# Patient Record
Sex: Female | Born: 1955 | Race: White | Hispanic: No | Marital: Married | Smoking: Never smoker
Health system: Southern US, Community
[De-identification: ages and names within clinical notes are randomized; demographics above are authoritative.]

## PROBLEM LIST (undated history)

## (undated) DIAGNOSIS — K635 Polyp of colon: Secondary | ICD-10-CM

## (undated) DIAGNOSIS — G4730 Sleep apnea, unspecified: Secondary | ICD-10-CM

## (undated) DIAGNOSIS — M5124 Other intervertebral disc displacement, thoracic region: Secondary | ICD-10-CM

## (undated) DIAGNOSIS — J40 Bronchitis, not specified as acute or chronic: Secondary | ICD-10-CM

## (undated) DIAGNOSIS — J069 Acute upper respiratory infection, unspecified: Secondary | ICD-10-CM

## (undated) DIAGNOSIS — K5900 Constipation, unspecified: Secondary | ICD-10-CM

## (undated) DIAGNOSIS — M5126 Other intervertebral disc displacement, lumbar region: Secondary | ICD-10-CM

## (undated) DIAGNOSIS — M549 Dorsalgia, unspecified: Secondary | ICD-10-CM

## (undated) DIAGNOSIS — K9041 Non-celiac gluten sensitivity: Secondary | ICD-10-CM

## (undated) DIAGNOSIS — M7989 Other specified soft tissue disorders: Secondary | ICD-10-CM

## (undated) DIAGNOSIS — H8110 Benign paroxysmal vertigo, unspecified ear: Secondary | ICD-10-CM

## (undated) DIAGNOSIS — K589 Irritable bowel syndrome without diarrhea: Secondary | ICD-10-CM

## (undated) DIAGNOSIS — E559 Vitamin D deficiency, unspecified: Secondary | ICD-10-CM

## (undated) DIAGNOSIS — J45909 Unspecified asthma, uncomplicated: Secondary | ICD-10-CM

## (undated) DIAGNOSIS — B019 Varicella without complication: Secondary | ICD-10-CM

## (undated) DIAGNOSIS — K648 Other hemorrhoids: Secondary | ICD-10-CM

## (undated) DIAGNOSIS — M797 Fibromyalgia: Secondary | ICD-10-CM

## (undated) DIAGNOSIS — K5792 Diverticulitis of intestine, part unspecified, without perforation or abscess without bleeding: Secondary | ICD-10-CM

## (undated) DIAGNOSIS — E538 Deficiency of other specified B group vitamins: Secondary | ICD-10-CM

## (undated) DIAGNOSIS — K219 Gastro-esophageal reflux disease without esophagitis: Secondary | ICD-10-CM

## (undated) DIAGNOSIS — M2550 Pain in unspecified joint: Secondary | ICD-10-CM

## (undated) DIAGNOSIS — M1990 Unspecified osteoarthritis, unspecified site: Secondary | ICD-10-CM

## (undated) DIAGNOSIS — M8580 Other specified disorders of bone density and structure, unspecified site: Secondary | ICD-10-CM

## (undated) DIAGNOSIS — R61 Generalized hyperhidrosis: Secondary | ICD-10-CM

## (undated) DIAGNOSIS — I639 Cerebral infarction, unspecified: Secondary | ICD-10-CM

## (undated) DIAGNOSIS — R002 Palpitations: Secondary | ICD-10-CM

## (undated) DIAGNOSIS — E28319 Asymptomatic premature menopause: Secondary | ICD-10-CM

## (undated) DIAGNOSIS — T7840XA Allergy, unspecified, initial encounter: Secondary | ICD-10-CM

## (undated) HISTORY — DX: Other intervertebral disc displacement, thoracic region: M51.24

## (undated) HISTORY — DX: Other specified disorders of bone density and structure, unspecified site: M85.80

## (undated) HISTORY — DX: Other hemorrhoids: K64.8

## (undated) HISTORY — DX: Unspecified asthma, uncomplicated: J45.909

## (undated) HISTORY — DX: Constipation, unspecified: K59.00

## (undated) HISTORY — DX: Vitamin D deficiency, unspecified: E55.9

## (undated) HISTORY — DX: Polyp of colon: K63.5

## (undated) HISTORY — DX: Other intervertebral disc displacement, lumbar region: M51.26

## (undated) HISTORY — DX: Varicella without complication: B01.9

## (undated) HISTORY — DX: Asymptomatic premature menopause: E28.319

## (undated) HISTORY — DX: Gastro-esophageal reflux disease without esophagitis: K21.9

## (undated) HISTORY — DX: Acute upper respiratory infection, unspecified: J06.9

## (undated) HISTORY — DX: Allergy, unspecified, initial encounter: T78.40XA

## (undated) HISTORY — DX: Cerebral infarction, unspecified: I63.9

## (undated) HISTORY — DX: Irritable bowel syndrome, unspecified: K58.9

## (undated) HISTORY — DX: Palpitations: R00.2

## (undated) HISTORY — DX: Pain in unspecified joint: M25.50

## (undated) HISTORY — DX: Deficiency of other specified B group vitamins: E53.8

## (undated) HISTORY — PX: CARPAL TUNNEL RELEASE: SHX101

## (undated) HISTORY — PX: UPPER GASTROINTESTINAL ENDOSCOPY: SHX188

## (undated) HISTORY — DX: Dorsalgia, unspecified: M54.9

## (undated) HISTORY — DX: Other specified soft tissue disorders: M79.89

## (undated) HISTORY — DX: Generalized hyperhidrosis: R61

## (undated) HISTORY — DX: Unspecified osteoarthritis, unspecified site: M19.90

## (undated) HISTORY — DX: Diverticulitis of intestine, part unspecified, without perforation or abscess without bleeding: K57.92

## (undated) HISTORY — DX: Sleep apnea, unspecified: G47.30

## (undated) HISTORY — DX: Non-celiac gluten sensitivity: K90.41

## (undated) HISTORY — DX: Fibromyalgia: M79.7

## (undated) HISTORY — DX: Bronchitis, not specified as acute or chronic: J40

## (undated) HISTORY — DX: Benign paroxysmal vertigo, unspecified ear: H81.10

---

## 2006-03-18 DIAGNOSIS — J309 Allergic rhinitis, unspecified: Secondary | ICD-10-CM | POA: Insufficient documentation

## 2006-03-18 DIAGNOSIS — E28319 Asymptomatic premature menopause: Secondary | ICD-10-CM | POA: Insufficient documentation

## 2006-03-18 DIAGNOSIS — K449 Diaphragmatic hernia without obstruction or gangrene: Secondary | ICD-10-CM | POA: Insufficient documentation

## 2006-03-18 DIAGNOSIS — Z803 Family history of malignant neoplasm of breast: Secondary | ICD-10-CM | POA: Insufficient documentation

## 2006-05-20 DIAGNOSIS — J019 Acute sinusitis, unspecified: Secondary | ICD-10-CM | POA: Insufficient documentation

## 2006-10-15 DIAGNOSIS — H8120 Vestibular neuronitis, unspecified ear: Secondary | ICD-10-CM | POA: Insufficient documentation

## 2010-12-27 DIAGNOSIS — B009 Herpesviral infection, unspecified: Secondary | ICD-10-CM | POA: Insufficient documentation

## 2011-04-16 HISTORY — PX: KNEE ARTHROSCOPY: SHX127

## 2013-04-15 HISTORY — PX: COLONOSCOPY: SHX174

## 2014-03-09 DIAGNOSIS — R0600 Dyspnea, unspecified: Secondary | ICD-10-CM | POA: Insufficient documentation

## 2014-03-09 DIAGNOSIS — R030 Elevated blood-pressure reading, without diagnosis of hypertension: Secondary | ICD-10-CM | POA: Insufficient documentation

## 2014-04-06 DIAGNOSIS — E538 Deficiency of other specified B group vitamins: Secondary | ICD-10-CM | POA: Insufficient documentation

## 2014-12-05 DIAGNOSIS — R635 Abnormal weight gain: Secondary | ICD-10-CM | POA: Insufficient documentation

## 2015-01-05 ENCOUNTER — Encounter: Payer: Self-pay | Admitting: Obstetrics & Gynecology

## 2015-04-16 DIAGNOSIS — I639 Cerebral infarction, unspecified: Secondary | ICD-10-CM

## 2015-04-16 HISTORY — DX: Cerebral infarction, unspecified: I63.9

## 2015-05-27 DIAGNOSIS — R399 Unspecified symptoms and signs involving the genitourinary system: Secondary | ICD-10-CM | POA: Insufficient documentation

## 2015-05-29 DIAGNOSIS — R21 Rash and other nonspecific skin eruption: Secondary | ICD-10-CM | POA: Insufficient documentation

## 2015-06-08 DIAGNOSIS — M792 Neuralgia and neuritis, unspecified: Secondary | ICD-10-CM | POA: Insufficient documentation

## 2015-06-08 HISTORY — DX: Neuralgia and neuritis, unspecified: M79.2

## 2015-08-11 DIAGNOSIS — M542 Cervicalgia: Secondary | ICD-10-CM | POA: Insufficient documentation

## 2015-08-11 DIAGNOSIS — R42 Dizziness and giddiness: Secondary | ICD-10-CM

## 2015-08-11 HISTORY — DX: Dizziness and giddiness: R42

## 2015-09-08 DIAGNOSIS — R002 Palpitations: Secondary | ICD-10-CM | POA: Insufficient documentation

## 2015-09-28 DIAGNOSIS — R29898 Other symptoms and signs involving the musculoskeletal system: Secondary | ICD-10-CM

## 2015-09-28 HISTORY — DX: Other symptoms and signs involving the musculoskeletal system: R29.898

## 2015-11-23 DIAGNOSIS — K9041 Non-celiac gluten sensitivity: Secondary | ICD-10-CM | POA: Insufficient documentation

## 2015-11-25 DIAGNOSIS — D7589 Other specified diseases of blood and blood-forming organs: Secondary | ICD-10-CM | POA: Insufficient documentation

## 2016-04-15 HISTORY — PX: NECK SURGERY: SHX720

## 2017-09-29 DIAGNOSIS — Z981 Arthrodesis status: Secondary | ICD-10-CM | POA: Insufficient documentation

## 2017-12-17 ENCOUNTER — Encounter: Payer: Self-pay | Admitting: Internal Medicine

## 2018-01-13 ENCOUNTER — Encounter (INDEPENDENT_AMBULATORY_CARE_PROVIDER_SITE_OTHER): Payer: Self-pay

## 2018-01-13 ENCOUNTER — Encounter: Payer: Self-pay | Admitting: Internal Medicine

## 2018-01-13 ENCOUNTER — Ambulatory Visit (INDEPENDENT_AMBULATORY_CARE_PROVIDER_SITE_OTHER): Payer: Managed Care, Other (non HMO) | Admitting: Internal Medicine

## 2018-01-13 VITALS — BP 122/72 | HR 80 | Ht 65.0 in | Wt 180.2 lb

## 2018-01-13 DIAGNOSIS — K581 Irritable bowel syndrome with constipation: Secondary | ICD-10-CM

## 2018-01-13 DIAGNOSIS — K9041 Non-celiac gluten sensitivity: Secondary | ICD-10-CM | POA: Diagnosis not present

## 2018-01-13 DIAGNOSIS — Z86010 Personal history of colonic polyps: Secondary | ICD-10-CM | POA: Diagnosis not present

## 2018-01-13 DIAGNOSIS — R198 Other specified symptoms and signs involving the digestive system and abdomen: Secondary | ICD-10-CM

## 2018-01-13 NOTE — Patient Instructions (Addendum)
We are going to get your records from [facility] for review.   We are referring you for pelvic floor physical therapy. They will contact you about an appointment.   Today we are giving you a handout to read and follow on [employer].    I appreciate the opportunity to care for you. Silvano Rusk, MD, [facility]

## 2018-01-13 NOTE — Progress Notes (Signed)
Kayla Rosales 62 y.o. 11-21-1955 [id number]  Assessment & Plan:   Encounter Diagnoses  Name Primary?  . Irritable bowel syndrome with constipation Yes  . Straining with stools   . Non-celiac gluten sensitivity   . History of colonic polyp    Sounds like may have pelvic floor/outlet dysfunction so will refer to opelvic PT  Benefiber at least 1 tbsp a day  Review colonoscopy pathology report and EGD path reports from 2015 ? If had pre-cancerous colon polyp - might be due/overdue for surveillance - 2015 report did not characterize the size of polyp and path not seen in Care Everywhere  Follow-up will be determined after review  Orders Placed This Encounter  Procedures  . Ambulatory referral to Physical Therapy   I appreciate the opportunity to care for this patient.  FV:CBSWHQ, [name], DO  Subjective:   Chief Complaint: constipation, straining to stool  HPI 62 yo ww self-referred because of constipation, bloating and straining to stool. Hx of problems in the past and was seen in [place], [place] (recently moved here to be closer to elderly father in [place], [place]) and started pelvic PT with help but had a neuro event and stopped (old stroke age indeterninate on CT/MR)   She describes "a lot of bloating", post-prandial urge to defecate but only small amounts of stool. + incomplete defecation. Has a gluten sensitivuty dx - says does well off gluten but never had celiac testing because was off gluten so GI MD did not test. Has stayed gluten free because feels better. 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 Does bloat now but not like that. Weight watchers diet has been helpful for constipation in past when used. Eats "a lot of fruit - all types"  Bowel habits can be variable - and hen travels gets constipated. At home 5x/day often but small and incomplete. Sig straining to  stool at times. No bleeding,  GERD sxs - heartburn - effective control w/ Nexium  Prior EGD's/colonoscopy in [place]. [name]  Colonoscopy 2015 hot snare transverse polyp Pathology not seen in Care Everywhere EGD also 2015 [facility] - duodenal bxs path not seen 2017 visit [name] - Abd wall pain, aerophagia, gluten intolerance, outlet dysfx constipation, - sent to pelvic PT, stay on MiraLax  Allergies  Allergen Reactions  . Hydromorphone Other (See Comments)    HYPOTENSION  . Meperidine Other (See Comments)    "BP drops"  . Sulfa Antibiotics Other (See Comments)    States she does not know the reaction    Current Meds  Medication Sig  . aspirin EC 81 MG tablet Take 81 mg by mouth daily.  . Cholecalciferol (D3-1000) 1000 units capsule Take 1,000 Units by mouth daily.  . cyanocobalamin 1000 MCG tablet Take 1,000 mcg by mouth daily.  . fluticasone (FLONASE) 50 MCG/ACT nasal spray Place into both nostrils as needed for allergies or rhinitis.  Marland Kitchen loratadine-pseudoephedrine (CLARITIN-D 24-HOUR) 10-240 MG 24 hr tablet Take 1 tablet by mouth daily.  . Multiple Vitamins-Minerals (ALIVE WOMENS 50+ PO) Take by mouth daily.  . Omega-3 Fatty Acids (FISH OIL) 1000 MG CAPS Take by mouth daily.  . vitamin E (VITAMIN E) 200 UNIT capsule Take 200 Units by mouth daily.  . [DISCONTINUED] albuterol (PROVENTIL HFA;VENTOLIN HFA) 108 (90 Base) MCG/ACT inhaler Inhale 1-2 puffs into the lungs every 6 (six) hours as needed for  wheezing or shortness of breath.  . [DISCONTINUED] atorvastatin (LIPITOR) 40 MG tablet Take 40 mg by mouth at bedtime.  . [DISCONTINUED] esomeprazole (NEXIUM) 40 MG capsule Take 40 mg by mouth daily at 12 noon.   Past Medical History:  Diagnosis Date  . Allergy   . Asthma   . Chicken pox   . Colon polyps   . Diverticulitis   . GERD (gastroesophageal reflux disease)   . Stroke (cerebrum) ([place]) 2017   right UE/neck sx, resolved.    Past Surgical History:  Procedure Laterality  Date  . COLONOSCOPY  2015   [name]- [place], [place] Bilateral    meniscus  . NECK SURGERY  2018  . UPPER GASTROINTESTINAL ENDOSCOPY     Social History   Social History Narrative   Married second family - 41 kids 43 grandkids blended   Retired Pharmacist, hospital   Caffeine 2/day   Smoker - never   EtOH - rare   No drugs   family history includes Arthritis in her father; COPD in her sister; Depression in her mother; Depression (age of onset: 37) in her sister; Diabetes in her maternal grandmother; Drug abuse in her sister; Early death in her maternal grandfather, maternal grandmother, and sister; Early death (age of onset: 34) in her mother; Heart disease in her father, maternal grandfather, and mother; Hypertension in her mother; Lung cancer in her sister; Mental illness in her sister.   Review of Systems   Objective:   Physical Exam

## 2018-01-15 ENCOUNTER — Encounter: Payer: Self-pay | Admitting: Family Medicine

## 2018-01-15 ENCOUNTER — Ambulatory Visit (INDEPENDENT_AMBULATORY_CARE_PROVIDER_SITE_OTHER): Payer: Managed Care, Other (non HMO) | Admitting: Family Medicine

## 2018-01-15 VITALS — BP 118/72 | HR 62 | Temp 97.8°F | Resp 20 | Ht 65.0 in | Wt 178.4 lb

## 2018-01-15 DIAGNOSIS — Z79899 Other long term (current) drug therapy: Secondary | ICD-10-CM | POA: Diagnosis not present

## 2018-01-15 DIAGNOSIS — M25531 Pain in right wrist: Secondary | ICD-10-CM

## 2018-01-15 DIAGNOSIS — Z23 Encounter for immunization: Secondary | ICD-10-CM | POA: Diagnosis not present

## 2018-01-15 DIAGNOSIS — Z130 Encounter for screening for diseases of the blood and blood-forming organs and certain disorders involving the immune mechanism: Secondary | ICD-10-CM

## 2018-01-15 DIAGNOSIS — K219 Gastro-esophageal reflux disease without esophagitis: Secondary | ICD-10-CM | POA: Diagnosis not present

## 2018-01-15 DIAGNOSIS — Z7689 Persons encountering health services in other specified circumstances: Secondary | ICD-10-CM | POA: Diagnosis not present

## 2018-01-15 DIAGNOSIS — J45909 Unspecified asthma, uncomplicated: Secondary | ICD-10-CM | POA: Insufficient documentation

## 2018-01-15 DIAGNOSIS — J4520 Mild intermittent asthma, uncomplicated: Secondary | ICD-10-CM | POA: Diagnosis not present

## 2018-01-15 DIAGNOSIS — Z8673 Personal history of transient ischemic attack (TIA), and cerebral infarction without residual deficits: Secondary | ICD-10-CM | POA: Insufficient documentation

## 2018-01-15 DIAGNOSIS — E28319 Asymptomatic premature menopause: Secondary | ICD-10-CM

## 2018-01-15 DIAGNOSIS — Z01419 Encounter for gynecological examination (general) (routine) without abnormal findings: Secondary | ICD-10-CM

## 2018-01-15 LAB — COMPREHENSIVE METABOLIC PANEL
ALK PHOS: 88 U/L (ref 39–117)
ALT: 19 U/L (ref 0–35)
AST: 18 U/L (ref 0–37)
Albumin: 4.2 g/dL (ref 3.5–5.2)
BILIRUBIN TOTAL: 1.1 mg/dL (ref 0.2–1.2)
BUN: 15 mg/dL (ref 6–23)
CALCIUM: 9.3 mg/dL (ref 8.4–10.5)
CO2: 32 mEq/L (ref 19–32)
Chloride: 104 mEq/L (ref 96–112)
Creatinine, Ser: 0.66 mg/dL (ref 0.40–1.20)
GFR: 96.13 mL/min (ref >60.00)
Glucose, Bld: 96 mg/dL (ref 70–99)
POTASSIUM: 4.4 meq/L (ref 3.5–5.1)
Sodium: 139 mEq/L (ref 135–145)
TOTAL PROTEIN: 6.6 g/dL (ref 6.0–8.3)

## 2018-01-15 LAB — CBC WITH DIFFERENTIAL/PLATELET
BASOS ABS: 0.1 10*3/uL (ref 0.0–0.1)
Basophils Relative: 1.2 % (ref 0.0–3.0)
Eosinophils Absolute: 0.1 10*3/uL (ref 0.0–0.7)
Eosinophils Relative: 2.3 % (ref 0.0–5.0)
HCT: 43.2 % (ref 36.0–46.0)
Hemoglobin: 14.6 g/dL (ref 12.0–15.0)
LYMPHS ABS: 1.2 10*3/uL (ref 0.7–4.0)
Lymphocytes Relative: 23.7 % (ref 12.0–46.0)
MCHC: 33.8 g/dL (ref 30.0–36.0)
MCV: 100.8 fl — AB (ref 78.0–100.0)
MONOS PCT: 6.5 % (ref 3.0–12.0)
Monocytes Absolute: 0.3 10*3/uL (ref 0.1–1.0)
NEUTROS ABS: 3.3 10*3/uL (ref 1.4–7.7)
Neutrophils Relative %: 66.3 % (ref 43.0–77.0)
PLATELETS: 221 10*3/uL (ref 150.0–400.0)
RBC: 4.28 Mil/uL (ref 3.87–5.11)
RDW: 13 % (ref 11.5–15.5)
WBC: 5 10*3/uL (ref 4.0–10.5)

## 2018-01-15 MED ORDER — ESOMEPRAZOLE MAGNESIUM 40 MG PO CPDR: 40.0000 mg | DELAYED_RELEASE_CAPSULE | Freq: Every day | ORAL | 1 refills | Status: DC

## 2018-01-15 MED ORDER — ALBUTEROL SULFATE HFA 108 (90 BASE) MCG/ACT IN AERS: 1.0000 | INHALATION_SPRAY | Freq: Four times a day (QID) | RESPIRATORY_TRACT | 5 refills | Status: DC | PRN

## 2018-01-15 MED ORDER — ATORVASTATIN CALCIUM 40 MG PO TABS: 40.0000 mg | ORAL_TABLET | Freq: Every day | ORAL | 3 refills | Status: DC

## 2018-01-15 NOTE — Patient Instructions (Addendum)
It was a pleasure meeting you today.  I have refilled your meds for you today. Follow up yearly at time of physical and we will do labs and refill meds. Follow up sooner if needed.   Please help [place] help you:  We are honored you have chosen [facility] for your Primary Care home. Below you will find basic instructions that you may need to access in the future. Please help [place] help you by reading the instructions, which cover many of the frequent questions we experience.   Prescription refills and request:  -In order to allow more efficient response time, please call your pharmacy for all refills. They will forward the request electronically to [place]. This allows for the quickest possible response. Request left on a nurse line can take longer to refill, since these are checked as time allows between office patients and other phone calls.  - refill request can take up to 3-5 working days to complete.  - If request is sent electronically and request is appropiate, it is usually completed in 1-2 business days.  - all patients will need to be seen routinely for all chronic medical conditions requiring prescription medications (see follow-up below). If you are overdue for follow up on your condition, you will be asked to make an appointment and we will call in enough medication to cover you until your appointment (up to 30 days).  - all controlled substances will require a face to face visit to request/refill.  - if you desire your prescriptions to go through a new pharmacy, and have an active script at original pharmacy, you will need to call your pharmacy and have scripts transferred to new pharmacy. This is completed between the pharmacy locations and not by your provider.    Results: If any images or labs were ordered, it can take up to 1 week to get results depending on the test ordered and the lab/facility running and resulting the test. - Normal or stable results, which do not need further  discussion, may be released to your mychart immediately with attached note to you. A call may not be generated for normal results. Please make certain to sign up for mychart. If you have questions on how to activate your mychart you can call the front office.  - If your results need further discussion, our office will attempt to contact you via phone, and if unable to reach you after 2 attempts, we will release your abnormal result to your mychart with instructions.  - All results will be automatically released in mychart after 1 week.  - Your provider will provide you with explanation and instruction on all relevant material in your results. Please keep in mind, results and labs may appear confusing or abnormal to the untrained eye, but it does not mean they are actually abnormal for you personally. If you have any questions about your results that are not covered, or you desire more detailed explanation than what was provided, you should make an appointment with your provider to do so.   Our office handles many outgoing and incoming calls daily. If we have not contacted you within 1 week about your results, please check your mychart to see if there is a message first and if not, then contact our office.  In helping with this matter, you help decrease call volume, and therefore allow [place] to be able to respond to patients needs more efficiently.   Acute office visits (sick visit):  An acute visit  is intended for a new problem and are scheduled in shorter time slots to allow schedule openings for patients with new problems. This is the appropriate visit to discuss a new problem. Problems will not be addressed by phone call or Echart message. Appointment is needed if requesting treatment. In order to provide you with excellent quality medical care with proper time for you to explain your problem, have an exam and receive treatment with instructions, these appointments should be limited to one new problem per  visit. If you experience a new problem, in which you desire to be addressed, please make an acute office visit, we save openings on the schedule to accommodate you. Please do not save your new problem for any other type of visit, let us take care of it properly and quickly for you.   Follow up visits:  Depending on your condition(s) your provider will need to see you routinely in order to provide you with quality care and prescribe medication(s). Most chronic conditions (Example: hypertension, Diabetes, depression/anxiety... etc), require visits a couple times a year. Your provider will instruct you on proper follow up for your personal medical conditions and history. Please make certain to make follow up appointments for your condition as instructed. Failing to do so could result in lapse in your medication treatment/refills. If you request a refill, and are overdue to be seen on a condition, we will always provide you with a 30 day script (once) to allow you time to schedule.    Medicare wellness (well visit): - we have a wonderful Nurse Maudie Mercury), that will meet with you and provide you will yearly medicare wellness visits. These visits should occur yearly (can not be scheduled less than 1 calendar year apart) and cover preventive health, immunizations, advance directives and screenings you are entitled to yearly through your medicare benefits. Do not miss out on your entitled benefits, this is when medicare will pay for these benefits to be ordered for you.  These are strongly encouraged by your provider and is the appropriate type of visit to make certain you are up to date with all preventive health benefits. If you have not had your medicare wellness exam in the last 12 months, please make certain to schedule one by calling the office and schedule your medicare wellness with Maudie Mercury as soon as possible.   Yearly physical (well visit):  - Adults are recommended to be seen yearly for physicals. Check with  your insurance and date of your last physical, most insurances require one calendar year between physicals. Physicals include all preventive health topics, screenings, medical exam and labs that are appropriate for gender/age and history. You may have fasting labs needed at this visit. This is a well visit (not a sick visit), new problems should not be covered during this visit (see acute visit).  - Pediatric patients are seen more frequently when they are younger. Your provider will advise you on well child visit timing that is appropriate for your their age. - This is not a medicare wellness visit. Medicare wellness exams do not have an exam portion to the visit. Some medicare companies allow for a physical, some do not allow a yearly physical. If your medicare allows a yearly physical you can schedule the medicare wellness with our nurse Maudie Mercury and have your physical with your provider after, on the same day. Please check with insurance for your full benefits.   Late Policy/No Shows:  - all new patients should arrive 15-30 minutes earlier  than appointment to allow [place] time  to  obtain all personal demographics,  insurance information and for you to complete office paperwork. - All established patients should arrive 10-15 minutes earlier than appointment time to update all information and be checked in .  - In our best efforts to run on time, if you are late for your appointment you will be asked to either reschedule or if able, we will work you back into the schedule. There will be a wait time to work you back in the schedule,  depending on availability.  - If you are unable to make it to your appointment as scheduled, please call 24 hours ahead of time to allow [place] to fill the time slot with someone else who needs to be seen. If you do not cancel your appointment ahead of time, you may be charged a no show fee.

## 2018-01-15 NOTE — Progress Notes (Signed)
Patient ID: Kayla Rosales, female  DOB: 04-09-1956, 62 y.o.   MRN: [id number] Patient Care Team    Relationship Specialty Notifications Start End  Ma Hillock, DO PCP - General Family Medicine  01/15/18   Gatha Mayer, MD Consulting Physician Gastroenterology  01/19/18     Chief Complaint  Patient presents with  . Establish Care    Subjective:  Kayla Rosales is a 62 y.o.  female present for new patient establishment. All past medical history, surgical history, allergies, family history, immunizations, medications and social history were updated in the electronic medical record today. All recent labs, ED visits and hospitalizations within the last year were reviewed.  Patient presents today for new patient establishment.  She recently moved from [place] area to be closer to her family.  statin therapy/History of stroke Patient reports history of stroke in 2017 started with right upper extremity neurological symptoms.  She was evaluated by neurology.  Currently takes daily fish oil, daily baby aspirin, and atorvastatin 40 mg daily.  Gastroesophageal reflux disease, esophagitis presence not specified Stable on current nexium. Long term use.   Mild intermittent asthma, unspecified whether complicated Stable.  Rarely needs albuterol.    Early menopause occurring in patient age younger than 56 years/osteopenia/PAP She reports early menopause at age 74.  She had been on estrogen therapy however she sustained a stroke in 2017 and removed off all estrogen replacement.  She has a history of osteopenia by DEXA 2016.  She will need routine gynecological care and asking for referral today.   Depression screen PHQ 2/9 01/15/2018  Decreased Interest 0  Down, Depressed, Hopeless 0  PHQ - 2 Score 0   No flowsheet data found. Current Exercise Habits: The patient does not participate in regular exercise at present Exercise limited by: None identified Fall Risk  01/15/2018  Falls in  the past year? No    Immunization History  Administered Date(s) Administered  . Influenza Inj Mdck Quad Pf 03/27/2017  . Influenza, High Dose Seasonal PF 01/15/2018  . Influenza-Unspecified 01/21/2016  . Pneumococcal Polysaccharide-23 08/13/2015  . Tdap 12/30/2011, 01/21/2016    No exam data present  Past Medical History:  Diagnosis Date  . Allergy   . Arthritis   . Asthma   . Chicken pox   . Colon polyps   . Diverticulitis   . Early menopause occurring in patient age younger than 42 years   . GERD (gastroesophageal reflux disease)   . 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 IBS (irritable bowel syndrome)   . Lumbar herniated disc   . Stroke (cerebrum) ([place]) 2017   right UE/neck sx, resolved.    Allergies  Allergen Reactions  . Hydromorphone Other (See Comments)    HYPOTENSION  . Meperidine Other (See Comments)    "BP drops"  . Sulfa Antibiotics Other (See Comments)    States she does not know the reaction    Past Surgical History:  Procedure Laterality Date  . COLONOSCOPY  2015   [name]- [place], [place]; 5 yr repeat recommended  . KNEE ARTHROSCOPY Bilateral 2013   meniscus  x2  . NECK SURGERY  2018   disc fusion  . UPPER GASTROINTESTINAL ENDOSCOPY     Family History  Problem Relation Age of Onset  . Depression Mother   . Heart disease Mother   . Early death Mother 31  . Hypertension Mother   . Arthritis Father   . Heart disease Father   .  Depression Sister 74  . Early death Sister   . Drug abuse Sister   . Lung cancer Sister   . Mental illness Sister   . COPD Sister   . Diabetes Maternal Grandmother   . Early death Maternal Grandmother   . Early death Maternal Grandfather   . Heart disease Maternal Grandfather    Social History   Socioeconomic History  . Marital status: Married    Spouse name: Not on file  . Number of children: 3  . Years of education: Not on file  . Highest education level: Not on file  Occupational History  .  Occupation: retired Tour manager  . Financial resource strain: Not on file  . Food insecurity:    Worry: Not on file    Inability: Not on file  . Transportation needs:    Medical: Not on file    Non-medical: Not on file  Tobacco Use  . Smoking status: Never Smoker  . Smokeless tobacco: Never Used  Substance and Sexual Activity  . Alcohol use: Yes    Alcohol/week: 1.0 standard drinks    Types: 1 Glasses of wine per week    Comment: 1/2-1 per day  . Drug use: Never  . Sexual activity: Yes    Partners: Male    Comment: married  Lifestyle  . Physical activity:    Days per week: Not on file    Minutes per session: Not on file  . Stress: Not on file  Relationships  . Social connections:    Talks on phone: Not on file    Gets together: Not on file    Attends religious service: Not on file    Active member of club or organization: Not on file    Attends meetings of clubs or organizations: Not on file    Relationship status: Not on file  . Intimate partner violence:    Fear of current or ex partner: Not on file    Emotionally abused: Not on file    Physically abused: Not on file    Forced sexual activity: Not on file  Other Topics Concern  . Not on file  Social History Narrative   Married second family - 55 kids 9 grandkids blended   Haematologist, retired Pharmacist, hospital   Caffeine 2/day   Smoker - never   EtOH - rare   No drugs   Wears her seatbelt, smoke alarm at home.   Feels safe in her relationships.   Allergies as of 01/15/2018      Reactions   Hydromorphone Other (See Comments)   HYPOTENSION   Meperidine Other (See Comments)   "BP drops"   Sulfa Antibiotics Other (See Comments)   States she does not know the reaction      Medication List        Accurate as of 01/15/18 11:59 PM. Always use your most recent med list.          albuterol 108 (90 Base) MCG/ACT inhaler Commonly known as:  PROVENTIL HFA;VENTOLIN HFA Inhale 1-2 puffs into the lungs  every 6 (six) hours as needed for wheezing or shortness of breath.   ALIVE WOMENS 50+ PO Take by mouth daily.   aspirin EC 81 MG tablet Take 81 mg by mouth daily.   atorvastatin 40 MG tablet Commonly known as:  LIPITOR Take 1 tablet (40 mg total) by mouth at bedtime.   cyanocobalamin 1000 MCG tablet Take 1,000 mcg by mouth daily.  D3-1000 1000 units capsule Generic drug:  Cholecalciferol Take 1,000 Units by mouth daily.   esomeprazole 40 MG capsule Commonly known as:  NEXIUM Take 1 capsule (40 mg total) by mouth daily at 12 noon.   Fish Oil 1000 MG Caps Take by mouth daily.   fluticasone 50 MCG/ACT nasal spray Commonly known as:  FLONASE Place into both nostrils as needed for allergies or rhinitis.   loratadine-pseudoephedrine 10-240 MG 24 hr tablet Commonly known as:  CLARITIN-D 24-hour Take 1 tablet by mouth daily.   vitamin E 200 UNIT capsule Generic drug:  vitamin E Take 200 Units by mouth daily.       All past medical history, surgical history, allergies, family history, immunizations andmedications were updated in the EMR today and reviewed under the history and medication portions of their EMR.    Recent Results (from the past 2160 hour(s))  CBC w/Diff     Status: Abnormal   Collection Time: 01/15/18 10:07 AM  Result Value Ref Range   WBC 5.0 4.0 - 10.5 K/uL   RBC 4.28 3.87 - 5.11 Mil/uL   Hemoglobin 14.6 12.0 - 15.0 g/dL   HCT 43.2 36.0 - 46.0 %   MCV 100.8 (H) 78.0 - 100.0 fl   MCHC 33.8 30.0 - 36.0 g/dL   RDW 13.0 11.5 - 15.5 %   Platelets 221.0 150.0 - 400.0 K/uL   Neutrophils Relative % 66.3 43.0 - 77.0 %   Lymphocytes Relative 23.7 12.0 - 46.0 %   Monocytes Relative 6.5 3.0 - 12.0 %   Eosinophils Relative 2.3 0.0 - 5.0 %   Basophils Relative 1.2 0.0 - 3.0 %   Neutro Abs 3.3 1.4 - 7.7 K/uL   Lymphs Abs 1.2 0.7 - 4.0 K/uL   Monocytes Absolute 0.3 0.1 - 1.0 K/uL   Eosinophils Absolute 0.1 0.0 - 0.7 K/uL   Basophils Absolute 0.1 0.0 - 0.1 K/uL    Comp Met (CMET)     Status: None   Collection Time: 01/15/18 10:07 AM  Result Value Ref Range   Sodium 139 135 - 145 mEq/L   Potassium 4.4 3.5 - 5.1 mEq/L   Chloride 104 96 - 112 mEq/L   CO2 32 19 - 32 mEq/L   Glucose, Bld 96 70 - 99 mg/dL   BUN 15 6 - 23 mg/dL   Creatinine, Ser 0.66 0.40 - 1.20 mg/dL   Total Bilirubin 1.1 0.2 - 1.2 mg/dL   Alkaline Phosphatase 88 39 - 117 U/L   AST 18 0 - 37 U/L   ALT 19 0 - 35 U/L   Total Protein 6.6 6.0 - 8.3 g/dL   Albumin 4.2 3.5 - 5.2 g/dL   Calcium 9.3 8.4 - 10.5 mg/dL   GFR 96.13 >60.00 mL/min    Patient was never admitted.   ROS: 14 pt review of systems performed and negative (unless mentioned in an HPI)  Objective: BP 118/72 (BP Location: Right Arm, Patient Position: Sitting, Cuff Size: Large)   Pulse 62   Temp 97.8 F (36.6 C)   Resp 20   Ht _0  (1.651 m)   Wt 178 lb 6 oz (80.9 kg)   SpO2 98%   BMI 29.68 kg/m  Gen: Afebrile. No acute distress. Nontoxic in appearance, well-developed, well-nourished, does not Caucasian female. HENT: AT. [place].  MMM Eyes:Pupils Equal Round Reactive to light, Extraocular movements intact,  Conjunctiva without redness, discharge or icterus. Neck/lymp/endocrine: Supple, no lymphadenopathy, no thyromegaly CV: RRR no murmur, no  edema, +2/4 P posterior tibialis pulses.  No carotid bruits. No JVD. Chest: CTAB, no wheeze, rhonchi or crackles. normal Respiratory effort. good Air movement. Abd: Soft. NTND. BS sent.  No masses palpated. No hepatosplenomegaly. No rebound tenderness or guarding. Skin: No rashes, purpura or petechiae. Warm and well-perfused. Skin intact. Neuro/Msk:  Normal gait. PERLA. EOMi. Alert. Oriented x3.  Cranial nerves II through XII intact. Muscle strength 5/5 upper/lower extremity. DTRs equal bilaterally. Psych: Normal affect, dress and demeanor. Normal speech. Normal thought content and judgment.   Assessment/plan: Shley Dolby is a 62 y.o. female present for patient  establishment. statin therapy/History of stroke Patient reports history of stroke in 2017 started with right upper extremity neurological symptoms.  She was evaluated by neurology.  Currently takes daily fish oil, daily baby aspirin, and atorvastatin 40 mg daily.  Refills provided today. -Reviewed labs available in care everywhere. -cmp , CBC ordered today.  Gastroesophageal reflux disease, esophagitis presence not specified Stable on current nexium. Long term use.  -Refills provided today.  Consider vitamin D, magnesium and B12 screening every 3 years.  Mild intermittent asthma, unspecified whether complicated Stable.  Rarely needs albuterol.  -Refills provided today.   Early menopause occurring in patient age younger than 22 years/osteopenia/PAP She reports early menopause at age 44.   -Referred her to gynecology so she could establish locally.  Return if symptoms worsen or fail to improve. Yearly for CPE   Note is dictated utilizing voice recognition software. Although note has been proof read prior to signing, occasional typographical errors still can be missed. If any questions arise, please do not hesitate to call for verification.  Electronically signed by: Howard Pouch, DO [place]

## 2018-01-17 DIAGNOSIS — Z860100 Personal history of colon polyps, unspecified: Secondary | ICD-10-CM | POA: Insufficient documentation

## 2018-01-17 DIAGNOSIS — K581 Irritable bowel syndrome with constipation: Secondary | ICD-10-CM | POA: Insufficient documentation

## 2018-01-17 DIAGNOSIS — K9041 Non-celiac gluten sensitivity: Secondary | ICD-10-CM | POA: Insufficient documentation

## 2018-01-19 ENCOUNTER — Telehealth: Payer: Self-pay | Admitting: Family Medicine

## 2018-01-19 ENCOUNTER — Encounter: Payer: Self-pay | Admitting: Family Medicine

## 2018-01-19 DIAGNOSIS — Z5181 Encounter for therapeutic drug level monitoring: Secondary | ICD-10-CM

## 2018-01-19 DIAGNOSIS — R202 Paresthesia of skin: Secondary | ICD-10-CM

## 2018-01-19 DIAGNOSIS — K219 Gastro-esophageal reflux disease without esophagitis: Secondary | ICD-10-CM

## 2018-01-19 DIAGNOSIS — M8580 Other specified disorders of bone density and structure, unspecified site: Secondary | ICD-10-CM | POA: Insufficient documentation

## 2018-01-19 DIAGNOSIS — D7589 Other specified diseases of blood and blood-forming organs: Secondary | ICD-10-CM

## 2018-01-19 DIAGNOSIS — E28319 Asymptomatic premature menopause: Secondary | ICD-10-CM | POA: Insufficient documentation

## 2018-01-19 DIAGNOSIS — Z79899 Other long term (current) drug therapy: Secondary | ICD-10-CM

## 2018-01-19 NOTE — Telephone Encounter (Signed)
Please inform patient the following information: Her electrolytes, liver and kidney function are normal.  Glucose is in normal range. She has mildly elevation in the size of her red blood cells called macrocytosis.  Again this is just mild, and typically is caused by either B12 deficiency, thyroid disorder and can even be from some alcohol use. I Have placed an order for testing on B12, this could be deficient secondary to her chronic gastritis/Nexium use, so check her thyroid by lab appt only.  If  these tests come back abnormal, will need to see in person to discuss.

## 2018-01-19 NOTE — Telephone Encounter (Signed)
Left detailed message with results and instructions on patient voice mail per DPR patient instructed to call back to schedule lab appt.

## 2018-01-23 ENCOUNTER — Other Ambulatory Visit: Payer: Managed Care, Other (non HMO)

## 2018-01-23 ENCOUNTER — Other Ambulatory Visit (INDEPENDENT_AMBULATORY_CARE_PROVIDER_SITE_OTHER): Payer: Managed Care, Other (non HMO)

## 2018-01-23 DIAGNOSIS — Z5181 Encounter for therapeutic drug level monitoring: Secondary | ICD-10-CM | POA: Diagnosis not present

## 2018-01-23 DIAGNOSIS — D7589 Other specified diseases of blood and blood-forming organs: Secondary | ICD-10-CM | POA: Diagnosis not present

## 2018-01-23 DIAGNOSIS — Z79899 Other long term (current) drug therapy: Secondary | ICD-10-CM

## 2018-01-23 DIAGNOSIS — R202 Paresthesia of skin: Secondary | ICD-10-CM | POA: Diagnosis not present

## 2018-01-23 DIAGNOSIS — K219 Gastro-esophageal reflux disease without esophagitis: Secondary | ICD-10-CM

## 2018-01-23 LAB — VITAMIN B12: VITAMIN B 12: 610 pg/mL (ref 211–911)

## 2018-01-23 LAB — TSH: TSH: 1.5 u[IU]/mL (ref 0.35–4.50)

## 2018-01-26 ENCOUNTER — Other Ambulatory Visit: Payer: Self-pay

## 2018-01-26 ENCOUNTER — Ambulatory Visit: Payer: Managed Care, Other (non HMO) | Attending: Internal Medicine | Admitting: Physical Therapy

## 2018-01-26 ENCOUNTER — Encounter: Payer: Self-pay | Admitting: Physical Therapy

## 2018-01-26 DIAGNOSIS — M6281 Muscle weakness (generalized): Secondary | ICD-10-CM | POA: Diagnosis present

## 2018-01-26 DIAGNOSIS — R279 Unspecified lack of coordination: Secondary | ICD-10-CM | POA: Diagnosis present

## 2018-01-26 NOTE — Patient Instructions (Addendum)
Bear Down    Contract the anal canal lying on side.  Exhaling, bear down as if to have a bowel movement. Repeat __5_ times. Do _2-3 __ times a day.  Copyright  VHI. All rights reserved.   Toileting Techniques for Bowel Movements (Defecation) Using your belly (abdomen) and pelvic floor muscles to have a bowel movement is usually instinctive.  Sometimes people can have problems with these muscles and have to relearn proper defecation (emptying) techniques.  If you have weakness in your muscles, organs that are falling out, decreased sensation in your pelvis, or ignore your urge to go, you may find yourself straining to have a bowel movement.  You are straining if you are: . holding your breath or taking in a huge gulp of air and holding it  . keeping your lips and jaw tensed and closed tightly . turning red in the face because of excessive pushing or forcing . developing or worsening your  hemorrhoids . getting faint while pushing . not emptying completely and have to defecate many times a day  If you are straining, you are actually making it harder for yourself to have a bowel movement.  Many people find they are pulling up with the pelvic floor muscles and closing off instead of opening the anus. Due to lack pelvic floor relaxation and coordination the abdominal muscles, one has to work harder to push the feces out.  Many people have never been taught how to defecate efficiently and effectively.  Notice what happens to your body when you are having a bowel movement.  While you are sitting on the toilet pay attention to the following areas: . Jaw and mouth position . Angle of your hips   . Whether your feet touch the ground or not . Arm placement  . Spine position . Waist . Belly tension . Anus (opening of the anal canal)  An Evacuation/Defecation Plan   Here are the 4 basic points:  1. Lean forward enough for your elbows to rest on your knees 2. Support your feet on the floor or  use a low stool if your feet don't touch the floor  3. Push out your belly as if you have swallowed a beach ball-you should feel a widening of your waist 4. Open and relax your pelvic floor muscles, rather than tightening around the anus      The following conditions my require modifications to your toileting posture:  . If you have had surgery in the past that limits your back, hip, pelvic, knee or ankle flexibility . Constipation   Your healthcare practitioner may make the following additional suggestions and adjustments:  1) Sit on the toilet  a) Make sure your feet are supported. b) Notice your hip angle and spine position-most people find it effective to lean forward or raise their knees, which can help the muscles around the anus to relax  c) When you lean forward, place your forearms on your thighs for support  2) Relax suggestions a) Breath deeply in through your nose and out slowly through your mouth as if you are smelling the flowers and blowing out the candles. b) To become aware of how to relax your muscles, contracting and releasing muscles can be helpful.  Pull your pelvic floor muscles in tightly by using the image of holding back gas, or closing around the anus (visualize making a circle smaller) and lifting the anus up and in.  Then release the muscles and your anus should drop  down and feel open. Repeat 5 times ending with the feeling of relaxation. c) Keep your pelvic floor muscles relaxed; let your belly bulge out. d) The digestive tract starts at the mouth and ends at the anal opening, so be sure to relax both ends of the tube.  Place your tongue on the roof of your mouth with your teeth separated.  This helps relax your mouth and will help to relax the anus at the same time.  3) Empty (defecation) a) Keep your pelvic floor and sphincter relaxed, then bulge your anal muscles.  Make the anal opening wide.  b) Stick your belly out as if you have swallowed a beach  ball. c) Make your belly wall hard using your belly muscles while continuing to breathe. Doing this makes it easier to open your anus. d) Breath out and give a grunt (or try using other sounds such as ahhhh, shhhhh, ohhhh or grrrrrrr).  4) Finish a) As you finish your bowel movement, pull the pelvic floor muscles up and in.  This will leave your anus in the proper place rather than remaining pushed out and down. If you leave your anus pushed out and down, it will start to feel as though that is normal and give you incorrect signals about needing to have a bowel movement.    Kayla Rosales, PT [facility] [address] [place] [address] [place], [place] [address]

## 2018-01-26 NOTE — Therapy (Addendum)
[facility] [address] [place], [place], [address] Phone: [phone]   Fax:  [phone]  Physical Therapy Evaluation  Patient Details  Name: Kayla Rosales MRN: [id number] Date of Birth: 1955-09-24 Referring Provider (PT): Dr. Silvano Rusk   Encounter Date: 01/26/2018  PT End of Session - 01/26/18 2323    Visit Number  1    Date for PT Re-Evaluation  03/23/18    Authorization Type  [employer]    PT Start Time  1530    PT Stop Time  1610    PT Time Calculation (min)  40 min    Activity Tolerance  Patient tolerated treatment well    Behavior During Therapy  [facility] for tasks assessed/performed       Past Medical History:  Diagnosis Date  . Allergy   . Arthritis   . Asthma   . Chicken pox   . Colon polyps   . Diverticulitis   . Early menopause occurring in patient age younger than 60 years   . GERD (gastroesophageal reflux disease)   . 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 IBS (irritable bowel syndrome)   . Lumbar herniated disc   . 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     Past Surgical History:  Procedure Laterality Date  . COLONOSCOPY  2015   [name]- [place], [place]; 5 yr repeat recommended  . KNEE ARTHROSCOPY Bilateral 2013   meniscus  x2  . NECK SURGERY  2018   disc fusion  . UPPER GASTROINTESTINAL ENDOSCOPY      There were no vitals filed for this visit.   Subjective Assessment - 01/26/18 1535    Subjective  Patient reports her issue started last year.  Patient reports she is taking Benefiber and feels a little better. Patient has IBS. Some days goes 6-7 times per day with small nuggets and can be 30 times.  I do not feel like I have fully evacuated my bowels. Patient reports she gets bloated. Patient has to strain to have a bowel movement.  Patient has to use two fingers  on front and back of anus to assist the bowel movements. Patient is gluten freee for 10 years.      Patient Stated Goals  have a regular bowel movment, reduction of feeling bloated    Currently in Pain?  Yes    Pain Score  8     Pain Location  Abdomen    Pain Orientation  Mid    Pain Descriptors / Indicators  Aching   bloating   Pain Type  Chronic pain    Pain Onset  More than a month ago    Pain Frequency  Intermittent    Aggravating Factors   diet; traveling; stress    Pain Relieving Factors  gluten free    Multiple Pain Sites  No         [facility] PT Assessment - 01/26/18 0001      Assessment   Medical Diagnosis  K58.1 irritable bowel syndrome with constipation; R19.8 Straining with stools    Referring Provider (PT)  Dr. Silvano Rusk    Onset Date/Surgical Date  04/15/17    Prior Therapy  none      Precautions   Precautions  None      Restrictions   Weight Bearing Restrictions  No      Balance Screen   Has the patient fallen in the past 6 months  No    Has the patient had a decrease in activity level because of a fear of falling?   No    Is the patient reluctant to leave their home because of a fear of falling?   No      Home Film/video editor residence      Prior Function   Level of Independence  Independent    Leisure  no exercise      Cognition   Overall Cognitive Status  Within Functional Limits for tasks assessed      Posture/Postural Control   Posture/Postural Control  No significant limitations      ROM / Strength   AROM / PROM / Strength  AROM;PROM;Strength      AROM   Overall AROM   --    Lumbar Extension  decreased by 50%    Lumbar - Right Side Bend  decreased by 50%    Lumbar - Left Side Bend  decreased by 50%      Strength   Overall Strength Comments  abdominal strength 2/5    Right Hip ABduction  4/5    Left Hip ABduction  4/5      Palpation   Palpation comment  tenderness located in right lumbar paraspinals                Objective measurements completed on examination: See above findings.     Pelvic Floor Special Questions - 01/26/18 0001    Currently Sexually Active  Yes    Is this Painful  No    Urinary Leakage  Yes    Pad use  none    Activities that cause leaking  With strong urge    Fecal incontinence  No    Falling out feeling (prolapse)  Yes    Skin Integrity  Intact   dry   Pelvic Floor Internal Exam  Patient confirms identiffication and approves PT to assess muscle strength and treatment    Exam Type  Rectal    Palpation  tightness in the internal and external anal sphincter; unable to bulge the rectum    Strength  weak squeeze, no lift               PT Education - 01/26/18 2322    Education Details  toileting technique; bulge of perineum    Person(s) Educated  Patient    Methods  Explanation;Demonstration;Handout    Comprehension  Returned demonstration;Verbalized understanding       PT Short Term Goals - 01/26/18 2332      PT SHORT TERM GOAL #1   Title  independent with initial HEP    Time  4    Period  Weeks    Status  New    Target Date  02/23/18      PT SHORT TERM GOAL #2   Title  understand correct toileting technique to assist with bowel movements    Time  4    Period  Weeks    Status  New    Target Date  02/23/18      PT SHORT TERM GOAL #3   Title  understand abodminal massage to assist in perstalic movement of the intestines    Time  4    Period  Weeks    Status  New    Target Date  02/23/18        PT Long Term Goals - 01/26/18 2333      PT  LONG TERM GOAL #1   Title  independent with HEP and understand how to progress herself    Time  8    Period  Weeks    Status  New    Target Date  03/23/18      PT LONG TERM GOAL #2   Title  walking program 30 minutes 3 times per week to promote good bowel health    Time  8    Period  Weeks    Status  New    Target Date  03/23/18      PT LONG TERM GOAL #3   Title  straining to have a bowel movement improved >/= 75% due to ability to bulge the pelvic floor    Time  8     Period  Weeks    Status  New    Target Date  03/23/18      PT LONG TERM GOAL #4   Title  not have to use her fingers around the anus to assist iin bowel evacuation due to improved coordination of the muscles    Time  8    Period  Weeks    Status  New    Target Date  03/23/18             Plan - 01/26/18 2323    Clinical Impression Statement  Patient is a 62 year old female with constipation and difficulty with evacuation of bowels for the last year. Patient reports she has been taking benefiber and it is helping. Patient has to press on the anterior and posterior aspect of the anus to assist in evacuation of bowels.  Patient reports she could have 5-30 bowel movements per day that are nuggets.  Patient feels she is not fully evacuating all the time.  Patient feels alot of bloating in the abdomen at a discomfort level 8/10.  Bilateral hip abduction strength is 4/5. Abdominal strength is 2/5. Rectal strength is 2/5 with difficulty bulging the anus. Patient has tightness in the internal and external sphincter. Patient has tenderness in the right lumbar paraspinals.  Lumbar ROM is limited by 50%. Patient will benefit from skilled therapy to improve anal sphincter coordination to have a full bowel movement and not have to assist with her fingers outside of the anus.     History and Personal Factors relevant to plan of care:  IBS; Stroke 2017; lumbar herniated disc    Clinical Presentation  Evolving    Clinical Presentation due to:  difficulty with fully evacuating her bowels therefore has to use her fingers outside the anus to evacuate bowels    Clinical Decision Making  Moderate    Rehab Potential  Excellent    Clinical Impairments Affecting Rehab Potential  IBS; Stroke 2017; lumbar herniated disc    PT Frequency  1x / week    PT Duration  8 weeks    PT Treatment/Interventions  Biofeedback;Electrical Stimulation;Therapeutic activities;Therapeutic exercise;Patient/family  education;Neuromuscular re-education;Manual techniques;Passive range of motion;Dry needling    PT Next Visit Plan  hip stretches; adominal bracing; abdominal massage; bulging of the pelvic floor, diet    Consulted and Agree with Plan of Care  Patient       Patient will benefit from skilled therapeutic intervention in order to improve the following deficits and impairments:  Increased fascial restricitons, Pain, Decreased coordination, Decreased mobility, Increased muscle spasms, Decreased activity tolerance, Decreased strength, Decreased range of motion  Visit Diagnosis: Muscle weakness (generalized)  Unspecified lack of coordination  Problem List Patient Active Problem List   Diagnosis Date Noted  . Osteopenia 01/19/2018  . Early menopause occurring in patient age younger than 13 years   . Irritable bowel syndrome with constipation 01/17/2018  . 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 History of colonic polyp 01/17/2018  . History of stroke 01/15/2018  . GERD (gastroesophageal reflux disease)   . Asthma     [name], PT 01/26/18 11:36 PM   [facility] [address], [place], [place], [address] Phone: [phone]   Fax:  [phone]  Name: Kayla Rosales MRN: [id number] Date of Birth: 1955-11-16 PHYSICAL THERAPY DISCHARGE SUMMARY  Visits from Start of Care: 1  Current functional level related to goals / functional outcomes: See above. Patient has not returned since her initial evaluation on 01/26/2018.    Remaining deficits: See above.    Education / Equipment: HEP Plan:                                                    Patient goals were not met. Patient is being discharged due to not returning since the last visit.  Thank you for the referral. [name], PT 03/23/18 8:23 AM  ?????

## 2018-02-02 ENCOUNTER — Telehealth: Payer: Self-pay | Admitting: *Deleted

## 2018-02-02 NOTE — Telephone Encounter (Signed)
Copied from [place] [id number]. Topic: Referral - Request for Referral >> Feb 02, 2018  9:05 AM Carolyn Stare wrote: Has patient seen PCP for this complaint?  yes   right wrist pain that has gotten worse   Referral for which specialty   Orthopedic   Preferred provider/office  N/A   Reason for referral  right wrist pain

## 2018-02-03 ENCOUNTER — Ambulatory Visit: Payer: Self-pay | Admitting: Gynecology

## 2018-02-05 ENCOUNTER — Ambulatory Visit: Payer: Managed Care, Other (non HMO) | Admitting: Physical Therapy

## 2018-03-09 ENCOUNTER — Encounter: Payer: Self-pay | Admitting: Obstetrics & Gynecology

## 2018-03-09 ENCOUNTER — Ambulatory Visit (INDEPENDENT_AMBULATORY_CARE_PROVIDER_SITE_OTHER): Payer: Managed Care, Other (non HMO) | Admitting: Obstetrics & Gynecology

## 2018-03-09 VITALS — BP 102/82 | Ht 65.0 in | Wt 180.0 lb

## 2018-03-09 DIAGNOSIS — E663 Overweight: Secondary | ICD-10-CM

## 2018-03-09 DIAGNOSIS — Z13820 Encounter for screening for osteoporosis: Secondary | ICD-10-CM | POA: Diagnosis not present

## 2018-03-09 DIAGNOSIS — Z01419 Encounter for gynecological examination (general) (routine) without abnormal findings: Secondary | ICD-10-CM

## 2018-03-09 DIAGNOSIS — Z780 Asymptomatic menopausal state: Secondary | ICD-10-CM | POA: Diagnosis not present

## 2018-03-09 NOTE — Addendum Note (Signed)
Addended by: Lorine Bears on: 03/09/2018 02:49 PM   Modules accepted: Orders

## 2018-03-09 NOTE — Patient Instructions (Addendum)
1. Encounter for routine gynecological examination with Papanicolaou smear of cervix Normal gynecologic exam and menopause.  Pap with high-risk HPV done today.  Breast exam normal.  Will schedule screening mammogram at the breast center.  Health labs done with family physician.  Colonoscopy 4 years ago.  2. Postmenopausal Well on no hormone replacement therapy.  No postmenopausal bleeding.  3. Screening for osteoporosis Recommend vitamin D supplements, calcium intake of 1.5 g/day and regular weightbearing physical activity.  Will organize bone density here now. - DG Bone Density; Future  4. Overweight (BMI 25.0-29.9) Recommend a lower calorie/carb diet such as [employer] or [employer].  Aerobic physical activities 5 times a week and weight lifting every 2 days.  Kayla Rosales, it was a pleasure meeting you today!  I will inform you of your results as soon as they are available.

## 2018-03-09 NOTE — Progress Notes (Signed)
Kayla Rosales 03-14-1956 [id number]   History:    62 y.o. G4P3A1L3 Remarried x 5 years.  Husband has 5 sons.  Many grand-children both sides.  RP:  New patient presenting for annual gyn exam   HPI: Early menopause in the 33s, on hormone replacement therapy since 2 years ago.  Had a stroke at that time and stopped hormone replacement therapy.  Good recovery from stroke.  No postmenopausal bleeding.  No pelvic pain.  No pain with intercourse.  Urine normal.  Difficulty passing stools, will see a gastroenterologist shortly.  Breasts normal.  Body mass index 29.95.  Will start back on more regular physical activity.  Health labs with family physician.  Past medical history,surgical history, family history and social history were all reviewed and documented in the EPIC chart.  Gynecologic History No LMP recorded. Patient is postmenopausal. Contraception: post menopausal status Last Pap: 2018. Results were: normal Last mammogram: 2018. Results were: normal Bone Density: 2016 Colonoscopy: 4 years ago  Obstetric History OB History  Gravida Para Term Preterm AB Living  4 3   1 1 3   SAB TAB Ectopic Multiple Live Births      0        # Outcome Date GA Lbr Len/2nd Weight Sex Delivery Anes PTL Lv  4 AB           3 Para           2 Para           1 Preterm              ROS: A ROS was performed and pertinent positives and negatives are included in the history.  GENERAL: No fevers or chills. HEENT: No change in vision, no earache, sore throat or sinus congestion. NECK: No pain or stiffness. CARDIOVASCULAR: No chest pain or pressure. No palpitations. PULMONARY: No shortness of breath, cough or wheeze. GASTROINTESTINAL: No abdominal pain, nausea, vomiting or diarrhea, melena or bright red blood per rectum. GENITOURINARY: No urinary frequency, urgency, hesitancy or dysuria. MUSCULOSKELETAL: No joint or muscle pain, no back pain, no recent trauma. DERMATOLOGIC: No rash, no itching, no lesions.  ENDOCRINE: No polyuria, polydipsia, no heat or cold intolerance. No recent change in weight. HEMATOLOGICAL: No anemia or easy bruising or bleeding. NEUROLOGIC: No headache, seizures, numbness, tingling or weakness. PSYCHIATRIC: No depression, no loss of interest in normal activity or change in sleep pattern.     Exam:   BP 102/82 (BP Location: Right Arm, Patient Position: Sitting, Cuff Size: Normal)   Ht 5\' 5"  (1.651 m)   Wt 180 lb (81.6 kg)   BMI 29.95 kg/m   Body mass index is 29.95 kg/m.  General appearance : Well developed well nourished female. No acute distress HEENT: Eyes: no retinal hemorrhage or exudates,  Neck supple, trachea midline, no carotid bruits, no thyroidmegaly Lungs: Clear to auscultation, no rhonchi or wheezes, or rib retractions  Heart: Regular rate and rhythm, no murmurs or gallops Breast:Examined in sitting and supine position were symmetrical in appearance, no palpable masses or tenderness,  no skin retraction, no nipple inversion, no nipple discharge, no skin discoloration, no axillary or supraclavicular lymphadenopathy Abdomen: no palpable masses or tenderness, no rebound or guarding Extremities: no edema or skin discoloration or tenderness  Pelvic: Vulva: Normal             Vagina: No gross lesions or discharge  Cervix: No gross lesions or discharge.  Pap/HPV HR done  Uterus  AV, normal size, shape and consistency, non-tender and mobile  Adnexa  Without masses or tenderness  Anus: Normal   Assessment/Plan:  62 y.o. female for annual exam   1. Encounter for routine gynecological examination with Papanicolaou smear of cervix Normal gynecologic exam and menopause.  Pap with high-risk HPV done today.  Breast exam normal.  Will schedule screening mammogram at the breast center.  Health labs done with family physician.  Colonoscopy 4 years ago.  2. Postmenopausal Well on no hormone replacement therapy.  No postmenopausal bleeding.  3. Screening for  osteoporosis Recommend vitamin D supplements, calcium intake of 1.5 g/day and regular weightbearing physical activity.  Will organize bone density here now. - DG Bone Density; Future  4. Overweight (BMI 25.0-29.9) Recommend a lower calorie/carb diet such as [employer] or [employer].  Aerobic physical activities 5 times a week and weight lifting every 2 days.  Princess Bruins MD, 2:20 PM 03/09/2018

## 2018-03-11 LAB — PAP, TP IMAGING W/ HPV RNA, RFLX HPV TYPE 16,18/45: HPV DNA High Risk: NOT DETECTED

## 2018-03-17 ENCOUNTER — Other Ambulatory Visit: Payer: Self-pay | Admitting: Obstetrics & Gynecology

## 2018-03-17 ENCOUNTER — Ambulatory Visit
Admission: RE | Admit: 2018-03-17 | Discharge: 2018-03-17 | Disposition: A | Discharge status: Home / Self Care | Payer: 59 | Source: Ambulatory Visit | Attending: Obstetrics & Gynecology | Admitting: Obstetrics & Gynecology

## 2018-03-17 DIAGNOSIS — Z1231 Encounter for screening mammogram for malignant neoplasm of breast: Secondary | ICD-10-CM

## 2018-04-01 ENCOUNTER — Ambulatory Visit (INDEPENDENT_AMBULATORY_CARE_PROVIDER_SITE_OTHER): Payer: Managed Care, Other (non HMO)

## 2018-04-01 DIAGNOSIS — Z13820 Encounter for screening for osteoporosis: Secondary | ICD-10-CM

## 2018-04-01 DIAGNOSIS — M8589 Other specified disorders of bone density and structure, multiple sites: Secondary | ICD-10-CM | POA: Diagnosis not present

## 2018-04-02 ENCOUNTER — Other Ambulatory Visit: Payer: Self-pay | Admitting: Obstetrics & Gynecology

## 2018-04-02 DIAGNOSIS — Z13820 Encounter for screening for osteoporosis: Secondary | ICD-10-CM

## 2018-04-02 DIAGNOSIS — M8589 Other specified disorders of bone density and structure, multiple sites: Secondary | ICD-10-CM

## 2018-04-16 ENCOUNTER — Encounter: Payer: Self-pay | Admitting: Nurse Practitioner

## 2018-04-24 ENCOUNTER — Encounter: Payer: Self-pay | Admitting: Nurse Practitioner

## 2018-04-24 ENCOUNTER — Encounter

## 2018-04-24 ENCOUNTER — Ambulatory Visit (INDEPENDENT_AMBULATORY_CARE_PROVIDER_SITE_OTHER): Payer: 59 | Admitting: Nurse Practitioner

## 2018-04-24 VITALS — BP 122/78 | HR 72 | Ht 65.0 in | Wt 183.0 lb

## 2018-04-24 DIAGNOSIS — R1032 Left lower quadrant pain: Secondary | ICD-10-CM

## 2018-04-24 DIAGNOSIS — K589 Irritable bowel syndrome without diarrhea: Secondary | ICD-10-CM

## 2018-04-24 DIAGNOSIS — Z86010 Personal history of colonic polyps: Secondary | ICD-10-CM | POA: Diagnosis not present

## 2018-04-24 NOTE — Patient Instructions (Addendum)
If you are age 64 or older, your body mass index should be between 23-30. Your Body mass index is 30.45 kg/m. If this is out of the aforementioned range listed, please consider follow up with your Primary Care Provider.  If you are age 64 or younger, your body mass index should be between 19-25. Your Body mass index is 30.45 kg/m. If this is out of the aformentioned range listed, please consider follow up with your Primary Care Provider.   You have been scheduled for a colonoscopy. Please follow written instructions given to you at your visit today.  Please pick up your prep supplies at the pharmacy within the next 1-3 days. If you use inhalers (even only as needed), please bring them with you on the day of your procedure. Your physician has requested that you go to www.startemmi.com and enter the access code given to you at your visit today. This web site gives a general overview about your procedure. However, you should still follow specific instructions given to you by our office regarding your preparation for the procedure.  Thank you for choosing me and [facility].   Tye Savoy, NP

## 2018-04-24 NOTE — Progress Notes (Addendum)
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 Having daily BM since we initiated fiber in October 2019 but stool consistency varies from ribbon like to explosive and she has developed intermittent LLQ discomfort (?spasms).  -polyp removed at time of last colonoscopy in 2015 at outside facility. Path not available. Told to have follow up colonoscopy at 5 years. She has some concerns about the bowel changes and abdominal pain. Will proceed with 5 year colonoscopy, especially given bowel changes and episode of rectal bleeding. The risks and benefits of colonoscopy with possible polypectomy were discussed and the patient agrees to proceed.   2. GERD, asymptomatic as long as she takes Nexium on daily basis.   3. Hx of CVA 2 years ago. Doesn't appear to have any residual deficits.  Agree with Ms. Guenther's assessment and plan. Gatha Mayer, MD, [facility]   HPI:     Patient is a 63 yo female known to Dr. Carlean Purl for history of IBS-C. She went through pelvic floor PT in 2017. At her last visit 01/13/18 we started Benefiber which has resulted in a daily BM but the consistency of her stool has changed. Stool consistency constantly varies. Somtimes stool is hard, sometimes stringy, sometimes ribbon like and sometimes explosive. Around Christmas she had blood in stool with a BM but admits to straining. Since her last visit here early October Saori has started having intermittent LLQ pain. Pain not always related to nor improved with defecation. It isn't related to eating or movement.   Review of systems:     No chest pain, no SOB, no fevers, no urinary sx   Past Medical History:  Diagnosis Date  . Allergy   . Arthritis   . Asthma   . Chicken pox   . Colon polyps   . Diverticulitis   . Early menopause occurring in patient age younger than 10 years   . GERD (gastroesophageal  reflux disease)   . 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 IBS (irritable bowel syndrome)   . Lumbar herniated disc   . Stroke (cerebrum) ([place]) 2017   right UE/neck sx, resolved.     Patient's surgical history, family medical history, social history, medications and allergies were all reviewed in Epic   Creatinine clearance cannot be calculated (Patient's most recent lab result is older than the maximum 21 days allowed.)  Current Outpatient Medications  Medication Sig Dispense Refill  . albuterol (PROVENTIL HFA;VENTOLIN HFA) 108 (90 Base) MCG/ACT inhaler Inhale 1-2 puffs into the lungs every 6 (six) hours as needed for wheezing or shortness of breath. 1 Inhaler 5  . aspirin EC 81 MG tablet Take 81 mg by mouth daily.    Marland Kitchen atorvastatin (LIPITOR) 40 MG tablet Take 1 tablet (40 mg total) by mouth at bedtime. 90 tablet 3  . azithromycin (ZITHROMAX) 250 MG tablet Take 250 mg by mouth daily.    . Cholecalciferol (D3-1000) 1000 units capsule Take 1,000 Units by mouth daily.    . cyanocobalamin 1000 MCG tablet Take 1,000 mcg by mouth daily.    Marland Kitchen esomeprazole (NEXIUM) 40 MG capsule Take 1 capsule (40 mg total) by mouth daily at 12 noon. 90 capsule 1  . fluticasone (FLONASE) 50 MCG/ACT nasal spray Place into both nostrils as needed for allergies or rhinitis.    Marland Kitchen loratadine-pseudoephedrine (CLARITIN-D 24-HOUR)  10-240 MG 24 hr tablet Take 1 tablet by mouth daily.    . magnesium 30 MG tablet Take 30 mg by mouth daily.    . Multiple Vitamins-Minerals (ALIVE WOMENS 50+ PO) Take by mouth daily.    . Omega-3 Fatty Acids (FISH OIL) 1000 MG CAPS Take by mouth daily.     No current facility-administered medications for this visit.     Physical Exam:     BP 122/78   Pulse 72   Ht 5\' 5"  (1.651 m)   Wt 183 lb (83 kg)   BMI 30.45 kg/m   GENERAL:  Pleasant female in NAD PSYCH: : Cooperative, normal affect EENT:  conjunctiva pink, mucous membranes moist, neck supple without masses CARDIAC:   RRR, no murmur heard, no peripheral edema PULM: Normal respiratory effort, lungs CTA bilaterally, no wheezing ABDOMEN:  Nondistended, soft, nontender. No obvious masses, no hepatomegaly,  normal bowel sounds SKIN:  turgor, no lesions seen Musculoskeletal:  Normal muscle tone, normal strength NEURO: Alert and oriented x 3, no focal neurologic deficits   Tye Savoy , NP 04/24/2018, 1:59 PM

## 2018-04-29 ENCOUNTER — Ambulatory Visit (INDEPENDENT_AMBULATORY_CARE_PROVIDER_SITE_OTHER): Payer: 59 | Admitting: Family Medicine

## 2018-04-29 ENCOUNTER — Encounter: Payer: Self-pay | Admitting: Family Medicine

## 2018-04-29 VITALS — BP 94/62 | HR 80 | Temp 98.6°F | Resp 16 | Ht 65.0 in | Wt 180.0 lb

## 2018-04-29 DIAGNOSIS — J4541 Moderate persistent asthma with (acute) exacerbation: Secondary | ICD-10-CM

## 2018-04-29 MED ORDER — DOXYCYCLINE HYCLATE 100 MG PO TABS: 100.0000 mg | ORAL_TABLET | Freq: Two times a day (BID) | ORAL | 0 refills | Status: DC

## 2018-04-29 MED ORDER — METHYLPREDNISOLONE ACETATE 80 MG/ML IJ SUSP
80.0000 mg | Freq: Once | INTRAMUSCULAR | Status: AC
  Administered 2018-04-29: 80 mg via INTRAMUSCULAR

## 2018-04-29 NOTE — Patient Instructions (Signed)
Rest, hydrate.  Continue  flonase, mucinex DM, nettie pot or nasal saline.  Doxycyline prescribed, take until completed.  IM depo medrol today Use albuterol 1-2 puffs every 6 hours as needed for wheezing  If cough present it can last up to 6-8 weeks.  F/U 2 weeks of not improved.     Acute Bronchitis, Adult Acute bronchitis is when air tubes (bronchi) in the lungs suddenly get swollen. The condition can make it hard to breathe. It can also cause these symptoms:  A cough.  Coughing up clear, yellow, or green mucus.  Wheezing.  Chest congestion.  Shortness of breath.  A fever.  Body aches.  Chills.  A sore throat. Follow these instructions at home:  Medicines  Take over-the-counter and prescription medicines only as told by your doctor.  If you were prescribed an antibiotic medicine, take it as told by your doctor. Do not stop taking the antibiotic even if you start to feel better. General instructions  Rest.  Drink enough fluids to keep your pee (urine) pale yellow.  Avoid smoking and secondhand smoke. If you smoke and you need help quitting, ask your doctor. Quitting will help your lungs heal faster.  Use an inhaler, cool mist vaporizer, or humidifier as told by your doctor.  Keep all follow-up visits as told by your doctor. This is important. How is this prevented? To lower your risk of getting this condition again:  Wash your hands often with soap and water. If you cannot use soap and water, use hand sanitizer.  Avoid contact with people who have cold symptoms.  Try not to touch your hands to your mouth, nose, or eyes.  Make sure to get the flu shot every year. Contact a doctor if:  Your symptoms do not get better in 2 weeks. Get help right away if:  You cough up blood.  You have chest pain.  You have very bad shortness of breath.  You become dehydrated.  You faint (pass out) or keep feeling like you are going to pass out.  You keep throwing  up (vomiting).  You have a very bad headache.  Your fever or chills gets worse. This information is not intended to replace advice given to you by your health care provider. Make sure you discuss any questions you have with your health care provider. Document Released: 09/18/2007 Document Revised: 11/13/2016 Document Reviewed: 09/20/2015 Elsevier Interactive Patient Education  2019 [employer].

## 2018-04-29 NOTE — Progress Notes (Signed)
Kayla Rosales , January 19, 1956, 63 y.o., female MRN: [id number] Patient Care Team    Relationship Specialty Notifications Start End  Kayla Hillock, DO PCP - General Family Medicine  01/15/18   Kayla Mayer, MD Consulting Physician Gastroenterology  01/19/18     Chief Complaint  Patient presents with  . Cough    x2 wks, non productive, went to urgent care approx 1.5 wks ago tx w zithromax and prednisone  . Wheezing    Chest hurts and burns  . Fatigue     Subjective: Pt presents for an OV with complaints of URi of 2 weeks duration.  Patient reports her symptoms started with chills, PND and did not "feel good." Her grandchild was ill. Associated symptoms include non-productive cough, wheezing and fatigue.  She was seen at UC 10 days ago and started on z-pack and prednisone secondary to shortness of breath. She thought she had an allergy attack. She is using flonase, mucinex, claritin.She has not used her albuterol inhaler.   Depression screen PHQ 2/9 01/15/2018  Decreased Interest 0  Down, Depressed, Hopeless 0  PHQ - 2 Score 0    Allergies  Allergen Reactions  . Hydromorphone Other (See Comments)    HYPOTENSION  . Meperidine Other (See Comments)    "BP drops"  . Sulfa Antibiotics Other (See Comments)    States she does not know the reaction    Social History   Tobacco Use  . Smoking status: Never Smoker  . Smokeless tobacco: Never Used  Substance Use Topics  . Alcohol use: Yes    Alcohol/week: 1.0 standard drinks    Types: 1 Glasses of wine per week    Comment: 1/2-1 per day   Past Medical History:  Diagnosis Date  . Allergy   . Arthritis   . Asthma   . Chicken pox   . Colon polyps   . Diverticulitis   . Early menopause occurring in patient age younger than 78 years   . GERD (gastroesophageal reflux disease)   . 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 IBS (irritable bowel syndrome)   . Lumbar herniated disc   . Stroke (cerebrum) ([place]) 2017   right  UE/neck sx, resolved.    Past Surgical History:  Procedure Laterality Date  . COLONOSCOPY  2015   [name]- [place], [place]; 5 yr repeat recommended  . KNEE ARTHROSCOPY Bilateral 2013   meniscus  x2  . NECK SURGERY  2018   disc fusion  . UPPER GASTROINTESTINAL ENDOSCOPY     Family History  Problem Relation Age of Onset  . Depression Mother   . Heart disease Mother   . Early death Mother 42  . Hypertension Mother   . Arthritis Father   . Heart disease Father   . Depression Sister 73  . Early death Sister   . Drug abuse Sister   . Lung cancer Sister   . Mental illness Sister   . COPD Sister   . Diabetes Maternal Grandmother   . Early death Maternal Grandmother   . Early death Maternal Grandfather   . Heart disease Maternal Grandfather   . Breast cancer Cousin    Allergies as of 04/29/2018      Reactions   Hydromorphone Other (See Comments)   HYPOTENSION   Meperidine Other (See Comments)   "BP drops"   Sulfa Antibiotics Other (See Comments)   States she does not know the reaction      Medication List  Accurate as of April 29, 2018  9:26 AM. Always use your most recent med list.        albuterol 108 (90 Base) MCG/ACT inhaler Commonly known as:  PROVENTIL HFA;VENTOLIN HFA Inhale 1-2 puffs into the lungs every 6 (six) hours as needed for wheezing or shortness of breath.   ALIVE WOMENS 50+ PO Take by mouth daily.   aspirin EC 81 MG tablet Take 81 mg by mouth daily.   atorvastatin 40 MG tablet Commonly known as:  LIPITOR Take 1 tablet (40 mg total) by mouth at bedtime.   cyanocobalamin 1000 MCG tablet Take 1,000 mcg by mouth daily.   D3-1000 25 MCG (1000 UT) capsule Generic drug:  Cholecalciferol Take 1,000 Units by mouth daily.   doxycycline 100 MG tablet Commonly known as:  VIBRA-TABS Take 1 tablet (100 mg total) by mouth 2 (two) times daily.   esomeprazole 40 MG capsule Commonly known as:  NEXIUM Take 1 capsule (40 mg total) by mouth daily  at 12 noon.   Fish Oil 1000 MG Caps Take by mouth daily.   fluticasone 50 MCG/ACT nasal spray Commonly known as:  FLONASE Place into both nostrils as needed for allergies or rhinitis.   loratadine-pseudoephedrine 10-240 MG 24 hr tablet Commonly known as:  CLARITIN-D 24-hour Take 1 tablet by mouth daily.   magnesium 30 MG tablet Take 30 mg by mouth daily.       All past medical history, surgical history, allergies, family history, immunizations andmedications were updated in the EMR today and reviewed under the history and medication portions of their EMR.     ROS: Negative, with the exception of above mentioned in HPI   Objective:  BP 94/62 (BP Location: Left Arm, Patient Position: Lying right side, Cuff Size: Normal)   Pulse 80   Temp 98.6 F (37 C) (Oral)   Resp 16   Ht 5\' 5"  (1.651 m)   Wt 180 lb (81.6 kg)   SpO2 97%   BMI 29.95 kg/m  Body mass index is 29.95 kg/m. Gen: Afebrile. No acute distress. Nontoxic in appearance, well developed, well nourished.  HENT: AT. [place]. Bilateral TM visualized without erythema or fullness. MMM, no oral lesions. Bilateral nares without erythema, swelling or drianage. Throat without erythema or exudates. Cough and hoarseness present.  Eyes:Pupils Equal Round Reactive to light, Extraocular movements intact,  Conjunctiva without redness, discharge or icterus. Neck/lymp/endocrine: Supple,no lymphadenopathy CV: RRR  Chest: CTAB, no wheeze or crackles. Good air movement, normal resp effort.  Neuro: Normal gait. PERLA. EOMi. Alert. Oriented x3 . Psych: Normal affect, dress and demeanor. Normal speech. Normal thought content and judgment.  No exam data present No results found. No results found for this or any previous visit (from the past 24 hour(s)).  Assessment/Plan: Kayla Rosales is a 63 y.o. female present for OV for  asthmatic bronchitis.  Rest, hydrate.  Continue  flonase, mucinex DM, nettie pot or nasal saline.  Doxycyline  prescribed, take until completed.  IM depo medrol injection provided today Use albuterol 1-2 puffs every 6 hours as needed for wheezing  If cough present it can last up to 6-8 weeks.  F/U 2 weeks of not improved.    Reviewed expectations re: course of current medical issues.  Discussed self-management of symptoms.  Outlined signs and symptoms indicating need for more acute intervention.  Patient verbalized understanding and all questions were answered.  Patient received an After-Visit Summary.    No orders of the defined types were  placed in this encounter.    Note is dictated utilizing voice recognition software. Although note has been proof read prior to signing, occasional typographical errors still can be missed. If any questions arise, please do not hesitate to call for verification.   electronically signed by:  Howard Pouch, DO  [place]

## 2018-05-15 ENCOUNTER — Ambulatory Visit (INDEPENDENT_AMBULATORY_CARE_PROVIDER_SITE_OTHER): Payer: Managed Care, Other (non HMO) | Admitting: Family Medicine

## 2018-05-15 ENCOUNTER — Encounter: Payer: Self-pay | Admitting: Family Medicine

## 2018-05-15 VITALS — BP 119/79 | HR 80 | Temp 97.8°F | Resp 16 | Ht 65.0 in | Wt 183.2 lb

## 2018-05-15 DIAGNOSIS — J4520 Mild intermittent asthma, uncomplicated: Secondary | ICD-10-CM | POA: Diagnosis not present

## 2018-05-15 DIAGNOSIS — T7840XD Allergy, unspecified, subsequent encounter: Secondary | ICD-10-CM

## 2018-05-15 MED ORDER — LEVOCETIRIZINE DIHYDROCHLORIDE 5 MG PO TABS: 5.0000 mg | ORAL_TABLET | Freq: Every evening | ORAL | 3 refills | Status: DC

## 2018-05-15 MED ORDER — FLUTICASONE PROPIONATE 50 MCG/ACT NA SUSP: 2.0000 | Freq: Every day | NASAL | 6 refills | Status: DC

## 2018-05-15 NOTE — Progress Notes (Signed)
Kayla Rosales , Nov 15, 1955, 63 y.o., female MRN: [id number] Patient Care Team    Relationship Specialty Notifications Start End  Ma Hillock, DO PCP - General Family Medicine  01/15/18   Gatha Mayer, MD Consulting Physician Gastroenterology  01/19/18     Chief Complaint  Patient presents with  . Sinusitis    Continues x1 month, pt visits dad weekly and feels it is worse after that and he has cats.   . Cough    Not productive, chest is tight at times, uses inhaler      Subjective:  She returns again today to discuss her sinus drainage and cough.  She was seen approximately 2 weeks ago and treated with a steroid shot and doxycycline for asthmatic bronchitis.  She is using the albuterol inhaler when needed.  She states the cough is now nonproductive but feels her chest feels tighter at times.  She feels her symptoms are more prevalent after visiting her dad, which she does weekly.  He has a few cats in his home.  She is never had allergies to cats in the past.  But she states she has not had cats or exposure to cats in a long time, and he has a rather long haired cat.  She does have known allergies to dust and used to take allergy shots.  He states after her visit she started to feel better a few days later after starting the antibiotics, but as soon as she visited her father her symptoms return.  He also has moved into a new home and is newer to this area.  She does take Claritin.  She does take Flonase.  Prior note:  Pt presents for an OV with complaints of URi of 2 weeks duration.  Patient reports her symptoms started with chills, PND and did not "feel good." Her grandchild was ill. Associated symptoms include non-productive cough, wheezing and fatigue.  She was seen at UC 10 days ago and started on z-pack and prednisone secondary to shortness of breath. She thought she had an allergy attack. She is using flonase, mucinex, claritin.She has not used her albuterol inhaler.   Depression  screen PHQ 2/9 01/15/2018  Decreased Interest 0  Down, Depressed, Hopeless 0  PHQ - 2 Score 0    Allergies  Allergen Reactions  . Hydromorphone Other (See Comments)    HYPOTENSION  . Meperidine Other (See Comments)    "BP drops"  . Sulfa Antibiotics Other (See Comments)    States she does not know the reaction    Social History   Tobacco Use  . Smoking status: Never Smoker  . Smokeless tobacco: Never Used  Substance Use Topics  . Alcohol use: Yes    Alcohol/week: 1.0 standard drinks    Types: 1 Glasses of wine per week    Comment: 1/2-1 per day   Past Medical History:  Diagnosis Date  . Allergy   . Arthritis   . Asthma   . Chicken pox   . Colon polyps   . Diverticulitis   . Early menopause occurring in patient age younger than 70 years   . GERD (gastroesophageal reflux disease)   . 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 IBS (irritable bowel syndrome)   . Lumbar herniated disc   . Stroke (cerebrum) ([place]) 2017   right UE/neck sx, resolved.    Past Surgical History:  Procedure Laterality Date  . COLONOSCOPY  2015   [name]- [place], [place]; 5  yr repeat recommended  . KNEE ARTHROSCOPY Bilateral 2013   meniscus  x2  . NECK SURGERY  2018   disc fusion  . UPPER GASTROINTESTINAL ENDOSCOPY     Family History  Problem Relation Age of Onset  . Depression Mother   . Heart disease Mother   . Early death Mother 59  . Hypertension Mother   . Arthritis Father   . Heart disease Father   . Depression Sister 50  . Early death Sister   . Drug abuse Sister   . Lung cancer Sister   . Mental illness Sister   . COPD Sister   . Diabetes Maternal Grandmother   . Early death Maternal Grandmother   . Early death Maternal Grandfather   . Heart disease Maternal Grandfather   . Breast cancer Cousin    Allergies as of 05/15/2018      Reactions   Hydromorphone Other (See Comments)   HYPOTENSION   Meperidine Other (See Comments)   "BP drops"   Sulfa Antibiotics  Other (See Comments)   States she does not know the reaction      Medication List       Accurate as of May 15, 2018  9:27 AM. Always use your most recent med list.        albuterol 108 (90 Base) MCG/ACT inhaler Commonly known as:  PROVENTIL HFA;VENTOLIN HFA Inhale 1-2 puffs into the lungs every 6 (six) hours as needed for wheezing or shortness of breath.   ALIVE WOMENS 50+ PO Take by mouth daily.   aspirin EC 81 MG tablet Take 81 mg by mouth daily.   atorvastatin 40 MG tablet Commonly known as:  LIPITOR Take 1 tablet (40 mg total) by mouth at bedtime.   cyanocobalamin 1000 MCG tablet Take 1,000 mcg by mouth daily.   D3-1000 25 MCG (1000 UT) capsule Generic drug:  Cholecalciferol Take 1,000 Units by mouth daily.   doxycycline 100 MG tablet Commonly known as:  VIBRA-TABS Take 1 tablet (100 mg total) by mouth 2 (two) times daily.   esomeprazole 40 MG capsule Commonly known as:  NEXIUM Take 1 capsule (40 mg total) by mouth daily at 12 noon.   Fish Oil 1000 MG Caps Take by mouth daily.   fluticasone 50 MCG/ACT nasal spray Commonly known as:  FLONASE Place into both nostrils as needed for allergies or rhinitis.   loratadine-pseudoephedrine 10-240 MG 24 hr tablet Commonly known as:  CLARITIN-D 24-hour Take 1 tablet by mouth daily.   magnesium 30 MG tablet Take 30 mg by mouth daily.       All past medical history, surgical history, allergies, family history, immunizations andmedications were updated in the EMR today and reviewed under the history and medication portions of their EMR.     ROS: Negative, with the exception of above mentioned in HPI   Objective:  BP 119/79 (BP Location: Left Arm, Patient Position: Sitting, Cuff Size: Normal)   Pulse 80   Temp 97.8 F (36.6 C) (Oral)   Resp 16   Ht 5\' 5"  (1.651 m)   Wt 183 lb 4 oz (83.1 kg)   SpO2 96%   BMI 30.49 kg/m  Body mass index is 30.49 kg/m. Gen: Afebrile. No acute distress.  Nontoxic in  presentation.  Pleasant Caucasian female. HENT: AT. [place]. Bilateral TM visualized and normal in appearance. MMM. Bilateral nares with drainage and mild swelling, no erythema. Throat without erythema or exudates.  Postnasal drip present.  Cough present.  Mild hoarseness present. Eyes:Pupils Equal Round Reactive to light, Extraocular movements intact,  Conjunctiva without redness, discharge or icterus. Neck/lymp/endocrine: Supple, no lymphadenopathy CV: RRR  Chest: CTAB, no wheeze or crackles.  Good air movement.  Normal respiratory effort Skin: No rashes, purpura or petechiae.  Neuro:  Normal gait. PERLA. EOMi. Alert. Oriented.   No exam data present No results found. No results found for this or any previous visit (from the past 24 hour(s)).  Assessment/Plan: Kamauri Kathol is a 63 y.o. female present for OV for  Asthmatic/allergies: Signs and symptoms today are less bronchitis features and more allergy features. -Stop Claritin, start Xyzal prescribed today. -Continue Flonase nasal spray, prescribed for her today. -Use albuterol inhaler for shortness of breath or wheezing. -Try humidifier in bedroom, and air purifier/allergy filters in the home. -Follow-up in 4 weeks if no improvement would consider adding Singulair versus referral to allergist if desired.  Reviewed expectations re: course of current medical issues.  Discussed self-management of symptoms.  Outlined signs and symptoms indicating need for more acute intervention.  Patient verbalized understanding and all questions were answered.  Patient received an After-Visit Summary.    No orders of the defined types were placed in this encounter.    Note is dictated utilizing voice recognition software. Although note has been proof read prior to signing, occasional typographical errors still can be missed. If any questions arise, please do not hesitate to call for verification.   electronically signed by:  Howard Pouch, DO    [place]

## 2018-05-15 NOTE — Patient Instructions (Signed)
Use flonase daily- did prescribe this to see if your insurance will cover Stop claritin and start xyzal before bed- called this in for you.  Refilled albuterol also for you.  Give this regimen a few weeks, if still not helping follow up.  Try humidifier in your bedroom.    Allergies, Adult An allergy means that your body reacts to something that bothers it (allergen). It is not a normal reaction. This can happen from something that you:  Eat.  Breathe in.  Touch. You can have an allergy (be allergic) to:  Outdoor things, like: ? Pollen. ? Grass. ? Weeds.  Indoor things, like: ? Dust. ? Smoke. ? Pet dander.  Foods.  Medicines.  Things that bother your skin, like: ? Detergents. ? Chemicals. ? Latex.  Perfume.  Bugs. An allergy cannot spread from person to person (is not contagious). Follow these instructions at home:         Stay away from things that you know you are allergic to.  If you have allergies to things in the air, wash out your nose each day. Do it with one of these: ? A salt-water (saline) spray. ? A container (neti pot).  Take over-the-counter and prescription medicines only as told by your doctor.  Keep all follow-up visits as told by your doctor. This is important.  If you are at risk for a very bad allergy reaction (anaphylaxis), keep an auto-injector with you all the time. This is called an epinephrine injection. ? This is pre-measured medicine with a needle. You can put it into your skin by yourself. ? Right after you have a very bad allergy reaction, you or a person with you must give the medicine in less than a few minutes. This is an emergency.  If you have ever had a very bad allergy reaction, wear a medical alert bracelet or necklace. Your very bad allergy should be written on it. Contact a health care provider if:  Your symptoms do not get better with treatment. Get help right away if:  You have symptoms of a very bad allergy  reaction. These include: ? A swollen mouth, tongue, or throat. ? Pain or tightness in your chest. ? Trouble breathing. ? Being short of breath. ? Dizziness. ? Fainting. ? Very bad pain in your belly (abdomen). ? Throwing up (vomiting). ? Watery poop (diarrhea). Summary  An allergy means that your body reacts to something that bothers it (allergen). It is not a normal reaction.  Stay away from things that make your body react.  Take over-the-counter and prescription medicines only as told by your doctor.  If you are at risk for a very bad allergy reaction, carry an auto-injector (epinephrine injection) all the time. Also, wear a medical alert bracelet or necklace so people know about your allergy. This information is not intended to replace advice given to you by your health care provider. Make sure you discuss any questions you have with your health care provider. Document Released: 07/27/2012 Document Revised: 07/15/2016 Document Reviewed: 07/15/2016 Elsevier Interactive Patient Education  2019 [employer].

## 2018-05-16 DIAGNOSIS — K648 Other hemorrhoids: Secondary | ICD-10-CM

## 2018-05-16 HISTORY — DX: Other hemorrhoids: K64.8

## 2018-05-21 ENCOUNTER — Telehealth: Payer: Self-pay | Admitting: Internal Medicine

## 2018-05-21 NOTE — Telephone Encounter (Signed)
PT states that she did not get the suprep for her procedure that is on 05-26-18 if we could send it to [facility]

## 2018-05-22 ENCOUNTER — Other Ambulatory Visit: Payer: Self-pay

## 2018-05-22 MED ORDER — PEG 3350-KCL-NABCB-NACL-NASULF 236 G PO SOLR: 4000.0000 mL | Freq: Once | ORAL | 0 refills | Status: AC

## 2018-05-22 NOTE — Telephone Encounter (Signed)
Called patient. She is not getting Suprep. Golytely sent to pharmacy according to colonoscopy instructions.

## 2018-05-25 ENCOUNTER — Telehealth: Payer: Self-pay | Admitting: Internal Medicine

## 2018-05-25 NOTE — Telephone Encounter (Signed)
Pt had questions about taking the dulcolax. She was asking if she needed to take all 4 tablets at one time.  I suggested to her that instead of taking all 4 at one time, she take 2 at the time instructed and then 2 after drinking the prep.  Pt. Verbalized understanding.

## 2018-05-25 NOTE — Telephone Encounter (Signed)
Patient cancelled colonoscopy for tomorrow 05/26/2018 at 2pm states she has to drive to [place] to take her father to the ED. States will cal back to reschedule.

## 2018-05-25 NOTE — Telephone Encounter (Signed)
Pt called back and wants to keep her colon for tomorrow.  I placed on her back on the schedule for 2pm tomorrow

## 2018-05-26 ENCOUNTER — Ambulatory Visit (AMBULATORY_SURGERY_CENTER): Payer: Managed Care, Other (non HMO) | Admitting: Internal Medicine

## 2018-05-26 ENCOUNTER — Encounter: Payer: Self-pay | Admitting: Internal Medicine

## 2018-05-26 ENCOUNTER — Encounter: Payer: 59 | Admitting: Internal Medicine

## 2018-05-26 VITALS — BP 124/68 | HR 60 | Temp 96.9°F | Resp 14 | Ht 65.0 in | Wt 186.0 lb

## 2018-05-26 DIAGNOSIS — Z860100 Personal history of colon polyps, unspecified: Secondary | ICD-10-CM

## 2018-05-26 DIAGNOSIS — D121 Benign neoplasm of appendix: Secondary | ICD-10-CM | POA: Diagnosis not present

## 2018-05-26 DIAGNOSIS — D123 Benign neoplasm of transverse colon: Secondary | ICD-10-CM

## 2018-05-26 DIAGNOSIS — R1032 Left lower quadrant pain: Secondary | ICD-10-CM

## 2018-05-26 DIAGNOSIS — K635 Polyp of colon: Secondary | ICD-10-CM

## 2018-05-26 DIAGNOSIS — K388 Other specified diseases of appendix: Secondary | ICD-10-CM

## 2018-05-26 DIAGNOSIS — D128 Benign neoplasm of rectum: Secondary | ICD-10-CM | POA: Diagnosis not present

## 2018-05-26 HISTORY — PX: COLONOSCOPY W/ POLYPECTOMY: SHX1380

## 2018-05-26 MED ORDER — SODIUM CHLORIDE 0.9 % IV SOLN: 500.0000 mL | Freq: Once | INTRAVENOUS | Status: DC

## 2018-05-26 NOTE — Progress Notes (Signed)
Called to room to assist during endoscopic procedure.  Patient ID and intended procedure confirmed with present staff. Received instructions for my participation in the procedure from the performing physician.  

## 2018-05-26 NOTE — Op Note (Signed)
[facility] Patient Name: Kayla Rosales Procedure Date: 05/26/2018 1:59 PM MRN: [id number] Endoscopist: Gatha Mayer , MD Age: 63 Referring MD:  Date of Birth: Feb 25, 1956 Gender: Female Account #: [id number] Procedure:                Colonoscopy Indications:              High risk colon cancer surveillance: Personal                            history of colonic polyps Medicines:                Propofol per Anesthesia, Monitored Anesthesia Care Procedure:                Pre-Anesthesia Assessment:                           - Prior to the procedure, a History and Physical                            was performed, and patient medications and                            allergies were reviewed. The patient's tolerance of                            previous anesthesia was also reviewed. The risks                            and benefits of the procedure and the sedation                            options and risks were discussed with the patient.                            All questions were answered, and informed consent                            was obtained. Prior Anticoagulants: The patient has                            taken no previous anticoagulant or antiplatelet                            agents. ASA Grade Assessment: II - A patient with                            mild systemic disease. After reviewing the risks                            and benefits, the patient was deemed in                            satisfactory condition to undergo the procedure.  After obtaining informed consent, the colonoscope                            was passed under direct vision. Throughout the                            procedure, the patient's blood pressure, pulse, and                            oxygen saturations were monitored continuously. The                            Colonoscope was introduced through the anus and                            advanced to the  the cecum, identified by                            appendiceal orifice and ileocecal valve. The                            colonoscopy was performed with moderate difficulty                            due to significant looping. Successful completion                            of the procedure was aided by changing the patient                            to a supine position and using manual pressure. The                            patient tolerated the procedure well. The quality                            of the bowel preparation was excellent. The                            ileocecal valve, appendiceal orifice, and rectum                            were photographed. Scope In: 2:12:48 PM Scope Out: 2:35:36 PM Scope Withdrawal Time: 0 hours 11 minutes 57 seconds  Total Procedure Duration: 0 hours 22 minutes 48 seconds  Findings:                 The perianal and digital rectal examinations were                            normal.                           Five sessile polyps were found in the rectum,  transverse colon and appendiceal orifice. The                            polyps were 1 to 6 mm in size. These polyps were                            removed with a cold snare. Resection and retrieval                            were complete. Verification of patient                            identification for the specimen was done. Estimated                            blood loss was minimal.                           Multiple diverticula were found in the sigmoid                            colon. There was narrowing of the colon in                            association with the diverticular opening.                           Scattered diverticula were found in the right colon.                           Internal hemorrhoids were found.                           The exam was otherwise without abnormality on                            direct and retroflexion  views. Complications:            No immediate complications. Estimated Blood Loss:     Estimated blood loss was minimal. Impression:               - Five 1 to 6 mm polyps in the rectum, in the                            transverse colon and at the appendiceal orifice,                            removed with a cold snare. Resected and retrieved.                           - Severe diverticulosis in the sigmoid colon. There                            was narrowing of the colon in association with the  diverticular opening.                           - Mild diverticulosis in the right colon.                           - Internal hemorrhoids.                           - The examination was otherwise normal on direct                            and retroflexion views. Recommendation:           - Patient has a contact number available for                            emergencies. The signs and symptoms of potential                            delayed complications were discussed with the                            patient. Return to normal activities tomorrow.                            Written discharge instructions were provided to the                            patient.                           - High fiber diet.                           - Continue present medications.                           - Repeat colonoscopy for surveillance based on                            pathology results.                           - Increase Benefiber to 2 tbsp/day Gatha Mayer, MD 05/26/2018 2:46:25 PM This report has been signed electronically.

## 2018-05-26 NOTE — Progress Notes (Signed)
Report given to PACU, vss 

## 2018-05-26 NOTE — Patient Instructions (Addendum)
You have diverticulosis which I think is why you have constipation and bowel habit problems. I recommend increasing the Benefiber to 2 tablespoons a day. Can also increase fiber in diet.  I found and removed 5 small polyps.  There ar also internal hemorrhoids.  I will let you know pathology results and when to have another routine colonoscopy by mail and/or My Chart.  I appreciate the opportunity to care for you. Gatha Mayer, MD, FACG    YOU HAD AN ENDOSCOPIC PROCEDURE TODAY AT [facility]:   Refer to the procedure report that was given to you for any specific questions about what was found during the examination.  If the procedure report does not answer your questions, please call your gastroenterologist to clarify.  If you requested that your care partner not be given the details of your procedure findings, then the procedure report has been included in a sealed envelope for you to review at your convenience later.  YOU SHOULD EXPECT: Some feelings of bloating in the abdomen. Passage of more gas than usual.  Walking can help get rid of the air that was put into your GI tract during the procedure and reduce the bloating. If you had a lower endoscopy (such as a colonoscopy or flexible sigmoidoscopy) you may notice spotting of blood in your stool or on the toilet paper. If you underwent a bowel prep for your procedure, you may not have a normal bowel movement for a few days.  Please Note:  You might notice some irritation and congestion in your nose or some drainage.  This is from the oxygen used during your procedure.  There is no need for concern and it should clear up in a day or so.  SYMPTOMS TO REPORT IMMEDIATELY:   Following lower endoscopy (colonoscopy or flexible sigmoidoscopy):  Excessive amounts of blood in the stool  Significant tenderness or worsening of abdominal pains  Swelling of the abdomen that is new, acute  Fever of 100F or higher   For  urgent or emergent issues, a gastroenterologist can be reached at any hour by calling [phone].   DIET:  We do recommend a small meal at first, but then you may proceed to your regular diet.  Drink plenty of fluids but you should avoid alcoholic beverages for 24 hours.  ACTIVITY:  You should plan to take it easy for the rest of today and you should NOT DRIVE or use heavy machinery until tomorrow (because of the sedation medicines used during the test).    FOLLOW UP: Our staff will call the number listed on your records the next business day following your procedure to check on you and address any questions or concerns that you may have regarding the information given to you following your procedure. If we do not reach you, we will leave a message.  However, if you are feeling well and you are not experiencing any problems, there is no need to return our call.  We will assume that you have returned to your regular daily activities without incident.  If any biopsies were taken you will be contacted by phone or by letter within the next 1-3 weeks.  Please call us at [phone] if you have not heard about the biopsies in 3 weeks.    SIGNATURES/CONFIDENTIALITY: You and/or your care partner have signed paperwork which will be entered into your electronic medical record.  These signatures attest to the fact that that the information above  on your After Visit Summary has been reviewed and is understood.  Full responsibility of the confidentiality of this discharge information lies with you and/or your care-partner.    Handouts were given to your care partner on polyps, hemorrhoids, diverticulosis, and a high fiber diet with liberal fluid intake. You may resume your current medications today. Increase BENEFIBER to 2 TBSP daily Await biopsy results. Please call if any questions or concerns.

## 2018-05-26 NOTE — Progress Notes (Signed)
Pt said her abdomen was sore on discharge.  Abdomen was soft and she was passing a good amount of l\flatus.  Elizabeth Palau, CRNA said abdominal pressure was given to aid scope advancement.  I advised pt of this and maybe try tylrnol and gas-X.  maw

## 2018-05-27 ENCOUNTER — Encounter: Payer: Self-pay | Admitting: Family Medicine

## 2018-05-27 ENCOUNTER — Telehealth: Payer: Self-pay

## 2018-05-27 DIAGNOSIS — K5790 Diverticulosis of intestine, part unspecified, without perforation or abscess without bleeding: Secondary | ICD-10-CM | POA: Insufficient documentation

## 2018-05-27 NOTE — Telephone Encounter (Signed)
  Follow up Call-  Call back number 05/26/2018  Post procedure Call Back phone  # [phone]  Permission to leave phone message Yes  Some recent data might be hidden     Patient questions:  Do you have a fever, pain , or abdominal swelling? No. Pain Score  0 *  Have you tolerated food without any problems? Yes.    Have you been able to return to your normal activities? Yes.    Do you have any questions about your discharge instructions: Diet   No. Medications  No. Follow up visit  No.  Do you have questions or concerns about your Care? No.  Actions: * If pain score is 4 or above: No action needed, pain <4.  No problems noted per pt. maw

## 2018-06-01 ENCOUNTER — Encounter: Payer: Self-pay | Admitting: Internal Medicine

## 2018-06-01 NOTE — Progress Notes (Signed)
5 diminutive ssp's Recall 2023

## 2018-10-30 ENCOUNTER — Telehealth: Payer: Self-pay | Admitting: Family Medicine

## 2018-10-30 NOTE — Telephone Encounter (Signed)
Please advise 

## 2018-10-30 NOTE — Telephone Encounter (Signed)
Hydrate and advil. Not much in the way I can tell her to do on short notice without seeing her first to discuss possible causes of cramps.

## 2018-10-30 NOTE — Telephone Encounter (Signed)
Patient is experiencing leg cramps and made an appointment for the first available appt on Monday. Patient is requesting a call back to get advice on what to do this weekend.

## 2018-11-02 ENCOUNTER — Encounter: Payer: Self-pay | Admitting: Family Medicine

## 2018-11-02 ENCOUNTER — Other Ambulatory Visit: Payer: Self-pay

## 2018-11-02 ENCOUNTER — Ambulatory Visit (INDEPENDENT_AMBULATORY_CARE_PROVIDER_SITE_OTHER): Payer: Managed Care, Other (non HMO) | Admitting: Family Medicine

## 2018-11-02 VITALS — BP 121/83 | HR 72 | Temp 98.4°F | Resp 16 | Ht 60.0 in | Wt 180.0 lb

## 2018-11-02 DIAGNOSIS — R252 Cramp and spasm: Secondary | ICD-10-CM | POA: Diagnosis not present

## 2018-11-02 LAB — VITAMIN D 25 HYDROXY (VIT D DEFICIENCY, FRACTURES): VITD: 40.47 ng/mL (ref 30.00–100.00)

## 2018-11-02 LAB — BASIC METABOLIC PANEL
BUN: 12 mg/dL (ref 6–23)
CO2: 26 mEq/L (ref 19–32)
Calcium: 9.2 mg/dL (ref 8.4–10.5)
Chloride: 102 mEq/L (ref 96–112)
Creatinine, Ser: 0.68 mg/dL (ref 0.40–1.20)
GFR: 87.16 mL/min (ref >60.00)
Glucose, Bld: 75 mg/dL (ref 70–99)
Potassium: 4 mEq/L (ref 3.5–5.1)
Sodium: 138 mEq/L (ref 135–145)

## 2018-11-02 LAB — T4, FREE: Free T4: 0.7 ng/dL (ref 0.60–1.60)

## 2018-11-02 LAB — TSH: TSH: 1.47 u[IU]/mL (ref 0.35–4.50)

## 2018-11-02 LAB — MAGNESIUM: Magnesium: 1.9 mg/dL (ref 1.5–2.5)

## 2018-11-02 NOTE — Progress Notes (Signed)
Kayla Rosales , [birth year], 63 y.o., female MRN: [id number] Patient Care Team    Relationship Specialty Notifications Start End  Ma Hillock, DO PCP - General Family Medicine  01/15/18   Gatha Mayer, MD Consulting Physician Gastroenterology  01/19/18     Chief Complaint  Patient presents with  . Leg Pain    Random spots cramping in legs. Has gotten really bad over the last couple of weeks.   Most recently feels like it is starting to happen in hands and rest of her body hurts.     Subjective: Pt presents for an OV with complaints of leg cramps of 1 month  duration.  Associated symptoms include worsening over the last week.  She reports her cramps can be in random locations including her right thigh, her calves, near her left knee.  She did start taking magnesium 2 to 3 days ago and they have eased up a small fraction.  She reports she is eating a banana every day.  She naturally avoids high sodium meals.  She drinks water frequently but uncertain if she drinks as much as she should.  She reports the cramps occur mostly at night.  She has never been a smoker.  Depression screen PHQ 2/9 01/15/2018  Decreased Interest 0  Down, Depressed, Hopeless 0  PHQ - 2 Score 0    Allergies  Allergen Reactions  . Hydromorphone Other (See Comments)    HYPOTENSION  . Meperidine Other (See Comments)    "BP drops"  . Sulfa Antibiotics Other (See Comments)    States she does not know the reaction    Social History   Social History Narrative   Married second family - 8 kids 62 grandkids blended   Haematologist, retired Pharmacist, hospital   Caffeine 2/day   Smoker - never   EtOH - rare   No drugs   Wears her seatbelt, smoke alarm at home.   Feels safe in her relationships.   Past Medical History:  Diagnosis Date  . Allergy   . Arthritis   . Asthma   . Chicken pox   . Colon polyps   . Colon polyps   . Diverticulitis   . Early menopause occurring in patient age younger than 89 years   .  GERD (gastroesophageal reflux disease)   . 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 IBS (irritable bowel syndrome)   . Internal hemorrhoids 05/2018  . Lumbar herniated disc   . 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    Past Surgical History:  Procedure Laterality Date  . COLONOSCOPY  2015   [name]- [place], [place]; 5 yr repeat recommended  . COLONOSCOPY W/ POLYPECTOMY  05/26/2018   x5 polyps  . KNEE ARTHROSCOPY Bilateral 2013   meniscus  x2  . NECK SURGERY  2018   disc fusion  . UPPER GASTROINTESTINAL ENDOSCOPY     Family History  Problem Relation Age of Onset  . Depression Mother   . Heart disease Mother   . Early death Mother 58  . Hypertension Mother   . Arthritis Father   . Heart disease Father   . Depression Sister 63  . Early death Sister   . Drug abuse Sister   . Lung cancer Sister   . Mental illness Sister   . COPD Sister   . Diabetes Maternal Grandmother   . Early death Maternal Grandmother   . Early death Maternal Grandfather   .  Heart disease Maternal Grandfather   . Breast cancer Cousin    Allergies as of 11/02/2018      Reactions   Hydromorphone Other (See Comments)   HYPOTENSION   Meperidine Other (See Comments)   "BP drops"   Sulfa Antibiotics Other (See Comments)   States she does not know the reaction      Medication List       Accurate as of November 02, 2018  1:40 PM. If you have any questions, ask your nurse or doctor.        albuterol 108 (90 Base) MCG/ACT inhaler Commonly known as: VENTOLIN HFA Inhale 1-2 puffs into the lungs every 6 (six) hours as needed for wheezing or shortness of breath.   ALIVE WOMENS 50+ PO Take by mouth daily.   aspirin EC 81 MG tablet Take 81 mg by mouth daily.   atorvastatin 40 MG tablet Commonly known as: LIPITOR Take 1 tablet (40 mg total) by mouth at bedtime.   cyanocobalamin 1000 MCG tablet Take 1,000 mcg by mouth daily.   D3-1000 25 MCG (1000 UT) capsule Generic  drug: Cholecalciferol Take 1,000 Units by mouth daily.   esomeprazole 40 MG capsule Commonly known as: NEXIUM Take 1 capsule (40 mg total) by mouth daily at 12 noon.   Fish Oil 1000 MG Caps Take by mouth daily.   fluticasone 50 MCG/ACT nasal spray Commonly known as: FLONASE Place 2 sprays into both nostrils daily.   levocetirizine 5 MG tablet Commonly known as: Xyzal Take 1 tablet (5 mg total) by mouth every evening.   magnesium 30 MG tablet Take 30 mg by mouth daily.       All past medical history, surgical history, allergies, family history, immunizations andmedications were updated in the EMR today and reviewed under the history and medication portions of their EMR.     ROS: Negative, with the exception of above mentioned in HPI   Objective:  BP 121/83   Pulse 72   Temp 98.4 F (36.9 C) (Temporal)   Resp 16   Ht 5' (1.524 m)   Wt 180 lb (81.6 kg)   SpO2 95%   BMI 35.15 kg/m  Body mass index is 35.15 kg/m. Gen: Afebrile. No acute distress. Nontoxic in appearance, well developed, well nourished.  HENT: AT. [place]. MMM Eyes:Pupils Equal Round Reactive to light, Extraocular movements intact,  Conjunctiva without redness, discharge or icterus. MSK: No erythema, no soft tissue swelling, no rashes.  Muscle strength 5/5 bilateral lower extremity.  Neurovascularly intact distally.  Negative Homans bilaterally. Neuro:  Normal gait. PERLA. EOMi. Alert. Oriented x3  No exam data present No results found. No results found for this or any previous visit (from the past 24 hour(s)).  Assessment/Plan: Kayla Rosales is a 63 y.o. female present for OV for  Leg cramping She is having some left knee problems and seeing orthopedic.  Leg cramps could partially be due secondary to compensation of weight/gait. -Encouraged her to hydrate at least 80 ounces of water daily.  Add 1 ZICO coconut water daily. -Continue magnesium 250 mg daily.  Discussed GI side effects of too much magnesium  supplement. -We will check her electrolytes/BMP/thyroid and magnesium today.  Along with vitamin D. - Basic Metabolic Panel (BMET) - Magnesium - TSH - Vitamin D (25 hydroxy) - T4, free -AVS on leg cramps provided to patient today.  Exam is normal with good blood flow to her lower extremities.   Reviewed expectations re: course of current medical  issues.  Discussed self-management of symptoms.  Outlined signs and symptoms indicating need for more acute intervention.  Patient verbalized understanding and all questions were answered.  Patient received an After-Visit Summary.    No orders of the defined types were placed in this encounter.  > 25 minutes spent with patient, >50% of time spent face to face     Note is dictated utilizing voice recognition software. Although note has been proof read prior to signing, occasional typographical errors still can be missed. If any questions arise, please do not hesitate to call for verification.   electronically signed by:  Howard Pouch, DO  [place]

## 2018-11-02 NOTE — Progress Notes (Signed)
Pre visit review using our clinic review tool, if applicable. No additional management support is needed unless otherwise documented below in the visit note. 

## 2018-11-02 NOTE — Telephone Encounter (Signed)
Note wasn't sent until after clinical staff was gone Friday.  Patient has appt today at 1:30pm.

## 2018-11-02 NOTE — Patient Instructions (Addendum)
Continue to hydrate.  Try coconut water (ZICO is a good brand).  Continue magnesium.    We will check your electrolytes.     Leg Cramps Leg cramps occur when one or more muscles tighten and you have no control over this tightening (involuntary muscle contraction). Muscle cramps can develop in any muscle, but the most common place is in the calf muscles of the leg. Those cramps can occur during exercise or when you are at rest. Leg cramps are painful, and they may last for a few seconds to a few minutes. Cramps may return several times before they finally stop. Usually, leg cramps are not caused by a serious medical problem. In many cases, the cause is not known. Some common causes include:  Excessive physical effort (overexertion), such as during intense exercise.  Overuse from repetitive motions, or doing the same thing over and over.  Staying in a certain position for a long period of time.  Improper preparation, form, or technique while performing a sport or an activity.  Dehydration.  Injury.  Side effects of certain medicines.  Abnormally low levels of minerals in your blood (electrolytes), especially potassium and calcium. This could result from: ? Pregnancy. ? Taking diuretic medicines. Follow these instructions at home: Eating and drinking  Drink enough fluid to keep your urine pale yellow. Staying hydrated may help prevent cramps.  Eat a healthy diet that includes plenty of nutrients to help your muscles function. A healthy diet includes fruits and vegetables, lean protein, whole grains, and low-fat or nonfat dairy products. Managing pain, stiffness, and swelling      Try massaging, stretching, and relaxing the affected muscle. Do this for several minutes at a time.  If directed, put ice on areas that are sore or painful after a cramp: ? Put ice in a plastic bag. ? Place a towel between your skin and the bag. ? Leave the ice on for 20 minutes, 2-3 times a  day.  If directed, apply heat to muscles that are tense or tight. Do this before you exercise, or as often as told by your health care provider. Use the heat source that your health care provider recommends, such as a moist heat pack or a heating pad. ? Place a towel between your skin and the heat source. ? Leave the heat on for 20-30 minutes. ? Remove the heat if your skin turns bright red. This is especially important if you are unable to feel pain, heat, or cold. You may have a greater risk of getting burned.  Try taking hot showers or baths to help relax tight muscles. General instructions  If you are having frequent leg cramps, avoid intense exercise for several days.  Take over-the-counter and prescription medicines only as told by your health care provider.  Keep all follow-up visits as told by your health care provider. This is important. Contact a health care provider if:  Your leg cramps get more severe or more frequent, or they do not improve over time.  Your foot becomes cold, numb, or blue. Summary  Muscle cramps can develop in any muscle, but the most common place is in the calf muscles of the leg.  Leg cramps are painful, and they may last for a few seconds to a few minutes.  Usually, leg cramps are not caused by a serious medical problem. Often, the cause is not known.  Stay hydrated and take over-the-counter and prescription medicines only as told by your health care provider. This  information is not intended to replace advice given to you by your health care provider. Make sure you discuss any questions you have with your health care provider. Document Released: 05/09/2004 Document Revised: 03/14/2017 Document Reviewed: 01/09/2017 Elsevier Patient Education  2020 [employer].

## 2018-11-06 ENCOUNTER — Telehealth: Payer: Self-pay | Admitting: Internal Medicine

## 2018-11-06 NOTE — Telephone Encounter (Signed)
Patient reports that she is not getting relief with fiber.  She will try adding Mirialx 1-3 times a day. She will come for follow up on 12/01/18

## 2018-11-27 ENCOUNTER — Telehealth: Payer: Self-pay | Admitting: Family Medicine

## 2018-11-27 NOTE — Telephone Encounter (Signed)
Patient feels hungry all of time and having urinary frequency. Her leg cramps discussed in last office  visit have gotten better since she started taking magnesium and drinking coconut water. Patient is concerned about "her sugars". Patient is requesting "sugar test". Patient has a family history of diabetes.

## 2018-11-27 NOTE — Telephone Encounter (Signed)
Pt was called and message was left letting patient know that she would have to schedule an appt as these are new problems she is having and to call back to schedule.

## 2018-12-01 ENCOUNTER — Ambulatory Visit (INDEPENDENT_AMBULATORY_CARE_PROVIDER_SITE_OTHER): Payer: Managed Care, Other (non HMO) | Admitting: Internal Medicine

## 2018-12-01 ENCOUNTER — Other Ambulatory Visit: Payer: Self-pay

## 2018-12-01 ENCOUNTER — Encounter: Payer: Self-pay | Admitting: Internal Medicine

## 2018-12-01 VITALS — BP 122/80 | HR 88 | Temp 97.9°F | Ht 65.0 in | Wt 182.1 lb

## 2018-12-01 DIAGNOSIS — K581 Irritable bowel syndrome with constipation: Secondary | ICD-10-CM

## 2018-12-01 DIAGNOSIS — E739 Lactose intolerance, unspecified: Secondary | ICD-10-CM | POA: Diagnosis not present

## 2018-12-01 DIAGNOSIS — K5902 Outlet dysfunction constipation: Secondary | ICD-10-CM | POA: Diagnosis not present

## 2018-12-01 DIAGNOSIS — K5730 Diverticulosis of large intestine without perforation or abscess without bleeding: Secondary | ICD-10-CM

## 2018-12-01 DIAGNOSIS — M6289 Other specified disorders of muscle: Secondary | ICD-10-CM | POA: Diagnosis not present

## 2018-12-01 NOTE — Patient Instructions (Addendum)
We have placed a referral in the system for you to have more pelvic floor P.T. with [name].   We are giving you a handout to read on Lactose Intolerance.    I appreciate the opportunity to care for you. Silvano Rusk, MD, [facility]

## 2018-12-01 NOTE — Progress Notes (Signed)
Kayla Rosales 63 y.o. October 23, 1955 [id number]  Assessment & Plan:   Constipation due to outlet dysfunction She has agreed to try pelvic floor physical therapy again.  I think that can help her and significantly improve her quality of life.  Diverticulosis of colon - symptomatic This is probably contributing some to her constipation and I think she also sometimes has some left lower quadrant discomfort potentially related to this.  Lactose intolerance Handout provided to modify diet consider supplements  Pelvic floor dysfunction in female She will return to physical therapy  Irritable bowel syndrome with constipation Overall diagnosis consistent with this but multiple other contributors as outlined  I appreciate the opportunity to care for this patient. CC: Kuneff, [name], DO [name], PT   Subjective:   Chief Complaint: Constipation  HPI Kayla Rosales is here for follow-up, in the past she has had difficulty with defecation and I referred her to pelvic floor physical therapy which she tried but it was awkward.  So she really did not have a full course of treatment.  She has a history of non-celiac gluten sensitivity and IBS with constipation.  She continues to struggle with straining to stool.  MiraLAX is helping when used but she still not satisfied with her quality of life.  She also has noted gas and bloating with milk products but not cheese.  She tried 1 of her husband's Lactaid supplements and that seemed to help.  Some left lower quadrant discomfort at times as well.  She has situational stressors, her 1 year old father has some dementia and lives in [place] and she has to travel back and forth to help though she has been getting some help that he accepts (that has been difficult, for him to accept people helping).  She is helping her 29-year-old granddaughter to kindergarten on line.  We also discussed how she has fairly significant diverticulosis some some  narrowing of the colonic lumen.  And that may be impacting things.  February 2020 she had 5 diminutive sessile serrated polyps in the diverticulosis and some hemorrhoids.  Anticipate repeating a colonoscopy in 2023. Allergies  Allergen Reactions  . Hydromorphone Other (See Comments)    HYPOTENSION  . Meperidine Other (See Comments)    "BP drops"  . Sulfa Antibiotics Other (See Comments)    States she does not know the reaction    Current Meds  Medication Sig  . albuterol (PROVENTIL HFA;VENTOLIN HFA) 108 (90 Base) MCG/ACT inhaler Inhale 1-2 puffs into the lungs every 6 (six) hours as needed for wheezing or shortness of breath.  Marland Kitchen aspirin EC 81 MG tablet Take 81 mg by mouth daily.  Marland Kitchen atorvastatin (LIPITOR) 40 MG tablet Take 1 tablet (40 mg total) by mouth at bedtime.  . Cholecalciferol (D3-1000) 1000 units capsule Take 1,000 Units by mouth daily.  . cyanocobalamin 1000 MCG tablet Take 1,000 mcg by mouth daily.  Marland Kitchen esomeprazole (NEXIUM) 40 MG capsule Take 1 capsule (40 mg total) by mouth daily at 12 noon.  . fluticasone (FLONASE) 50 MCG/ACT nasal spray Place 2 sprays into both nostrils daily.  . magnesium 30 MG tablet Take 30 mg by mouth daily.  . Multiple Vitamins-Minerals (ALIVE WOMENS 50+ PO) Take by mouth daily.  . Omega-3 Fatty Acids (FISH OIL) 1000 MG CAPS Take by mouth daily.   Past Medical History:  Diagnosis Date  . Allergy   . Arthritis   . Asthma   . Chicken pox   . Colon polyps   .  Colon polyps   . Diverticulitis   . Early menopause occurring in patient age younger than 5 years   . GERD (gastroesophageal reflux disease)   . 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 IBS (irritable bowel syndrome)   . Internal hemorrhoids 05/2018  . Lumbar herniated disc   . 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    Past Surgical History:  Procedure Laterality Date  . COLONOSCOPY  2015   [name]- [place], [place]; 5 yr repeat recommended  . COLONOSCOPY W/  POLYPECTOMY  05/26/2018   x5 polyps  . KNEE ARTHROSCOPY Bilateral 2013   meniscus  x2  . NECK SURGERY  2018   disc fusion  . UPPER GASTROINTESTINAL ENDOSCOPY     Social History   Social History Narrative   Married second family - 54 kids 106 grandkids blended   Haematologist, retired Pharmacist, hospital   Caffeine 2/day   Smoker - never   EtOH - rare   No drugs   Wears her seatbelt, smoke alarm at home.   Feels safe in her relationships.   family history includes Arthritis in her father; Breast cancer in her cousin; COPD in her sister; Depression in her mother; Depression (age of onset: 52) in her sister; Diabetes in her maternal grandmother; Drug abuse in her sister; Early death in her maternal grandfather, maternal grandmother, and sister; Early death (age of onset: 88) in her mother; Heart disease in her father, maternal grandfather, and mother; Hypertension in her mother; Lung cancer in her sister; Mental illness in her sister.   Review of Systems As above  Objective:   Physical Exam BP 122/80 (BP Location: Left Arm, Patient Position: Sitting, Cuff Size: Normal)   Pulse 88   Temp 97.9 F (36.6 C) (Oral)   Ht 5\' 5"  (1.651 m)   Wt 182 lb 2 oz (82.6 kg)   BMI 30.31 kg/m  No acute distress  15 minutes time spent with patient > half in counseling coordination of care

## 2018-12-06 DIAGNOSIS — K5730 Diverticulosis of large intestine without perforation or abscess without bleeding: Secondary | ICD-10-CM | POA: Insufficient documentation

## 2018-12-06 DIAGNOSIS — M6289 Other specified disorders of muscle: Secondary | ICD-10-CM | POA: Insufficient documentation

## 2018-12-06 DIAGNOSIS — K5900 Constipation, unspecified: Secondary | ICD-10-CM | POA: Insufficient documentation

## 2018-12-06 DIAGNOSIS — E739 Lactose intolerance, unspecified: Secondary | ICD-10-CM | POA: Insufficient documentation

## 2018-12-06 DIAGNOSIS — K5902 Outlet dysfunction constipation: Secondary | ICD-10-CM | POA: Insufficient documentation

## 2018-12-06 NOTE — Assessment & Plan Note (Signed)
This is probably contributing some to her constipation and I think she also sometimes has some left lower quadrant discomfort potentially related to this.

## 2018-12-06 NOTE — Assessment & Plan Note (Signed)
Handout provided to modify diet consider supplements

## 2018-12-06 NOTE — Assessment & Plan Note (Signed)
She has agreed to try pelvic floor physical therapy again.  I think that can help her and significantly improve her quality of life.

## 2018-12-06 NOTE — Assessment & Plan Note (Signed)
She will return to physical therapy

## 2018-12-06 NOTE — Assessment & Plan Note (Signed)
Overall diagnosis consistent with this but multiple other contributors as outlined

## 2018-12-07 ENCOUNTER — Ambulatory Visit: Payer: Managed Care, Other (non HMO) | Admitting: Physical Therapy

## 2018-12-09 ENCOUNTER — Ambulatory Visit: Payer: Managed Care, Other (non HMO) | Admitting: Family Medicine

## 2018-12-16 ENCOUNTER — Ambulatory Visit: Payer: Managed Care, Other (non HMO) | Admitting: Family Medicine

## 2018-12-17 ENCOUNTER — Encounter: Payer: Self-pay | Admitting: Family Medicine

## 2018-12-17 ENCOUNTER — Other Ambulatory Visit: Payer: Self-pay

## 2018-12-17 ENCOUNTER — Ambulatory Visit (INDEPENDENT_AMBULATORY_CARE_PROVIDER_SITE_OTHER): Payer: Managed Care, Other (non HMO) | Admitting: Family Medicine

## 2018-12-17 VITALS — BP 113/73 | HR 72 | Temp 97.2°F | Resp 18 | Ht 65.0 in | Wt 182.0 lb

## 2018-12-17 DIAGNOSIS — R6889 Other general symptoms and signs: Secondary | ICD-10-CM

## 2018-12-17 DIAGNOSIS — E28319 Asymptomatic premature menopause: Secondary | ICD-10-CM

## 2018-12-17 DIAGNOSIS — R61 Generalized hyperhidrosis: Secondary | ICD-10-CM | POA: Diagnosis not present

## 2018-12-17 DIAGNOSIS — R5383 Other fatigue: Secondary | ICD-10-CM

## 2018-12-17 DIAGNOSIS — Z2821 Immunization not carried out because of patient refusal: Secondary | ICD-10-CM

## 2018-12-17 DIAGNOSIS — D7589 Other specified diseases of blood and blood-forming organs: Secondary | ICD-10-CM | POA: Diagnosis not present

## 2018-12-17 DIAGNOSIS — R632 Polyphagia: Secondary | ICD-10-CM

## 2018-12-17 LAB — TSH: TSH: 1.24 u[IU]/mL (ref 0.35–4.50)

## 2018-12-17 LAB — CBC WITH DIFFERENTIAL/PLATELET
Basophils Absolute: 0.1 10*3/uL (ref 0.0–0.1)
Basophils Relative: 1 % (ref 0.0–3.0)
Eosinophils Absolute: 0.2 10*3/uL (ref 0.0–0.7)
Eosinophils Relative: 2.9 % (ref 0.0–5.0)
HCT: 41.1 % (ref 36.0–46.0)
Hemoglobin: 14.3 g/dL (ref 12.0–15.0)
Lymphocytes Relative: 17.8 % (ref 12.0–46.0)
Lymphs Abs: 1.1 10*3/uL (ref 0.7–4.0)
MCHC: 34.8 g/dL (ref 30.0–36.0)
MCV: 101.6 fl — ABNORMAL HIGH (ref 78.0–100.0)
Monocytes Absolute: 0.4 10*3/uL (ref 0.1–1.0)
Monocytes Relative: 6.9 % (ref 3.0–12.0)
Neutro Abs: 4.4 10*3/uL (ref 1.4–7.7)
Neutrophils Relative %: 71.4 % (ref 43.0–77.0)
Platelets: 217 10*3/uL (ref 150.0–400.0)
RBC: 4.04 Mil/uL (ref 3.87–5.11)
RDW: 13.2 % (ref 11.5–15.5)
WBC: 6.2 10*3/uL (ref 4.0–10.5)

## 2018-12-17 LAB — COMPREHENSIVE METABOLIC PANEL
ALT: 23 U/L (ref 0–35)
AST: 20 U/L (ref 0–37)
Albumin: 4 g/dL (ref 3.5–5.2)
Alkaline Phosphatase: 76 U/L (ref 39–117)
BUN: 14 mg/dL (ref 6–23)
CO2: 29 mEq/L (ref 19–32)
Calcium: 9 mg/dL (ref 8.4–10.5)
Chloride: 105 mEq/L (ref 96–112)
Creatinine, Ser: 0.68 mg/dL (ref 0.40–1.20)
GFR: 87.13 mL/min (ref >60.00)
Glucose, Bld: 92 mg/dL (ref 70–99)
Potassium: 4.2 mEq/L (ref 3.5–5.1)
Sodium: 139 mEq/L (ref 135–145)
Total Bilirubin: 1.2 mg/dL (ref 0.2–1.2)
Total Protein: 6.2 g/dL (ref 6.0–8.3)

## 2018-12-17 LAB — HEMOGLOBIN A1C: Hgb A1c MFr Bld: 5.3 % (ref 4.6–6.5)

## 2018-12-17 NOTE — Patient Instructions (Signed)
We will start with lab collection. We will call you with results next week on Tuesday.    It was great to see you today.

## 2018-12-17 NOTE — Progress Notes (Signed)
Kayla Rosales , 18-Jan-1956, 63 y.o., female MRN: [id number] Patient Care Team    Relationship Specialty Notifications Start End  Ma Hillock, DO PCP - General Family Medicine  01/15/18   Gatha Mayer, MD Consulting Physician Gastroenterology  01/19/18     Chief Complaint  Patient presents with  . Night Sweats    Pt is having night time sweats x2 months. Does not want flu shot today     Subjective: Pt presents for an OV with complaints of night sweats of 2 months duration.  Associated symptoms includes drenching night sweats nightly.  She also complains of heat intolerance throughout the day and increased sweating multiple times throughout the day.  She does endorse a mild increase in her gastric reflux.  She denies any fever, chills, unintentional weight loss  or bowel changes.  She denies any flush/facial redness with events or rash.  She denies any dizziness.  She endorses increased hunger, weight gain.  She reports the leg cramps she was experiencing 2 months ago have resolved.She had been seen 2 months of leg cramps cmp, vit d and tsh normal. She had mild macrocytosis in oct. 2019 100.8.  Patient reports she went through menopause and had hot flashes at that time, however these feel very different.  Depression screen PHQ 2/9 01/15/2018  Decreased Interest 0  Down, Depressed, Hopeless 0  PHQ - 2 Score 0    Allergies  Allergen Reactions  . Hydromorphone Other (See Comments)    HYPOTENSION  . Meperidine Other (See Comments)    "BP drops"  . Sulfa Antibiotics Other (See Comments)    States she does not know the reaction    Social History   Social History Narrative   Married second family - 8 kids 81 grandkids blended   Haematologist, retired Pharmacist, hospital   Caffeine 2/day   Smoker - never   EtOH - rare   No drugs   Wears her seatbelt, smoke alarm at home.   Feels safe in her relationships.   Past Medical History:  Diagnosis Date  . Allergy   . Arthritis   . Asthma    . Chicken pox   . Colon polyps   . Colon polyps   . Diverticulitis   . Early menopause occurring in patient age younger than 27 years   . GERD (gastroesophageal reflux disease)   . 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 IBS (irritable bowel syndrome)   . Internal hemorrhoids 05/2018  . Lumbar herniated disc   . 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    Past Surgical History:  Procedure Laterality Date  . COLONOSCOPY  2015   [name]- [place], [place]; 5 yr repeat recommended  . COLONOSCOPY W/ POLYPECTOMY  05/26/2018   x5 polyps  . KNEE ARTHROSCOPY Bilateral 2013   meniscus  x2  . NECK SURGERY  2018   disc fusion  . UPPER GASTROINTESTINAL ENDOSCOPY     Family History  Problem Relation Age of Onset  . Depression Mother   . Heart disease Mother   . Early death Mother 78  . Hypertension Mother   . Arthritis Father   . Heart disease Father   . Depression Sister 54  . Early death Sister   . Drug abuse Sister   . Lung cancer Sister   . Mental illness Sister   . COPD Sister   . Diabetes Maternal Grandmother   . Early death Maternal Grandmother   .  Early death Maternal Grandfather   . Heart disease Maternal Grandfather   . Breast cancer Cousin    Allergies as of 12/17/2018      Reactions   Hydromorphone Other (See Comments)   HYPOTENSION   Meperidine Other (See Comments)   "BP drops"   Sulfa Antibiotics Other (See Comments)   States she does not know the reaction      Medication List       Accurate as of December 17, 2018  9:32 AM. If you have any questions, ask your nurse or doctor.        albuterol 108 (90 Base) MCG/ACT inhaler Commonly known as: VENTOLIN HFA Inhale 1-2 puffs into the lungs every 6 (six) hours as needed for wheezing or shortness of breath.   ALIVE WOMENS 50+ PO Take by mouth Rosales.   aspirin EC 81 MG tablet Take 81 mg by mouth Rosales.   atorvastatin 40 MG tablet Commonly known as: LIPITOR Take 1 tablet (40  mg total) by mouth at bedtime.   cyanocobalamin 1000 MCG tablet Take 1,000 mcg by mouth Rosales.   D3-1000 25 MCG (1000 UT) capsule Generic drug: Cholecalciferol Take 1,000 Units by mouth Rosales.   esomeprazole 40 MG capsule Commonly known as: NEXIUM Take 1 capsule (40 mg total) by mouth Rosales at 12 noon.   Fish Oil 1000 MG Caps Take by mouth Rosales.   fluticasone 50 MCG/ACT nasal spray Commonly known as: FLONASE Place 2 sprays into both nostrils Rosales.   magnesium 30 MG tablet Take 30 mg by mouth Rosales.   Probiotic Advanced Caps Probiotic       All past medical history, surgical history, allergies, family history, immunizations andmedications were updated in the EMR today and reviewed under the history and medication portions of their EMR.     ROS: Negative, with the exception of above mentioned in HPI   Objective:  BP 113/73 (BP Location: Right Arm, Patient Position: Sitting, Cuff Size: Normal)   Pulse 72   Temp (!) 97.2 F (36.2 C) (Temporal)   Resp 18   Ht _0  (1.651 m)   Wt 182 lb (82.6 kg)   SpO2 97%   BMI 30.29 kg/m  Body mass index is 30.29 kg/m. Gen: Afebrile. No acute distress. Nontoxic in appearance, well developed, well nourished.  HENT: AT. [place].  MMM, no oral lesions.  No cough or shortness of breath.   Eyes:Pupils Equal Round Reactive to light, Extraocular movements intact,  Conjunctiva without redness, discharge or icterus. Neck/lymp/endocrine: Supple, no lymphadenopathy, no thyromegaly CV: RRR no murmur, no edema Chest: CTAB, no wheeze or crackles. Good air movement, normal resp effort.  Abd: Soft. NTND. BS present.  No masses palpated. No rebound or guarding.  Skin: No rashes, purpura or petechiae.  Neuro:  Normal gait. PERLA. EOMi. Alert. Oriented x3  Psych: Normal affect, dress and demeanor. Normal speech. Normal thought content and judgment.  No exam data present No results found. No results found for this or any previous visit (from the  past 24 hour(s)).  Assessment/Plan: Kayla Rosales is a 63 y.o. female present for OV for  Influenza vaccination declined  Macrocytosis without anemia Repeat today- all other indices have been normal will mild increase in the MCV 01/2018 - CBC w/Diff  Heat intolerance/Sweat, sweating, excessive/Night sweats/polyphagia/fatigue - unknown etiology of her symptoms. Discussed there are many possible causes. Will r/o diabetes, thyroid and check CBC and CMP - she has a known h/o reflux , which is  known to potentially cause like symptoms. She is on a reflux regimen and may need to be evaluated by her GI depending results. - add pepcid to regimen.  - Hemoglobin A1c - TSH - Comp Met (CMET) - CBC w/Diff    Reviewed expectations re: course of current medical issues.  Discussed self-management of symptoms.  Outlined signs and symptoms indicating need for more acute intervention.  Patient verbalized understanding and all questions were answered.  Patient received an After-Visit Summary.   > 25 minutes spent with patient, >50% of time spent face to face    No orders of the defined types were placed in this encounter.    Note is dictated utilizing voice recognition software. Although note has been proof read prior to signing, occasional typographical errors still can be missed. If any questions arise, please do not hesitate to call for verification.   electronically signed by:  Howard Pouch, DO  [place]

## 2018-12-23 ENCOUNTER — Encounter: Payer: Self-pay | Admitting: Family Medicine

## 2018-12-23 ENCOUNTER — Telehealth: Payer: Self-pay | Admitting: Family Medicine

## 2018-12-23 DIAGNOSIS — D7589 Other specified diseases of blood and blood-forming organs: Secondary | ICD-10-CM

## 2018-12-23 DIAGNOSIS — R61 Generalized hyperhidrosis: Secondary | ICD-10-CM | POA: Insufficient documentation

## 2018-12-23 DIAGNOSIS — R5383 Other fatigue: Secondary | ICD-10-CM

## 2018-12-23 MED ORDER — FAMOTIDINE 40 MG PO TABS: 40.0000 mg | ORAL_TABLET | Freq: Every day | ORAL | 0 refills | Status: DC

## 2018-12-23 NOTE — Telephone Encounter (Signed)
Pt called back and stated that her dentist has told she could possibly Apnea and did not know if that could be the cause of some of these issues. She never went for testing  But thought now may be a good time to get this done. Please advise.

## 2018-12-23 NOTE — Telephone Encounter (Signed)
Pt was called and message was left to return call  

## 2018-12-23 NOTE — Telephone Encounter (Signed)
Pt was called and given all information/instructions. She verbalized understanding. Appt was made for labs and F/U appt.

## 2018-12-23 NOTE — Telephone Encounter (Signed)
Please inform patient the following information: Her thyroid, liver, kidneys and electrolytes are all normal. Her glucose is normal and her diabetes screen is normal-negative for diabetes. Her cell counts are normal, however the size of the red blood cell continues to slowly enlarge.  This was first noted last October with a very mild elevation in the size of her red blood cell, and on repeat labs collected last week it has increased again just mildly from 100.8 to 101.6 (nl  <100).  There is potential the changes in her red blood cell size is a early change to a disease process going on in her body that is now also causing the increase in sweating and fatigue, however we normally see additional changes in blood counts when that is the case--- and she does not have additional changes in her blood counts. Abnormal levels of B vitamins can cause an increase in the size of red blood cells.  Reflux symptoms can cause heat intolerance, and the medication for reflux can cause B  vitamin deficiency. -I have called in Pepcid to add to her Nexium regimen for her reflux.  And I would encourage her to discuss this with her gastroenterologist if the Nexium/Pepcid combination does not make a notable change in her heat intolerance within the next 4 weeks. -In addition I would like to check her B12 and folate levels again to see if possible cause of her increasing red blood cell size.  I also ordered the start of infectious disease work-up that is typically associated with fatigue and night sweats.  Which include Lyme titers, hepatitis panel and HIV.  Please schedule her for lab appointment for the above labs and also schedule her for a follow-up 1 week after lab appointment to review all lab results.  Can be virtually.

## 2018-12-24 NOTE — Telephone Encounter (Signed)
We can discuss that further at her follow-up in a week after the additional labs are collected. Sleep apnea would not be responsible for her all of her current symptoms.  But we can discuss further and refer for testing if indicated at her upcoming appointment.

## 2018-12-24 NOTE — Telephone Encounter (Signed)
Pt was called and given information.  

## 2018-12-28 ENCOUNTER — Other Ambulatory Visit: Payer: Self-pay

## 2018-12-28 ENCOUNTER — Ambulatory Visit (INDEPENDENT_AMBULATORY_CARE_PROVIDER_SITE_OTHER): Payer: Managed Care, Other (non HMO) | Admitting: Family Medicine

## 2018-12-28 DIAGNOSIS — D7589 Other specified diseases of blood and blood-forming organs: Secondary | ICD-10-CM

## 2018-12-28 DIAGNOSIS — R61 Generalized hyperhidrosis: Secondary | ICD-10-CM

## 2018-12-28 DIAGNOSIS — R5383 Other fatigue: Secondary | ICD-10-CM | POA: Diagnosis not present

## 2018-12-28 LAB — VITAMIN B12: Vitamin B-12: 741 pg/mL (ref 211–911)

## 2018-12-28 LAB — FOLATE: Folate: 16 ng/mL (ref >5.9)

## 2018-12-29 LAB — HIV ANTIBODY (ROUTINE TESTING W REFLEX): HIV 1&2 Ab, 4th Generation: NONREACTIVE

## 2018-12-29 LAB — HEPATITIS PANEL, ACUTE
Hep A IgM: NONREACTIVE
Hep B C IgM: NONREACTIVE
Hepatitis B Surface Ag: NONREACTIVE
Hepatitis C Ab: NONREACTIVE
SIGNAL TO CUT-OFF: 0.01 (ref <1.00)

## 2018-12-29 LAB — B. BURGDORFI ANTIBODIES: B burgdorferi Ab IgG+IgM: <0.9 index

## 2018-12-30 ENCOUNTER — Telehealth: Payer: Self-pay | Admitting: Family Medicine

## 2018-12-30 NOTE — Telephone Encounter (Signed)
Please inform patient the following information: The Lyme titer is negative.  Her hepatitis panel was negative.  The HIV screening is negative. Vitamin B12 and folate look great. Keep follow-up appointment for reevaluation on her symptoms that is coming up at the end of this month so we can evaluate further.

## 2018-12-31 NOTE — Telephone Encounter (Signed)
Pt was called and given information, she verbalized understanding  

## 2019-01-04 ENCOUNTER — Encounter: Payer: Self-pay | Admitting: Family Medicine

## 2019-01-04 ENCOUNTER — Ambulatory Visit (INDEPENDENT_AMBULATORY_CARE_PROVIDER_SITE_OTHER): Payer: Managed Care, Other (non HMO) | Admitting: Family Medicine

## 2019-01-04 ENCOUNTER — Other Ambulatory Visit: Payer: Self-pay

## 2019-01-04 VITALS — Ht 65.0 in

## 2019-01-04 DIAGNOSIS — R5383 Other fatigue: Secondary | ICD-10-CM | POA: Diagnosis not present

## 2019-01-04 DIAGNOSIS — E669 Obesity, unspecified: Secondary | ICD-10-CM

## 2019-01-04 DIAGNOSIS — D7589 Other specified diseases of blood and blood-forming organs: Secondary | ICD-10-CM

## 2019-01-04 DIAGNOSIS — R61 Generalized hyperhidrosis: Secondary | ICD-10-CM

## 2019-01-04 DIAGNOSIS — R0683 Snoring: Secondary | ICD-10-CM | POA: Diagnosis not present

## 2019-01-04 HISTORY — DX: Snoring: R06.83

## 2019-01-04 NOTE — Addendum Note (Signed)
Addended by: Ralph Dowdy on: 01/04/2019 01:29 PM   Modules accepted: Orders

## 2019-01-04 NOTE — Progress Notes (Signed)
VIRTUAL VISIT VIA VIDEO  I connected with Kayla Rosales on 01/04/19 at  8:30 AM EDT by a video enabled telemedicine application and verified that I am speaking with the correct person using two identifiers. Location patient: Home Location provider: [facility], Office Persons participating in the virtual visit: Patient, Dr. Raoul Pitch and R.Baker, LPN  I discussed the limitations of evaluation and management by telemedicine and the availability of in person appointments. The patient expressed understanding and agreed to proceed.     Kayla Rosales , 1956-03-23, 63 y.o., female MRN: [id number] Patient Care Team    Relationship Specialty Notifications Start End  Ma Hillock, DO PCP - General Family Medicine  01/15/18   Gatha Mayer, MD Consulting Physician Gastroenterology  01/19/18     Chief Complaint  Patient presents with   Follow-up    Review labs      Subjective: .nameis a 63 y.o. female present today to discuss her labs and fatigue, increased sweating and snoring. Body mass index is 30.29 kg/m. Patient presents today for follow-up and reports that she is seeing small improvement in her symptoms, but they are not resolved.  She still having increased sweats with activity, she is still experiencing night sweats although intermittent.  Interestingly, she states when she stayed at her family's house on 2 different occasions since she was last seen she had no symptoms during her stay away from the home.  She had a few days of constipation, which she felt more fatigued during this time but was able to improve her symptoms with over-the-counter regimens.  She does endorse snoring, which is rather significant by her family's reports.  She also states a few of her family members also have obstructive sleep apnea.  She does not consume alcohol on a routine basis.  She states maybe once or twice a month she will have a serving of alcohol.  Her B12, folate, Lyme, hepatitis panel, HIV and  A1c were all normal.  Her CBC was normal with the exception of an increasing MCV.   Prior note: Pt presents for an OV with complaints of night sweats of 2 months duration.  Associated symptoms includes drenching night sweats nightly.  She also complains of heat intolerance throughout the day and increased sweating multiple times throughout the day.  She does endorse a mild increase in her gastric reflux.  She denies any fever, chills, unintentional weight loss  or bowel changes.  She denies any flush/facial redness with events or rash.  She denies any dizziness.  She endorses increased hunger, weight gain.  She reports the leg cramps she was experiencing 2 months ago have resolved.She had been seen 2 months of leg cramps cmp, vit d and tsh normal. She had mild macrocytosis in oct. 2019 100.8.  Patient reports she went through menopause and had hot flashes at that time, however these feel very different.  Depression screen PHQ 2/9 01/15/2018  Decreased Interest 0  Down, Depressed, Hopeless 0  PHQ - 2 Score 0    Allergies  Allergen Reactions   Hydromorphone Other (See Comments)    HYPOTENSION   Meperidine Other (See Comments)    "BP drops"   Sulfa Antibiotics Other (See Comments)    States she does not know the reaction    Social History   Social History Narrative   Married second family - 8 kids 67 grandkids blended   Haematologist, retired Pharmacist, hospital   Caffeine 2/day   Smoker -  never   EtOH - rare   No drugs   Wears her seatbelt, smoke alarm at home.   Feels safe in her relationships.   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    Past Surgical History:  Procedure Laterality Date   [facility] [place] [place]   COLONOSCOPY W/ POLYPECTOMY  05/26/2018   x5 polyps   KNEE ARTHROSCOPY Bilateral 2013   meniscus  x2   NECK SURGERY  2018   disc fusion   UPPER GASTROINTESTINAL ENDOSCOPY     Family History  Problem Relation Age of Onset   Depression Mother    Heart disease Mother    Early death Mother 26   Hypertension Mother    Arthritis Father    Heart disease Father    Depression Sister 81   Early death Sister    Drug abuse Sister    Lung cancer Sister    Mental illness Sister    COPD Sister    Diabetes Maternal Grandmother    Early death Maternal Grandmother    Early death Maternal Grandfather    Heart disease Maternal Grandfather    Breast cancer Cousin    Allergies as of 01/04/2019      Reactions   Hydromorphone Other (See Comments)   HYPOTENSION   Meperidine Other (See Comments)   "BP drops"   Sulfa Antibiotics Other (See Comments)   States she does not know the reaction      Medication List       Accurate as of January 04, 2019  9:09 AM. If you have any questions, ask your nurse or doctor.        albuterol 108 (90 Base) MCG/ACT inhaler Commonly known as: VENTOLIN HFA Inhale 1-2 puffs into the lungs every 6 (six) hours as needed for wheezing or shortness of breath.   ALIVE WOMENS 50+ PO Take by mouth daily.   aspirin EC 81 MG tablet Take 81 mg by mouth daily.   atorvastatin 40 MG tablet Commonly known as: LIPITOR Take 1 tablet (40 mg total) by mouth at bedtime.   cyanocobalamin 1000 MCG tablet Take 1,000 mcg by mouth daily.   D3-1000 25 MCG (1000 UT) capsule Generic drug: Cholecalciferol Take 1,000 Units by mouth daily.   esomeprazole 40 MG capsule Commonly known as: NEXIUM Take 1 capsule (40 mg total) by mouth daily at 12 noon.     famotidine 40 MG tablet Commonly known as: Pepcid Take 1 tablet (40 mg total) by mouth daily.   Fish Oil 1000 MG Caps Take by mouth daily.   fluticasone 50 MCG/ACT nasal spray Commonly known as: FLONASE Place 2 sprays into both nostrils daily.   loratadine 10 MG tablet Commonly known as: CLARITIN Take 10 mg by mouth daily.   magnesium 30 MG tablet Take 30 mg by mouth daily.   Probiotic Advanced Caps Probiotic       All past medical  history, surgical history, allergies, family history, immunizations andmedications were updated in the EMR today and reviewed under the history and medication portions of their EMR.     ROS: Negative, with the exception of above mentioned in HPI   Objective:  Ht 5\' 5"  (1.651 m)    BMI 30.29 kg/m  Body mass index is 30.29 kg/m. Gen: Afebrile. No acute distress.  HENT: AT. [place]. Eyes:Pupils Equal Round Reactive to light, Extraocular movements intact,  Conjunctiva without redness, discharge or icterus. Chest: no cough or shortness of breath  Neuro:  Alert. Oriented.  Psych: Normal affect, dress and demeanor. Normal speech. Normal thought content and judgment.  No exam data present No results found. No results found for this or any previous visit (from the past 24 hour(s)).  Assessment/Plan: Jersie Tosto is a 63 y.o. female present for OV for  Macrocytosis without anemia  all other indices have been normal will mild increase in the MCV 01/2018 @100 .8.>> rpt 12/17/2018>> increased to 101.6.  - discussed common causes of macrocytosis with her today. She does not drink Alcohol. Labs have been unrevealing up to date. - will schedule her for lab appt for iron panel to rule out as cause.   Snoring/fatigue: -referral to pulm for eval and sleep study if appropriate. Intermediate risk and dentist reported seeing signs with her exam.  - snores significantly per her family.  - Body mass index is 30.29 kg/m. - > 28 year old.   Heat  intolerance/Sweat, sweating, excessive/Night sweats/polyphagia/fatigue - unknown etiology of her symptoms. Discussed there are many possible causes. She seems to have mild relief in her symptoms but no resolved. Interesting enough, she reported no symptoms when staying at her families house.  - CBC macrocytosis. CMP, TSH ,A1c,  B12, folate >> WNL 12/17/2018. - Screenings UTD.  - she has a known h/o reflux , which is known to potentially cause like symptoms. Added pepcid last visit> which may or may  Not been as helpful.   - f/u provider in 3-4 mos with CBC repeat. If MCV continues to rise will refer to heme>> pt agrees with plan.    Reviewed expectations re: course of current medical issues.  Discussed self-management of symptoms.  Outlined signs and symptoms indicating need for more acute intervention.  Patient verbalized understanding and all questions were answered.  Patient received an After-Visit Summary.   > 25 minutes spent with patient, >50% of time spent face to face      Orders Placed This Encounter  Procedures   Iron, TIBC and Ferritin Panel   Ambulatory referral to Pulmonology     Note is dictated utilizing voice recognition software. Although note has been proof read prior to signing, occasional typographical errors still can be missed. If any questions arise, please do not hesitate to call for verification.   electronically signed by:  Howard Pouch, DO  [place]

## 2019-01-06 ENCOUNTER — Ambulatory Visit (INDEPENDENT_AMBULATORY_CARE_PROVIDER_SITE_OTHER): Payer: Managed Care, Other (non HMO) | Admitting: Family Medicine

## 2019-01-06 ENCOUNTER — Other Ambulatory Visit: Payer: Self-pay

## 2019-01-06 DIAGNOSIS — D7589 Other specified diseases of blood and blood-forming organs: Secondary | ICD-10-CM

## 2019-01-06 DIAGNOSIS — R5383 Other fatigue: Secondary | ICD-10-CM

## 2019-01-06 DIAGNOSIS — R61 Generalized hyperhidrosis: Secondary | ICD-10-CM

## 2019-01-07 ENCOUNTER — Telehealth: Payer: Self-pay | Admitting: Family Medicine

## 2019-01-07 LAB — IRON,TIBC AND FERRITIN PANEL
%SAT: 28 % (calc) (ref 16–45)
Ferritin: 148 ng/mL (ref 16–288)
Iron: 81 ug/dL (ref 45–160)
TIBC: 292 mcg/dL (calc) (ref 250–450)

## 2019-01-07 NOTE — Telephone Encounter (Signed)
Please inform patient the following information: Her iron levels are normal.  I did refer her to pulmonology for eval/sleep study- they should be calling her, if they have not already to schedule.  F/U here in 3-4 mso for recheck

## 2019-01-07 NOTE — Telephone Encounter (Signed)
Pt was called and given information, she verbalized understanding  

## 2019-01-12 ENCOUNTER — Encounter: Payer: Self-pay | Admitting: Gynecology

## 2019-01-15 ENCOUNTER — Encounter: Payer: Self-pay | Admitting: Gastroenterology

## 2019-01-15 ENCOUNTER — Ambulatory Visit (INDEPENDENT_AMBULATORY_CARE_PROVIDER_SITE_OTHER): Payer: Managed Care, Other (non HMO) | Admitting: Gastroenterology

## 2019-01-15 VITALS — BP 128/80 | HR 75 | Temp 97.6°F | Ht 64.5 in | Wt 182.2 lb

## 2019-01-15 DIAGNOSIS — K581 Irritable bowel syndrome with constipation: Secondary | ICD-10-CM

## 2019-01-15 DIAGNOSIS — M6289 Other specified disorders of muscle: Secondary | ICD-10-CM | POA: Diagnosis not present

## 2019-01-15 DIAGNOSIS — K9041 Non-celiac gluten sensitivity: Secondary | ICD-10-CM

## 2019-01-15 NOTE — Progress Notes (Signed)
01/15/2019 Kayla Rosales [id number] 1955/11/06   HISTORY OF PRESENT ILLNESS: This is a 63 year old female who is a patient of Dr. Celesta Aver with IBS-C, pelvic floor dysfunction, nonceliac gluten sensitivity, and likely some lactose intolerance.  She was just seen by him on December 01, 2018.  Please see that note for further details as well.  She presents here today with ongoing complaints of constipation, bloating, general not well feeling in her gut.  Follows a completely gluten-free diet and takes a probiotic.  Does not like pelvic floor PT and does not feel like it helped much.  Colonoscopy by Dr. Carlean Purl earlier this year on February 11 showed the following:  - Five 1 to 6 mm polyps in the rectum, in the transverse colon and at the appendiceal orifice, removed with a cold snare. Resected and retrieved. - Severe diverticulosis in the sigmoid colon. There was narrowing of the colon in association with the diverticular opening. - Mild diverticulosis in the right colon. - Internal hemorrhoids. - The examination was otherwise normal on direct and retroflexion views.  Polyps were sessile serrated polyps on pathology.  5 year recall recommended.  Past Medical History:  Diagnosis Date  . Allergy   . Arthritis   . Asthma   . Chicken pox   . Colon polyps   . Colon polyps   . Diverticulitis   . Early menopause occurring in patient age younger than 40 years   . GERD (gastroesophageal reflux disease)   . 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 IBS (irritable bowel syndrome)   . Internal hemorrhoids 05/2018  . Lumbar herniated disc   . 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    Past Surgical History:  Procedure Laterality Date  . COLONOSCOPY  2015   [name]- [place], [place]; 5 yr repeat recommended  . COLONOSCOPY W/ POLYPECTOMY  05/26/2018   x5 polyps  . KNEE ARTHROSCOPY Bilateral 2013   meniscus  x2  . NECK SURGERY  2018   disc fusion  . UPPER  GASTROINTESTINAL ENDOSCOPY      reports that she has never smoked. She has never used smokeless tobacco. She reports current alcohol use of about 1.0 standard drinks of alcohol per week. She reports that she does not use drugs. family history includes Arthritis in her father; Breast cancer in her cousin; COPD in her sister; Depression in her mother; Depression (age of onset: 50) in her sister; Diabetes in her maternal grandmother; Drug abuse in her sister; Early death in her maternal grandfather, maternal grandmother, and sister; Early death (age of onset: 45) in her mother; Heart disease in her father, maternal grandfather, and mother; Hypertension in her mother; Lung cancer in her sister; Mental illness in her sister. Allergies  Allergen Reactions  . Hydromorphone Other (See Comments)    HYPOTENSION  . Meperidine Other (See Comments)    "BP drops"  . Sulfa Antibiotics Other (See Comments)    States she does not know the reaction       Outpatient Encounter Medications as of 01/15/2019  Medication Sig  . albuterol (PROVENTIL HFA;VENTOLIN HFA) 108 (90 Base) MCG/ACT inhaler Inhale 1-2 puffs into the lungs every 6 (six) hours as needed for wheezing or shortness of breath.  Marland Kitchen aspirin EC 81 MG tablet Take 81 mg by mouth daily.  Marland Kitchen atorvastatin (LIPITOR) 40 MG tablet Take 1 tablet (40 mg total) by mouth at bedtime.  . Cholecalciferol (D3-1000) 1000 units capsule Take  1,000 Units by mouth daily.  . cyanocobalamin 1000 MCG tablet Take 1,000 mcg by mouth daily.  Marland Kitchen esomeprazole (NEXIUM) 40 MG capsule Take 1 capsule (40 mg total) by mouth daily at 12 noon.  . famotidine (PEPCID) 40 MG tablet Take 1 tablet (40 mg total) by mouth daily.  . fluticasone (FLONASE) 50 MCG/ACT nasal spray Place 2 sprays into both nostrils daily.  Marland Kitchen loratadine (CLARITIN) 10 MG tablet Take 10 mg by mouth daily.  . magnesium 30 MG tablet Take 30 mg by mouth daily.  . Multiple Vitamins-Minerals (ALIVE WOMENS 50+ PO) Take by  mouth daily.  . Omega-3 Fatty Acids (FISH OIL) 1000 MG CAPS Take by mouth daily.  . Probiotic Product (PROBIOTIC ADVANCED) CAPS Probiotic   No facility-administered encounter medications on file as of 01/15/2019.      REVIEW OF SYSTEMS  : All other systems reviewed and negative except where noted in the History of Present Illness.   PHYSICAL EXAM: BP 128/80 (BP Location: Left Arm, Patient Position: Sitting)   Pulse 75   Temp 97.6 F (36.4 C)   Ht 5' 4.5" (1.638 m) Comment: measured without shoes  Wt 182 lb 3.2 oz (82.6 kg)   SpO2 98%   BMI 30.79 kg/m  General: Well developed white female in no acute distress Head: Normocephalic and atraumatic Eyes:  Sclerae anicteric, conjunctiva pink. Ears: Normal auditory acuity Lungs: Clear throughout to auscultation; no increased WOB. Heart: Regular rate and rhythm; no M/R/G. Abdomen: Soft, non-distended.  BS present.  Non-tender. Musculoskeletal: Symmetrical with no gross deformities  Skin: No lesions on visible extremities Extremities: No edema  Neurological: Alert oriented x 4, grossly non-focal Psychological:  Alert and cooperative. Normal mood and affect  ASSESSMENT AND PLAN: *63 year old female with IBS-C, pelvic floor dysfunction, nonceliac gluten sensitivity, and likely some lactose intolerance.  Complains of constipation, bloating, generally not feeling good in her gut.  I had a long conversation with the patient today regarding her symptoms, diagnoses, and options for treatment.  Ultimately we decided for her to take MiraLAX daily as well as a powder fiber supplement such as Benefiber or Citrucel daily, 2 tablespoons mixed in 8 ounces of liquid.  We discussed the FODMAP diet, but she declined since she already follows a gluten-free diet.  Samples of IBgard given to try.  Will follow-up with Dr. Carlean Purl in 4-6 weeks.  ? Need for SIBO testing and treatment.  **25 minutes spent with the patient, at least 50% of which was spent in  discussion of her symptoms, diagnoses, and option for treatments.   CC:  Kuneff, [name], DO

## 2019-01-15 NOTE — Patient Instructions (Signed)
Start IBgard samples as directed on package. This can be purchased over the counter.   Start Benefiber 2 tablespoons daily.   Take Miralax mixing 17 grams in 8 oz of water/juice daily.

## 2019-01-29 ENCOUNTER — Encounter: Payer: Self-pay | Admitting: Gastroenterology

## 2019-02-15 NOTE — Telephone Encounter (Signed)
Patient scheduled appt to come in tomorrow for chol meds, flu shot and shingles shot  Thank you

## 2019-02-16 ENCOUNTER — Ambulatory Visit (INDEPENDENT_AMBULATORY_CARE_PROVIDER_SITE_OTHER): Payer: Managed Care, Other (non HMO) | Admitting: Family Medicine

## 2019-02-16 ENCOUNTER — Ambulatory Visit: Payer: Managed Care, Other (non HMO)

## 2019-02-16 ENCOUNTER — Encounter: Payer: Self-pay | Admitting: Family Medicine

## 2019-02-16 ENCOUNTER — Other Ambulatory Visit: Payer: Self-pay

## 2019-02-16 VITALS — BP 111/73 | HR 75 | Temp 97.4°F | Resp 17 | Ht 65.0 in | Wt 183.4 lb

## 2019-02-16 DIAGNOSIS — Z8673 Personal history of transient ischemic attack (TIA), and cerebral infarction without residual deficits: Secondary | ICD-10-CM

## 2019-02-16 DIAGNOSIS — R011 Cardiac murmur, unspecified: Secondary | ICD-10-CM | POA: Diagnosis not present

## 2019-02-16 DIAGNOSIS — Z23 Encounter for immunization: Secondary | ICD-10-CM | POA: Diagnosis not present

## 2019-02-16 DIAGNOSIS — E785 Hyperlipidemia, unspecified: Secondary | ICD-10-CM | POA: Diagnosis not present

## 2019-02-16 DIAGNOSIS — J4520 Mild intermittent asthma, uncomplicated: Secondary | ICD-10-CM

## 2019-02-16 DIAGNOSIS — E669 Obesity, unspecified: Secondary | ICD-10-CM

## 2019-02-16 LAB — LIPID PANEL
Cholesterol: 108 mg/dL (ref 0–200)
HDL: 33.9 mg/dL — ABNORMAL LOW (ref >39.00)
LDL Cholesterol: 50 mg/dL (ref 0–99)
NonHDL: 74.04
Total CHOL/HDL Ratio: 3
Triglycerides: 121 mg/dL (ref 0.0–149.0)
VLDL: 24.2 mg/dL (ref 0.0–40.0)

## 2019-02-16 MED ORDER — ALBUTEROL SULFATE HFA 108 (90 BASE) MCG/ACT IN AERS: 1.0000 | INHALATION_SPRAY | Freq: Four times a day (QID) | RESPIRATORY_TRACT | 5 refills | Status: DC | PRN

## 2019-02-16 MED ORDER — FLUTICASONE PROPIONATE 50 MCG/ACT NA SUSP: 2.0000 | Freq: Every day | NASAL | 6 refills | Status: DC

## 2019-02-16 MED ORDER — ATORVASTATIN CALCIUM 40 MG PO TABS: 40.0000 mg | ORAL_TABLET | Freq: Every day | ORAL | 3 refills | Status: DC

## 2019-02-16 MED ORDER — FAMOTIDINE 40 MG PO TABS: 40.0000 mg | ORAL_TABLET | Freq: Every day | ORAL | 3 refills | Status: DC

## 2019-02-16 NOTE — Progress Notes (Signed)
Kayla Rosales , [birth year], 63 y.o., female MRN: [id number] Patient Care Team    Relationship Specialty Notifications Start End  Kayla Hillock, DO PCP - General Family Medicine  01/15/18   Kayla Mayer, MD Consulting Physician Gastroenterology  01/19/18     Chief Complaint  Patient presents with  . Hyperlipidemia    CMC, pt has not had cholestorol checked in over a year. Not fasting.      Subjective: Kayla Rosales is a 64 y.o. female present today  Hyperlipidemia/h/o stroke/murmur:  Patient suffered from a stroke in 2017.  Since that time she has been on atorvastatin.  She is present today to have her cholesterol levels checked.  Depression screen PHQ 2/9 01/15/2018  Decreased Interest 0  Down, Depressed, Hopeless 0  PHQ - 2 Score 0    Allergies  Allergen Reactions  . Hydromorphone Other (See Comments)    HYPOTENSION  . Meperidine Other (See Comments)    "BP drops"  . Sulfa Antibiotics Other (See Comments)    States she does not know the reaction    Social History   Social History Narrative   Married second family - 8 kids 62 grandkids blended   Haematologist, retired Pharmacist, hospital   Caffeine 2/day   Smoker - never   EtOH - rare   No drugs   Wears her seatbelt, smoke alarm at home.   Feels safe in her relationships.   Past Medical History:  Diagnosis Date  . Allergy   . Arthritis   . Asthma   . Chicken pox   . Colon polyps   . Diverticulitis   . Early menopause occurring in patient age younger than 73 years   . GERD (gastroesophageal reflux disease)   . 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 IBS (irritable bowel syndrome)   . Internal hemorrhoids 05/2018  . Lumbar herniated disc   . 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    Past Surgical History:  Procedure Laterality Date  . COLONOSCOPY  2015   [name]- [place], [place]; 5 yr repeat recommended  . COLONOSCOPY W/ POLYPECTOMY  05/26/2018   x5 polyps  . KNEE ARTHROSCOPY  Bilateral 2013   meniscus  x2  . NECK SURGERY  2018   disc fusion  . UPPER GASTROINTESTINAL ENDOSCOPY     Family History  Problem Relation Age of Onset  . Depression Mother   . Heart disease Mother   . Early death Mother 81  . Hypertension Mother   . Arthritis Father   . Heart disease Father   . Depression Sister 107  . Early death Sister   . Drug abuse Sister   . Lung cancer Sister   . Mental illness Sister   . COPD Sister   . Diabetes Maternal Grandmother   . Early death Maternal Grandmother   . Early death Maternal Grandfather   . Heart disease Maternal Grandfather   . Breast cancer Cousin    Allergies as of 02/16/2019      Reactions   Hydromorphone Other (See Comments)   HYPOTENSION   Meperidine Other (See Comments)   "BP drops"   Sulfa Antibiotics Other (See Comments)   States she does not know the reaction      Medication List       Accurate as of February 16, 2019  5:12 PM. If you have any questions, ask your nurse or doctor.  albuterol 108 (90 Base) MCG/ACT inhaler Commonly known as: VENTOLIN HFA Inhale 1-2 puffs into the lungs every 6 (six) hours as needed for wheezing or shortness of breath.   ALIVE WOMENS 50+ PO Take by mouth daily.   aspirin EC 81 MG tablet Take 81 mg by mouth daily.   atorvastatin 40 MG tablet Commonly known as: LIPITOR Take 1 tablet (40 mg total) by mouth at bedtime.   cyanocobalamin 1000 MCG tablet Take 1,000 mcg by mouth daily.   D3-1000 25 MCG (1000 UT) capsule Generic drug: Cholecalciferol Take 1,000 Units by mouth daily.   esomeprazole 40 MG capsule Commonly known as: NEXIUM Take 1 capsule (40 mg total) by mouth daily at 12 noon.   famotidine 40 MG tablet Commonly known as: Pepcid Take 1 tablet (40 mg total) by mouth daily. What changed:   when to take this  reasons to take this   Fish Oil 1000 MG Caps Take by mouth daily.   fluticasone 50 MCG/ACT nasal spray Commonly known as: FLONASE Place 2  sprays into both nostrils daily.   loratadine 10 MG tablet Commonly known as: CLARITIN Take 10 mg by mouth daily.   magnesium 30 MG tablet Take 30 mg by mouth daily.   Probiotic Advanced Caps Probiotic       All past medical history, surgical history, allergies, family history, immunizations andmedications were updated in the EMR today and reviewed under the history and medication portions of their EMR.     ROS: Negative, with the exception of above mentioned in HPI   Objective:  BP 111/73 (BP Location: Right Arm, Patient Position: Sitting, Cuff Size: Normal)   Pulse 75   Temp (!) 97.4 F (36.3 C) (Temporal)   Resp 17   Ht 5\' 5"  (1.651 m)   Wt 183 lb 6 oz (83.2 kg)   SpO2 96%   BMI 30.52 kg/m  Body mass index is 30.52 kg/m. Gen: Afebrile. No acute distress.  HENT: AT. [name].  No cough or shortness of breath Eyes:Pupils Equal Round Reactive to light, Extraocular movements intact,  Conjunctiva without redness, discharge or icterus. Neck/lymp/endocrine: Supple, no lymphadenopathy, no thyromegaly CV: RRR 1/6 systolic murmur, no edema, +2/4 P posterior tibialis pulses Chest: CTAB, no wheeze or crackles  Neuro: Normal gait. PERLA. EOMi. Alert. Oriented x3  Psych: Normal affect, dress and demeanor. Normal speech. Normal thought content and judgment.    No exam data present No results found. Results for orders placed or performed in visit on 02/16/19 (from the past 24 hour(s))  Lipid panel     Status: Abnormal   Collection Time: 02/16/19 10:09 AM  Result Value Ref Range   Cholesterol 108 0 - 200 mg/dL   Triglycerides 121.0 0.0 - 149.0 mg/dL   HDL 33.90 (L) >39.00 mg/dL   VLDL 24.2 0.0 - 40.0 mg/dL   LDL Cholesterol 50 0 - 99 mg/dL   Total CHOL/HDL Ratio 3    NonHDL 74.04     Assessment/Plan: Kayla Rosales is a 63 y.o. female present for OV for  Snoring/fatigue: -referral to pulm for eval and sleep She has her appt scheduled this coming week. - Body mass index is 30.52  kg/m. - > 30 year old.   Hyperlipidemia/history of stroke/murmur Lipids collected today.  Patient is not fasting. Refills provided on atorvastatin. Continue baby aspirin. Murmur was appreciated today on exam.  She has been asymptomatic no chest pain or dizziness.  She is noticing some palpitations infrequently.  She reports  another provider telling her she had a slight murmur in the past.  This is the first time this provider has appreciated the murmur.  Discussed options with her and she is agreeable to monitor for any signs or symptoms, and if occur would then proceed with echocardiogram.  Asthma: Well-controlled.  Refilled Flonase and albuterol for her today.  Influenza and shingrix #1 completed today.   Follow-up in 2.5 months in which Shingrix series will be completed and follow-up on her macrocytosis/CBC.   Reviewed expectations re: course of current medical issues.  Discussed self-management of symptoms.  Outlined signs and symptoms indicating need for more acute intervention.  Patient verbalized understanding and all questions were answered.  Patient received an After-Visit Summary.   > 25 minutes spent with patient, >50% of time spent face to face      Orders Placed This Encounter  Procedures  . Varicella-zoster vaccine IM  . Flu Vaccine QUAD 36+ mos IM  . Lipid panel   Note is dictated utilizing voice recognition software. Although note has been proof read prior to signing, occasional typographical errors still can be missed. If any questions arise, please do not hesitate to call for verification.   electronically signed by:  Howard Pouch, DO  [place]

## 2019-02-16 NOTE — Patient Instructions (Signed)
I will call you with the results. I have refilled your meds for you.    Preventing High Cholesterol Cholesterol is a white, waxy substance similar to fat that the human body needs to help build cells. The liver makes all the cholesterol that a person's body needs. Having high cholesterol (hypercholesterolemia) increases a person's risk for heart disease and stroke. Extra (excess) cholesterol comes from the food the person eats. High cholesterol can often be prevented with diet and lifestyle changes. If you already have high cholesterol, you can control it with diet and lifestyle changes and with medicine. How can high cholesterol affect me? If you have high cholesterol, deposits (plaques) may build up on the walls of your arteries. The arteries are the blood vessels that carry blood away from your heart. Plaques make the arteries narrower and stiffer. This can limit or block blood flow and cause blood clots to form. Blood clots:  Are tiny balls of cells that form in your blood.  Can move to the heart or brain, causing a heart attack or stroke. Plaques in arteries greatly increase your risk for heart attack and stroke.Making diet and lifestyle changes can reduce your risk for these conditions that may threaten your life. What can increase my risk? This condition is more likely to develop in people who:  Eat foods that are high in saturated fat or cholesterol. Saturated fat is mostly found in: ? Foods that contain animal fat, such as red meat and some dairy products. ? Certain fatty foods made from plants, such as tropical oils.  Are overweight.  Are not getting enough exercise.  Have a family history of high cholesterol. What actions can I take to prevent this? Nutrition   Eat less saturated fat.  Avoid trans fats (partially hydrogenated oils). These are often found in margarine and in some baked goods, fried foods, and snacks bought in packages.  Avoid precooked or cured meat, such  as sausages or meat loaves.  Avoid foods and drinks that have added sugars.  Eat more fruits, vegetables, and whole grains.  Choose healthy sources of protein, such as fish, poultry, lean cuts of red meat, beans, peas, lentils, and nuts.  Choose healthy sources of fat, such as: ? Nuts. ? Vegetable oils, especially olive oil. ? Fish that have healthy fats (omega-3 fatty acids), such as mackerel or salmon. The items listed above may not be a complete list of recommended foods and beverages. Contact a dietitian for more information. Lifestyle  Lose weight if you are overweight. Losing 5-10 lb (2.3-4.5 kg) can help prevent or control high cholesterol. It can also lower your risk for diabetes and high blood pressure. Ask your health care provider to help you with a diet and exercise plan to lose weight safely.  Do not use any products that contain nicotine or tobacco, such as cigarettes, e-cigarettes, and chewing tobacco. If you need help quitting, ask your health care provider.  Limit your alcohol intake. ? Do not drink alcohol if:  Your health care provider tells you not to drink.  You are pregnant, may be pregnant, or are planning to become pregnant. ? If you drink alcohol:  Limit how much you use to:  0-1 drink a day for women.  0-2 drinks a day for men.  Be aware of how much alcohol is in your drink. In the [place] one drink equals one 12 oz bottle of beer (355 mL), one 5 oz glass of wine (148 mL), or one 1  oz glass of hard liquor (44 mL). Activity   Get enough exercise. Each week, do at least 150 minutes of exercise that takes a medium level of effort (moderate-intensity exercise). ? This is exercise that:  Makes your heart beat faster and makes you breathe harder than usual.  Allows you to still be able to talk. ? You could exercise in short sessions several times a day or longer sessions a few times a week. For example, on 5 days each week, you could walk fast or ride  your bike 3 times a day for 10 minutes each time.  Do exercises as told by your health care provider. Medicines  In addition to diet and lifestyle changes, your health care provider may recommend medicines to help lower cholesterol. This may be a medicine to lower the amount of cholesterol your liver makes. You may need medicine if: ? Diet and lifestyle changes do not lower your cholesterol enough. ? You have high cholesterol and other risk factors for heart disease or stroke.  Take over-the-counter and prescription medicines only as told by your health care provider. General information  Manage your risk factors for high cholesterol. Talk with your health care provider about all your risk factors and how to lower your risk.  Manage other conditions that you have, such as diabetes or high blood pressure (hypertension).  Have blood tests to check your cholesterol levels at regular points in time as told by your health care provider.  Keep all follow-up visits as told by your health care provider. This is important. Where to find more information  American Heart Association: www.heart.org  National Heart, Lung, and Blood Institute: https://wilson-eaton.com/ Summary  High cholesterol increases your risk for heart disease and stroke. By keeping your cholesterol level low, you can reduce your risk for these conditions.  High cholesterol can often be prevented with diet and lifestyle changes.  Work with your health care provider to manage your risk factors, and have your blood tested regularly. This information is not intended to replace advice given to you by your health care provider. Make sure you discuss any questions you have with your health care provider. Document Released: 04/16/2015 Document Revised: 07/24/2018 Document Reviewed: 12/09/2015 Elsevier Patient Education  2020 [employer].

## 2019-02-17 ENCOUNTER — Institutional Professional Consult (permissible substitution): Payer: Managed Care, Other (non HMO) | Admitting: Pulmonary Disease

## 2019-02-18 ENCOUNTER — Other Ambulatory Visit: Payer: Self-pay

## 2019-02-18 DIAGNOSIS — Z20822 Contact with and (suspected) exposure to covid-19: Secondary | ICD-10-CM

## 2019-02-18 NOTE — Telephone Encounter (Signed)
Pt was asked if she would like to make a virtual visit appt and she did refuse at this time

## 2019-02-18 NOTE — Telephone Encounter (Signed)
Would like to speak with Wells Guiles, LPN with Dr. Raoul Pitch regarding flu shot reaction.  Patient was seen on  02/16/19  Please call [phone]  Thank you

## 2019-02-18 NOTE — Telephone Encounter (Signed)
Pt was called and states she is going to get COVID test to be safe. She denies fever today and does not have SOB. 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

## 2019-02-20 LAB — NOVEL CORONAVIRUS, NAA: SARS-CoV-2, NAA: NOT DETECTED

## 2019-03-02 ENCOUNTER — Ambulatory Visit: Payer: Managed Care, Other (non HMO) | Admitting: Internal Medicine

## 2019-03-05 ENCOUNTER — Encounter: Payer: Self-pay | Admitting: Pulmonary Disease

## 2019-03-05 ENCOUNTER — Other Ambulatory Visit: Payer: Self-pay

## 2019-03-05 ENCOUNTER — Ambulatory Visit (INDEPENDENT_AMBULATORY_CARE_PROVIDER_SITE_OTHER): Payer: Managed Care, Other (non HMO) | Admitting: Pulmonary Disease

## 2019-03-05 VITALS — BP 132/70 | HR 78 | Temp 97.2°F | Ht 64.96 in | Wt 184.6 lb

## 2019-03-05 DIAGNOSIS — R0683 Snoring: Secondary | ICD-10-CM

## 2019-03-05 NOTE — Progress Notes (Signed)
Review of Systems  HENT: Positive for congestion.   Eyes: Positive for blurred vision.  Respiratory: Positive for shortness of breath.   Cardiovascular: Positive for palpitations.  Gastrointestinal: Positive for constipation and heartburn.  Musculoskeletal: Positive for joint pain.  Neurological: Positive for headaches.  Endo/Heme/Allergies: Positive for environmental allergies.  Psychiatric/Behavioral: The patient has insomnia.

## 2019-03-05 NOTE — Progress Notes (Signed)
[facility]  Chief Complaint  Patient presents with  . Consult    sleep consult     Constitutional:  BP 132/70 (BP Location: Right Arm, Cuff Size: Normal)   Pulse 78   Temp (!) 97.2 F (36.2 C) (Temporal)   Ht 5' 4.96" (1.65 m)   Wt 184 lb 9.6 oz (83.7 kg)   SpO2 98%   BMI 30.76 kg/m   Past Medical History:  Stroke, IBS, GERD, Diverticulitis, Colon polyps, Chicken pox, Asthma, OA, Allergies  Brief Summary:  Kayla Rosales is a 63 y.o. female with snoring.  She has noticed trouble with snoring for years.  This has been getting worse.  Her sleep has been getting more disruptive also.  She can fall asleep, but then has trouble staying asleep.  She is a restless sleeper.  She will wake up hearing herself snore, and her husband has told her that she stops breathing while asleep.  She has trouble sleeping on her back.  Her sleep improved some after she lost weight, but then got worse when she regained the weight.  She will wake up sometimes feeling anxious and her heart will be racing.  She gets cramps at night, and will gets sweats and chills at night also.  She goes to sleep at 10 pm.  She falls asleep quickly.  She wakes up 1 time to use the bathroom.  She gets out of bed at 6 am.  She feels tired in the morning sometimes.  She denies morning headache.  She does not use anything to help her fall sleep or stay awake.  She wears a mouth guard for teeth grinding.  She can fall asleep when she is sitting quietly.  Has trouble staying awake during movies.  She denies sleep walking, sleep talking, bruxism, or nightmares.  There is no history of restless legs.  She denies sleep hallucinations, sleep paralysis, or cataplexy.  The Epworth score is 16 out of 24.   Physical Exam:   Appearance - well kempt   ENMT - clear nasal mucosa, midline nasal  septum, no oral exudates, no LAN, trachea midline, MP 4  Respiratory - normal chest wall, normal  respiratory effort, no accessory muscle use, no wheeze/rales  CV - s1s2 regular rate and rhythm, no murmurs, no peripheral edema, radial pulses symmetric  GI - soft, non tender, no masses  Lymph - no adenopathy noted in neck and axillary areas  MSK - normal gait  Ext - no cyanosis, clubbing, or joint inflammation noted  Skin - no rashes, lesions, or ulcers  Neuro - normal strength, oriented x 3  Psych - normal mood and affect  Discussion:  She has snoring, sleep disruption, apnea, and daytime sleepiness.  She has history of CVA.  I am concerned she could have obstructive sleep apnea.  Assessment/Plan:   Snoring with excessive daytime sleepiness. - will need to arrange for a home sleep study  Obesity. - discussed how weight can impact sleep and risk for sleep disordered breathing - discussed options to assist with weight loss: combination of diet modification, cardiovascular and strength training exercises  Cardiovascular risk. - had an extensive discussion regarding the adverse health consequences related to untreated sleep disordered breathing - specifically discussed the risks for hypertension, coronary artery disease, cardiac dysrhythmias, cerebrovascular disease, and diabetes - lifestyle modification discussed  Safe driving practices. - discussed how sleep disruption can increase risk of accidents, particularly when driving - safe driving practices  were discussed  Therapies for obstructive sleep apnea. - if the sleep study shows significant sleep apnea, then various therapies for treatment were reviewed: CPAP, oral appliance, and surgical interventions   Patient Instructions  Will arrange for home sleep study Will call to arrange for follow up after sleep study reviewed         Chesley Mires, MD [place] Pager: [phone] 03/05/2019, 12:44 PM  Flow Sheet    Sleep tests:    Review of Systems:  HENT: Positive for congestion.    Eyes: Positive for blurred vision.  Respiratory: Positive for shortness of breath.   Cardiovascular: Positive for palpitations.  Gastrointestinal: Positive for constipation and heartburn.  Musculoskeletal: Positive for joint pain.  Neurological: Positive for headaches.  Endo/Heme/Allergies: Positive for environmental allergies.  Psychiatric/Behavioral: The patient has insomnia.    Medications:   Allergies as of 03/05/2019      Reactions   Hydromorphone Other (See Comments)   HYPOTENSION   Meperidine Other (See Comments)   "BP drops"   Sulfa Antibiotics Other (See Comments)   States she does not know the reaction      Medication List       Accurate as of March 05, 2019 12:44 PM. If you have any questions, ask your nurse or doctor.        STOP taking these medications   Fish Oil 1000 MG Caps Stopped by: Chesley Mires, MD     TAKE these medications   albuterol 108 (90 Base) MCG/ACT inhaler Commonly known as: VENTOLIN HFA Inhale 1-2 puffs into the lungs every 6 (six) hours as needed for wheezing or shortness of breath.   ALIVE WOMENS 50+ PO Take by mouth daily.   aspirin EC 81 MG tablet Take 81 mg by mouth daily.   atorvastatin 40 MG tablet Commonly known as: LIPITOR Take 1 tablet (40 mg total) by mouth at bedtime.   cyanocobalamin 1000 MCG tablet Take 1,000 mcg by mouth daily.   D3-1000 25 MCG (1000 UT) capsule Generic drug: Cholecalciferol Take 1,000 Units by mouth daily.   esomeprazole 40 MG capsule Commonly known as: NEXIUM Take 1 capsule (40 mg total) by mouth daily at 12 noon.   famotidine 40 MG tablet Commonly known as: Pepcid Take 1 tablet (40 mg total) by mouth daily.   fluticasone 50 MCG/ACT nasal spray Commonly known as: FLONASE Place 2 sprays into both nostrils daily.   loratadine 10 MG tablet Commonly known as: CLARITIN Take 10 mg by mouth daily.   magnesium 30 MG tablet Take 30 mg by mouth daily.   Probiotic Advanced Caps  Probiotic       Past Surgical History:  She  has a past surgical history that includes Neck surgery (2018); Colonoscopy (2015); Upper gastrointestinal endoscopy; Knee arthroscopy (Bilateral, 2013); and Colonoscopy w/ polypectomy (05/26/2018).  Family History:  Her family history includes Arthritis in her father; Breast cancer in her cousin; COPD in her sister; Depression in her mother; Depression (age of onset: 90) in her sister; Diabetes in her maternal grandmother; Drug abuse in her sister; Early death in her maternal grandfather, maternal grandmother, and sister; Early death (age of onset: 24) in her mother; Heart disease in her father, maternal grandfather, and mother; Hypertension in her mother; Lung cancer in her sister; Mental illness in her sister.  Social History:  She  reports that she has never smoked. She has never used smokeless tobacco. She reports current alcohol use of about 1.0 standard drinks of  alcohol per week. She reports that she does not use drugs.

## 2019-03-05 NOTE — Patient Instructions (Signed)
Will arrange for home sleep study Will call to arrange for follow up after sleep study reviewed  

## 2019-03-16 ENCOUNTER — Encounter: Payer: Managed Care, Other (non HMO) | Admitting: Obstetrics & Gynecology

## 2019-03-17 ENCOUNTER — Other Ambulatory Visit: Payer: Self-pay

## 2019-03-18 ENCOUNTER — Encounter: Payer: Self-pay | Admitting: Obstetrics & Gynecology

## 2019-03-18 ENCOUNTER — Encounter: Payer: Managed Care, Other (non HMO) | Admitting: Obstetrics & Gynecology

## 2019-03-18 ENCOUNTER — Ambulatory Visit (INDEPENDENT_AMBULATORY_CARE_PROVIDER_SITE_OTHER): Payer: Managed Care, Other (non HMO) | Admitting: Obstetrics & Gynecology

## 2019-03-18 VITALS — BP 130/80 | Ht 64.0 in | Wt 181.0 lb

## 2019-03-18 DIAGNOSIS — E6609 Other obesity due to excess calories: Secondary | ICD-10-CM | POA: Diagnosis not present

## 2019-03-18 DIAGNOSIS — Z780 Asymptomatic menopausal state: Secondary | ICD-10-CM | POA: Diagnosis not present

## 2019-03-18 DIAGNOSIS — N393 Stress incontinence (female) (male): Secondary | ICD-10-CM | POA: Diagnosis not present

## 2019-03-18 DIAGNOSIS — Z01419 Encounter for gynecological examination (general) (routine) without abnormal findings: Secondary | ICD-10-CM | POA: Diagnosis not present

## 2019-03-18 DIAGNOSIS — Z6831 Body mass index (BMI) 31.0-31.9, adult: Secondary | ICD-10-CM

## 2019-03-18 NOTE — Progress Notes (Signed)
Kayla Rosales 1955-06-07 [id number]   History:    63 y.o. G4P3A1L3 Remarried x 5 years.  Husband has 5 sons.  Many grand-children both sides.  RP:  Established patient presenting for annual gyn exam   HPI: Early menopause in the 75s, on hormone replacement therapy since 2 years ago.  Had a stroke at that time and stopped hormone replacement therapy.  Good recovery from stroke.  No postmenopausal bleeding.  No pelvic pain.  No pain with intercourse.  Urine normal except for SUI.  Difficulty passing stools, seen by Gertie Fey.  Breasts normal.  Body mass index 31.07.  Will start back on more regular physical activity.  Health labs with family physician.  Past medical history,surgical history, family history and social history were all reviewed and documented in the EPIC chart.  Gynecologic History No LMP recorded. Patient is postmenopausal.  Obstetric History OB History  Gravida Para Term Preterm AB Living  4 3   1 1 3   SAB TAB Ectopic Multiple Live Births      0        # Outcome Date GA Lbr Len/2nd Weight Sex Delivery Anes PTL Lv  4 AB           3 Para           2 Para           1 Preterm              ROS: A ROS was performed and pertinent positives and negatives are included in the history.  GENERAL: No fevers or chills. HEENT: No change in vision, no earache, sore throat or sinus congestion. NECK: No pain or stiffness. CARDIOVASCULAR: No chest pain or pressure. No palpitations. PULMONARY: No shortness of breath, cough or wheeze. GASTROINTESTINAL: No abdominal pain, nausea, vomiting or diarrhea, melena or bright red blood per rectum. GENITOURINARY: No urinary frequency, urgency, hesitancy or dysuria. MUSCULOSKELETAL: No joint or muscle pain, no back pain, no recent trauma. DERMATOLOGIC: No rash, no itching, no lesions. ENDOCRINE: No polyuria, polydipsia, no heat or cold intolerance. No recent change in weight. HEMATOLOGICAL: No anemia or easy bruising or bleeding. NEUROLOGIC: No  headache, seizures, numbness, tingling or weakness. PSYCHIATRIC: No depression, no loss of interest in normal activity or change in sleep pattern.     Exam:   BP 130/80   Ht 5\' 4"  (1.626 m)   Wt 181 lb (82.1 kg)   BMI 31.07 kg/m   Body mass index is 31.07 kg/m.  General appearance : Well developed well nourished female. No acute distress HEENT: Eyes: no retinal hemorrhage or exudates,  Neck supple, trachea midline, no carotid bruits, no thyroidmegaly Lungs: Clear to auscultation, no rhonchi or wheezes, or rib retractions  Heart: Regular rate and rhythm, no murmurs or gallops Breast:Examined in sitting and supine position were symmetrical in appearance, no palpable masses or tenderness,  no skin retraction, no nipple inversion, no nipple discharge, no skin discoloration, no axillary or supraclavicular lymphadenopathy Abdomen: no palpable masses or tenderness, no rebound or guarding Extremities: no edema or skin discoloration or tenderness  Pelvic: Vulva: Normal             Vagina: No gross lesions or discharge  Cervix: No gross lesions or discharge.  Pap reflex done.  Uterus  AV, normal size, shape and consistency, non-tender and mobile  Adnexa  Without masses or tenderness  Anus: Normal   Assessment/Plan:  64 y.o. female for annual exam   1.  Encounter for routine gynecological examination with Papanicolaou smear of cervix Normal gynecologic exam in menopause.  Pap reflex done.  Breast exam normal.  Screening mammogram December 2019 was negative.  Health labs with family physician.  2. Postmenopausal Well on no hormone replacement therapy.  No postmenopausal bleeding.  3. SUI (stress urinary incontinence, female) Precautions reviewed to avoid pelvic pressure, overfilling of the bladder and bladder irritants such as caffeine products.  Recommend Kegel exercises.  Will refer to physical therapy as needed.  4. Class 1 obesity due to excess calories with serious comorbidity and  body mass index (BMI) of 31.0 to 31.9 in adult Recommend a lower calorie/carb diet such as [employer].  Aerobic physical activities 5 times a week and weightlifting every 2 days.  Princess Bruins MD, 12:47 PM 03/18/2019

## 2019-03-22 LAB — PAP IG W/ RFLX HPV ASCU

## 2019-03-23 ENCOUNTER — Encounter: Payer: Self-pay | Admitting: Obstetrics & Gynecology

## 2019-03-23 NOTE — Patient Instructions (Signed)
1. Encounter for routine gynecological examination with Papanicolaou smear of cervix Normal gynecologic exam in menopause.  Pap reflex done.  Breast exam normal.  Screening mammogram December 2019 was negative.  Health labs with family physician.  2. Postmenopausal Well on no hormone replacement therapy.  No postmenopausal bleeding.  3. SUI (stress urinary incontinence, female) Precautions reviewed to avoid pelvic pressure, overfilling of the bladder and bladder irritants such as caffeine products.  Recommend Kegel exercises.  Will refer to physical therapy as needed.  4. Class 1 obesity due to excess calories with serious comorbidity and body mass index (BMI) of 31.0 to 31.9 in adult Recommend a lower calorie/carb diet such as [employer].  Aerobic physical activities 5 times a week and weightlifting every 2 days.  Kayla Rosales, it was a pleasure seeing you today!  I will inform you of your results as soon as they are available.

## 2019-03-25 ENCOUNTER — Encounter: Payer: Self-pay | Admitting: Family Medicine

## 2019-03-26 ENCOUNTER — Telehealth: Payer: Self-pay | Admitting: Pulmonary Disease

## 2019-03-26 NOTE — Telephone Encounter (Signed)
I am glad to hear you are enthusiastic about being vaccinated for the coronavirus/Covid. Currently we are not certain when the vaccine will be available to the general public. I will note your request- however we currently are not making a formal list yet, since there has been no timeline given on when vaccines will be distributed for general public.

## 2019-03-29 NOTE — Telephone Encounter (Signed)
Pt called back.  Made her aware there are several pt's ahead of her and we will get to her as soon as we can.  Nothing further needed.

## 2019-03-29 NOTE — Telephone Encounter (Signed)
I verified we have pt's order.  Tried to call her to let her know we have the order but we have several pt's ahead of her and will call her as soon as we get to her.  No answer and vm is full.

## 2019-04-13 ENCOUNTER — Ambulatory Visit: Payer: Managed Care, Other (non HMO) | Attending: Internal Medicine

## 2019-04-13 ENCOUNTER — Encounter: Payer: Self-pay | Admitting: Family Medicine

## 2019-04-13 DIAGNOSIS — Z20822 Contact with and (suspected) exposure to covid-19: Secondary | ICD-10-CM

## 2019-04-14 LAB — NOVEL CORONAVIRUS, NAA: SARS-CoV-2, NAA: NOT DETECTED

## 2019-04-20 ENCOUNTER — Other Ambulatory Visit: Payer: Self-pay | Admitting: Obstetrics & Gynecology

## 2019-04-20 DIAGNOSIS — Z1231 Encounter for screening mammogram for malignant neoplasm of breast: Secondary | ICD-10-CM

## 2019-04-21 ENCOUNTER — Other Ambulatory Visit: Payer: Self-pay

## 2019-04-21 ENCOUNTER — Ambulatory Visit
Admission: RE | Admit: 2019-04-21 | Discharge: 2019-04-21 | Disposition: A | Discharge status: Home / Self Care | Payer: Managed Care, Other (non HMO) | Source: Ambulatory Visit | Attending: Obstetrics & Gynecology | Admitting: Obstetrics & Gynecology

## 2019-04-21 DIAGNOSIS — Z1231 Encounter for screening mammogram for malignant neoplasm of breast: Secondary | ICD-10-CM

## 2019-04-22 ENCOUNTER — Ambulatory Visit (INDEPENDENT_AMBULATORY_CARE_PROVIDER_SITE_OTHER): Payer: Managed Care, Other (non HMO) | Admitting: Family Medicine

## 2019-04-22 DIAGNOSIS — Z23 Encounter for immunization: Secondary | ICD-10-CM | POA: Diagnosis not present

## 2019-04-28 ENCOUNTER — Telehealth: Payer: Self-pay

## 2019-04-28 DIAGNOSIS — M25562 Pain in left knee: Secondary | ICD-10-CM | POA: Insufficient documentation

## 2019-04-28 NOTE — Telephone Encounter (Signed)
Patient called in checking on status of home sleep study. I called [facility] & spoke with Patrice. She advised that patient is in next group to be contacted within the next 2 weeks. Advised patient that COVID has had an effect on how quickly appointments for home sleep studies can be performed.  Patient was very understanding.

## 2019-05-03 ENCOUNTER — Other Ambulatory Visit: Payer: Self-pay

## 2019-05-03 ENCOUNTER — Ambulatory Visit: Payer: Managed Care, Other (non HMO)

## 2019-05-03 DIAGNOSIS — R0683 Snoring: Secondary | ICD-10-CM

## 2019-05-03 DIAGNOSIS — G4733 Obstructive sleep apnea (adult) (pediatric): Secondary | ICD-10-CM | POA: Diagnosis not present

## 2019-05-04 ENCOUNTER — Telehealth: Payer: Self-pay | Admitting: Pulmonary Disease

## 2019-05-04 DIAGNOSIS — G4733 Obstructive sleep apnea (adult) (pediatric): Secondary | ICD-10-CM | POA: Diagnosis not present

## 2019-05-04 NOTE — Telephone Encounter (Signed)
HST 05/03/19 >> AHI 6.5, SpO2 low 82%   Please inform her that her sleep study shows mild obstructive sleep apnea.  Please arrange for ROV with me or NP to discuss treatment options.

## 2019-05-04 NOTE — Telephone Encounter (Signed)
Called the Kayla Rosales and made her aware of the results. Kayla Rosales scheduled for video visit 05/06/19 at 9:30 with Derl Barrow, NP. Nothing further needed at this time.

## 2019-05-06 ENCOUNTER — Telehealth (INDEPENDENT_AMBULATORY_CARE_PROVIDER_SITE_OTHER): Payer: Managed Care, Other (non HMO) | Admitting: Primary Care

## 2019-05-06 ENCOUNTER — Telehealth: Payer: Self-pay | Admitting: Primary Care

## 2019-05-06 DIAGNOSIS — Z7689 Persons encountering health services in other specified circumstances: Secondary | ICD-10-CM

## 2019-05-06 DIAGNOSIS — G4730 Sleep apnea, unspecified: Secondary | ICD-10-CM

## 2019-05-06 DIAGNOSIS — E669 Obesity, unspecified: Secondary | ICD-10-CM

## 2019-05-06 NOTE — Telephone Encounter (Signed)
Beth,  The referral placed today for Nutrition was under Z76.89 as encounter for weight management. Is there another diagnosis that we may be able to use that is not under the "Z" code?

## 2019-05-06 NOTE — Progress Notes (Signed)
Virtual Visit via Video Note  I connected with Journii Vereen on 05/06/19 at  9:30 AM EST by a video enabled telemedicine application and verified that I am speaking with the correct person using two identifiers.  Location: Patient: Home Provider: Office   I discussed the limitations of evaluation and management by telemedicine and the availability of in person appointments. The patient expressed understanding and agreed to proceed.  History of Present Illness: 64 year old female, never smoked.  Past medical history significant for asthma, CVA, daytime sleepiness and snoring.  Patient of Dr. Halford Chessman, seen for initial sleep consult on 03/05/2019.  Home sleep test in January 2021 showed mild obstructive sleep apnea; AHI 6.5 with SpO2 low 82%.    05/06/2019  Patient contacted today by video to review home sleep test results.  He has mild obstructive sleep apnea with reports of daytime fatigue. She reports disruptive sleep pattern.  Her symptoms have improved in the past with weight loss.  She would like to hold off on CPAP therapy. She reports history of nasal deviated septum and would like to see ear nose and throat.  He also reports gluten sensitivity and up would appreciate help in weight loss.  Observations/Objective:  -Appears well, no observed respiratory symptoms  Assessment and Plan:  Mild obstructive sleep apnea: -HST 05/03/19 AHI 6.5/hr -Symptoms of snoring and sleep disruption -Would like to hold off on CPAP therapy at this time -Recommend weight loss and side sleeping position.  Advised not to drive experiencing excessive daytime fatigue or somnolence -Refer to ENT due to reported history of deviated septum -Refer to nutrition for weight management/gluten sensitivity  Follow Up Instructions:  -Follow-up in 6 months with Dr. Halford Chessman   I discussed the assessment and treatment plan with the patient. The patient was provided an opportunity to ask questions and all were answered. The  patient agreed with the plan and demonstrated an understanding of the instructions.   The patient was advised to call back or seek an in-person evaluation if the symptoms worsen or if the condition fails to improve as anticipated.  I provided 25 minutes of non-face-to-face time during this encounter.   Martyn Ehrich, NP

## 2019-05-06 NOTE — Patient Instructions (Addendum)
Refer: Nutrition re: weight management/gluten sensitivity ENT re: sleep apnea  Follow-up: 6 months with Dr. Halford Chessman    Living With Sleep Apnea Sleep apnea is a condition in which breathing pauses or becomes shallow during sleep. Sleep apnea is most commonly caused by a collapsed or blocked airway. People with sleep apnea snore loudly and have times when they gasp and stop breathing for 10 seconds or more during sleep. This happens over and over during the night. This disrupts your sleep and keeps your body from getting the rest that it needs, which can cause tiredness and lack of energy (fatigue) during the day. The breaks in breathing also interrupt the deep sleep that you need to feel rested. Even if you do not completely wake up from the gaps in breathing, your sleep may not be restful. You may also have a headache in the morning and low energy during the day, and you may feel anxious or depressed. How can sleep apnea affect me? Sleep apnea increases your chances of extreme tiredness during the day (daytime fatigue). It can also increase your risk for health conditions, such as:  Heart attack.  Stroke.  Diabetes.  Heart failure.  Irregular heartbeat.  High blood pressure. If you have daytime fatigue as a result of sleep apnea, you may be more likely to:  Perform poorly at school or work.  Fall asleep while driving.  Have difficulty with attention.  Develop depression or anxiety.  Become severely overweight (obese).  Have sexual dysfunction. What actions can I take to manage sleep apnea? Sleep apnea treatment   If you were given a device to open your airway while you sleep, use it only as told by your health care provider. You may be given: ? An oral appliance. This is a custom-made mouthpiece that shifts your lower jaw forward. ? A continuous positive airway pressure (CPAP) device. This device blows air through a mask when you breathe out (exhale). ? A nasal expiratory  positive airway pressure (EPAP) device. This device has valves that you put into each nostril. ? A bi-level positive airway pressure (BPAP) device. This device blows air through a mask when you breathe in (inhale) and breathe out (exhale).  You may need surgery if other treatments do not work for you. Sleep habits  Go to sleep and wake up at the same time every day. This helps set your internal clock (circadian rhythm) for sleeping. ? If you stay up later than usual, such as on weekends, try to get up in the morning within 2 hours of your normal wake time.  Try to get at least 7-9 hours of sleep each night.  Stop computer, tablet, and mobile phone use a few hours before bedtime.  Do not take long naps during the day. If you nap, limit it to 30 minutes.  Have a relaxing bedtime routine. Reading or listening to music may relax you and help you sleep.  Use your bedroom only for sleep. ? Keep your television and computer out of your bedroom. ? Keep your bedroom cool, dark, and quiet. ? Use a supportive mattress and pillows.  Follow your health care provider's instructions for other changes to sleep habits. Nutrition  Do not eat heavy meals in the evening.  Do not have caffeine in the later part of the day. The effects of caffeine can last for more than 5 hours.  Follow your health care provider's or dietitian's instructions for any diet changes. Lifestyle  Do not drink alcohol before bedtime. Alcohol can cause you to fall asleep at first, but then it can cause you to wake up in the middle of the night and have trouble getting back to sleep.  Do not use any products that contain nicotine or tobacco, such as cigarettes and e-cigarettes. If you need help quitting, ask your health care provider. Medicines  Take over-the-counter and prescription medicines only as told by your health care provider.  Do not use over-the-counter sleep medicine. You can become dependent on this  medicine, and it can make sleep apnea worse.  Do not use medicines, such as sedatives and narcotics, unless told by your health care provider. Activity  Exercise on most days, but avoid exercising in the evening. Exercising near bedtime can interfere with sleeping.  If possible, spend time outside every day. Natural light helps regulate your circadian rhythm. General information  Lose weight if you need to, and maintain a healthy weight.  Keep all follow-up visits as told by your health care provider. This is important.  If you are having surgery, make sure to tell your health care provider that you have sleep apnea. You may need to bring your device with you. Where to find more information Learn more about sleep apnea and daytime fatigue from:  American Sleep Association: sleepassociation.[place]: sleepfoundation.org  National Heart, Lung, and Blood Institute: https://www.[place]man-hill.biz/ Summary  Sleep apnea can cause daytime fatigue and other serious health conditions.  Both sleep apnea and daytime fatigue can be bad for your health and well-being.  You may need to wear a device while sleeping to help keep your airway open.  If you are having surgery, make sure to tell your health care provider that you have sleep apnea. You may need to bring your device with you.  Making changes to sleep habits, diet, lifestyle, and activity can help you manage sleep apnea. This information is not intended to replace advice given to you by your health care provider. Make sure you discuss any questions you have with your health care provider. Document Revised: 07/24/2018 Document Reviewed: 06/26/2017 Elsevier Patient Education  [place].

## 2019-05-07 NOTE — Telephone Encounter (Signed)
The code with her BMI was attached to the referral. Nothing further is needed.

## 2019-05-07 NOTE — Telephone Encounter (Signed)
She has gluten sensitivity or use her BMI?

## 2019-05-20 ENCOUNTER — Encounter: Payer: Managed Care, Other (non HMO) | Attending: Primary Care | Admitting: Dietician

## 2019-05-20 ENCOUNTER — Encounter: Payer: Self-pay | Admitting: Dietician

## 2019-05-20 ENCOUNTER — Other Ambulatory Visit: Payer: Self-pay

## 2019-05-20 DIAGNOSIS — E669 Obesity, unspecified: Secondary | ICD-10-CM | POA: Diagnosis present

## 2019-05-20 NOTE — Progress Notes (Signed)
Medical Nutrition Therapy  Appt Start Time: 8:10am   End Time: 8:55am  Primary concerns today: weight management   Referral diagnosis: E66.9- obesity  Preferred learning style: no preference indicated Learning readiness: ready   NUTRITION ASSESSMENT   Anthropometrics  Weight: 183.9 lbs  BMI: 31.1 kg/m2  Lifestyle & Dietary Hx Typical meal pattern is 2-3 meals per day plus 2-3 snacks. Patient states she is on the go a lot, which can make it difficult to have consistent meals. Tries to avoid sweets. May snack on fruit or crackers. Typically will have a granola bar or oatmeal for breakfast if anything. May make chicken, a [place] sandwich on gluten-free bread or meat with vegetables. Follows a gluten free diet (has for about ~10 years now.)   Supplements: MVI, vitamin D, probiotic  Sleep: poor sleep, wakes frequently during the night, potential sleep apnea  Stress / self-care: very stressed caring for her father  Current average weekly physical activity: none currently   24-Hr Dietary Recall First Meal: granola bar + 4 oz coffee w/ silk almond milk creamer & Premier protein shake  Snack: fries + milkshake Second Meal:  1/2 cauliflower cheese pizza w/ ham & kalamata olives  Snack: almond crackers + 4 oz coffee w/ silk almond milk creamer & Premier protein shake Third Meal: 2 bowls Cheerios + almond milk  Snack: - Beverages: water, carbonated water, occasionally soda, V8  Estimated Energy Needs Calories: 1500-1600 Carbohydrate: 170-180g Protein: 94-100g Fat: 50-53g   NUTRITION DIAGNOSIS  Predicted inadequate nutrient intake (NI-5.11.1) related to predicted suboptimal vegetable and protein intake compared to estimated needs as evidenced by patient reported dietary recall.    NUTRITION INTERVENTION  Nutrition education (E-1) on the following topics:  . Balanced healthful eating and consuming enough yet not too much food/beverages to get adequate calories and nutrition.    Handouts Provided Include   MyPlate Portions   Meal Ideas  Breakfast Ideas  Balanced Snacks   Learning Style & Readiness for Change Teaching method utilized: Visual & Auditory  Demonstrated degree of understanding via: Teach Back  Barriers to learning/adherence to lifestyle change: None Identified   Goals Established by Pt . Create more balanced meals by moderating carbohydrate/starch intake and increasing protein and vegetable intake.    MONITORING & EVALUATION Dietary intake, weekly physical activity, and goals prn.  Next Steps  Patient is to contact [facility] to schedule follow up visit.

## 2019-06-10 ENCOUNTER — Telehealth: Payer: Self-pay

## 2019-06-10 NOTE — Telephone Encounter (Signed)
Pt was called and told to speak with Pulmonology about these results and what the next steps would be. Pt agreed and was going to set up appt to discuss results and next steps.

## 2019-06-10 NOTE — Telephone Encounter (Signed)
Patient went in for sleep study. She cannot review sleep study test results on mychart.  She is requesting Dr. Lucita Lora opinion about the results.  Please call [phone].

## 2019-06-15 ENCOUNTER — Ambulatory Visit (INDEPENDENT_AMBULATORY_CARE_PROVIDER_SITE_OTHER): Payer: Managed Care, Other (non HMO) | Admitting: Family Medicine

## 2019-06-15 ENCOUNTER — Encounter: Payer: Self-pay | Admitting: Family Medicine

## 2019-06-15 ENCOUNTER — Other Ambulatory Visit: Payer: Self-pay

## 2019-06-15 VITALS — BP 129/78 | HR 65 | Temp 97.6°F | Resp 16 | Ht 65.0 in | Wt 186.0 lb

## 2019-06-15 DIAGNOSIS — F419 Anxiety disorder, unspecified: Secondary | ICD-10-CM | POA: Diagnosis not present

## 2019-06-15 DIAGNOSIS — G44209 Tension-type headache, unspecified, not intractable: Secondary | ICD-10-CM | POA: Diagnosis not present

## 2019-06-15 DIAGNOSIS — R002 Palpitations: Secondary | ICD-10-CM | POA: Diagnosis not present

## 2019-06-15 DIAGNOSIS — G479 Sleep disorder, unspecified: Secondary | ICD-10-CM | POA: Diagnosis not present

## 2019-06-15 DIAGNOSIS — E785 Hyperlipidemia, unspecified: Secondary | ICD-10-CM | POA: Diagnosis not present

## 2019-06-15 DIAGNOSIS — R011 Cardiac murmur, unspecified: Secondary | ICD-10-CM

## 2019-06-15 DIAGNOSIS — Z8673 Personal history of transient ischemic attack (TIA), and cerebral infarction without residual deficits: Secondary | ICD-10-CM | POA: Diagnosis not present

## 2019-06-15 DIAGNOSIS — R5383 Other fatigue: Secondary | ICD-10-CM

## 2019-06-15 LAB — T4, FREE: Free T4: 0.76 ng/dL (ref 0.60–1.60)

## 2019-06-15 LAB — CBC
HCT: 41.1 % (ref 36.0–46.0)
Hemoglobin: 14.4 g/dL (ref 12.0–15.0)
MCHC: 35.1 g/dL (ref 30.0–36.0)
MCV: 98.6 fl (ref 78.0–100.0)
Platelets: 218 10*3/uL (ref 150.0–400.0)
RBC: 4.17 Mil/uL (ref 3.87–5.11)
RDW: 12.7 % (ref 11.5–15.5)
WBC: 5.3 10*3/uL (ref 4.0–10.5)

## 2019-06-15 LAB — BASIC METABOLIC PANEL
BUN: 11 mg/dL (ref 6–23)
CO2: 28 mEq/L (ref 19–32)
Calcium: 9.4 mg/dL (ref 8.4–10.5)
Chloride: 104 mEq/L (ref 96–112)
Creatinine, Ser: 0.63 mg/dL (ref 0.40–1.20)
GFR: 95.01 mL/min (ref >60.00)
Glucose, Bld: 93 mg/dL (ref 70–99)
Potassium: 4 mEq/L (ref 3.5–5.1)
Sodium: 138 mEq/L (ref 135–145)

## 2019-06-15 LAB — TSH: TSH: 1.32 u[IU]/mL (ref 0.35–4.50)

## 2019-06-15 LAB — MAGNESIUM: Magnesium: 1.9 mg/dL (ref 1.5–2.5)

## 2019-06-15 LAB — T3, FREE: T3, Free: 4.1 pg/mL (ref 2.3–4.2)

## 2019-06-15 MED ORDER — HYDROXYZINE HCL 10 MG PO TABS: 10.0000 mg | ORAL_TABLET | Freq: Every day | ORAL | 0 refills | Status: DC

## 2019-06-15 NOTE — Patient Instructions (Signed)
We will call you with labs results.  Start the vistaril 1-5 tabs before bed (about 1 hour). This helps with anxiety, sleep, muscle relaxation, headaches and allergies.   I will refer you to cardio.     Palpitations Palpitations are feelings that your heartbeat is not normal. Your heartbeat may feel like it is:  Uneven.  Faster than normal.  Fluttering.  Skipping a beat. This is usually not a serious problem. In some cases, you may need tests to rule out any serious problems. Follow these instructions at home: Pay attention to any changes in your condition. Take these actions to help manage your symptoms: Eating and drinking  Avoid: ? Coffee, tea, soft drinks, and energy drinks. ? Chocolate. ? Alcohol. ? Diet pills. Lifestyle   Try to lower your stress. These things can help you relax: ? Yoga. ? Deep breathing and meditation. ? Exercise. ? Using words and images to create positive thoughts (guided imagery). ? Using your mind to control things in your body (biofeedback).  Do not use drugs.  Get plenty of rest and sleep. Keep a regular bed time. General instructions   Take over-the-counter and prescription medicines only as told by your doctor.  Do not use any products that contain nicotine or tobacco, such as cigarettes and e-cigarettes. If you need help quitting, ask your doctor.  Keep all follow-up visits as told by your doctor. This is important. You may need more tests if palpitations do not go away or get worse. Contact a doctor if:  Your symptoms last more than 24 hours.  Your symptoms occur more often. Get help right away if you:  Have chest pain.  Feel short of breath.  Have a very bad headache.  Feel dizzy.  Pass out (faint). Summary  Palpitations are feelings that your heartbeat is uneven or faster than normal. It may feel like your heart is fluttering or skipping a beat.  Avoid food and drinks that may cause palpitations. These include  caffeine, chocolate, and alcohol.  Try to lower your stress. Do not smoke or use drugs.  Get help right away if you faint or have chest pain, shortness of breath, a severe headache, or dizziness. This information is not intended to replace advice given to you by your health care provider. Make sure you discuss any questions you have with your health care provider. Document Revised: 05/14/2017 Document Reviewed: 05/14/2017 Elsevier Patient Education  [place].   Tension Headache, Adult A tension headache is pain, pressure, or aching in your head. Tension headaches can last from 30 minutes to several days. Follow these instructions at home: Managing pain  Take over-the-counter and prescription medicines only as told by your doctor.  When you have a headache, lie down in a dark, quiet room.  If told, put ice on your head and neck: ? Put ice in a plastic bag. ? Place a towel between your skin and the bag. ? Leave the ice on for 20 minutes, 2-3 times a day.  If told, put heat on the back of your neck. Do this as often as your doctor tells you to. Use the kind of heat that your doctor recommends, such as a moist heat pack or a heating pad. ? Place a towel between your skin and the heat. ? Leave the heat on for 20-30 minutes. ? Remove the heat if your skin turns bright red. Eating and drinking  Eat meals on a regular schedule.  Watch how much alcohol  you drink: ? If you are a woman and are not pregnant, do not drink more than 1 drink a day. ? If you are a man, do not drink more than 2 drinks a day.  Drink enough fluid to keep your pee (urine) pale yellow.  Do not use a lot of caffeine, or stop using caffeine. Lifestyle  Get enough sleep. Get 7-9 hours of sleep each night. Or get the amount of sleep that your doctor tells you to.  At bedtime, remove all electronic devices from your room. Examples of electronic devices are computers, phones, and tablets.  Find ways to  lessen your stress. Some things that can lessen stress are: ? Exercise. ? Deep breathing. ? Yoga. ? Music. ? Positive thoughts.  Sit up straight. Do not tighten (tense) your muscles.  Do not use any products that have nicotine or tobacco in them, such as cigarettes and e-cigarettes. If you need help quitting, ask your doctor. General instructions   Keep all follow-up visits as told by your doctor. This is important.  Avoid things that can bring on headaches. Keep a journal to find out if certain things bring on headaches. For example, write down: ? What you eat and drink. ? How much sleep you get. ? Any change to your diet or medicines. Contact a doctor if:  Your headache does not get better.  Your headache comes back.  You have a headache and sounds, light, or smells bother you.  You feel sick to your stomach (nauseous) or you throw up (vomit).  Your stomach hurts. Get help right away if:  You suddenly get a very bad headache along with any of these: ? A stiff neck. ? Feeling sick to your stomach. ? Throwing up. ? Feeling weak. ? Trouble seeing. ? Feeling short of breath. ? A rash. ? Feeling unusually sleepy. ? Trouble speaking. ? Pain in your eye or ear. ? Trouble walking or balancing. ? Feeling like you will pass out (faint). ? Passing out. Summary  A tension headache is pain, pressure, or aching in your head.  Tension headaches can last from 30 minutes to several days.  Lifestyle changes and medicines may help relieve pain. This information is not intended to replace advice given to you by your health care provider. Make sure you discuss any questions you have with your health care provider. Document Revised: 01/27/2019 Document Reviewed: 07/12/2016 Elsevier Patient Education  [place].

## 2019-06-15 NOTE — Progress Notes (Signed)
Kayla Rosales , Mar 08, 1956, 64 y.o., female MRN: [id number] Patient Care Team    Relationship Specialty Notifications Start End  Ma Hillock, DO PCP - General Family Medicine  01/15/18   Gatha Mayer, MD Consulting Physician Gastroenterology  01/19/18     Chief Complaint  Patient presents with   Migraine    Pt has had some tension headaches recently    Tachycardia    Pt feels like she is having a rapid heart rate in the middle of the night or like a "flutter"     Subjective: Kayla Rosales is a 64 y.o. female present today to discuss increase frequency and headaches.  Patient reports her headaches seem to be tension-like.  Located along her forehead and temples.  She reports she has not been sleeping well.  She is endorsing increased stress secondary to the surrounding care of her father which has dementia.  She also endorses intermittent sinus symptoms.  She has had episodes where she feels like her heart is racing.  This is occurring more frequently, a few times a week.  This usually occurs in the middle the night.  She does endorse drinking approximately 1 total cup of coffee a day.  She has a significant history of stroke in 2017.  She reports she used to have a cardiologist she followed with until she moved here.  She would like referral to cardiology today.  Depression screen [facility] 2/9 05/20/2019 01/15/2018  Decreased Interest 0 0  Down, Depressed, Hopeless 0 0  PHQ - 2 Score 0 0    Allergies  Allergen Reactions   Hydromorphone Other (See Comments)    HYPOTENSION   Meperidine Other (See Comments)    "BP drops"   Sulfa Antibiotics Other (See Comments)    States she does not know the reaction    Social History   Social History Narrative   Married second family - 8 kids 51 grandkids blended   Haematologist, retired Pharmacist, hospital   Caffeine 2/day   Smoker - never   EtOH - rare   No drugs   Wears her seatbelt, smoke alarm at home.   Feels safe in her relationships.    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    Past Surgical History:  Procedure Laterality Date   [facility] [place]   COLONOSCOPY W/ POLYPECTOMY  05/26/2018   x5 polyps   KNEE ARTHROSCOPY Bilateral 2013   meniscus  x2   NECK SURGERY  2018   disc fusion   UPPER GASTROINTESTINAL ENDOSCOPY     Family History  Problem Relation Age of Onset   Depression Mother    Heart disease Mother    Early death Mother 9   Hypertension Mother    Arthritis Father    Heart disease Father    Depression Sister 48   Early death Sister    Drug abuse Sister    Lung cancer Sister  Mental illness Sister    COPD Sister    Diabetes Maternal Grandmother    Early death Maternal Grandmother    Early death Maternal Grandfather    Heart disease Maternal Grandfather    Breast cancer Cousin    Allergies as of 06/15/2019      Reactions   Hydromorphone Other (See Comments)   HYPOTENSION   Meperidine Other (See Comments)   "BP drops"   Sulfa Antibiotics Other (See Comments)   States she does not know the reaction      Medication List       Accurate as of June 15, 2019 11:13 AM. If you have any questions, ask your nurse or doctor.        albuterol 108 (90 Base) MCG/ACT inhaler Commonly known as: VENTOLIN HFA Inhale 1-2 puffs into the lungs every 6 (six) hours as needed for wheezing or shortness of breath.   ALIVE WOMENS 50+ PO Take by mouth daily.   aspirin EC 81 MG tablet Take 81 mg by mouth daily.   atorvastatin  40 MG tablet Commonly known as: LIPITOR Take 1 tablet (40 mg total) by mouth at bedtime.   Collagen Hydrolysate (Bovine) Powd by Does not apply route.   cyanocobalamin 1000 MCG tablet Take 1,000 mcg by mouth daily.   D3-1000 25 MCG (1000 UT) capsule Generic drug: Cholecalciferol Take 1,000 Units by mouth daily.   esomeprazole 40 MG capsule Commonly known as: NEXIUM Take 1 capsule (40 mg total) by mouth daily at 12 noon.   famotidine 40 MG tablet Commonly known as: Pepcid Take 1 tablet (40 mg total) by mouth daily. What changed: additional instructions   fluticasone 50 MCG/ACT nasal spray Commonly known as: FLONASE Place 2 sprays into both nostrils daily.   magnesium 30 MG tablet Take 30 mg by mouth daily.   Probiotic Advanced Caps daily.       All past medical history, surgical history, allergies, family history, immunizations andmedications were updated in the EMR today and reviewed under the history and medication portions of their EMR.     ROS: Negative, with the exception of above mentioned in HPI   Objective:  BP 129/78 (BP Location: Left Arm, Patient Position: Sitting, Cuff Size: Normal)    Pulse 65    Temp 97.6 F (36.4 C) (Temporal)    Resp 16    Ht 5\' 5"  (1.651 m)    Wt 186 lb (84.4 kg)    SpO2 98%    BMI 30.95 kg/m  Body mass index is 30.95 kg/m. Gen: Afebrile. No acute distress.  HENT: AT. [place]. Bilateral TM visualized and normal in appearance. MMM. Bilateral nares without erythema or swelling. Throat without erythema or exudates.  No cough.  No hoarseness. Eyes:Pupils Equal Round Reactive to light, Extraocular movements intact,  Conjunctiva without redness, discharge or icterus. Neck/lymp/endocrine: Supple, no lymphadenopathy, no thyromegaly CV: RRR no murmur appreciated today, no edema Chest: CTAB, no wheeze or crackles Skin: no rashes, purpura or petechiae.  Neuro:  Normal gait. PERLA. EOMi. Alert. Oriented. X3. Psych: Normal affect, dress and  demeanor. Normal speech. Normal thought content and judgment.   No exam data present No results found. No results found for this or any previous visit (from the past 24 hour(s)).  Assessment/Plan: Kayla Rosales is a 64 y.o. female present for OV for  Heart palpitations/history of stroke/hyperlipidemia Rule out thyroid disorder, iron or anemia is causes her palpitations.  Patient was encouraged to attempt to decrease her caffeine  use. Continue statin. Continue ASA. - Basic Metabolic Panel (BMET) - CBC - Iron, TIBC and Ferritin Panel - Ambulatory referral to Cardiology  Sleep disturbance/anxiety/tension headache/fatigue Start Vistaril taper. Discontinue Flonase temporarily to ensure not causes of headache. Vistaril should also help with her intermittent sinus symptoms. - Magnesium - TSH - T4, free - T3, free Follow-up in 4 weeks if needed.  If headaches continue or worsen follow-up sooner and would need to consider MRI versus neurology referral given her history.   Reviewed expectations re: course of current medical issues.  Discussed self-management of symptoms.  Outlined signs and symptoms indicating need for more acute intervention.  Patient verbalized understanding and all questions were answered.  Patient received an After-Visit Summary.   No orders of the defined types were placed in this encounter.  Meds ordered this encounter  Medications   hydrOXYzine (ATARAX/VISTARIL) 10 MG tablet    Sig: Take 1-5 tablets (10-50 mg total) by mouth at bedtime.    Dispense:  90 tablet    Refill:  0    Referral Orders     Ambulatory referral to Cardiology  Note is dictated utilizing voice recognition software. Although note has been proof read prior to signing, occasional typographical errors still can be missed. If any questions arise, please do not hesitate to call for verification.   electronically signed by:  Howard Pouch, DO  [place]

## 2019-06-16 ENCOUNTER — Ambulatory Visit (INDEPENDENT_AMBULATORY_CARE_PROVIDER_SITE_OTHER): Payer: Managed Care, Other (non HMO) | Admitting: Family Medicine

## 2019-06-16 ENCOUNTER — Telehealth: Payer: Self-pay | Admitting: Pulmonary Disease

## 2019-06-16 ENCOUNTER — Telehealth: Payer: Self-pay | Admitting: Family Medicine

## 2019-06-16 ENCOUNTER — Encounter: Payer: Self-pay | Admitting: Family Medicine

## 2019-06-16 VITALS — Ht 65.0 in

## 2019-06-16 DIAGNOSIS — R300 Dysuria: Secondary | ICD-10-CM

## 2019-06-16 LAB — IRON,TIBC AND FERRITIN PANEL
Ferritin: 166 ng/mL — ABNORMAL HIGH (ref 15–150)
Iron Saturation: 28 % (ref 15–55)
Iron: 73 ug/dL (ref 27–139)
Total Iron Binding Capacity: 263 ug/dL (ref 250–450)
UIBC: 190 ug/dL (ref 118–369)

## 2019-06-16 LAB — POC URINALSYSI DIPSTICK (AUTOMATED)
Bilirubin, UA: NEGATIVE
Blood, UA: NEGATIVE
Glucose, UA: NEGATIVE
Ketones, UA: NEGATIVE
Leukocytes, UA: NEGATIVE
Nitrite, UA: NEGATIVE
Protein, UA: NEGATIVE
Spec Grav, UA: 1.01 (ref 1.010–1.025)
Urobilinogen, UA: 0.2 E.U./dL
pH, UA: 7 (ref 5.0–8.0)

## 2019-06-16 MED ORDER — CEPHALEXIN 500 MG PO CAPS: 500.0000 mg | ORAL_CAPSULE | Freq: Three times a day (TID) | ORAL | 0 refills | Status: DC

## 2019-06-16 NOTE — Progress Notes (Signed)
VIRTUAL VISIT VIA VIDEO  I connected with Kayla Rosales on 06/16/19 at  3:00 PM EST by elemedicine application and verified that I am speaking with the correct person using two identifiers. Location patient: Home Location provider: [facility], Office Persons participating in the virtual visit: Patient, Dr. Raoul Pitch and R.Baker, LPN  I discussed the limitations of evaluation and management by telemedicine and the availability of in person appointments. The patient expressed understanding and agreed to proceed.   SUBJECTIVE Chief Complaint  Patient presents with  . Urinary Frequency    x3 days. Urinary frequencing and burning. No bleed or fever.     HPI: Kayla Rosales is a 64 y.o. female presents today for complaints of urinary frequency with mild dysuria.  She reports this has been present for approximately 3 days and today it had worsened.  She reports she does drink a good deal of water.  However she usually only has to urinate a few times a day.  She does feel like she is emptying her bladder on each occasion and produces a fair amount of urine, however she continues to have frequency.  She denies any hematuria.  She denies any fever, chills, nausea or vomit.  She reports she has not had urinary tract infection in many years.  She is allergic to sulfa ROS: See pertinent positives and negatives per HPI.  Patient Active Problem List   Diagnosis Date Noted  . Heart murmur 02/16/2019  . Hyperlipidemia LDL goal <100 02/16/2019  . Snoring 01/04/2019  . Obesity (BMI 30-39.9) 01/04/2019  . Sweat, sweating, excessive 12/23/2018  . Influenza vaccination declined 12/17/2018  . Pelvic floor dysfunction in female 12/06/2018  . Constipation due to outlet dysfunction 12/06/2018  . Lactose intolerance 12/06/2018  . Diverticulosis 05/27/2018  . Osteopenia 01/19/2018  . Irritable bowel syndrome with constipation 01/17/2018  . 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 History of colonic  polyp 01/17/2018  . History of stroke 01/15/2018  . GERD (gastroesophageal reflux disease)   . Asthma   . S/P cervical spinal fusion 09/29/2017  . Macrocytosis without anemia 11/25/2015  . 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  . Heart palpitations 09/08/2015  . 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 Vestibular neuritis 10/15/2006  . Family history of malignant neoplasm of breast 03/18/2006  . Diaphragmatic hernia 03/18/2006  . Premature menopause 03/18/2006    Social History   Tobacco Use  . Smoking status: Never Smoker  . Smokeless tobacco: Never Used  Substance Use Topics  . Alcohol use: Yes    Alcohol/week: 1.0 standard drinks    Types: 1 Glasses of wine per week    Comment: 1/2-1 per day    Current Outpatient Medications:  .  albuterol (VENTOLIN HFA) 108 (90 Base) MCG/ACT inhaler, Inhale 1-2 puffs into the lungs every 6 (six) hours as needed for wheezing or shortness of breath., Disp: 6.7 g, Rfl: 5 .  aspirin EC 81 MG tablet, Take 81 mg by mouth daily., Disp: , Rfl:  .  atorvastatin (LIPITOR) 40 MG tablet, Take 1 tablet (40 mg total) by mouth at bedtime., Disp: 90 tablet, Rfl: 3 .  Cholecalciferol (D3-1000) 1000 units capsule, Take 1,000 Units by mouth daily., Disp: , Rfl:  .  Collagen Hydrolysate, Bovine, POWD, by Does not apply route., Disp: , Rfl:  .  cyanocobalamin 1000 MCG tablet, Take 1,000 mcg by mouth daily., Disp: , Rfl:  .  esomeprazole (NEXIUM) 40 MG capsule, Take 1 capsule (40 mg total) by mouth  daily at 12 noon., Disp: 90 capsule, Rfl: 1 .  famotidine (PEPCID) 40 MG tablet, Take 1 tablet (40 mg total) by mouth daily. (Patient taking differently: Take 40 mg by mouth daily. As needed), Disp: 90 tablet, Rfl: 3 .  fluticasone (FLONASE) 50 MCG/ACT nasal spray, Place 2 sprays into both nostrils daily., Disp: 16 g, Rfl: 6 .  hydrOXYzine (ATARAX/VISTARIL) 10 MG tablet, Take 1-5 tablets (10-50 mg total) by mouth at bedtime., Disp: 90 tablet, Rfl: 0 .  magnesium  30 MG tablet, Take 30 mg by mouth daily., Disp: , Rfl:  .  Multiple Vitamins-Minerals (ALIVE WOMENS 50+ PO), Take by mouth daily., Disp: , Rfl:  .  Probiotic Product (PROBIOTIC ADVANCED) CAPS, daily. , Disp: , Rfl:  .  cephALEXin (KEFLEX) 500 MG capsule, Take 1 capsule (500 mg total) by mouth 3 (three) times daily., Disp: 21 capsule, Rfl: 0  Allergies  Allergen Reactions  . Hydromorphone Other (See Comments)    HYPOTENSION  . Meperidine Other (See Comments)    "BP drops"  . Sulfa Antibiotics Other (See Comments)    States she does not know the reaction     OBJECTIVE: Ht 5\' 5"  (1.651 m)   BMI 30.95 kg/m  Gen: No acute distress. Nontoxic in appearance.  HENT: AT. [place].  MMM.  Eyes:Pupils Equal Round Reactive to light, Extraocular movements intact,  Conjunctiva without redness, discharge or icterus.e.  Neuro: Normal gait. Alert. Oriented x3  Psych: Normal affect and demeanor. Normal speech. Normal thought content and judgment. Results for orders placed or performed in visit on 06/16/19 (from the past 24 hour(s))  POCT Urinalysis Dipstick (Automated)     Status: None   Collection Time: 06/16/19  2:53 PM  Result Value Ref Range   Color, UA yellow    Clarity, UA clear    Glucose, UA Negative Negative   Bilirubin, UA negative    Ketones, UA negative    Spec Grav, UA 1.010 1.010 - 1.025   Blood, UA negative    pH, UA 7.0 5.0 - 8.0   Protein, UA Negative Negative   Urobilinogen, UA 0.2 0.2 or 1.0 E.U./dL   Nitrite, UA negative    Leukocytes, UA Negative Negative     ASSESSMENT AND PLAN: Kayla Rosales is a 64 y.o. female present for  Dysuria Point-of-care urine does not look infectious today.  Discussed options with her and agreed to call in Keflex treatment to her pharmacy.  She will only pick up or start medication if symptoms worsening over the weekend.  Otherwise she will wait on starting medication until urine culture is received. Hydrate.Kidneys. Can try over-the-counter  Azo - POCT Urinalysis Dipstick (Automated) - Urine Culture Follow-up as needed  Howard Pouch, DO 06/16/2019     Orders Placed This Encounter  Procedures  . Urine Culture  . POCT Urinalysis Dipstick (Automated)   Meds ordered this encounter  Medications  . cephALEXin (KEFLEX) 500 MG capsule    Sig: Take 1 capsule (500 mg total) by mouth 3 (three) times daily.    Dispense:  21 capsule    Refill:  0   Referral Orders  No referral(s) requested today

## 2019-06-16 NOTE — Telephone Encounter (Signed)
Please call patient: Her electrolytes, liver, kidney function, blood cell counts and thyroid are all functioning within normal range. Her iron panel was normal with a very mildly elevated ferritin (iron stores).  There has not been much change in her ferritin level since the last time it was checked a few months ago.  Seems to be about her baseline but we will continue to monitor.  I have referred her to cardiology as we discussed.  Encouraged her to follow-up in 3-4 weeks if her headaches are not improving, sooner if worsening.

## 2019-06-16 NOTE — Telephone Encounter (Signed)
Pt was called and given information, she verbalized understanding  

## 2019-06-16 NOTE — Telephone Encounter (Signed)
Called and spoke with pt letting her know that I have printed the sleep study for her and she verbalized understanding asking to have it placed in the mail for her.  Pt also wanted to know what next steps recommendations might be per Dr. Halford Chessman. Pt stated she has seen ENT as well as a nutritionist and wants to know what might be able to happen next.  Dr. Halford Chessman, please advise on this.

## 2019-06-16 NOTE — Telephone Encounter (Signed)
Per Dr Raoul Pitch pt can be placed in 1:45 acute slot. Pt was called and message was left to return call. Pt will need to drop Urine specimen off before visit and do actual visit virtually.

## 2019-06-17 NOTE — Telephone Encounter (Signed)
LVMTCB x 1 for patient. 

## 2019-06-17 NOTE — Telephone Encounter (Signed)
Next steps depend on whether she has septorhinoplasty with plastic surgery as recommended by ENT in January, and how she is doing with weight loss.  Needs to have ROV with me or NP to discuss in more detail.  Can by video visit.

## 2019-06-18 ENCOUNTER — Telehealth (INDEPENDENT_AMBULATORY_CARE_PROVIDER_SITE_OTHER): Payer: Managed Care, Other (non HMO) | Admitting: Primary Care

## 2019-06-18 DIAGNOSIS — G4733 Obstructive sleep apnea (adult) (pediatric): Secondary | ICD-10-CM

## 2019-06-18 LAB — URINE CULTURE
MICRO NUMBER:: 10211668
SPECIMEN QUALITY:: ADEQUATE

## 2019-06-18 NOTE — Telephone Encounter (Signed)
Pt is scheduled for a video visit with Beth today. Message will be closed.

## 2019-06-18 NOTE — Patient Instructions (Addendum)
Recommendations: Continue to work on weight loss (goal lose 10-15% current weight) Follow up with ENT regarding possible septorhinoplasty  Referral: Medical weight management re: BMI 30  Follow-up: 6 months with Dr. Halford Chessman

## 2019-06-18 NOTE — Progress Notes (Signed)
Virtual Visit via Video Note  I connected with Kayla Rosales on 06/18/19 at  2:00 PM EST by a video enabled telemedicine application and verified that I am speaking with the correct person using two identifiers.  Location: Patient: Home Provider: Office    I discussed the limitations of evaluation and management by telemedicine and the availability of in person appointments. The patient expressed understanding and agreed to proceed.  History of Present Illness: 64 year old female, never smoked.  Past medical history significant for asthma, CVA, GERD, chronic nasal congestion, daytime sleepiness and snoring.  Patient of Dr. Halford Chessman, seen for initial sleep consult on 03/05/2019.  Home sleep test in January 2021 showed mild obstructive sleep apnea; AHI 6.5, minimum SpO2 was 88% (average 94%).   Previous [facility] pulmonary encounter: 05/06/2019  Patient contacted today by video to review home sleep test results.  He has mild obstructive sleep apnea with reports of daytime fatigue. She reports disruptive sleep pattern.  Her symptoms have improved in the past with weight loss.  She would like to hold off on CPAP therapy. She reports history of nasal deviated septum and would like to see ear nose and throat.  He also reports gluten sensitivity and up would appreciate help in weight loss.   06/18/2019 Patient contacted today to discuss next streps for treatment of her sleep apnea. 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 Recommended septorhinoplasty to improve nasal breathing. She was referred to Dr. Charlotta Newton with [facility]. Continue to encourage weight loss, she has improved in the past with this. Elected to proceed with nutrition consult which she did not find all that helpful. She would like to be referred to medical weight management. Current BMI is 30.95  Observations/Objective:  - Appears well, no  shortness of breath or wheezing/stridor  Assessment and Plan:  OSA: - Minimal obstructive sleep apnea; AHI 6.5, minimum SpO2 was 88% (average 94%).  - Seen by ENT who recommended septorhinoplasty (advised patient follow up back with ENT, surgery may not completely reverse OSA but will significantly benefit nasal breathing) - Continue to recommend weight loss, goal weight lose is 10-15% current weight - Consider repeat HST if she has made progressive with above recommendations   BMI 30: - Refer to medical weight management   Follow Up Instructions:   - FU in 6 months with Dr. Halford Chessman   I discussed the assessment and treatment plan with the patient. The patient was provided an opportunity to ask questions and all were answered. The patient agreed with the plan and demonstrated an understanding of the instructions.   The patient was advised to call back or seek an in-person evaluation if the symptoms worsen or if the condition fails to improve as anticipated.  I provided 18 minutes of non-face-to-face time during this encounter.   Martyn Ehrich, NP

## 2019-06-21 NOTE — Progress Notes (Signed)
Reviewed and agree with assessment/plan.   Emary Zalar, MD [facility] 04/10/2016, 12:24 PM Pager:  [phone]  

## 2019-07-12 ENCOUNTER — Other Ambulatory Visit: Payer: Self-pay

## 2019-07-12 ENCOUNTER — Encounter: Payer: Self-pay | Admitting: *Deleted

## 2019-07-12 ENCOUNTER — Ambulatory Visit (INDEPENDENT_AMBULATORY_CARE_PROVIDER_SITE_OTHER): Payer: Managed Care, Other (non HMO) | Admitting: Internal Medicine

## 2019-07-12 ENCOUNTER — Encounter: Payer: Self-pay | Admitting: Internal Medicine

## 2019-07-12 VITALS — BP 128/80 | HR 63 | Temp 96.3°F | Ht 64.5 in | Wt 187.0 lb

## 2019-07-12 DIAGNOSIS — E785 Hyperlipidemia, unspecified: Secondary | ICD-10-CM

## 2019-07-12 DIAGNOSIS — Z8673 Personal history of transient ischemic attack (TIA), and cerebral infarction without residual deficits: Secondary | ICD-10-CM | POA: Diagnosis not present

## 2019-07-12 DIAGNOSIS — R011 Cardiac murmur, unspecified: Secondary | ICD-10-CM

## 2019-07-12 DIAGNOSIS — E28319 Asymptomatic premature menopause: Secondary | ICD-10-CM

## 2019-07-12 DIAGNOSIS — R002 Palpitations: Secondary | ICD-10-CM | POA: Diagnosis not present

## 2019-07-12 DIAGNOSIS — R0683 Snoring: Secondary | ICD-10-CM

## 2019-07-12 NOTE — Patient Instructions (Addendum)
Medication Instructions:   STOP TAKING ATORVASTATIN   FOR 2 WEEKS  , CONTACT OFFICE TO INFORM DR Margaretann Loveless HOW YOU ARE FEELING -( LARGE MUSCLE ACHES IN THIGHS AND LEGS)    *If you need a refill on your cardiac medications before your next appointment, please call your pharmacy*   Lab Work: NOT NEEDED  Testing/Procedures: WILL BE SCHEDULE AT [place] [facility]  Your physician has requested that you have an echocardiogram. Echocardiography is a painless test that uses sound waves to create images of your heart. It provides your doctor with information about the size and shape of your heart and how well your heart's chambers and valves are working. This procedure takes approximately one hour. There are no restrictions for this procedure.  AND   Your physician has recommended that you wear a 14 DAY ZIO-PATCH monitor. The Zio patch cardiac monitor continuously records heart rhythm data for up to 14 days, this is for patients being evaluated for multiple types heart rhythms. For the first 24 hours post application, please avoid getting the Zio monitor wet in the shower or by excessive sweating during exercise. After that, feel free to carry on with regular activities. Keep soaps and lotions away from the ZIO XT Patch.  This will be mailed to you, please expect 7-10 days to receive.   [employer] location - [place], [address].         Follow-Up: At [facility], you and your health needs are our priority.  As part of our continuing mission to provide you with exceptional heart care, we have created designated Provider Care Teams.  These Care Teams include your primary Cardiologist (physician) and Advanced Practice Providers (APPs -  Physician Assistants and Nurse Practitioners) who all work together to provide you with the care you need, when you need it.     Your next appointment:    as needed  The format for your next appointment:   In Person  Provider:     Cherlynn Kaiser, MD   Other Instructions   [facility]ryn [facility]ull[facility]- Long Term Monitor Instructions   Your physician has requested you wear your ZIO patch monitor__14_____days.   This is a single patch monitor.  [employer] supplies one patch monitor per enrollment.  Additional stickers are not available.   Please do not apply patch if you will be having a Nuclear Stress Test, Echocardiogram, Cardiac CT, MRI, or Chest Xray during the time frame you would be wearing the monitor. The patch cannot be worn during these tests.  You cannot remove and re-apply the ZIO XT patch monitor.   Your ZIO patch monitor will be sent USPS Priority mail from [facility] directly to your home address. The monitor may also be mailed to a PO BOX if home delivery is not available.   It may take 3-5 days to receive your monitor after you have been enrolled.   Once you have received you monitor, please review enclosed instructions.  Your monitor has already been registered assigning a specific monitor serial # to you.   Applying the monitor   Shave hair from upper left chest.   Hold abrader disc by orange tab.  Rub abrader in 40 strokes over left upper chest as indicated in your monitor instructions.   Clean area with 4 enclosed alcohol pads .  Use all pads to assure are is cleaned thoroughly.  Let dry.   Apply patch as indicated in monitor instructions.  Patch  will be place under collarbone on left side of chest with arrow pointing upward.   Rub patch adhesive wings for 2 minutes.Remove white label marked "1".  Remove white label marked "2".  Rub patch adhesive wings for 2 additional minutes.   While looking in a mirror, press and release button in center of patch.  A small green light will flash 3-4 times .  This will be your only indicator the monitor has been turned on.     Do not shower for the first 24 hours.  You may shower after the first 24 hours.   Press button if you feel a symptom. You will hear a small  click.  Record Date, Time and Symptom in the Patient Log Book.   When you are ready to remove patch, follow instructions on last 2 pages of Patient Log Book.  Stick patch monitor onto last page of Patient Log Book.   Place Patient Log Book in [place] box.  Use locking tab on box and tape box closed securely.  The [employer] has [employer] on it.  Please place in mailbox as soon as possible.  Your physician should have your test results approximately 7 days after the monitor has been mailed back to [facility].   Call [facility] at [phone] if you have questions regarding your ZIO XT patch monitor.  Call them immediately if you see an orange light blinking on your monitor.   If your monitor falls off in less than 4 days contact our Monitor department at [phone].  If your monitor becomes loose or falls off after 4 days call [employer] at [phone] for suggestions on securing your monitor.

## 2019-07-12 NOTE — Progress Notes (Signed)
Patient ID: Kayla Rosales, female   DOB: 23-Jul-1955, 64 y.o.   MRN: [id number] Patient enrolled for [employer] to mail a 14 day ZIO XT long term holter monitor to her home.

## 2019-07-12 NOTE — Progress Notes (Signed)
Cardiology Office Note:    Date:  07/12/2019   ID:  Kayla Rosales, DOB 03-19-1956, MRN [id number]  PCP:  Ma Hillock, DO  Cardiologist:  Elouise Munroe, MD  Electrophysiologist:  None   Referring MD: Ma Hillock, DO   Chief Complaint: palpitations, murmur  History of Present Illness:    Kayla Rosales is a 64 y.o. female with a history of heart murmur, mild OSA with plan for possible septorhinoplasty, prior stroke while on HRT in 2017, no residual. She presents today for evaluation of palpitations.  She leads an active lifestyle, caring for her young granddaughter, as well as making frequent trips to see her father who was in his 81s and living independently.  She does have situational stressors, and has had poor quality sleep lately.  She also describes discomfort in her legs, not with exertion.  She has been told in the past that she has a murmur, no echocardiogram in our system to assess valve disease.   She denies a sense of chest pain or pressure. Has mild dyspnea with activity but describes less exercise lately with pandemic restrictions and overall daily demands of life. Describes palpitations occasionally. Denies syncope.  Palpitations: Caffeine: 1 cup of coffee on occasion. Snoring: mild OSA TSH: normal Herbal supplements/diet products: none Syncope/presyncope: none  Past Medical History:  Diagnosis Date  . Allergy   . Arthritis   . Asthma   . Chicken pox   . Colon polyps   . Diverticulitis   . Early menopause occurring in patient age younger than 53 years   . GERD (gastroesophageal reflux disease)   . 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 IBS (irritable bowel syndrome)   . Internal hemorrhoids 05/2018  . Lumbar herniated disc   . Stroke (cerebrum) ([place]) 2017   right UE/neck sx, resolved.     Past Surgical History:  Procedure Laterality Date  . COLONOSCOPY  2015   [name]- [place], [place]; 5 yr repeat recommended  . COLONOSCOPY W/ POLYPECTOMY   05/26/2018   x5 polyps  . KNEE ARTHROSCOPY Bilateral 2013   meniscus  x2  . NECK SURGERY  2018   disc fusion  . UPPER GASTROINTESTINAL ENDOSCOPY      Current Medications: Current Meds  Medication Sig  . albuterol (VENTOLIN HFA) 108 (90 Base) MCG/ACT inhaler Inhale 1-2 puffs into the lungs every 6 (six) hours as needed for wheezing or shortness of breath.  Marland Kitchen aspirin EC 81 MG tablet Take 81 mg by mouth daily.  Marland Kitchen atorvastatin (LIPITOR) 40 MG tablet Take 1 tablet (40 mg total) by mouth at bedtime.  . Cholecalciferol (D3-1000) 1000 units capsule Take 1,000 Units by mouth daily.  . Collagen Hydrolysate, Bovine, POWD by Does not apply route.  . cyanocobalamin 1000 MCG tablet Take 1,000 mcg by mouth daily.  Marland Kitchen esomeprazole (NEXIUM) 40 MG capsule Take 1 capsule (40 mg total) by mouth daily at 12 noon.  . famotidine (PEPCID) 40 MG tablet Take 1 tablet (40 mg total) by mouth daily. (Patient taking differently: Take 40 mg by mouth daily. As needed)  . fluticasone (FLONASE) 50 MCG/ACT nasal spray Place 2 sprays into both nostrils daily.  . magnesium 30 MG tablet Take 30 mg by mouth daily.  . Multiple Vitamins-Minerals (ALIVE WOMENS 50+ PO) Take by mouth daily.  . Probiotic Product (PROBIOTIC ADVANCED) CAPS daily.   . [DISCONTINUED] hydrOXYzine (ATARAX/VISTARIL) 10 MG tablet Take 1-5 tablets (10-50 mg total) by mouth at bedtime.  Allergies:   Hydromorphone, Meperidine, and Sulfa antibiotics   Social History   Socioeconomic History  . Marital status: Married    Spouse name: Not on file  . Number of children: 3  . Years of education: Not on file  . Highest education level: Not on file  Occupational History  . Occupation: retired Pharmacist, hospital  Tobacco Use  . Smoking status: Never Smoker  . Smokeless tobacco: Never Used  Substance and Sexual Activity  . Alcohol use: Yes    Alcohol/week: 1.0 standard drinks    Types: 1 Glasses of wine per week    Comment: 1/2-1 per day  . Drug use: Never  .  Sexual activity: Yes    Partners: Male    Birth control/protection: None    Comment: married,intercourse age 31, less than 5 sexual partners,des neg  Other Topics Concern  . Not on file  Social History Narrative   Married second family - 65 kids 15 grandkids blended   Haematologist, retired Pharmacist, hospital   Caffeine 2/day   Smoker - never   EtOH - rare   No drugs   Wears her seatbelt, smoke alarm at home.   Feels safe in her relationships.   Social Determinants of Health   Financial Resource Strain:   . Difficulty of Paying Living Expenses:   Food Insecurity:   . Worried About Charity fundraiser in the Last Year:   . Arboriculturist in the Last Year:   Transportation Needs:   . Film/video editor (Medical):   Marland Kitchen Lack of Transportation (Non-Medical):   Physical Activity:   . Days of Exercise per Week:   . Minutes of Exercise per Session:   Stress:   . Feeling of Stress :   Social Connections:   . Frequency of Communication with Friends and Family:   . Frequency of Social Gatherings with Friends and Family:   . Attends Religious Services:   . Active Member of Clubs or Organizations:   . Attends Archivist Meetings:   Marland Kitchen Marital Status:      Family History: The patient's family history includes Arthritis in her father; Breast cancer in her cousin; COPD in her sister; Depression in her mother; Depression (age of onset: 34) in her sister; Diabetes in her maternal grandmother; Drug abuse in her sister; Early death in her maternal grandfather, maternal grandmother, and sister; Early death (age of onset: 74) in her mother; Heart disease in her father, maternal grandfather, and mother; Hypertension in her mother; Lung cancer in her sister; Mental illness in her sister.  ROS:   Please see the history of present illness.    All other systems reviewed and are negative.  EKGs/Labs/Other Studies Reviewed:    The following studies were reviewed today:  EKG:  NSR, minimal  criteria for LVH.   Recent Labs: 12/17/2018: ALT 23 06/15/2019: BUN 11; Creatinine, Ser 0.63; Hemoglobin 14.4; Magnesium 1.9; Platelets 218.0; Potassium 4.0; Sodium 138; TSH 1.32  Recent Lipid Panel    Component Value Date/Time   CHOL 108 02/16/2019 1009   TRIG 121.0 02/16/2019 1009   HDL 33.90 (L) 02/16/2019 1009   CHOLHDL 3 02/16/2019 1009   VLDL 24.2 02/16/2019 1009   LDLCALC 50 02/16/2019 1009    Physical Exam:    VS:  BP 128/80 (BP Location: Left Arm, Patient Position: Sitting, Cuff Size: Normal)   Pulse 63   Temp (!) 96.3 F (35.7 C)   Ht 5' 4.5" (1.638 m)  Wt 187 lb (84.8 kg)   BMI 31.60 kg/m     Wt Readings from Last 5 Encounters:  07/12/19 187 lb (84.8 kg)  06/15/19 186 lb (84.4 kg)  05/20/19 183 lb 14.4 oz (83.4 kg)  03/18/19 181 lb (82.1 kg)  03/05/19 184 lb 9.6 oz (83.7 kg)    Constitutional: No acute distress Eyes: sclera non-icteric, normal conjunctiva and lids ENMT: mask in place Cardiovascular: regular rhythm, normal rate, no murmurs. S1 and S2 normal. Radial pulses normal bilaterally. No jugular venous distention.  Respiratory: clear to auscultation bilaterally GI : normal bowel sounds, soft and nontender. No distention.   MSK: extremities warm, well perfused. No edema.  NEURO: grossly nonfocal exam, moves all extremities. PSYCH: alert and oriented x 3, normal mood and affect.   ASSESSMENT:    1. Palpitations   2. History of stroke while on hormone replacement therapy   3. Murmur   4. Hyperlipidemia LDL goal <100   5. Snoring   6. Premature menopause    PLAN:    #1 Palpitations  #2 History of stroke while on hormone replacement therapy  With palpitations and a history of stroke, it is imperative that we exclude atrial fibrillation as a source.  We will perform a cardiac monitor to evaluate further.  In addition structure and function of the heart should be evaluated, we will obtain an echocardiogram. - Plan: EKG 12-Lead, ECHOCARDIOGRAM  COMPLETE, LONG TERM MONITOR (3-14 DAYS)  #3 Murmur She has been told in the past that she has a murmur.  While we obtain an echocardiogram for palpitations, we can evaluate this history of murmur as well.  - Plan: ECHOCARDIOGRAM COMPLETE  #4 Hyperlipidemia LDL goal <100 She has been on atorvastatin since her stroke, but is describing some bilateral lower extremity discomfort.  It is causing her to have less quality sleep and overall discomfort in the daytime.  We discussed a trial of statin cessation for statin myalgias.  She can discontinue the statin for 2 weeks and evaluate her symptoms.  If she sees improvement in her symptoms and would like to stop taking a statin, we can discuss PCSK9 inhibitors for risk reduction.  Most recent lipid panel is adequately controlled, therefore this is a reasonable consideration.  #5 snoring-mild OSA by previous testing, consideration of nasal surgery for treatment.  She is not actively pursuing surgical intervention at this time, but is considering in the future.  Cherlynn Kaiser, MD [place]  [facility]    Medication Adjustments/Labs and Tests Ordered: Current medicines are reviewed at length with the patient today.  Concerns regarding medicines are outlined above.  Orders Placed This Encounter  Procedures  . LONG TERM MONITOR (3-14 DAYS)  . EKG 12-Lead  . ECHOCARDIOGRAM COMPLETE   No orders of the defined types were placed in this encounter.   Patient Instructions  Medication Instructions:   STOP TAKING ATORVASTATIN   FOR 2 WEEKS  , CONTACT OFFICE TO INFORM DR Margaretann Loveless HOW YOU ARE FEELING -( LARGE MUSCLE ACHES IN THIGHS AND LEGS)    *If you need a refill on your cardiac medications before your next appointment, please call your pharmacy*   Lab Work: NOT NEEDED  Testing/Procedures: WILL BE SCHEDULE AT [address]  Your physician has requested that you have an echocardiogram. Echocardiography is a painless  test that uses sound waves to create images of your heart. It provides your doctor with information about the size and shape of your  heart and how well your heart's chambers and valves are working. This procedure takes approximately one hour. There are no restrictions for this procedure.  AND   Your physician has recommended that you wear a 14 DAY ZIO-PATCH monitor. The Zio patch cardiac monitor continuously records heart rhythm data for up to 14 days, this is for patients being evaluated for multiple types heart rhythms. For the first 24 hours post application, please avoid getting the Zio monitor wet in the shower or by excessive sweating during exercise. After that, feel free to carry on with regular activities. Keep soaps and lotions away from the ZIO XT Patch.  This will be mailed to you, please expect 7-10 days to receive.   [employer] location - [place], [address].         Follow-Up: At [facility], you and your health needs are our priority.  As part of our continuing mission to provide you with exceptional heart care, we have created designated Provider Care Teams.  These Care Teams include your primary Cardiologist (physician) and Advanced Practice Providers (APPs -  Physician Assistants and Nurse Practitioners) who all work together to provide you with the care you need, when you need it.     Your next appointment:    as needed  The format for your next appointment:   In Person  Provider:   Cherlynn Kaiser, MD   Other Instructions   Bryn Gulling- Long Term Monitor Instructions   Your physician has requested you wear your ZIO patch monitor__14_____days.   This is a single patch monitor.  [employer] supplies one patch monitor per enrollment.  Additional stickers are not available.   Please do not apply patch if you will be having a Nuclear Stress Test, Echocardiogram, Cardiac CT, MRI, or Chest Xray during the time frame you would be wearing the monitor. The patch  cannot be worn during these tests.  You cannot remove and re-apply the ZIO XT patch monitor.   Your ZIO patch monitor will be sent USPS Priority mail from [facility] directly to your home address. The monitor may also be mailed to a PO BOX if home delivery is not available.   It may take 3-5 days to receive your monitor after you have been enrolled.   Once you have received you monitor, please review enclosed instructions.  Your monitor has already been registered assigning a specific monitor serial # to you.   Applying the monitor   Shave hair from upper left chest.   Hold abrader disc by orange tab.  Rub abrader in 40 strokes over left upper chest as indicated in your monitor instructions.   Clean area with 4 enclosed alcohol pads .  Use all pads to assure are is cleaned thoroughly.  Let dry.   Apply patch as indicated in monitor instructions.  Patch will be place under collarbone on left side of chest with arrow pointing upward.   Rub patch adhesive wings for 2 minutes.Remove white label marked "1".  Remove white label marked "2".  Rub patch adhesive wings for 2 additional minutes.   While looking in a mirror, press and release button in center of patch.  A small green light will flash 3-4 times .  This will be your only indicator the monitor has been turned on.     Do not shower for the first 24 hours.  You may shower after the first 24 hours.   Press button if you feel a  symptom. You will hear a small click.  Record Date, Time and Symptom in the Patient Log Book.   When you are ready to remove patch, follow instructions on last 2 pages of Patient Log Book.  Stick patch monitor onto last page of Patient Log Book.   Place Patient Log Book in [place] box.  Use locking tab on box and tape box closed securely.  The [employer] and [employer] has [employer] on it.  Please place in mailbox as soon as possible.  Your physician should have your test results approximately 7 days after the  monitor has been mailed back to [facility].   Call [place] at [phone] if you have questions regarding your ZIO XT patch monitor.  Call them immediately if you see an orange light blinking on your monitor.   If your monitor falls off in less than 4 days contact our Monitor department at [phone].  If your monitor becomes loose or falls off after 4 days call [employer] at [phone] for suggestions on securing your monitor.

## 2019-07-14 ENCOUNTER — Encounter: Payer: Self-pay | Admitting: Family Medicine

## 2019-07-26 ENCOUNTER — Other Ambulatory Visit (HOSPITAL_COMMUNITY): Payer: Managed Care, Other (non HMO)

## 2019-07-30 ENCOUNTER — Other Ambulatory Visit: Payer: Self-pay

## 2019-07-30 ENCOUNTER — Ambulatory Visit (HOSPITAL_COMMUNITY): Payer: Managed Care, Other (non HMO) | Attending: Cardiology

## 2019-07-30 DIAGNOSIS — R002 Palpitations: Secondary | ICD-10-CM

## 2019-07-30 DIAGNOSIS — R011 Cardiac murmur, unspecified: Secondary | ICD-10-CM | POA: Diagnosis not present

## 2019-07-30 MED ORDER — ATORVASTATIN CALCIUM 20 MG PO TABS: 20.0000 mg | ORAL_TABLET | Freq: Every day | ORAL | 3 refills | Status: DC

## 2019-07-30 NOTE — Telephone Encounter (Signed)
Per Dr Margaretann Loveless, sent new prescription for Atorvastatin 20 mg . Discontinue previous dosage and changed medication list

## 2019-08-02 ENCOUNTER — Encounter: Payer: Self-pay | Admitting: Family Medicine

## 2019-08-02 ENCOUNTER — Telehealth: Payer: Self-pay | Admitting: Internal Medicine

## 2019-08-02 ENCOUNTER — Other Ambulatory Visit (INDEPENDENT_AMBULATORY_CARE_PROVIDER_SITE_OTHER): Payer: Managed Care, Other (non HMO)

## 2019-08-02 DIAGNOSIS — R002 Palpitations: Secondary | ICD-10-CM

## 2019-08-02 DIAGNOSIS — I5189 Other ill-defined heart diseases: Secondary | ICD-10-CM | POA: Insufficient documentation

## 2019-08-02 NOTE — Telephone Encounter (Signed)
Left message for patient to call back  

## 2019-08-02 NOTE — Telephone Encounter (Signed)
Per pt:"I have had more difficulty with my intestinal system. Last week I thought I had a stomach virus, throwing up and feeling nausea for days, but did not run a fever. This coincided with not being able to have a bowel movement. After taking miralax and fiber for days and finally 3 doses yesterday of the miralax, it finally broke through. I am worried that I may have a partial blockage or my diverticulosis is worsened"

## 2019-08-03 NOTE — Telephone Encounter (Signed)
Left message for patient to call back  

## 2019-08-05 NOTE — Telephone Encounter (Signed)
No return call from the patient 

## 2019-08-09 ENCOUNTER — Ambulatory Visit (INDEPENDENT_AMBULATORY_CARE_PROVIDER_SITE_OTHER): Payer: Managed Care, Other (non HMO) | Admitting: Nurse Practitioner

## 2019-08-09 ENCOUNTER — Encounter: Payer: Self-pay | Admitting: Nurse Practitioner

## 2019-08-09 ENCOUNTER — Other Ambulatory Visit (INDEPENDENT_AMBULATORY_CARE_PROVIDER_SITE_OTHER): Payer: Managed Care, Other (non HMO)

## 2019-08-09 ENCOUNTER — Telehealth: Payer: Self-pay | Admitting: *Deleted

## 2019-08-09 VITALS — BP 120/60 | HR 72 | Temp 98.5°F | Ht 65.0 in | Wt 184.0 lb

## 2019-08-09 DIAGNOSIS — R112 Nausea with vomiting, unspecified: Secondary | ICD-10-CM

## 2019-08-09 DIAGNOSIS — R109 Unspecified abdominal pain: Secondary | ICD-10-CM

## 2019-08-09 DIAGNOSIS — K5900 Constipation, unspecified: Secondary | ICD-10-CM

## 2019-08-09 LAB — CBC WITH DIFFERENTIAL/PLATELET
Basophils Absolute: 0.1 10*3/uL (ref 0.0–0.1)
Basophils Relative: 0.8 % (ref 0.0–3.0)
Eosinophils Absolute: 0.2 10*3/uL (ref 0.0–0.7)
Eosinophils Relative: 2.8 % (ref 0.0–5.0)
HCT: 41.1 % (ref 36.0–46.0)
Hemoglobin: 14.3 g/dL (ref 12.0–15.0)
Lymphocytes Relative: 22.8 % (ref 12.0–46.0)
Lymphs Abs: 1.6 10*3/uL (ref 0.7–4.0)
MCHC: 34.7 g/dL (ref 30.0–36.0)
MCV: 99 fl (ref 78.0–100.0)
Monocytes Absolute: 0.5 10*3/uL (ref 0.1–1.0)
Monocytes Relative: 7 % (ref 3.0–12.0)
Neutro Abs: 4.8 10*3/uL (ref 1.4–7.7)
Neutrophils Relative %: 66.6 % (ref 43.0–77.0)
Platelets: 237 10*3/uL (ref 150.0–400.0)
RBC: 4.15 Mil/uL (ref 3.87–5.11)
RDW: 12.8 % (ref 11.5–15.5)
WBC: 7.1 10*3/uL (ref 4.0–10.5)

## 2019-08-09 LAB — C-REACTIVE PROTEIN: CRP: <1 mg/dL (ref 0.5–20.0)

## 2019-08-09 LAB — LIPASE: Lipase: 33 U/L (ref 11.0–59.0)

## 2019-08-09 MED ORDER — LINACLOTIDE 72 MCG PO CAPS: 72.0000 ug | ORAL_CAPSULE | Freq: Every day | ORAL | 0 refills | Status: DC

## 2019-08-09 NOTE — Progress Notes (Signed)
S/w pt she states that she was told to put the monitor on after ECHO so she put ir on 4-19 and will have it on for 2 weeks

## 2019-08-09 NOTE — Patient Instructions (Addendum)
If you are age 65 or older, your body mass index should be between 23-30. Your Body mass index is 30.62 kg/m. If this is out of the aforementioned range listed, please consider follow up with your Primary Care Provider.  If you are age 68 or younger, your body mass index should be between 19-25. Your Body mass index is 30.62 kg/m. If this is out of the aformentioned range listed, please consider follow up with your Primary Care Provider.   You have been scheduled for a CT scan of the abdomen and pelvis at [place] ([address]---this is in the same building as Press [facility]).   You are scheduled on 08/18/2019 at 1:00 pm. You should arrive 15 minutes prior to your appointment time for registration. Please follow the written instructions below on the day of your exam:  WARNING: IF YOU ARE ALLERGIC TO IODINE/X-RAY DYE, PLEASE NOTIFY RADIOLOGY IMMEDIATELY AT [phone]! YOU WILL BE GIVEN A 13 HOUR PREMEDICATION PREP.  1) Do not eat or drink anything after 9:00 am (4 hours prior to your test) 2) You have been given 2 bottles of oral contrast to drink. The solution may taste better if refrigerated, but do NOT add ice or any other liquid to this solution. Shake well before drinking.    Drink 1 bottle of contrast @ 11:00 am (2 hours prior to your exam)  Drink 1 bottle of contrast @ 12:00pm (1 hour prior to your exam)  You may take any medications as prescribed with a small amount of water, if necessary. If you take any of the following medications: METFORMIN, GLUCOPHAGE, GLUCOVANCE, AVANDAMET, RIOMET, FORTAMET, [place] MET, JANUMET, GLUMETZA or METAGLIP, you MAY be asked to HOLD this medication 48 hours AFTER the exam.  The purpose of you drinking the oral contrast is to aid in the visualization of your intestinal tract. The contrast solution may cause some diarrhea. Depending on your individual set of symptoms, you may also receive an intravenous injection of x-ray  contrast/dye. Plan on being at [facility] for 30 minutes or longer, depending on the type of exam you are having performed.  This test typically takes 30-45 minutes to complete.  If you have any questions regarding your exam or if you need to reschedule, you may call the CT department at [phone] between the hours of 8:00 am and 5:00 pm, Monday-Friday.  ________________________________________________________________________ We have sent the following medications to your pharmacy for you to pick up at your convenience:  Linzess 72 mcg 1 tablet daily to be taken 30 minutes before breakfast.  Your provider has requested that you go to the basement level for lab work before leaving today. Press "B" on the elevator. The lab is located at the first door on the left as you exit the elevator.     Due to recent changes in healthcare laws, you may see the results of your imaging and laboratory studies on MyChart before your provider has had a chance to review them.  We understand that in some cases there may be results that are confusing or concerning to you. Not all laboratory results come back in the same time frame and the provider may be waiting for multiple results in order to interpret others.  Please give [place] 48 hours in order for your provider to thoroughly review all the results before contacting the office for clarification of your results.

## 2019-08-09 NOTE — Progress Notes (Signed)
Will await monitor results and will call back with result

## 2019-08-09 NOTE — Progress Notes (Signed)
08/09/2019 Kayla Rosales [id number] 1956/03/02   Chief Complaint: constipation, N/V and abdominal pain   History of Present Illness: Kayla Rosales is a 64 year old female with a past medical history of  CVA ( lacunar infarct of left basal ganglia) 07/2015 possible due to HRT, IBS-C, pelvic floor dysfunction, nonceliac gluten sensitivity,  lactose intolerance, diverticulosis and colon polys.  She presents today for further evaluation regarding nausea, vomiting, central abdominal pain and ongoing constipation.  She reports having more difficulty with her intestinal system.  Proximately 2 weeks ago, she felt constipated, no BM for a few days.  She then developed nausea and vomited partially digested food 4-5 times in 1 day.  She had generalized central abdominal pain at that time as well.  Her nausea persisted for the next few days without any further vomiting.  Her constipation continued.  She described feeling as if she might explode.  She went 4 days without having a BM.  She took 2-3 doses of MiraLAX for 3 days then her bowels emptied.  No rectal bleeding or melena.  She reports her stools are never hard.  Her stools are soft but the stool does not move out.  She dislikes using MiraLAX as it results in frequent unpredictable bowel movements which interferes with her activities of daily living.  She is maintained on a 100% gluten-free diet the past 8 to 9 years due to a gluten sensitivity.  In the past, he developed diarrhea after eating gluten gluten products.  She underwent a colonoscopy by Dr. Carlean Purl 05/26/2018, 5 sessile serrated polyps were removed from the colon and severe diverticulosis was noted to the sigmoid colon and mild diverticulosis to the right colon. Her stress level is elevated.  He frequently travels to take care of her elderly father.  She is having intermittent palpitations for which she saw cardiologist, Dr. Margaretann Loveless.  She is currently undergoing a ZIO monitor for 2 weeks.  No current  chest pain or palpitations. She underwent a Covid 19 test 1 week ago which was negative. No weight loss.  No dysphagia or heartburn. No other complaints today.   Colonoscopy 05/26/2018: - Five 1 to 6 mm sessile serrated polyps in the rectum, in the transverse colon and at the appendiceal orifice, removed with a cold snare. Resected and retrieved. - Severe diverticulosis in the sigmoid colon. There was narrowing of the colon in association with the diverticular opening. - Mild diverticulosis in the right colon. - Internal hemorrhoids. - The examination was otherwise normal on direct and retroflexion views. - 3 year recall.    Current Outpatient Medications on File Prior to Visit  Medication Sig Dispense Refill  . albuterol (VENTOLIN HFA) 108 (90 Base) MCG/ACT inhaler Inhale 1-2 puffs into the lungs every 6 (six) hours as needed for wheezing or shortness of breath. 6.7 g 5  . aspirin EC 81 MG tablet Take 81 mg by mouth daily.    Marland Kitchen atorvastatin (LIPITOR) 20 MG tablet Take 1 tablet (20 mg total) by mouth daily. 90 tablet 3  . Cholecalciferol (D3-1000) 1000 units capsule Take 1,000 Units by mouth daily.    . Collagen Hydrolysate, Bovine, POWD by Does not apply route.    . cyanocobalamin 1000 MCG tablet Take 1,000 mcg by mouth daily.    Marland Kitchen esomeprazole (NEXIUM) 40 MG capsule Take 1 capsule (40 mg total) by mouth daily at 12 noon. 90 capsule 1  . famotidine (PEPCID) 40 MG tablet Take 1 tablet (40 mg  total) by mouth daily. (Patient taking differently: Take 40 mg by mouth daily. As needed) 90 tablet 3  . fluticasone (FLONASE) 50 MCG/ACT nasal spray Place 2 sprays into both nostrils daily. 16 g 6  . Magnesium 250 MG TABS Take 1 tablet by mouth daily.    . Multiple Vitamins-Minerals (ALIVE WOMENS 50+ PO) Take by mouth daily.    . Probiotic Product (PROBIOTIC ADVANCED) CAPS daily.      No current facility-administered medications on file prior to visit.    Allergies  Allergen Reactions  .  Hydromorphone Other (See Comments)    HYPOTENSION  . Meperidine Other (See Comments)    "BP drops"  . Sulfa Antibiotics Other (See Comments)    States she does not know the reaction     Current Medications, Allergies, Past Medical History, Past Surgical History, Family History and Social History were reviewed in [employer] record.   Physical Exam: BP 120/60   Pulse 72   Temp 98.5 F (36.9 C)   Ht 5\' 5"  (1.651 m)   Wt 184 lb (83.5 kg)   BMI 30.62 kg/m   General: Well developed 89 female in no acute distress. Head: Normocephalic and atraumatic. Eyes: No scleral icterus. Conjunctiva pink . Ears: Normal auditory acuity. Mouth: Dentition intact. No ulcers or lesions.  Lungs: Clear throughout to auscultation. Heart: Regular rate and rhythm, no murmur. Abdomen: Soft, nondistended.  No tenderness left of the umbilicus without rebound or guarding.  No masses or hepatomegaly. Normal bowel sounds x 4 quadrants.  Rectal: Deferred.  Musculoskeletal: Symmetrical with no gross deformities. Extremities: No edema. Neurological: Alert oriented x 4. No focal deficits.  Psychological: Alert and cooperative. Normal mood and affect  Assessment and Recommendations:  9.  64 year old female with vomiting x 3-4 episodes on one single day with persistent nausea with intermittent generalized central abdominal discomfort.  Mild tenderness left of the umbilicus on exam today. -CBC, CMP, CRP and lipase -Abdominal/pelvic CT with oral and IV contrast -Patient to call our office if her nausea worsens or vomiting recurs  2.  Chronic constipation -Trial with Linzess 72 mcg 1 p.o. daily to be taken 30 minutes before breakfast -Relax as needed  3.  Gluten sensitivity contained on 100% gluten-free diet  4.  History of multiple sessile serrated colon polyps per colonoscopy 05/2018. -Recall colonoscopy 05/2021   Further follow-up to be determined after the above evaluation  completed

## 2019-08-09 NOTE — Telephone Encounter (Signed)
Order placed for  6 month visit  follouw up visit .  per  Echo note

## 2019-08-16 ENCOUNTER — Other Ambulatory Visit: Payer: Self-pay | Admitting: General Surgery

## 2019-08-16 ENCOUNTER — Other Ambulatory Visit (INDEPENDENT_AMBULATORY_CARE_PROVIDER_SITE_OTHER): Payer: Managed Care, Other (non HMO)

## 2019-08-16 DIAGNOSIS — R109 Unspecified abdominal pain: Secondary | ICD-10-CM

## 2019-08-16 DIAGNOSIS — R112 Nausea with vomiting, unspecified: Secondary | ICD-10-CM

## 2019-08-16 LAB — COMPREHENSIVE METABOLIC PANEL
ALT: 21 U/L (ref 0–35)
AST: 19 U/L (ref 0–37)
Albumin: 4.2 g/dL (ref 3.5–5.2)
Alkaline Phosphatase: 98 U/L (ref 39–117)
BUN: 12 mg/dL (ref 6–23)
CO2: 26 mEq/L (ref 19–32)
Calcium: 9.1 mg/dL (ref 8.4–10.5)
Chloride: 101 mEq/L (ref 96–112)
Creatinine, Ser: 0.63 mg/dL (ref 0.40–1.20)
GFR: 94.96 mL/min (ref >60.00)
Glucose, Bld: 92 mg/dL (ref 70–99)
Potassium: 3.9 mEq/L (ref 3.5–5.1)
Sodium: 140 mEq/L (ref 135–145)
Total Bilirubin: 0.9 mg/dL (ref 0.2–1.2)
Total Protein: 6.8 g/dL (ref 6.0–8.3)

## 2019-08-16 NOTE — Progress Notes (Signed)
Spoke with patient this morning, she will be in today to have this lab drawn for her imaging appointment on 08/18/2019

## 2019-08-17 ENCOUNTER — Other Ambulatory Visit: Payer: Self-pay

## 2019-08-17 ENCOUNTER — Ambulatory Visit (INDEPENDENT_AMBULATORY_CARE_PROVIDER_SITE_OTHER): Payer: Managed Care, Other (non HMO) | Admitting: Family Medicine

## 2019-08-17 ENCOUNTER — Encounter (INDEPENDENT_AMBULATORY_CARE_PROVIDER_SITE_OTHER): Payer: Self-pay | Admitting: Family Medicine

## 2019-08-17 VITALS — BP 113/74 | HR 64 | Temp 98.1°F | Ht 64.0 in | Wt 179.0 lb

## 2019-08-17 DIAGNOSIS — R5383 Other fatigue: Secondary | ICD-10-CM | POA: Diagnosis not present

## 2019-08-17 DIAGNOSIS — Z9189 Other specified personal risk factors, not elsewhere classified: Secondary | ICD-10-CM | POA: Diagnosis not present

## 2019-08-17 DIAGNOSIS — Z8673 Personal history of transient ischemic attack (TIA), and cerebral infarction without residual deficits: Secondary | ICD-10-CM

## 2019-08-17 DIAGNOSIS — Z1331 Encounter for screening for depression: Secondary | ICD-10-CM | POA: Diagnosis not present

## 2019-08-17 DIAGNOSIS — Z0289 Encounter for other administrative examinations: Secondary | ICD-10-CM

## 2019-08-17 DIAGNOSIS — E538 Deficiency of other specified B group vitamins: Secondary | ICD-10-CM | POA: Diagnosis not present

## 2019-08-17 DIAGNOSIS — R0602 Shortness of breath: Secondary | ICD-10-CM

## 2019-08-17 DIAGNOSIS — K581 Irritable bowel syndrome with constipation: Secondary | ICD-10-CM | POA: Diagnosis not present

## 2019-08-17 DIAGNOSIS — Z6830 Body mass index (BMI) 30.0-30.9, adult: Secondary | ICD-10-CM

## 2019-08-17 DIAGNOSIS — E669 Obesity, unspecified: Secondary | ICD-10-CM

## 2019-08-17 NOTE — Progress Notes (Signed)
Chief Complaint:   OBESITY Kayla Rosales (MR# [id number]) is a 64 y.o. female who presents for evaluation and treatment of obesity and related comorbidities. Current BMI is Body mass index is 30.73 kg/m. Kayla Rosales has been struggling with her weight for many years and has been unsuccessful in either losing weight, maintaining weight loss, or reaching her healthy weight goal.  Kayla Rosales is currently in the action stage of change and ready to dedicate time achieving and maintaining a healthier weight. Kayla Rosales is interested in becoming our patient and working on intensive lifestyle modifications including (but not limited to) diet and exercise for weight loss.  Kayla Rosales's habits were reviewed today and are as follows: Her family eats meals together, she thinks her family will eat healthier with her, her desired weight loss is 29-34 lbs, she started gaining weight in her 40's, and then 50's-60's, her heaviest weight ever was 187 pounds, she has significant food cravings issues, she snacks frequently in the evenings, she skips meals frequently, she is frequently drinking liquids with calories, she frequently eats larger portions than normal and she struggles with emotional eating.  Depression Screen Kayla Rosales's Food and Mood (modified PHQ-9) score was 13.  Depression screen PHQ 2/9 08/17/2019  Decreased Interest 1  Down, Depressed, Hopeless 1  PHQ - 2 Score 2  Altered sleeping 3  Tired, decreased energy 3  Change in appetite 1  Feeling bad or failure about yourself  1  Trouble concentrating 1  Moving slowly or fidgety/restless 2  Suicidal thoughts 0  PHQ-9 Score 13  Difficult doing work/chores Somewhat difficult   Subjective:   1. Other fatigue Kayla Rosales admits to daytime somnolence and admits to waking up still tired. Patent has a history of symptoms of daytime fatigue. Kayla Rosales generally gets 5 or 6 hours of sleep per night, and states that she has nightime awakenings. Snoring is present. Apneic  episodes are present. Epworth Sleepiness Score is 14.  2. Shortness of breath on exertion Kayla Rosales notes increasing shortness of breath with exercising and seems to be worsening over time with weight gain. She notes getting out of breath sooner with activity than she used to. This has not gotten worse recently. Kayla Rosales denies shortness of breath at rest or orthopnea.  3. Irritable bowel syndrome with constipation Kayla Rosales is currently on probiotic daily, miralax as needed, and benefiber q daily.  4. Vitamin B12 deficiency Kayla Rosales is currently on Vit B12 1,000 mcg q daily.  5. History of CVA (cerebrovascular accident) Kayla Rosales has a history of cerebrovascular accident, and is likely related to hormone replacement therapy that was started after early onset of menopause.  6. At risk for osteoporosis Kayla Rosales is at higher risk of osteopenia and osteoporosis due to Vitamin D deficiency.   Assessment/Plan:   1. Other fatigue Kayla Rosales does feel that her weight is causing her energy to be lower than it should be. Fatigue may be related to obesity, depression or many other causes. Labs will be ordered, and in the meanwhile, Kayla Rosales will focus on self care including making healthy food choices, increasing physical activity and focusing on stress reduction.  - Hemoglobin A1c - Insulin, random - VITAMIN D 25 Hydroxy (Vit-D Deficiency, Fractures)   2. Shortness of breath on exertion Kayla Rosales does feel that she gets out of breath more easily that she used to when she exercises. Kayla Rosales's shortness of breath appears to be obesity related and exercise induced. She has agreed to work on weight loss and gradually increase exercise  to treat her exercise induced shortness of breath. Will continue to monitor closely.  - Lipid Panel With LDL/HDL Ratio  3. Irritable bowel syndrome with constipation Kayla Rosales is to remain well hydrated, and will follow up as directed.  4. Vitamin B12 deficiency The diagnosis was reviewed  with the patient. We will continue to monitor. We will check labs today. Orders and follow up as documented in patient record.  5. History of CVA (cerebrovascular accident) We will check labs today, and will follow up.  - Lipid Panel With LDL/HDL Ratio  6. Depression screening Kayla Rosales had a positive depression screening. Depression is commonly associated with obesity and often results in emotional eating behaviors. We will monitor this closely and work on CBT to help improve the non-hunger eating patterns. Referral to Psychology may be required if no improvement is seen as she continues in our clinic.  7. At risk for osteoporosis Kayla Rosales was given approximately 30 minutes of  prevention counseling today. Kayla Rosales is at risk for osteopenia and osteoporosis due to her Vitamin D deficiency. She was encouraged to take her Vitamin D and follow her higher calcium diet and increase strengthening exercise to help strengthen her bones and decrease her risk of osteopenia and osteoporosis.  Repetitive spaced learning was employed today to elicit superior memory formation and behavioral change.  8. Class 1 obesity with serious comorbidity and body mass index (BMI) of 30.0 to 30.9 in adult, unspecified obesity type Kayla Rosales is currently in the action stage of change and her goal is to continue with weight loss efforts. I recommend Kayla Rosales begin the structured treatment plan as follows:  She has agreed to the Category 1 Plan, gluten free.  Exercise goals: As is.   Behavioral modification strategies: increasing lean protein intake, decreasing simple carbohydrates, no skipping meals and travel eating strategies.  She was informed of the importance of frequent follow-up visits to maximize her success with intensive lifestyle modifications for her multiple health conditions. She was informed we would discuss her lab results at her next visit unless there is a critical issue that needs to be addressed sooner. Kayla Rosales  agreed to keep her next visit at the agreed upon time to discuss these results.  Objective:   Blood pressure 113/74, pulse 64, temperature 98.1 F (36.7 C), temperature source Oral, height 5\' 4"  (1.626 m), weight 179 lb (81.2 kg), SpO2 97 %. Body mass index is 30.73 kg/m.  EKG: Normal sinus rhythm, rate 63 BPM.  Indirect Calorimeter completed today shows a VO2 of 207 and a REE of 1440.  Her calculated basal metabolic rate is [id number] thus her basal metabolic rate is worse than expected.  General: Cooperative, alert, well developed, in no acute distress. HEENT: Conjunctivae and lids unremarkable. Cardiovascular: Regular rhythm.  Lungs: Normal work of breathing. Neurologic: No focal deficits.   Lab Results  Component Value Date   CREATININE 0.63 08/16/2019   BUN 12 08/16/2019   NA 140 08/16/2019   K 3.9 08/16/2019   CL 101 08/16/2019   CO2 26 08/16/2019   Lab Results  Component Value Date   ALT 21 08/16/2019   AST 19 08/16/2019   ALKPHOS 98 08/16/2019   BILITOT 0.9 08/16/2019   Lab Results  Component Value Date   HGBA1C 5.3 12/17/2018   No results found for: INSULIN Lab Results  Component Value Date   TSH 1.32 06/15/2019   Lab Results  Component Value Date   CHOL 108 02/16/2019   HDL 33.90 (L) 02/16/2019  LDLCALC 50 02/16/2019   TRIG 121.0 02/16/2019   CHOLHDL 3 02/16/2019   Lab Results  Component Value Date   WBC 7.1 08/09/2019   HGB 14.3 08/09/2019   HCT 41.1 08/09/2019   MCV 99.0 08/09/2019   PLT 237.0 08/09/2019   Lab Results  Component Value Date   IRON 73 06/15/2019   TIBC 263 06/15/2019   FERRITIN 166 (H) 06/15/2019   Attestation Statements:   Reviewed by clinician on day of visit: allergies, medications, problem list, medical history, surgical history, family history, social history, and previous encounter notes.   I, Trixie Dredge, am acting as transcriptionist for Dennard Nip, MD.  I have reviewed the above documentation for accuracy and  completeness, and I agree with the above. - Dennard Nip, MD

## 2019-08-18 ENCOUNTER — Ambulatory Visit (INDEPENDENT_AMBULATORY_CARE_PROVIDER_SITE_OTHER)
Admission: RE | Admit: 2019-08-18 | Discharge: 2019-08-18 | Disposition: A | Discharge status: Home / Self Care | Payer: Managed Care, Other (non HMO) | Source: Ambulatory Visit | Attending: Nurse Practitioner | Admitting: Nurse Practitioner

## 2019-08-18 DIAGNOSIS — K5900 Constipation, unspecified: Secondary | ICD-10-CM

## 2019-08-18 DIAGNOSIS — R109 Unspecified abdominal pain: Secondary | ICD-10-CM | POA: Diagnosis not present

## 2019-08-18 DIAGNOSIS — R112 Nausea with vomiting, unspecified: Secondary | ICD-10-CM

## 2019-08-18 LAB — LIPID PANEL WITH LDL/HDL RATIO
Cholesterol, Total: 111 mg/dL (ref 100–199)
HDL: 40 mg/dL (ref >39)
LDL Chol Calc (NIH): 54 mg/dL (ref 0–99)
LDL/HDL Ratio: 1.4 ratio (ref 0.0–3.2)
Triglycerides: 85 mg/dL (ref 0–149)
VLDL Cholesterol Cal: 17 mg/dL (ref 5–40)

## 2019-08-18 LAB — HEMOGLOBIN A1C
Est. average glucose Bld gHb Est-mCnc: 108 mg/dL
Hgb A1c MFr Bld: 5.4 % (ref 4.8–5.6)

## 2019-08-18 LAB — VITAMIN D 25 HYDROXY (VIT D DEFICIENCY, FRACTURES): Vit D, 25-Hydroxy: 42.4 ng/mL (ref 30.0–100.0)

## 2019-08-18 LAB — INSULIN, RANDOM: INSULIN: 20 u[IU]/mL (ref 2.6–24.9)

## 2019-08-18 MED ORDER — IOHEXOL 300 MG/ML  SOLN
100.0000 mL | Freq: Once | INTRAMUSCULAR | Status: AC | PRN
  Administered 2019-08-18: 100 mL via INTRAVENOUS

## 2019-08-31 ENCOUNTER — Encounter (INDEPENDENT_AMBULATORY_CARE_PROVIDER_SITE_OTHER): Payer: Self-pay | Admitting: Family Medicine

## 2019-08-31 ENCOUNTER — Ambulatory Visit (INDEPENDENT_AMBULATORY_CARE_PROVIDER_SITE_OTHER): Payer: Managed Care, Other (non HMO) | Admitting: Family Medicine

## 2019-08-31 ENCOUNTER — Other Ambulatory Visit: Payer: Self-pay

## 2019-08-31 VITALS — BP 113/70 | HR 67 | Temp 97.7°F | Ht 64.0 in | Wt 171.0 lb

## 2019-08-31 DIAGNOSIS — Z9189 Other specified personal risk factors, not elsewhere classified: Secondary | ICD-10-CM | POA: Diagnosis not present

## 2019-08-31 DIAGNOSIS — Z6830 Body mass index (BMI) 30.0-30.9, adult: Secondary | ICD-10-CM

## 2019-08-31 DIAGNOSIS — E8881 Metabolic syndrome: Secondary | ICD-10-CM

## 2019-08-31 DIAGNOSIS — E7849 Other hyperlipidemia: Secondary | ICD-10-CM | POA: Diagnosis not present

## 2019-08-31 DIAGNOSIS — E669 Obesity, unspecified: Secondary | ICD-10-CM

## 2019-08-31 DIAGNOSIS — E559 Vitamin D deficiency, unspecified: Secondary | ICD-10-CM

## 2019-08-31 NOTE — Progress Notes (Signed)
Chief Complaint:   OBESITY Kayla Rosales is here to discuss her progress with her obesity treatment plan along with follow-up of her obesity related diagnoses. Kayla Rosales is on the Category 1 Plan and states she is following her eating plan approximately 95-100% of the time. Kayla Rosales states she is doing 0 minutes 0 times per week.  Today's visit was #: 2 Starting weight: 179 lbs Starting date: 08/17/2019 Today's weight: 171 lbs Today's date: 08/31/2019 Total lbs lost to date: 8 Total lbs lost since last in-office visit: 8  Interim History: Kayla Rosales notes the first week she felt fatigued and sluggish. The second week she was busy with caring for her father after a fall and having him transferred to a nursing home for continued care.  Subjective:   1. Vitamin D deficiency Kayla Rosales's Vit D level on 08/17/2019 was 42.4, goal of >50. She is on OTC Vit D supplementation, and is tolerating it well. I discussed labs with the patient today.  2. Insulin resistance Kayla Rosales's insulin level on 08/17/2019 was 20.0 and A1c on 08/17/2019 was 5.4. She is not on metformin. I discussed labs with the patient today.  3. Other hyperlipidemia Kayla Rosales's lipid panel on 08/17/2019, showed a total cholesterol of 111, triglycerides 85, HDL 40, and LDL 54. She is currently on atorvastatin 20 mg q daily, and is tolerating it well. LFTs checked on 08/16/2019 were within normal limits. I discussed labs with the patient today.  4. At risk for diabetes mellitus Kayla Rosales is at higher than average risk for developing diabetes due to her obesity and insulin resistance.   Assessment/Plan:   1. Vitamin D deficiency Low Vitamin D level contributes to fatigue and are associated with obesity, breast, and colon cancer. Kayla Rosales agreed to continue taking OTC Vit D 1,000 IU once weekly and will follow-up for routine testing of Vitamin D, at least 2-3 times per year to avoid over-replacement. We will recheck labs in 3 months.  2. Insulin  resistance Kayla Rosales will continue her meal plan, and will continue to work on weight loss, exercise, and decreasing simple carbohydrates to help decrease the risk of diabetes. we will recheck labs in 3 months. Kayla Rosales agreed to follow-up with [place] as directed to closely monitor her progress.  3. Other hyperlipidemia Cardiovascular risk and specific lipid/LDL goals reviewed. We discussed several lifestyle modifications today and Kayla Rosales will continue to work on diet, exercise and weight loss efforts. Kayla Rosales will continue her current statin therapy. Orders and follow up as documented in patient record.   4. At risk for diabetes mellitus Kayla Rosales was given approximately 30 minutes of diabetes education and counseling today. We discussed intensive lifestyle modifications today with an emphasis on weight loss as well as increasing exercise and decreasing simple carbohydrates in her diet. We also reviewed medication options with an emphasis on risk versus benefit of those discussed.   Repetitive spaced learning was employed today to elicit superior memory formation and behavioral change.  5. Class 1 obesity with serious comorbidity and body mass index (BMI) of 30.0 to 30.9 in adult, unspecified obesity type Kayla Rosales is currently in the action stage of change. As such, her goal is to continue with weight loss efforts. She has agreed to the Category 2 Plan.   Provided handout today on Insulin Resistance and Pre-diabetes. Kayla Rosales will increase to Category 2 meal plan to account for her active lifestyle.  Exercise goals: As is, active lifestyle.  Behavioral modification strategies: increasing lean protein intake, decreasing simple carbohydrates, decreasing  eating out, no skipping meals and meal planning and cooking strategies.  Kayla Rosales has agreed to follow-up with our clinic in 2 weeks with [facility], FNP-C. She was informed of the importance of frequent follow-up visits to maximize her success with intensive  lifestyle modifications for her multiple health conditions.   Objective:   Blood pressure 113/70, pulse 67, temperature 97.7 F (36.5 C), temperature source Oral, height 5\' 4"  (1.626 m), weight 171 lb (77.6 kg), SpO2 97 %. Body mass index is 29.35 kg/m.  General: Cooperative, alert, well developed, in no acute distress. HEENT: Conjunctivae and lids unremarkable. Cardiovascular: Regular rhythm.  Lungs: Normal work of breathing. Neurologic: No focal deficits.   Lab Results  Component Value Date   CREATININE 0.63 08/16/2019   BUN 12 08/16/2019   NA 140 08/16/2019   K 3.9 08/16/2019   CL 101 08/16/2019   CO2 26 08/16/2019   Lab Results  Component Value Date   ALT 21 08/16/2019   AST 19 08/16/2019   ALKPHOS 98 08/16/2019   BILITOT 0.9 08/16/2019   Lab Results  Component Value Date   HGBA1C 5.4 08/17/2019   HGBA1C 5.3 12/17/2018   Lab Results  Component Value Date   INSULIN 20.0 08/17/2019   Lab Results  Component Value Date   TSH 1.32 06/15/2019   Lab Results  Component Value Date   CHOL 111 08/17/2019   HDL 40 08/17/2019   LDLCALC 54 08/17/2019   TRIG 85 08/17/2019   CHOLHDL 3 02/16/2019   Lab Results  Component Value Date   WBC 7.1 08/09/2019   HGB 14.3 08/09/2019   HCT 41.1 08/09/2019   MCV 99.0 08/09/2019   PLT 237.0 08/09/2019   Lab Results  Component Value Date   IRON 73 06/15/2019   TIBC 263 06/15/2019   FERRITIN 166 (H) 06/15/2019   Attestation Statements:   Reviewed by clinician on day of visit: allergies, medications, problem list, medical history, surgical history, family history, social history, and previous encounter notes.   I, Trixie Dredge, am acting as transcriptionist for Dennard Nip, MD.  I have reviewed the above documentation for accuracy and completeness, and I agree with the above. -  Dennard Nip, MD

## 2019-09-16 ENCOUNTER — Telehealth: Payer: Self-pay | Admitting: Internal Medicine

## 2019-09-16 NOTE — Telephone Encounter (Signed)
Called patient- advised that they had not been reviewed by Dr.Acharya yet- but I would send a message to see if she can review and we would call her back. Patient verbalized understanding.

## 2019-09-16 NOTE — Telephone Encounter (Signed)
° ° °  Pt calling for her heart monitor results

## 2019-09-20 ENCOUNTER — Other Ambulatory Visit: Payer: Self-pay

## 2019-09-20 ENCOUNTER — Encounter (INDEPENDENT_AMBULATORY_CARE_PROVIDER_SITE_OTHER): Payer: Self-pay | Admitting: Family Medicine

## 2019-09-20 ENCOUNTER — Ambulatory Visit (INDEPENDENT_AMBULATORY_CARE_PROVIDER_SITE_OTHER): Payer: Managed Care, Other (non HMO) | Admitting: Family Medicine

## 2019-09-20 VITALS — BP 105/62 | HR 58 | Temp 98.3°F | Ht 64.0 in | Wt 166.0 lb

## 2019-09-20 DIAGNOSIS — E669 Obesity, unspecified: Secondary | ICD-10-CM | POA: Diagnosis not present

## 2019-09-20 DIAGNOSIS — F3289 Other specified depressive episodes: Secondary | ICD-10-CM

## 2019-09-20 DIAGNOSIS — Z6830 Body mass index (BMI) 30.0-30.9, adult: Secondary | ICD-10-CM

## 2019-09-20 DIAGNOSIS — F32A Depression, unspecified: Secondary | ICD-10-CM | POA: Insufficient documentation

## 2019-09-20 NOTE — Progress Notes (Signed)
Chief Complaint:   OBESITY Kayla Rosales is here to discuss her progress with her obesity treatment plan along with follow-up of her obesity related diagnoses. Kayla Rosales is on the Category 2 Plan and states she is following her eating plan approximately 80% of the time. Kayla Rosales states she is doing 0 minutes 0 times per week.  Today's visit was #: 3 Starting weight: 179 lbs Starting date: 08/17/2019 Today's weight: 166 lbs Today's date: 09/20/2019 Total lbs lost to date: 13 Total lbs lost since last in-office visit: 5  Interim History: Kayla Rosales notes more hunger over the past few weeks. She notes overeating extra calories. She has been getting her dad settled in a nursing home which has caused some emotional eating. She has gluten intolerance. She is substituting gluten free bread (322 cal/2 slices).  Subjective:   1. Other depression, with emotional eating  Kayla Rosales notes cravings in the afternoon and at night. She will often drink coffee when she is craving and this seems to help  Assessment/Plan:   1. Other depression, with emotional eating  Kayla Rosales will continue strategies to decrease cravings. Orders and follow up as documented in patient record.   2. Class 1 obesity with serious comorbidity and body mass index (BMI) of 30.0 to 30.9 in adult, unspecified obesity type Kayla Rosales is currently in the action stage of change. As such, her goal is to continue with weight loss efforts. She has agreed to the Category 2 Plan.   Handouts given today: Protein Equivalents, Breakfast Options, and Recipes.  Exercise goals: All adults should avoid inactivity. Some physical activity is better than none, and adults who participate in any amount of physical activity gain some health benefits.  Behavioral modification strategies: increasing lean protein intake, increasing water intake and meal planning and cooking strategies.  Kayla Rosales has agreed to follow-up with our clinic in 2 weeks. She was informed of the  importance of frequent follow-up visits to maximize her success with intensive lifestyle modifications for her multiple health conditions.   Objective:   Blood pressure 105/62, pulse (!) 58, temperature 98.3 F (36.8 C), temperature source Oral, height 5\' 4"  (1.626 m), weight 166 lb (75.3 kg), SpO2 98 %. Body mass index is 28.49 kg/m.  General: Cooperative, alert, well developed, in no acute distress. HEENT: Conjunctivae and lids unremarkable. Cardiovascular: Regular rhythm.  Lungs: Normal work of breathing. Neurologic: No focal deficits.   Lab Results  Component Value Date   CREATININE 0.63 08/16/2019   BUN 12 08/16/2019   NA 140 08/16/2019   K 3.9 08/16/2019   CL 101 08/16/2019   CO2 26 08/16/2019   Lab Results  Component Value Date   ALT 21 08/16/2019   AST 19 08/16/2019   ALKPHOS 98 08/16/2019   BILITOT 0.9 08/16/2019   Lab Results  Component Value Date   HGBA1C 5.4 08/17/2019   HGBA1C 5.3 12/17/2018   Lab Results  Component Value Date   INSULIN 20.0 08/17/2019   Lab Results  Component Value Date   TSH 1.32 06/15/2019   Lab Results  Component Value Date   CHOL 111 08/17/2019   HDL 40 08/17/2019   LDLCALC 54 08/17/2019   TRIG 85 08/17/2019   CHOLHDL 3 02/16/2019   Lab Results  Component Value Date   WBC 7.1 08/09/2019   HGB 14.3 08/09/2019   HCT 41.1 08/09/2019   MCV 99.0 08/09/2019   PLT 237.0 08/09/2019   Lab Results  Component Value Date   IRON 73 06/15/2019  TIBC 263 06/15/2019   FERRITIN 166 (H) 06/15/2019   Attestation Statements:   Reviewed by clinician on day of visit: allergies, medications, problem list, medical history, surgical history, family history, social history, and previous encounter notes.   Wilhemena Durie, am acting as Location manager for [employer], FNP-C.  I have reviewed the above documentation for accuracy and completeness, and I agree with the above. -  Georgianne Fick, FNP

## 2019-09-20 NOTE — Telephone Encounter (Signed)
Noted. Thanks.

## 2019-09-20 NOTE — Telephone Encounter (Signed)
Called patient personally with results.

## 2019-09-22 NOTE — Telephone Encounter (Signed)
PER ORDER, RECALL PLACED FOR 07/11/20 ( ONE YEAR FOLLOW UP OFFICE APPOINTMENT)

## 2019-10-05 ENCOUNTER — Ambulatory Visit (INDEPENDENT_AMBULATORY_CARE_PROVIDER_SITE_OTHER): Payer: Managed Care, Other (non HMO) | Admitting: Family Medicine

## 2019-10-05 ENCOUNTER — Encounter (INDEPENDENT_AMBULATORY_CARE_PROVIDER_SITE_OTHER): Payer: Self-pay | Admitting: Family Medicine

## 2019-10-05 ENCOUNTER — Other Ambulatory Visit: Payer: Self-pay

## 2019-10-05 VITALS — BP 125/71 | HR 57 | Temp 97.6°F | Ht 64.0 in | Wt 165.0 lb

## 2019-10-05 DIAGNOSIS — Z6830 Body mass index (BMI) 30.0-30.9, adult: Secondary | ICD-10-CM

## 2019-10-05 DIAGNOSIS — E669 Obesity, unspecified: Secondary | ICD-10-CM

## 2019-10-05 DIAGNOSIS — E8881 Metabolic syndrome: Secondary | ICD-10-CM | POA: Diagnosis not present

## 2019-10-07 NOTE — Progress Notes (Signed)
Chief Complaint:   OBESITY Kayla Rosales is here to discuss her progress with her obesity treatment plan along with follow-up of her obesity related diagnoses. Kayla Rosales is on the Category 2 Plan and states she is following her eating plan approximately 50% of the time. Kayla Rosales states she is doing 0 minutes 0 times per week.  Today's visit was #: 4 Starting weight: 179 lbs Starting date: 08/17/2019 Today's weight: 165 lbs Today's date: 10/05/2019 Total lbs lost to date: 14 Total lbs lost since last in-office visit: 1  Interim History: Kayla Rosales notes being off the plan a great deal due to helping her daughter move. She did not get enough protein in.  Subjective:   1. Insulin resistance [name] has a diagnosis of insulin resistance based on her elevated fasting insulin level >5. She notes polyphagia when she is not getting enough protein. She continues to work on diet and exercise to decrease her risk of diabetes.  Lab Results  Component Value Date   INSULIN 20.0 08/17/2019   Lab Results  Component Value Date   HGBA1C 5.4 08/17/2019   Assessment/Plan:   1. Insulin resistance Kayla Rosales will continue her meal plan, and will continue to work on weight loss, exercise, and decreasing simple carbohydrates to help decrease the risk of diabetes. Kayla Rosales agreed to follow-up with [place] as directed to closely monitor her progress.  2. Class 1 obesity with serious comorbidity and body mass index (BMI) of 30.0 to 30.9 in adult, unspecified obesity type Kayla Rosales is currently in the action stage of change. As such, her goal is to continue with weight loss efforts. She has agreed to the Category 2 Plan and keeping a food journal and adhering to recommended goals of 400-500 calories and 35 grams of protein daily.   Exercise goals: No exercise has been prescribed at this time.  Behavioral modification strategies: increasing lean protein intake, decreasing simple carbohydrates, increasing vegetables and planning for  success.  Kayla Rosales has agreed to follow-up with our clinic in 3 weeks. She was informed of the importance of frequent follow-up visits to maximize her success with intensive lifestyle modifications for her multiple health conditions.   Objective:   Blood pressure 125/71, pulse (!) 57, temperature 97.6 F (36.4 C), temperature source Oral, height 5\' 4"  (1.626 m), weight 165 lb (74.8 kg), SpO2 98 %. Body mass index is 28.32 kg/m.  General: Cooperative, alert, well developed, in no acute distress. HEENT: Conjunctivae and lids unremarkable. Cardiovascular: Regular rhythm.  Lungs: Normal work of breathing. Neurologic: No focal deficits.   Lab Results  Component Value Date   CREATININE 0.63 08/16/2019   BUN 12 08/16/2019   NA 140 08/16/2019   K 3.9 08/16/2019   CL 101 08/16/2019   CO2 26 08/16/2019   Lab Results  Component Value Date   ALT 21 08/16/2019   AST 19 08/16/2019   ALKPHOS 98 08/16/2019   BILITOT 0.9 08/16/2019   Lab Results  Component Value Date   HGBA1C 5.4 08/17/2019   HGBA1C 5.3 12/17/2018   Lab Results  Component Value Date   INSULIN 20.0 08/17/2019   Lab Results  Component Value Date   TSH 1.32 06/15/2019   Lab Results  Component Value Date   CHOL 111 08/17/2019   HDL 40 08/17/2019   LDLCALC 54 08/17/2019   TRIG 85 08/17/2019   CHOLHDL 3 02/16/2019   Lab Results  Component Value Date   WBC 7.1 08/09/2019   HGB 14.3 08/09/2019   HCT  41.1 08/09/2019   MCV 99.0 08/09/2019   PLT 237.0 08/09/2019   Lab Results  Component Value Date   IRON 73 06/15/2019   TIBC 263 06/15/2019   FERRITIN 166 (H) 06/15/2019   Attestation Statements:   Reviewed by clinician on day of visit: allergies, medications, problem list, medical history, surgical history, family history, social history, and previous encounter notes.   Wilhemena Durie, am acting as Location manager for [employer], FNP-C.  I have reviewed the above documentation for accuracy and  completeness, and I agree with the above. - Georgianne Fick, FNP

## 2019-10-11 ENCOUNTER — Encounter (INDEPENDENT_AMBULATORY_CARE_PROVIDER_SITE_OTHER): Payer: Self-pay | Admitting: Family Medicine

## 2019-10-11 DIAGNOSIS — E8881 Metabolic syndrome: Secondary | ICD-10-CM | POA: Insufficient documentation

## 2019-10-11 DIAGNOSIS — E88819 Insulin resistance, unspecified: Secondary | ICD-10-CM | POA: Insufficient documentation

## 2019-10-27 ENCOUNTER — Other Ambulatory Visit: Payer: Self-pay

## 2019-10-27 ENCOUNTER — Encounter (INDEPENDENT_AMBULATORY_CARE_PROVIDER_SITE_OTHER): Payer: Self-pay | Admitting: Family Medicine

## 2019-10-27 ENCOUNTER — Ambulatory Visit (INDEPENDENT_AMBULATORY_CARE_PROVIDER_SITE_OTHER): Payer: Managed Care, Other (non HMO) | Admitting: Family Medicine

## 2019-10-27 VITALS — BP 122/68 | HR 55 | Ht 64.0 in | Wt 161.0 lb

## 2019-10-27 DIAGNOSIS — E7849 Other hyperlipidemia: Secondary | ICD-10-CM | POA: Diagnosis not present

## 2019-10-27 DIAGNOSIS — E669 Obesity, unspecified: Secondary | ICD-10-CM | POA: Diagnosis not present

## 2019-10-27 DIAGNOSIS — Z6830 Body mass index (BMI) 30.0-30.9, adult: Secondary | ICD-10-CM

## 2019-10-28 ENCOUNTER — Encounter (INDEPENDENT_AMBULATORY_CARE_PROVIDER_SITE_OTHER): Payer: Self-pay | Admitting: Family Medicine

## 2019-10-28 NOTE — Telephone Encounter (Signed)
Please see

## 2019-10-28 NOTE — Telephone Encounter (Signed)
Please advise 

## 2019-10-28 NOTE — Progress Notes (Signed)
Chief Complaint:   OBESITY Kayla Rosales is here to discuss her progress with her obesity treatment plan along with follow-up of her obesity related diagnoses. Kayla Rosales is on the Category 2 Plan and keeping a food journal and adhering to recommended goals of 400-500 calories and 35 grams of proteinat supper daily and states she is following her eating plan approximately 80% of the time. Kayla Rosales states she is doing 0 minutes 0 times per week.  Today's visit was #: 5 Starting weight: 179 lbs Starting date: 08/17/2019 Today's weight: 161 lbs Today's date: 10/27/2019 Total lbs lost to date: 18 Total lbs lost since last in-office visit: 4  Interim History: Kayla Rosales notes she has had less digestion issues since starting on the plan. 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 She is getting the protein in. She notes that she needs to eat more veggies. She is near her weight goal of 155 lbs.  Subjective:   1. Other hyperlipidemia Kayla Rosales has hyperlipidemia and has been trying to improve her cholesterol levels with intensive lifestyle modification including a low saturated fat diet, exercise and weight loss. Last LDL, HDL, and triglycerides were within normal limits. She is on 20 mg of atorvastatin.   Lab Results  Component Value Date   ALT 21 08/16/2019   AST 19 08/16/2019   ALKPHOS 98 08/16/2019   BILITOT 0.9 08/16/2019   Lab Results  Component Value Date   CHOL 111 08/17/2019   HDL 40 08/17/2019   LDLCALC 54 08/17/2019   TRIG 85 08/17/2019   CHOLHDL 3 02/16/2019   Assessment/Plan:   1. Other hyperlipidemia Cardiovascular risk and specific lipid/LDL goals reviewed. We discussed several lifestyle modifications today. Kayla Rosales will continue atorvastatin, and will continue to work on diet, exercise and weight loss efforts.   2. Class 1 obesity with serious comorbidity and body mass index (BMI) of 30.0 to 30.9 in adult, unspecified obesity type Kayla Rosales is currently in the action stage of  change. As such, her goal is to continue with weight loss efforts. She has agreed to the Category 1 Plan or the Category 2 Plan.   Exercise goals: All adults should avoid inactivity. Some physical activity is better than none, and adults who participate in any amount of physical activity gain some health benefits. We discussed joining [employer].  Behavioral modification strategies: increasing vegetables.  Kayla Rosales has agreed to follow-up with our clinic in 3 weeks. She was informed of the importance of frequent follow-up visits to maximize her success with intensive lifestyle modifications for her multiple health conditions.   Objective:   Blood pressure 122/68, pulse (!) 55, height 5\' 4"  (1.626 m), weight 161 lb (73 kg), SpO2 100 %. Body mass index is 27.64 kg/m.  General: Cooperative, alert, well developed, in no acute distress. HEENT: Conjunctivae and lids unremarkable. Cardiovascular: Regular rhythm.  Lungs: Normal work of breathing. Neurologic: No focal deficits.   Lab Results  Component Value Date   CREATININE 0.63 08/16/2019   BUN 12 08/16/2019   NA 140 08/16/2019   K 3.9 08/16/2019   CL 101 08/16/2019   CO2 26 08/16/2019   Lab Results  Component Value Date   ALT 21 08/16/2019   AST 19 08/16/2019   ALKPHOS 98 08/16/2019   BILITOT 0.9 08/16/2019   Lab Results  Component Value Date   HGBA1C 5.4 08/17/2019   HGBA1C 5.3 12/17/2018   Lab Results  Component Value Date   INSULIN 20.0 08/17/2019   Lab Results  Component Value Date   TSH 1.32 06/15/2019   Lab Results  Component Value Date   CHOL 111 08/17/2019   HDL 40 08/17/2019   LDLCALC 54 08/17/2019   TRIG 85 08/17/2019   CHOLHDL 3 02/16/2019   Lab Results  Component Value Date   WBC 7.1 08/09/2019   HGB 14.3 08/09/2019   HCT 41.1 08/09/2019   MCV 99.0 08/09/2019   PLT 237.0 08/09/2019   Lab Results  Component Value Date   IRON 73 06/15/2019   TIBC 263 06/15/2019   FERRITIN 166 (H) 06/15/2019    Attestation Statements:   Reviewed by clinician on day of visit: allergies, medications, problem list, medical history, surgical history, family history, social history, and previous encounter notes.   Wilhemena Durie, am acting as Location manager for [employer], FNP-C.  I have reviewed the above documentation for accuracy and completeness, and I agree with the above. -  Georgianne Fick, FNP

## 2019-10-28 NOTE — Telephone Encounter (Signed)
Patient seen [facility] yesterday

## 2019-10-29 ENCOUNTER — Encounter (INDEPENDENT_AMBULATORY_CARE_PROVIDER_SITE_OTHER): Payer: Self-pay | Admitting: Family Medicine

## 2019-11-17 ENCOUNTER — Ambulatory Visit (INDEPENDENT_AMBULATORY_CARE_PROVIDER_SITE_OTHER): Payer: Managed Care, Other (non HMO) | Admitting: Family Medicine

## 2019-11-17 ENCOUNTER — Encounter (INDEPENDENT_AMBULATORY_CARE_PROVIDER_SITE_OTHER): Payer: Self-pay | Admitting: Family Medicine

## 2019-11-17 ENCOUNTER — Other Ambulatory Visit: Payer: Self-pay

## 2019-11-17 VITALS — BP 111/64 | HR 60 | Temp 97.4°F | Ht 64.0 in | Wt 159.0 lb

## 2019-11-17 DIAGNOSIS — E669 Obesity, unspecified: Secondary | ICD-10-CM | POA: Diagnosis not present

## 2019-11-17 DIAGNOSIS — Z6830 Body mass index (BMI) 30.0-30.9, adult: Secondary | ICD-10-CM | POA: Diagnosis not present

## 2019-11-17 DIAGNOSIS — E8881 Metabolic syndrome: Secondary | ICD-10-CM | POA: Diagnosis not present

## 2019-11-17 NOTE — Progress Notes (Signed)
Chief Complaint:   OBESITY Kayla Rosales is here to discuss her progress with her obesity treatment plan along with follow-up of her obesity related diagnoses. Kayla Rosales is on the Category 1 Plan or the Category 2 Plan and states she is following her eating plan approximately 60-70% of the time. Kayla Rosales states she is doing 0 minutes 0 times per week.  Today's visit was #: 6 Starting weight: 179 lbs Starting date: 08/17/2019 Today's weight: 159 lbs Today's date: 11/17/2019 Total lbs lost to date: 20 Total lbs lost since last in-office visit: 2  Interim History: Kayla Rosales is nearly at her goal weight (155 lbs). She now says she would like to lose down to 150 lbs and have a range of 150-155 lbs.  She avoids gluten.  Subjective:   1. Insulin resistance Kayla Rosales has a diagnosis of insulin resistance based on her elevated fasting insulin level >5. She is not on metformin and she denies polyphagia. She continues to work on diet and exercise to decrease her risk of diabetes.  Lab Results  Component Value Date   INSULIN 20.0 08/17/2019   Lab Results  Component Value Date   HGBA1C 5.4 08/17/2019   Assessment/Plan:   1. Insulin resistance Kayla Rosales will continue her meal plan, and will continue to work on weight loss, exercise, and decreasing simple carbohydrates to help decrease the risk of diabetes. Kayla Rosales agreed to follow-up with [place] as directed to closely monitor her progress.  2. Class 1 obesity with serious comorbidity and body mass index (BMI) of 30.0 to 30.9 in adult, unspecified obesity type Kayla Rosales is currently in the action stage of change. As such, her goal is to continue with weight loss efforts. She has agreed to the Category 1 Plan + 100 calories.   Exercise goals: No exercise has been prescribed at this time.  Behavioral modification strategies: increasing lean protein intake.  Kayla Rosales has agreed to follow-up with our clinic in 3 weeks. She was informed of the importance of frequent  follow-up visits to maximize her success with intensive lifestyle modifications for her multiple health conditions.   Objective:   Blood pressure 111/64, pulse 60, temperature (!) 97.4 F (36.3 C), height 5\' 4"  (1.626 m), weight 159 lb (72.1 kg), SpO2 98 %. Body mass index is 27.29 kg/m.  General: Cooperative, alert, well developed, in no acute distress. HEENT: Conjunctivae and lids unremarkable. Cardiovascular: Regular rhythm.  Lungs: Normal work of breathing. Neurologic: No focal deficits.   Lab Results  Component Value Date   CREATININE 0.63 08/16/2019   BUN 12 08/16/2019   NA 140 08/16/2019   K 3.9 08/16/2019   CL 101 08/16/2019   CO2 26 08/16/2019   Lab Results  Component Value Date   ALT 21 08/16/2019   AST 19 08/16/2019   ALKPHOS 98 08/16/2019   BILITOT 0.9 08/16/2019   Lab Results  Component Value Date   HGBA1C 5.4 08/17/2019   HGBA1C 5.3 12/17/2018   Lab Results  Component Value Date   INSULIN 20.0 08/17/2019   Lab Results  Component Value Date   TSH 1.32 06/15/2019   Lab Results  Component Value Date   CHOL 111 08/17/2019   HDL 40 08/17/2019   LDLCALC 54 08/17/2019   TRIG 85 08/17/2019   CHOLHDL 3 02/16/2019   Lab Results  Component Value Date   WBC 7.1 08/09/2019   HGB 14.3 08/09/2019   HCT 41.1 08/09/2019   MCV 99.0 08/09/2019   PLT 237.0 08/09/2019  Lab Results  Component Value Date   IRON 73 06/15/2019   TIBC 263 06/15/2019   FERRITIN 166 (H) 06/15/2019   Attestation Statements:   Reviewed by clinician on day of visit: allergies, medications, problem list, medical history, surgical history, family history, social history, and previous encounter notes.   Wilhemena Durie, am acting as Location manager for [employer], FNP-C.  I have reviewed the above documentation for accuracy and completeness, and I agree with the above. -  Georgianne Fick, FNP

## 2019-11-22 ENCOUNTER — Encounter (INDEPENDENT_AMBULATORY_CARE_PROVIDER_SITE_OTHER): Payer: Self-pay | Admitting: Family Medicine

## 2019-12-08 ENCOUNTER — Ambulatory Visit (INDEPENDENT_AMBULATORY_CARE_PROVIDER_SITE_OTHER): Payer: Managed Care, Other (non HMO) | Admitting: Family Medicine

## 2019-12-09 ENCOUNTER — Other Ambulatory Visit: Payer: Self-pay

## 2019-12-09 ENCOUNTER — Encounter (INDEPENDENT_AMBULATORY_CARE_PROVIDER_SITE_OTHER): Payer: Self-pay | Admitting: Family Medicine

## 2019-12-09 ENCOUNTER — Ambulatory Visit (INDEPENDENT_AMBULATORY_CARE_PROVIDER_SITE_OTHER): Payer: Managed Care, Other (non HMO) | Admitting: Family Medicine

## 2019-12-09 VITALS — BP 113/64 | HR 60 | Temp 97.8°F | Ht 64.0 in | Wt 158.0 lb

## 2019-12-09 DIAGNOSIS — E559 Vitamin D deficiency, unspecified: Secondary | ICD-10-CM

## 2019-12-09 DIAGNOSIS — Z9189 Other specified personal risk factors, not elsewhere classified: Secondary | ICD-10-CM | POA: Diagnosis not present

## 2019-12-09 DIAGNOSIS — E7849 Other hyperlipidemia: Secondary | ICD-10-CM

## 2019-12-09 DIAGNOSIS — E8881 Metabolic syndrome: Secondary | ICD-10-CM

## 2019-12-09 DIAGNOSIS — E669 Obesity, unspecified: Secondary | ICD-10-CM

## 2019-12-09 DIAGNOSIS — Z6830 Body mass index (BMI) 30.0-30.9, adult: Secondary | ICD-10-CM

## 2019-12-09 NOTE — Progress Notes (Signed)
Chief Complaint:   OBESITY Kayla Rosales is here to discuss her progress with her obesity treatment plan along with follow-up of her obesity related diagnoses. Kayla Rosales is on the Category 1 Plan + 100 calories and states she is following her eating plan approximately 60% of the time. Kayla Rosales states she is swimming and walking 1 mile for 30 minutes 7 times per week.  Today's visit was #: 7 Starting weight: 179 lbs Starting date: 08/17/2019 Today's weight: 158 lbs Today's date: 12/09/2019 Total lbs lost to date: 21 Total lbs lost since last in-office visit: 1  Interim History: Kayla Rosales was surprised by her weight loss in the past few weeks. She did go out to eat and did have some wine indulgences. She has been traveling to and from [place] secondary to taking care of her father.  Subjective:   1. Insulin resistance Kayla Rosales's last A1c was 5.4 and insulin 20.0. She is not on metformin.  2. Other hyperlipidemia Kayla Rosales's last LDL was 54, triglycerides 85, and HDL 40. She is on statin and denies myalgias.  3. Vitamin D deficiency Kayla Rosales is not on prescription Vit D, and she notes fatigue. Last Vit D level was 42.4.  4. At risk for diabetes mellitus Kayla Rosales is at higher than average risk for developing diabetes due to her obesity.   Assessment/Plan:   1. Insulin resistance Kayla Rosales will continue to work on weight loss, exercise, and decreasing simple carbohydrates to help decrease the risk of diabetes. We will check labs today. Kayla Rosales agreed to follow-up with [place] as directed to closely monitor her progress.  - Hemoglobin A1c - Insulin, random  2. Other hyperlipidemia Cardiovascular risk and specific lipid/LDL goals reviewed. We discussed several lifestyle modifications today and Kayla Rosales will continue to work on diet, exercise and weight loss efforts. We will check labs today. Orders and follow up as documented in patient record.   Counseling Intensive lifestyle modifications are the first line  treatment for this issue. . Dietary changes: Increase soluble fiber. Decrease simple carbohydrates. . Exercise changes: Moderate to vigorous-intensity aerobic activity 150 minutes per week if tolerated. . Lipid-lowering medications: see documented in medical record.  - Comprehensive metabolic panel - Lipid Panel With LDL/HDL Ratio  3. Vitamin D deficiency Low Vitamin D level contributes to fatigue and are associated with obesity, breast, and colon cancer. We will check labs today. Kayla Rosales will follow-up for routine testing of Vitamin D, at least 2-3 times per year to avoid over-replacement.  - VITAMIN D 25 Hydroxy (Vit-D Deficiency, Fractures)  4. At risk for diabetes mellitus Kayla Rosales was given approximately 15 minutes of diabetes education and counseling today. We discussed intensive lifestyle modifications today with an emphasis on weight loss as well as increasing exercise and decreasing simple carbohydrates in her diet. We also reviewed medication options with an emphasis on risk versus benefit of those discussed.   Repetitive spaced learning was employed today to elicit superior memory formation and behavioral change.  5. Class 1 obesity with serious comorbidity and body mass index (BMI) of 30.0 to 30.9 in adult, unspecified obesity type Kayla Rosales is currently in the action stage of change. As such, her goal is to continue with weight loss efforts. She has agreed to the Category 1 Plan + 100 calories.   Exercise goals: As is.  Behavioral modification strategies: increasing lean protein intake, increasing vegetables, meal planning and cooking strategies, keeping healthy foods in the home and planning for success.  Kayla Rosales has agreed to follow-up with our  clinic in 2 to 3 weeks. She was informed of the importance of frequent follow-up visits to maximize her success with intensive lifestyle modifications for her multiple health conditions.   Kayla Rosales was informed we would discuss her lab results  at her next visit unless there is a critical issue that needs to be addressed sooner. Kayla Rosales agreed to keep her next visit at the agreed upon time to discuss these results.  Objective:   Blood pressure 113/64, pulse 60, temperature 97.8 F (36.6 C), temperature source Oral, height 5\' 4"  (1.626 m), weight 158 lb (71.7 kg), SpO2 98 %. Body mass index is 27.12 kg/m.  General: Cooperative, alert, well developed, in no acute distress. HEENT: Conjunctivae and lids unremarkable. Cardiovascular: Regular rhythm.  Lungs: Normal work of breathing. Neurologic: No focal deficits.   Lab Results  Component Value Date   CREATININE 0.63 08/16/2019   BUN 12 08/16/2019   NA 140 08/16/2019   K 3.9 08/16/2019   CL 101 08/16/2019   CO2 26 08/16/2019   Lab Results  Component Value Date   ALT 21 08/16/2019   AST 19 08/16/2019   ALKPHOS 98 08/16/2019   BILITOT 0.9 08/16/2019   Lab Results  Component Value Date   HGBA1C 5.4 08/17/2019   HGBA1C 5.3 12/17/2018   Lab Results  Component Value Date   INSULIN 20.0 08/17/2019   Lab Results  Component Value Date   TSH 1.32 06/15/2019   Lab Results  Component Value Date   CHOL 111 08/17/2019   HDL 40 08/17/2019   LDLCALC 54 08/17/2019   TRIG 85 08/17/2019   CHOLHDL 3 02/16/2019   Lab Results  Component Value Date   WBC 7.1 08/09/2019   HGB 14.3 08/09/2019   HCT 41.1 08/09/2019   MCV 99.0 08/09/2019   PLT 237.0 08/09/2019   Lab Results  Component Value Date   IRON 73 06/15/2019   TIBC 263 06/15/2019   FERRITIN 166 (H) 06/15/2019   Attestation Statements:   Reviewed by clinician on day of visit: allergies, medications, problem list, medical history, surgical history, family history, social history, and previous encounter notes.   I, Trixie Dredge, am acting as transcriptionist for Coralie Common, MD.  I have reviewed the above documentation for accuracy and completeness, and I agree with the above. - Jinny Blossom,  MD

## 2019-12-10 LAB — COMPREHENSIVE METABOLIC PANEL
ALT: 19 IU/L (ref 0–32)
AST: 16 IU/L (ref 0–40)
Albumin/Globulin Ratio: 1.8 (ref 1.2–2.2)
Albumin: 4.2 g/dL (ref 3.8–4.8)
Alkaline Phosphatase: 113 IU/L (ref 48–121)
BUN/Creatinine Ratio: 18 (ref 12–28)
BUN: 12 mg/dL (ref 8–27)
Bilirubin Total: 0.6 mg/dL (ref 0.0–1.2)
CO2: 27 mmol/L (ref 20–29)
Calcium: 9.2 mg/dL (ref 8.7–10.3)
Chloride: 103 mmol/L (ref 96–106)
Creatinine, Ser: 0.67 mg/dL (ref 0.57–1.00)
GFR calc Af Amer: 107 mL/min/{1.73_m2} (ref >59)
GFR calc non Af Amer: 93 mL/min/{1.73_m2} (ref >59)
Globulin, Total: 2.4 g/dL (ref 1.5–4.5)
Glucose: 100 mg/dL — ABNORMAL HIGH (ref 65–99)
Potassium: 4.3 mmol/L (ref 3.5–5.2)
Sodium: 140 mmol/L (ref 134–144)
Total Protein: 6.6 g/dL (ref 6.0–8.5)

## 2019-12-10 LAB — HEMOGLOBIN A1C
Est. average glucose Bld gHb Est-mCnc: 100 mg/dL
Hgb A1c MFr Bld: 5.1 % (ref 4.8–5.6)

## 2019-12-10 LAB — LIPID PANEL WITH LDL/HDL RATIO
Cholesterol, Total: 119 mg/dL (ref 100–199)
HDL: 40 mg/dL (ref >39)
LDL Chol Calc (NIH): 65 mg/dL (ref 0–99)
LDL/HDL Ratio: 1.6 ratio (ref 0.0–3.2)
Triglycerides: 63 mg/dL (ref 0–149)
VLDL Cholesterol Cal: 14 mg/dL (ref 5–40)

## 2019-12-10 LAB — INSULIN, RANDOM: INSULIN: 12.7 u[IU]/mL (ref 2.6–24.9)

## 2019-12-10 LAB — VITAMIN D 25 HYDROXY (VIT D DEFICIENCY, FRACTURES): Vit D, 25-Hydroxy: 54.9 ng/mL (ref 30.0–100.0)

## 2019-12-13 ENCOUNTER — Telehealth (INDEPENDENT_AMBULATORY_CARE_PROVIDER_SITE_OTHER): Payer: Self-pay | Admitting: Family Medicine

## 2019-12-13 NOTE — Telephone Encounter (Signed)
Patient called, she is concerned about her sugar level in her last labs.  She wants to know if they need to be repeated.  [phone]

## 2019-12-15 NOTE — Telephone Encounter (Signed)
Please review

## 2019-12-22 ENCOUNTER — Other Ambulatory Visit: Payer: Managed Care, Other (non HMO)

## 2019-12-22 ENCOUNTER — Other Ambulatory Visit: Payer: Self-pay

## 2019-12-22 DIAGNOSIS — Z20822 Contact with and (suspected) exposure to covid-19: Secondary | ICD-10-CM

## 2019-12-24 LAB — NOVEL CORONAVIRUS, NAA: SARS-CoV-2, NAA: NOT DETECTED

## 2019-12-24 LAB — SARS-COV-2, NAA 2 DAY TAT

## 2019-12-28 ENCOUNTER — Ambulatory Visit (INDEPENDENT_AMBULATORY_CARE_PROVIDER_SITE_OTHER): Payer: Managed Care, Other (non HMO) | Admitting: Family Medicine

## 2020-01-06 ENCOUNTER — Other Ambulatory Visit: Payer: Self-pay | Admitting: Family Medicine

## 2020-02-22 NOTE — Progress Notes (Signed)
Cardiology Office Note:    Date:  02/22/2020   ID:  Kayla Rosales, DOB [birth year], MRN [id number]  PCP:  Ma Hillock, DO  Cardiologist:  Elouise Munroe, MD  Electrophysiologist:  None   Referring MD: Ma Hillock, DO   Chief Complaint/Reason for Referral: Follow up; palpitations, murmur  History of Present Illness:    Kayla Rosales is a 64 y.o. female with a history of stroke in 2017 while on HRT due to early menopause, with no residual deficits, mild OSA with possible plan for septorhinoplasty, improved with weight loss, heart murmur with no valve disease on echo, and palpitations with infrequent SVT and ectopy, not requiring treatment. She presents for follow up.   She is overall feeling well, but is caring for her elderly father who has had some health setbacks. She has been following with [facility] and has lost weight, which has given her more energy. She lost 20 lbs, and has tried to make dietary changes.   A1C 5.1 %, LDL 65, triglycerides 63, HDL 40. We have been using atorvastatin 20 mg daily for best ASCVD risk reduction, she has done well on this dose.   No worrisome palpitations. She denies chest pain, chest pressure, dyspnea at rest or with exertion, PND, orthopnea, or leg swelling. Denies cough, fever, chills. Denies nausea, vomiting. Denies syncope or presyncope. Denies dizziness or lightheadedness.   Past Medical History:  Diagnosis Date  . Allergy   . Arthritis   . Asthma   . Back pain   . Benign positional vertigo   . Chicken pox   . Colon polyps   . Diverticulitis   . Early menopause occurring in patient age younger than 33 years   . Fibromyalgia   . GERD (gastroesophageal reflux disease)   . Gluten intolerance   . 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 IBS (irritable bowel syndrome)   . Internal hemorrhoids 05/2018  . Joint pain   . Lumbar herniated disc   . Night sweats   . Palpitations   . Sleep apnea   . Stroke  (cerebrum) ([place]) 2017   right UE/neck sx, resolved.   . Vitamin B 12 deficiency   . Vitamin D deficiency     Past Surgical History:  Procedure Laterality Date  . COLONOSCOPY  2015   [name]- [place], [place]; 5 yr repeat recommended  . COLONOSCOPY W/ POLYPECTOMY  05/26/2018   x5 polyps  . KNEE ARTHROSCOPY Bilateral 2013   meniscus  x2  . NECK SURGERY  2018   disc fusion  . UPPER GASTROINTESTINAL ENDOSCOPY      Current Medications: Current Meds  Medication Sig  . aspirin EC 81 MG tablet Take 81 mg by mouth daily.  . Cholecalciferol 25 MCG (1000 UT) capsule Take 1,000 Units by mouth daily.  . cyanocobalamin 1000 MCG tablet Take 1,000 mcg by mouth daily.  Marland Kitchen esomeprazole (NEXIUM) 40 MG capsule Take 1 capsule (40 mg total) by mouth daily at 12 noon.  . Magnesium 250 MG TABS Take 1 tablet by mouth daily.  . Multiple Vitamins-Minerals (ALIVE WOMENS 50+ PO) Take by mouth daily.  . polyethylene glycol (MIRALAX / GLYCOLAX) 17 g packet Take 17 g by mouth daily as needed.  . Probiotic Product (PROBIOTIC ADVANCED) CAPS daily.   . Wheat Dextrin (BENEFIBER) POWD Take 5 mg by mouth daily.  . [DISCONTINUED] atorvastatin (LIPITOR) 20 MG tablet Take 1 tablet (20 mg total) by mouth daily.  Allergies:   Hydromorphone, Meperidine, and Sulfa antibiotics   Social History   Tobacco Use  . Smoking status: Never Smoker  . Smokeless tobacco: Never Used  Vaping Use  . Vaping Use: Never used  Substance Use Topics  . Alcohol use: Yes    Alcohol/week: 1.0 standard drink    Types: 1 Glasses of wine per week    Comment: 1/2-1 per day  . Drug use: Never     Family History: The patient's family history includes Anxiety disorder in her mother; Arthritis in her father; Breast cancer in her cousin; COPD in her sister; Depression in her mother; Depression (age of onset: 63) in her sister; Diabetes in her maternal grandmother; Drug abuse in her sister; Early death in her maternal grandfather, maternal  grandmother, and sister; Early death (age of onset: 21) in her mother; Heart disease in her father, maternal grandfather, and mother; Hypertension in her mother; Lung cancer in her sister; Mental illness in her sister; Obesity in her mother; Thyroid disease in her mother.  ROS:   Please see the history of present illness.    All other systems reviewed and are negative.  EKGs/Labs/Other Studies Reviewed:    The following studies were reviewed today:  EKG:  NSR  Recent Labs: 06/15/2019: Magnesium 1.9; TSH 1.32 08/09/2019: Hemoglobin 14.3; Platelets 237.0 12/09/2019: ALT 19; BUN 12; Creatinine, Ser 0.67; Potassium 4.3; Sodium 140  Recent Lipid Panel    Component Value Date/Time   CHOL 119 12/09/2019 0754   TRIG 63 12/09/2019 0754   HDL 40 12/09/2019 0754   CHOLHDL 3 02/16/2019 1009   VLDL 24.2 02/16/2019 1009   LDLCALC 65 12/09/2019 0754    Physical Exam:    VS:  BP 110/70   Pulse 62   Ht 5\' 4"  (1.626 m)   Wt 163 lb (73.9 kg)   SpO2 97%   BMI 27.98 kg/m     Wt Readings from Last 5 Encounters:  12/09/19 158 lb (71.7 kg)  11/17/19 159 lb (72.1 kg)  10/27/19 161 lb (73 kg)  10/05/19 165 lb (74.8 kg)  09/20/19 166 lb (75.3 kg)    Constitutional: No acute distress Eyes: sclera non-icteric, normal conjunctiva and lids ENMT: normal dentition, moist mucous membranes Cardiovascular: regular rhythm, normal rate, no murmurs. S1 and S2 normal. Radial pulses normal bilaterally. No jugular venous distention.  Respiratory: clear to auscultation bilaterally GI : normal bowel sounds, soft and nontender. No distention.   MSK: extremities warm, well perfused. No edema.  NEURO: grossly nonfocal exam, moves all extremities. PSYCH: alert and oriented x 3, normal mood and affect.   ASSESSMENT:    1. Palpitations   2. History of stroke while on hormone replacement therapy   3. Premature menopause   4. Snoring    PLAN:    Palpitations - Plan: EKG 12-Lead - ECG without abnormality  today. Reviewed monitor results. No change to medication therapy needed, she will observe symptoms.  History of stroke while on hormone replacement therapy - no residual deficit. She is on atorvastatin 20 mg daily, tolerating well. ASA 81 mg daily.   Snoring - mild OSA, no recommendation for appliance at that time. Improved with weight loss.  CV risk reduction - atorvastatin 20 mg daily, asa 81 mg daily.  - consider coronary calcium scoring to intensify statin therapy - she has had some myalgias before, would consider PCSK9I if coronary calcifications are present.   Exercise recommendations: Goal of exercising for at least 30 minutes  a day, at least 5 times per week.  Please exercise to a moderate exertion.  This means that while exercising it is difficult to speak in full sentences, however you are not so short of breath that you feel you must stop, and not so comfortable that you can carry on a full conversation.  Exertion level should be approximately a 5/10, if 10 is the most exertion you can perform.  Diet recommendations: Recommend a heart healthy diet such as the Mediterranean diet.  This diet consists of plant based foods, healthy fats, lean meats, olive oil.  It suggests limiting the intake of simple carbohydrates such as white breads, pastries, and pastas.  It also limits the amount of red meat, wine, and dairy products such as cheese that one should consume on a daily basis.   Total time of encounter: 30 minutes total time of encounter, including 20 minutes spent in face-to-face patient care on the date of this encounter. This time includes coordination of care and counseling regarding above mentioned problem list. Remainder of non-face-to-face time involved reviewing chart documents/testing relevant to the patient encounter and documentation in the medical record. I have independently reviewed documentation from referring provider.   Cherlynn Kaiser, MD   [facility]     Medication Adjustments/Labs and Tests Ordered: Current medicines are reviewed at length with the patient today.  Concerns regarding medicines are outlined above.   No orders of the defined types were placed in this encounter.   No orders of the defined types were placed in this encounter.   There are no Patient Instructions on file for this visit.

## 2020-02-23 ENCOUNTER — Encounter: Payer: Self-pay | Admitting: Internal Medicine

## 2020-02-23 ENCOUNTER — Other Ambulatory Visit: Payer: Self-pay

## 2020-02-23 ENCOUNTER — Ambulatory Visit (INDEPENDENT_AMBULATORY_CARE_PROVIDER_SITE_OTHER): Payer: Managed Care, Other (non HMO) | Admitting: Internal Medicine

## 2020-02-23 VITALS — BP 110/70 | HR 62 | Ht 64.0 in | Wt 163.0 lb

## 2020-02-23 DIAGNOSIS — E28319 Asymptomatic premature menopause: Secondary | ICD-10-CM

## 2020-02-23 DIAGNOSIS — R002 Palpitations: Secondary | ICD-10-CM

## 2020-02-23 DIAGNOSIS — Z8673 Personal history of transient ischemic attack (TIA), and cerebral infarction without residual deficits: Secondary | ICD-10-CM

## 2020-02-23 DIAGNOSIS — R0683 Snoring: Secondary | ICD-10-CM

## 2020-02-23 NOTE — Patient Instructions (Signed)

## 2020-03-13 ENCOUNTER — Ambulatory Visit: Payer: Managed Care, Other (non HMO) | Admitting: Pulmonary Disease

## 2020-03-20 ENCOUNTER — Encounter: Payer: Managed Care, Other (non HMO) | Admitting: Family Medicine

## 2020-03-22 ENCOUNTER — Ambulatory Visit (INDEPENDENT_AMBULATORY_CARE_PROVIDER_SITE_OTHER): Payer: Managed Care, Other (non HMO) | Admitting: Obstetrics & Gynecology

## 2020-03-22 ENCOUNTER — Encounter: Payer: Self-pay | Admitting: Obstetrics & Gynecology

## 2020-03-22 ENCOUNTER — Other Ambulatory Visit: Payer: Self-pay

## 2020-03-22 VITALS — BP 122/80 | Ht 64.0 in | Wt 165.0 lb

## 2020-03-22 DIAGNOSIS — Z01419 Encounter for gynecological examination (general) (routine) without abnormal findings: Secondary | ICD-10-CM | POA: Diagnosis not present

## 2020-03-22 DIAGNOSIS — Z780 Asymptomatic menopausal state: Secondary | ICD-10-CM

## 2020-03-22 DIAGNOSIS — M8589 Other specified disorders of bone density and structure, multiple sites: Secondary | ICD-10-CM | POA: Diagnosis not present

## 2020-03-22 NOTE — Progress Notes (Signed)
Kayla Rosales 1955-09-14 [id number]   History:    64 y.o. G4P3A1L3 Remarried x 6 years. Husband has 5 sons. Many grand-children both sides.  ME:[id number] presenting for annual gyn exam   JHE:RDEYC menopause in the 30s,on no HRT.  No PMB. Had a stroke on HRT 3 yrs ago and stopped hormones since then. Good recovery from stroke. No pelvic pain. No pain with intercourse. Urine normal except for SUI. Difficulty passing stools, seen by Gertie Fey. Breasts normal. Body mass index improved at 28.32.  Better diet, will increase physical activity. Health labs with family physician.  Past medical history,surgical history, family history and social history were all reviewed and documented in the EPIC chart.  Gynecologic History No LMP recorded. Patient is postmenopausal.  Obstetric History OB History  Gravida Para Term Preterm AB Living  4 3   1 1 3   SAB TAB Ectopic Multiple Live Births      0        # Outcome Date GA Lbr Len/2nd Weight Sex Delivery Anes PTL Lv  4 AB           3 Para           2 Para           1 Preterm              ROS: A ROS was performed and pertinent positives and negatives are included in the history.  GENERAL: No fevers or chills. HEENT: No change in vision, no earache, sore throat or sinus congestion. NECK: No pain or stiffness. CARDIOVASCULAR: No chest pain or pressure. No palpitations. PULMONARY: No shortness of breath, cough or wheeze. GASTROINTESTINAL: No abdominal pain, nausea, vomiting or diarrhea, melena or bright red blood per rectum. GENITOURINARY: No urinary frequency, urgency, hesitancy or dysuria. MUSCULOSKELETAL: No joint or muscle pain, no back pain, no recent trauma. DERMATOLOGIC: No rash, no itching, no lesions. ENDOCRINE: No polyuria, polydipsia, no heat or cold intolerance. No recent change in weight. HEMATOLOGICAL: No anemia or easy bruising or bleeding. NEUROLOGIC: No headache, seizures, numbness, tingling or weakness. PSYCHIATRIC:  No depression, no loss of interest in normal activity or change in sleep pattern.     Exam:   BP 122/80 (BP Location: Right Arm, Patient Position: Sitting, Cuff Size: Normal)   Ht 5\' 4"  (1.626 m)   Wt 165 lb (74.8 kg)   BMI 28.32 kg/m   Body mass index is 28.32 kg/m.  General appearance : Well developed well nourished female. No acute distress HEENT: Eyes: no retinal hemorrhage or exudates,  Neck supple, trachea midline, no carotid bruits, no thyroidmegaly Lungs: Clear to auscultation, no rhonchi or wheezes, or rib retractions  Heart: Regular rate and rhythm, no murmurs or gallops Breast:Examined in sitting and supine position were symmetrical in appearance, no palpable masses or tenderness,  no skin retraction, no nipple inversion, no nipple discharge, no skin discoloration, no axillary or supraclavicular lymphadenopathy Abdomen: no palpable masses or tenderness, no rebound or guarding Extremities: no edema or skin discoloration or tenderness  Pelvic: Vulva: Normal             Vagina: No gross lesions or discharge  Cervix: No gross lesions or discharge  Uterus  AV, normal size, shape and consistency, non-tender and mobile  Adnexa  Without masses or tenderness  Anus: Normal   Assessment/Plan:  64 y.o. female for annual exam   1. Well female exam with routine gynecological exam Normal gynecologic exam in menopause.  Pap  test negative in 2020, will repeat a Pap test at 2 years next year.  Breast exam normal.  Screening mammogram January 2021 was negative.  Will schedule screening colonoscopy January 2022.  Health labs with family physician.  Improved body mass index at 28.32.  Continue with healthy nutrition and increase physical activity to aerobic activities 5 times a week and light weightlifting every 2 days.  2. Postmenopausal Well on no hormone replacement therapy.  No postmenopausal bleeding.  3. Osteopenia of multiple sites Last bone density November 2019 showed  osteopenia.  We will repeat a bone density here now.  Vitamin D supplements, calcium intake of 1500 mg daily and regular weightbearing physical activity is recommended. - DG Bone Density; Future  Princess Bruins MD, 9:16 AM 03/22/2020

## 2020-03-31 ENCOUNTER — Encounter: Payer: Managed Care, Other (non HMO) | Admitting: Family Medicine

## 2020-04-03 ENCOUNTER — Ambulatory Visit (INDEPENDENT_AMBULATORY_CARE_PROVIDER_SITE_OTHER): Payer: Managed Care, Other (non HMO) | Admitting: Family Medicine

## 2020-04-03 ENCOUNTER — Encounter: Payer: Self-pay | Admitting: Family Medicine

## 2020-04-03 ENCOUNTER — Other Ambulatory Visit: Payer: Self-pay

## 2020-04-03 VITALS — BP 114/69 | HR 60 | Temp 98.2°F | Ht 64.0 in | Wt 164.0 lb

## 2020-04-03 DIAGNOSIS — Z23 Encounter for immunization: Secondary | ICD-10-CM

## 2020-04-03 DIAGNOSIS — Z803 Family history of malignant neoplasm of breast: Secondary | ICD-10-CM

## 2020-04-03 DIAGNOSIS — M8589 Other specified disorders of bone density and structure, multiple sites: Secondary | ICD-10-CM | POA: Diagnosis not present

## 2020-04-03 DIAGNOSIS — E785 Hyperlipidemia, unspecified: Secondary | ICD-10-CM | POA: Diagnosis not present

## 2020-04-03 DIAGNOSIS — Z0000 Encounter for general adult medical examination without abnormal findings: Secondary | ICD-10-CM

## 2020-04-03 DIAGNOSIS — Z1231 Encounter for screening mammogram for malignant neoplasm of breast: Secondary | ICD-10-CM | POA: Diagnosis not present

## 2020-04-03 DIAGNOSIS — D7589 Other specified diseases of blood and blood-forming organs: Secondary | ICD-10-CM

## 2020-04-03 DIAGNOSIS — Z86010 Personal history of colonic polyps: Secondary | ICD-10-CM | POA: Diagnosis not present

## 2020-04-03 LAB — CBC
HCT: 43.1 % (ref 36.0–46.0)
Hemoglobin: 14.7 g/dL (ref 12.0–15.0)
MCHC: 34 g/dL (ref 30.0–36.0)
MCV: 101 fl — ABNORMAL HIGH (ref 78.0–100.0)
Platelets: 216 10*3/uL (ref 150.0–400.0)
RBC: 4.27 Mil/uL (ref 3.87–5.11)
RDW: 12.8 % (ref 11.5–15.5)
WBC: 4.4 10*3/uL (ref 4.0–10.5)

## 2020-04-03 LAB — TSH: TSH: 1.15 u[IU]/mL (ref 0.35–4.50)

## 2020-04-03 MED ORDER — ATORVASTATIN CALCIUM 20 MG PO TABS
20.0000 mg | ORAL_TABLET | Freq: Every day | ORAL | 3 refills | Status: DC
Start: 2020-04-03 — End: 2021-03-06

## 2020-04-03 NOTE — Progress Notes (Signed)
This visit occurred during the SARS-CoV-2 public health emergency.  Safety protocols were in place, including screening questions prior to the visit, additional usage of staff PPE, and extensive cleaning of exam room while observing appropriate contact time as indicated for disinfecting solutions.    Patient ID: Kayla Rosales, female  DOB: 1955-07-09, 64 y.o.   MRN: [id number] Patient Care Team    Relationship Specialty Notifications Start End  Ma Hillock, DO PCP - General Family Medicine  01/15/18   Elouise Munroe, MD PCP - Cardiology Cardiology Admissions 07/09/19   Gatha Mayer, MD Consulting Physician Gastroenterology  01/19/18   Princess Bruins, MD Consulting Physician Obstetrics and Gynecology  04/03/20     Chief Complaint  Patient presents with  . Annual Exam    CPE;    Subjective:  Kayla Rosales is a 64 y.o.  Female  present for CPE . All past medical history, surgical history, allergies, family history, immunizations, medications and social history were updated in the electronic medical record today. All recent labs, ED visits and hospitalizations within the last year were reviewed.  Health maintenance:  Colonoscopy: h/o colon polyps.completed 05/26/2018, by Dr. Carlean Purl. follow up 3 yr. Mammogram: completed:04/21/2019, breast center [facility]-ordered by gyn Cervical cancer screening: last pap:03/2019- GYN Immunizations: tdap UTD 01/2016, Influenza UTD 03/2020(encouraged yearly), PNA series -prevnar started today, shingrix completed, covid moderna series completed w/ booster.  Infectious disease screening: HIV and Hep C completed DEXA: last completed 03/2018-osteopenia- ordered by gyn Assistive device: none Oxygen MHD:QQIW Patient has a Dental home. Hospitalizations/ED visits: reviewed  Hyperlipidemia/h/o stroke/murmur:  Patient suffered from a stroke in 2017.  Since that time she has been on atorvastatin 20 mg qd. She is compliant with medication. Reviewed recent labs  11/2019  Depression screen [facility] 2/9 04/03/2020 08/17/2019 05/20/2019 01/15/2018  Decreased Interest 0 1 0 0  Down, Depressed, Hopeless 0 1 0 0  PHQ - 2 Score 0 2 0 0  Altered sleeping 3 3 - -  Tired, decreased energy 3 3 - -  Change in appetite 0 1 - -  Feeling bad or failure about yourself  0 1 - -  Trouble concentrating 0 1 - -  Moving slowly or fidgety/restless 0 2 - -  Suicidal thoughts 0 0 - -  PHQ-9 Score 6 13 - -  Difficult doing work/chores - Somewhat difficult - -   No flowsheet data found.    Immunization History  Administered Date(s) Administered  . Fluad Quad(high Dose 65+) 03/27/2020  . Influenza Inj Mdck Quad Pf 03/27/2017  . Influenza, High Dose Seasonal PF 01/15/2018  . Influenza, Quadrivalent, Recombinant, Inj, Pf 02/16/2019  . Influenza, Seasonal, Injecte, Preservative Fre 03/27/2017  . Influenza,inj,Quad PF,6+ Mos 02/16/2019  . Influenza-Unspecified 01/21/2016, 02/16/2019  . Moderna Sars-Covid-2 Vaccination 06/10/2019, 07/09/2019, 03/02/2020  . Pneumococcal Conjugate-13 04/03/2020  . Pneumococcal Polysaccharide-23 08/13/2015  . Tdap 12/30/2011, 01/21/2016  . Zoster Recombinat (Shingrix) 02/16/2019, 04/22/2019     Past Medical History:  Diagnosis Date  . Allergy   . Arthritis   . Asthma   . Back pain   . Benign positional vertigo   . Chicken pox   . Colon polyps   . Diverticulitis   . Early menopause occurring in patient age younger than 58 years   . Fibromyalgia   . GERD (gastroesophageal reflux disease)   . Gluten intolerance   . 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 IBS (irritable bowel syndrome)   . Internal hemorrhoids 05/2018  .  Joint pain   . Lumbar herniated disc   . Night sweats   . Palpitations   . Right arm weakness 09/28/2015   Last Assessment & Plan:  Formatting of this note might be different from the original. Referral to PT today. Suspect residual after stroke in left basal ganglia  . Sleep apnea   . Snoring 01/04/2019  .  Stroke (cerebrum) ([place]) 2017   right UE/neck sx, resolved.   . Vertigo 08/11/2015   Last Assessment & Plan:  Formatting of this note might be different from the original. Concern given gradual onset over the last week and a half in addition to neck pain. Patient needs neuroimaging to rule out dissecting vertebral aneurysm. Patient advised to go to emergency room. She declined ambulance transport  . Vitamin B 12 deficiency   . Vitamin D deficiency    Allergies  Allergen Reactions  . Hydromorphone Other (See Comments)    HYPOTENSION  . Meperidine Other (See Comments)    "BP drops"  . Sulfa Antibiotics Other (See Comments)    States she does not know the reaction    Past Surgical History:  Procedure Laterality Date  . COLONOSCOPY  2015   [name]- [place], [place]; 5 yr repeat recommended  . COLONOSCOPY W/ POLYPECTOMY  05/26/2018   x5 polyps  . KNEE ARTHROSCOPY Bilateral 2013   meniscus  x2  . NECK SURGERY  2018   disc fusion  . UPPER GASTROINTESTINAL ENDOSCOPY     Family History  Problem Relation Age of Onset  . Depression Mother   . Heart disease Mother   . Early death Mother 72  . Hypertension Mother   . Thyroid disease Mother   . Anxiety disorder Mother   . Obesity Mother   . Arthritis Father   . Heart disease Father   . Depression Sister 94  . Early death Sister   . Drug abuse Sister   . Lung cancer Sister   . Mental illness Sister   . COPD Sister   . Diabetes Maternal Grandmother   . Early death Maternal Grandmother   . Early death Maternal Grandfather   . Heart disease Maternal Grandfather   . Breast cancer Cousin    Social History   Social History Narrative   Married second family - 26 kids 53 grandkids blended   Haematologist, retired Pharmacist, hospital   Caffeine 2/day   Smoker - never   EtOH - rare   No drugs   Wears her seatbelt, smoke alarm at home.   Feels safe in her relationships.    Allergies as of 04/03/2020      Reactions   Hydromorphone Other  (See Comments)   HYPOTENSION   Meperidine Other (See Comments)   "BP drops"   Sulfa Antibiotics Other (See Comments)   States she does not know the reaction      Medication List       Accurate as of April 03, 2020 11:27 AM. If you have any questions, ask your nurse or doctor.        STOP taking these medications   gabapentin 300 MG capsule Commonly known as: NEURONTIN Stopped by: Howard Pouch, DO   ibuprofen 800 MG tablet Commonly known as: ADVIL Stopped by: Howard Pouch, DO   predniSONE 5 MG (21) Tbpk tablet Commonly known as: STERAPRED UNI-PAK 21 TAB Stopped by: Howard Pouch, DO     TAKE these medications   ALIVE WOMENS 50+ PO Take by mouth daily.  aspirin EC 81 MG tablet Take 81 mg by mouth daily.   atorvastatin 20 MG tablet Commonly known as: LIPITOR Take 1 tablet (20 mg total) by mouth daily.   Benefiber Powd Take 5 mg by mouth daily.   Cholecalciferol 25 MCG (1000 UT) capsule Take 1,000 Units by mouth daily.   cyanocobalamin 1000 MCG tablet Take 1,000 mcg by mouth daily.   esomeprazole 40 MG capsule Commonly known as: NEXIUM Take 1 capsule (40 mg total) by mouth daily at 12 noon.   Magnesium 250 MG Tabs Take 1 tablet by mouth daily.   polyethylene glycol 17 g packet Commonly known as: MIRALAX / GLYCOLAX Take 17 g by mouth daily as needed.   Probiotic Advanced Caps daily.       All past medical history, surgical history, allergies, family history, immunizations andmedications were updated in the EMR today and reviewed under the history and medication portions of their EMR.     No results found for this or any previous visit (from the past 2160 hour(s)).   ROS: 14 pt review of systems performed and negative (unless mentioned in an HPI)  Objective: BP 114/69   Pulse 60   Temp 98.2 F (36.8 C) (Oral)   Ht 5\' 4"  (1.626 m)   Wt 164 lb (74.4 kg)   SpO2 99%   BMI 28.15 kg/m  Gen: Afebrile. No acute distress. Nontoxic in appearance,  well-developed, well-nourished,  Pleasant female.  HENT: AT. [place]. Bilateral TM visualized and normal in appearance, normal external auditory canal. MMM, no oral lesions, adequate dentition. Bilateral nares within normal limits. Throat without erythema, ulcerations or exudates. no Cough on exam, no hoarseness on exam. Eyes:Pupils Equal Round Reactive to light, Extraocular movements intact,  Conjunctiva without redness, discharge or icterus. Neck/lymp/endocrine: Supple,no lymphadenopathy, no thyromegaly CV: RRR no murmur, no edema, +2/4 P posterior tibialis pulses.  Chest: CTAB, no wheeze, rhonchi or crackles. normal Respiratory effort. good Air movement. Abd: Soft. flat. NTND. BS present. no Masses palpated. No hepatosplenomegaly. No rebound tenderness or guarding. Skin: no rashes, purpura or petechiae. Warm and well-perfused. Skin intact. Neuro/Msk:  Normal gait. PERLA. EOMi. Alert. Oriented x3.  Cranial nerves II through XII intact. Muscle strength 5/5 upper/lower extremity. DTRs equal bilaterally. Psych: Normal affect, dress and demeanor. Normal speech. Normal thought content and judgment.   No exam data present  Assessment/plan: Kayla Rosales is a 64 y.o. female present for CPE  History of colonic polyp Due 05/2021  Osteopenia of multiple sites Scheduled dexa by gyn this week.   Family history of malignant neoplasm of breast/Encounter for screening mammogram for malignant neoplasm of breast - MM 3D SCREEN BREAST BILATERAL; Future  Macrocytosis without anemia - CBC  Hyperlipidemia LDL goal <100 Continue atrovastatin 20 mg qd. Diet and exercise.  - TSH Lipid panel at goal  Need for pneumococcal vaccination prevnar completed today   Routine general medical examination at a health care facility Patient was encouraged to exercise greater than 150 minutes a week. Patient was encouraged to choose a diet filled with fresh fruits and vegetables, and lean meats. AVS provided to patient  today for education/recommendation on gender specific health and safety maintenance. Colonoscopy: h/o colon polyps.completed 05/26/2018, by Dr. Carlean Purl. follow up 3 yr. Mammogram: completed:04/21/2019, breast center [facility]-ordered by gyn Cervical cancer screening: last pap:03/2019- GYN Immunizations: tdap UTD 01/2016, Influenza UTD 03/2020(encouraged yearly), PNA series -prevnar started today, shingrix completed, covid moderna series completed w/ booster.  Infectious disease screening: HIV and Hep C  completed DEXA: last completed 03/2018-osteopenia- ordered by gyn   * discussed PHQ reading- pt reports she is fine and just needs a break.   Return in about 1 year (around 04/03/2021) for CPE (30 min).    Orders Placed This Encounter  Procedures  . MM 3D SCREEN BREAST BILATERAL  . Pneumococcal conjugate vaccine 13-valent IM  . TSH  . CBC   Meds ordered this encounter  Medications  . atorvastatin (LIPITOR) 20 MG tablet    Sig: Take 1 tablet (20 mg total) by mouth daily.    Dispense:  90 tablet    Refill:  3   Referral Orders  No referral(s) requested today     Electronically signed by: Howard Pouch, [place]

## 2020-04-03 NOTE — Patient Instructions (Signed)

## 2020-04-25 ENCOUNTER — Other Ambulatory Visit: Payer: Self-pay

## 2020-04-25 ENCOUNTER — Encounter: Payer: Self-pay | Admitting: Pulmonary Disease

## 2020-04-25 ENCOUNTER — Ambulatory Visit (INDEPENDENT_AMBULATORY_CARE_PROVIDER_SITE_OTHER): Payer: Medicare Other | Admitting: Pulmonary Disease

## 2020-04-25 VITALS — BP 120/80 | HR 69 | Temp 97.5°F | Ht 65.0 in | Wt 163.4 lb

## 2020-04-25 DIAGNOSIS — G4733 Obstructive sleep apnea (adult) (pediatric): Secondary | ICD-10-CM | POA: Diagnosis not present

## 2020-04-25 DIAGNOSIS — R634 Abnormal weight loss: Secondary | ICD-10-CM | POA: Diagnosis not present

## 2020-04-25 NOTE — Patient Instructions (Addendum)
Will arrange for home sleep study Will call to arrange for follow up after sleep study reviewed  

## 2020-04-25 NOTE — Progress Notes (Signed)
[facility]  Chief Complaint  Patient presents with  . Follow-up    Not sleeping well r/t family stress, lost 20lbs since last visit    Constitutional:  BP 120/80 (BP Location: Right Arm, Cuff Size: Normal)   Pulse 69   Temp (!) 97.5 F (36.4 C)   Ht 5\' 5"  (1.651 m)   Wt 163 lb 6.4 oz (74.1 kg)   SpO2 98%   BMI 27.19 kg/m   Past Medical History:  Stroke, IBS, GERD, Diverticulitis, Colon polyps, Chicken pox, Asthma, OA, Allergies  Past Surgical History:  She  has a past surgical history that includes Neck surgery (2018); Colonoscopy (2015); Upper gastrointestinal endoscopy; Knee arthroscopy (Bilateral, 2013); and Colonoscopy w/ polypectomy (05/26/2018).  Brief Summary:  Kayla Rosales is a 65 y.o. female with obstructive sleep apnea     Subjective:   She had home sleep study in January 2021.  Showed mild sleep apnea.  She was referred to ENT and plastic surgery to assess for nasal valve surgery, but opted against this.  She worked with medical weight loss clinic >> BMI from 31 down to 27 now.  She still snores.  Not having apnea episodes anymore.    Her father had dementia and was in a nursing home.  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  He passed away earlier this month.  She has been under a lot of emotional strain.  As a result she hasn't been able to get good sleep.  Physical Exam:   Appearance - well kempt   ENMT - no sinus tenderness, no oral exudate, no LAN, Mallampati 4 airway, no stridor  Respiratory - equal breath sounds bilaterally, no wheezing or rales  CV - s1s2 regular rate and rhythm, no murmurs  Ext - no clubbing, no edema  Skin - no rashes  Psych - normal mood and affect   Sleep Tests:   HST 05/03/19 >> AHI 6.5, SpO2 low 82%  Cardiac Tests:   Echo 07/30/19 >> EF 60 to 65%, grade 1 DD  Social History:  She  reports that she has never smoked. She has never used smokeless tobacco. She reports  current alcohol use of about 1.0 standard drink of alcohol per week. She reports that she does not use drugs.  Family History:  Her family history includes Anxiety disorder in her mother; Arthritis in her father; Breast cancer in her cousin; COPD in her sister; Depression in her mother; Depression (age of onset: 66) in her sister; Diabetes in her maternal grandmother; Drug abuse in her sister; Early death in her maternal grandfather, maternal grandmother, and sister; Early death (age of onset: 72) in her mother; Heart disease in her father, maternal grandfather, and mother; Hypertension in her mother; Lung cancer in her sister; Mental illness in her sister; Obesity in her mother; Thyroid disease in her mother.     Assessment/Plan:   Obstructive sleep apnea. - home sleep study showed mild sleep apnea - symptoms improved with weight loss - will repeat home sleep study to determine if she still has sleep apnea; given her history of stroke it would be important to consider therapy if she still has sleep apnea  Grief reaction. - much of her sleep issues could be related to stress from managing her father's care and from his recent passing - will reassess her sleep at next follow up after she has repeat home sleep study  Time Spent Involved in Patient Care on  Day of Examination:  23 minutes  Follow up:  Patient Instructions  Will arrange for home sleep study Will call to arrange for follow up after sleep study reviewed   Medication List:   Allergies as of 04/25/2020      Reactions   Hydromorphone Other (See Comments)   HYPOTENSION   Meperidine Other (See Comments)   "BP drops"   Sulfa Antibiotics Other (See Comments)   States she does not know the reaction      Medication List       Accurate as of April 25, 2020 11:19 AM. If you have any questions, ask your nurse or doctor.        ALIVE WOMENS 50+ PO Take by mouth daily.   aspirin EC 81 MG tablet Take 81 mg by mouth  daily.   atorvastatin 20 MG tablet Commonly known as: LIPITOR Take 1 tablet (20 mg total) by mouth daily.   Benefiber Powd Take 5 mg by mouth daily.   Cholecalciferol 25 MCG (1000 UT) capsule Take 1,000 Units by mouth daily.   cyanocobalamin 1000 MCG tablet Take 1,000 mcg by mouth daily.   esomeprazole 40 MG capsule Commonly known as: NEXIUM Take 1 capsule (40 mg total) by mouth daily at 12 noon.   Magnesium 250 MG Tabs Take 1 tablet by mouth daily.   polyethylene glycol 17 g packet Commonly known as: MIRALAX / GLYCOLAX Take 17 g by mouth daily as needed.   Probiotic Advanced Caps daily.       Signature:  Chesley Mires, MD [place] Pager - [phone] 04/25/2020, 11:19 AM

## 2020-04-27 ENCOUNTER — Ambulatory Visit: Payer: Medicare Other | Admitting: Family Medicine

## 2020-05-12 ENCOUNTER — Telehealth: Payer: Self-pay | Admitting: Pulmonary Disease

## 2020-05-12 NOTE — Telephone Encounter (Signed)
Left message for the patient

## 2020-05-12 NOTE — Telephone Encounter (Signed)
Pt has been moved to 10:30

## 2020-05-15 ENCOUNTER — Encounter: Payer: Self-pay | Admitting: Family Medicine

## 2020-05-15 ENCOUNTER — Ambulatory Visit: Payer: Medicare Other

## 2020-05-15 ENCOUNTER — Other Ambulatory Visit: Payer: Self-pay

## 2020-05-15 DIAGNOSIS — G4733 Obstructive sleep apnea (adult) (pediatric): Secondary | ICD-10-CM

## 2020-05-15 DIAGNOSIS — R634 Abnormal weight loss: Secondary | ICD-10-CM

## 2020-05-16 ENCOUNTER — Encounter: Payer: Self-pay | Admitting: Family Medicine

## 2020-05-16 ENCOUNTER — Telehealth (INDEPENDENT_AMBULATORY_CARE_PROVIDER_SITE_OTHER): Payer: Medicare Other | Admitting: Family Medicine

## 2020-05-16 DIAGNOSIS — B9689 Other specified bacterial agents as the cause of diseases classified elsewhere: Secondary | ICD-10-CM | POA: Diagnosis not present

## 2020-05-16 DIAGNOSIS — J329 Chronic sinusitis, unspecified: Secondary | ICD-10-CM

## 2020-05-16 MED ORDER — PREDNISONE 50 MG PO TABS: 50.0000 mg | ORAL_TABLET | Freq: Every day | ORAL | 0 refills | Status: DC

## 2020-05-16 MED ORDER — BENZONATATE 200 MG PO CAPS: 200.0000 mg | ORAL_CAPSULE | Freq: Three times a day (TID) | ORAL | 0 refills | Status: DC | PRN

## 2020-05-16 MED ORDER — DOXYCYCLINE HYCLATE 100 MG PO TABS: 100.0000 mg | ORAL_TABLET | Freq: Two times a day (BID) | ORAL | 0 refills | Status: DC

## 2020-05-16 NOTE — Progress Notes (Signed)
VIRTUAL VISIT VIA VIDEO  I connected with Kayla Rosales on 05/16/20 at  4:00 PM EST by elemedicine application and verified that I am speaking with the correct person using two identifiers. Location patient: Home Location provider: [facility], Office Persons participating in the virtual visit: Patient, Dr. Raoul Pitch and Samul Dada, CMA  I discussed the limitations of evaluation and management by telemedicine and the availability of in person appointments. The patient expressed understanding and agreed to proceed.   SUBJECTIVE Chief Complaint  Patient presents with  . Sinus congestion, cough, scratchy throat     HPI: Kayla Rosales is a 65 y.o. female present for acute illness of sinus congestion, dry cough and scratchy throat of > 10 days duration. She has taken 3 home covid tests and all negative. She reports ymptoms seem to continue to woren over the last 7 days.  JIR:CVELFYBO D Vaccine:Covid series and booster completed., flu shot UTD Exposure: None that she is aware of.  ROS: See pertinent positives and negatives per HPI.  Patient Active Problem List   Diagnosis Date Noted  . Depression 09/20/2019  . Diastolic dysfunction 17/51/0258  . Heart murmur 02/16/2019  . Hyperlipidemia LDL goal <100 02/16/2019  . Class 1 obesity with serious comorbidity and body mass index (BMI) of 30.0 to 30.9 in adult 01/04/2019  . Pelvic floor dysfunction in female 12/06/2018  . Constipation due to outlet dysfunction 12/06/2018  . Lactose intolerance 12/06/2018  . Diverticulosis 05/27/2018  . Osteopenia 01/19/2018  . Irritable bowel syndrome with constipation 01/17/2018  . 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 History of colonic polyp 01/17/2018  . History of stroke 01/15/2018  . GERD (gastroesophageal reflux disease)   . Asthma   . S/P cervical spinal fusion 09/29/2017  . Macrocytosis without anemia 11/25/2015  . 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  . Heart palpitations  09/08/2015  . Neuropathic pain 06/08/2015  . Vitamin B12 deficiency 04/06/2014  . 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 Vestibular neuritis 10/15/2006  . Family history of malignant neoplasm of breast 03/18/2006  . Diaphragmatic hernia 03/18/2006  . Premature menopause 03/18/2006    Social History   Tobacco Use  . Smoking status: Never Smoker  . Smokeless tobacco: Never Used  Substance Use Topics  . Alcohol use: Yes    Alcohol/week: 1.0 standard drink    Types: 1 Glasses of wine per week    Comment: 1/2-1 per day    Current Outpatient Medications:  .  aspirin EC 81 MG tablet, Take 81 mg by mouth daily., Disp: , Rfl:  .  atorvastatin (LIPITOR) 20 MG tablet, Take 1 tablet (20 mg total) by mouth daily., Disp: 90 tablet, Rfl: 3 .  benzonatate (TESSALON) 200 MG capsule, Take 1 capsule (200 mg total) by mouth 3 (three) times daily as needed for cough., Disp: 30 capsule, Rfl: 0 .  Cholecalciferol 25 MCG (1000 UT) capsule, Take 1,000 Units by mouth daily., Disp: , Rfl:  .  cyanocobalamin 1000 MCG tablet, Take 1,000 mcg by mouth daily., Disp: , Rfl:  .  doxycycline (VIBRA-TABS) 100 MG tablet, Take 1 tablet (100 mg total) by mouth 2 (two) times daily., Disp: 20 tablet, Rfl: 0 .  esomeprazole (NEXIUM) 40 MG capsule, Take 1 capsule (40 mg total) by mouth daily at 12 noon., Disp: 90 capsule, Rfl: 1 .  Magnesium 250 MG TABS, Take 1 tablet by mouth daily., Disp: , Rfl:  .  Multiple Vitamins-Minerals (ALIVE WOMENS 50+ PO), Take by mouth daily.,  Disp: , Rfl:  .  polyethylene glycol (MIRALAX / GLYCOLAX) 17 g packet, Take 17 g by mouth daily as needed., Disp: , Rfl:  .  predniSONE (DELTASONE) 50 MG tablet, Take 1 tablet (50 mg total) by mouth daily with breakfast., Disp: 3 tablet, Rfl: 0 .  Probiotic Product (PROBIOTIC ADVANCED) CAPS, daily. , Disp: , Rfl:  .  Wheat Dextrin (BENEFIBER) POWD, Take 5 mg by mouth daily., Disp: , Rfl:   Allergies  Allergen Reactions  . Hydromorphone Other (See  Comments)    HYPOTENSION  . Meperidine Other (See Comments)    "BP drops"  . Sulfa Antibiotics Other (See Comments)    States she does not know the reaction     OBJECTIVE: There were no vitals taken for this visit. Gen: No acute distress. Nontoxic in appearance.  HENT: AT. [place].  MMM.  Eyes:Pupils Equal Round Reactive to light, Extraocular movements intact,  Conjunctiva without redness, discharge or icterus. Chest: Cough present on exam.  Skin: no rashes, purpura or petechiae.  Neuro:  Normal gait. Alert. Oriented x3  Psych: Normal affect and demeanor. Normal speech. Normal thought content and judgment.  ASSESSMENT AND PLAN: Kayla Rosales is a 65 y.o. female present for  Bacterial sinusitis Rest, hydrate.  + flonase, mucinex (DM if cough), nettie pot or nasal saline.  Doxy BID prescribed, take until completed.  Prednisone 50 mg x3 days (gets insomnia- short course only) If cough present it can last up to 6-8 weeks.  F/U 2 weeks if not improved.    Howard Pouch, DO 05/16/2020  No orders of the defined types were placed in this encounter.  Meds ordered this encounter  Medications  . benzonatate (TESSALON) 200 MG capsule    Sig: Take 1 capsule (200 mg total) by mouth 3 (three) times daily as needed for cough.    Dispense:  30 capsule    Refill:  0  . doxycycline (VIBRA-TABS) 100 MG tablet    Sig: Take 1 tablet (100 mg total) by mouth 2 (two) times daily.    Dispense:  20 tablet    Refill:  0  . predniSONE (DELTASONE) 50 MG tablet    Sig: Take 1 tablet (50 mg total) by mouth daily with breakfast.    Dispense:  3 tablet    Refill:  0   Referral Orders  No referral(s) requested today

## 2020-05-17 ENCOUNTER — Other Ambulatory Visit: Payer: Self-pay

## 2020-05-17 ENCOUNTER — Telehealth: Payer: Self-pay | Admitting: Pulmonary Disease

## 2020-05-17 ENCOUNTER — Ambulatory Visit
Admission: RE | Admit: 2020-05-17 | Discharge: 2020-05-17 | Disposition: A | Discharge status: Home / Self Care | Payer: Medicare Other | Source: Ambulatory Visit | Attending: Family Medicine | Admitting: Family Medicine

## 2020-05-17 DIAGNOSIS — G4733 Obstructive sleep apnea (adult) (pediatric): Secondary | ICD-10-CM | POA: Diagnosis not present

## 2020-05-17 DIAGNOSIS — Z803 Family history of malignant neoplasm of breast: Secondary | ICD-10-CM

## 2020-05-17 DIAGNOSIS — Z1231 Encounter for screening mammogram for malignant neoplasm of breast: Secondary | ICD-10-CM

## 2020-05-17 NOTE — Telephone Encounter (Signed)
HST 05/15/20 >> AHI 4.3, SpO2 low 82%   Please let her know her home sleep study did not show significant sleep apnea.  Her sleep apnea has improved with weight loss.  She doesn't need follow up appointment unless her sleep symptoms get worse.

## 2020-05-18 NOTE — Telephone Encounter (Signed)
Called and went over HST results per Dr Halford Chessman with patient. All questions answered and patient expressed full understanding of results. Patient aware to please call if anything changes and/or sleep symptoms get worse. Nothing further needed at this time.

## 2020-05-23 ENCOUNTER — Other Ambulatory Visit: Payer: Self-pay | Admitting: Obstetrics & Gynecology

## 2020-05-23 ENCOUNTER — Ambulatory Visit (INDEPENDENT_AMBULATORY_CARE_PROVIDER_SITE_OTHER): Payer: Medicare Other

## 2020-05-23 ENCOUNTER — Other Ambulatory Visit: Payer: Self-pay

## 2020-05-23 DIAGNOSIS — M8589 Other specified disorders of bone density and structure, multiple sites: Secondary | ICD-10-CM

## 2020-05-23 DIAGNOSIS — Z780 Asymptomatic menopausal state: Secondary | ICD-10-CM | POA: Diagnosis not present

## 2020-09-04 IMAGING — MG DIGITAL SCREENING BILAT W/ TOMO W/ CAD
8 series · 8 of 24 positions shown · non-contrast
Comparison: Previous exam(s).

CLINICAL DATA: Screening.

EXAM:
DIGITAL SCREENING BILATERAL MAMMOGRAM WITH TOMO AND CAD

[R MLO synth-2D]
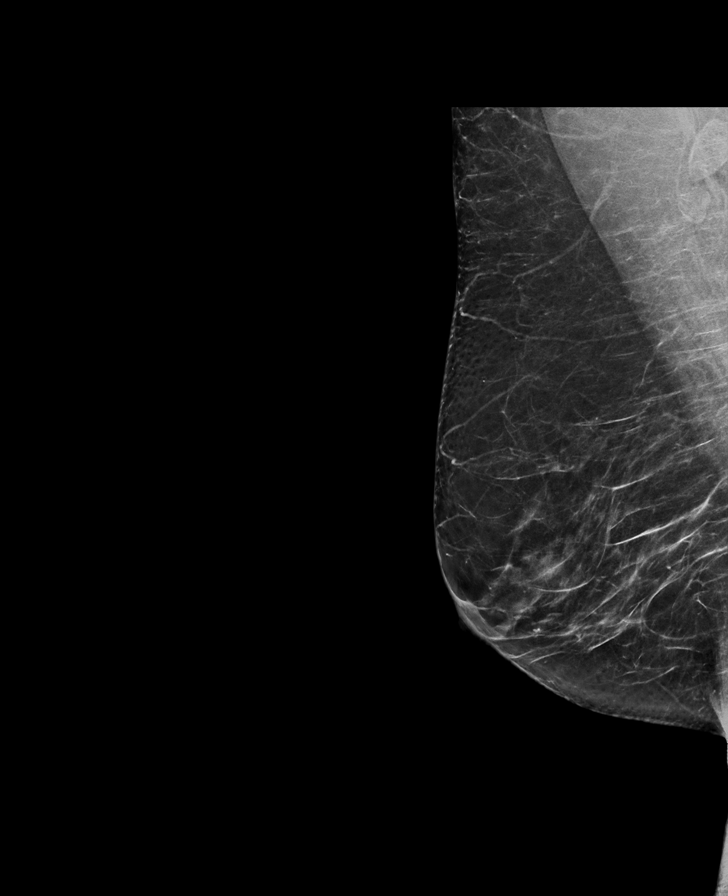

[L MLO synth-2D]
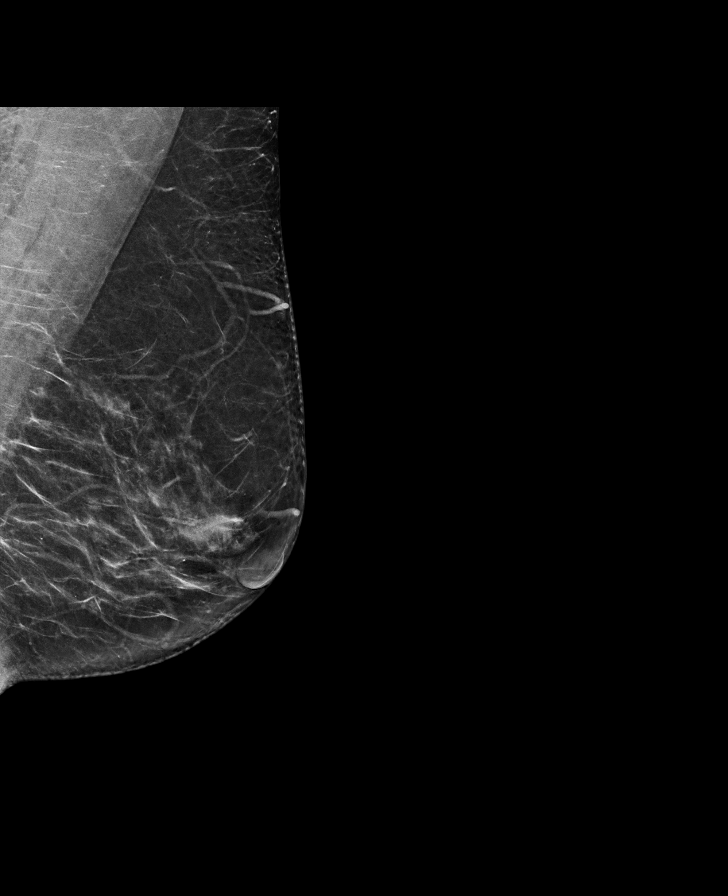

[R CC synth-2D]
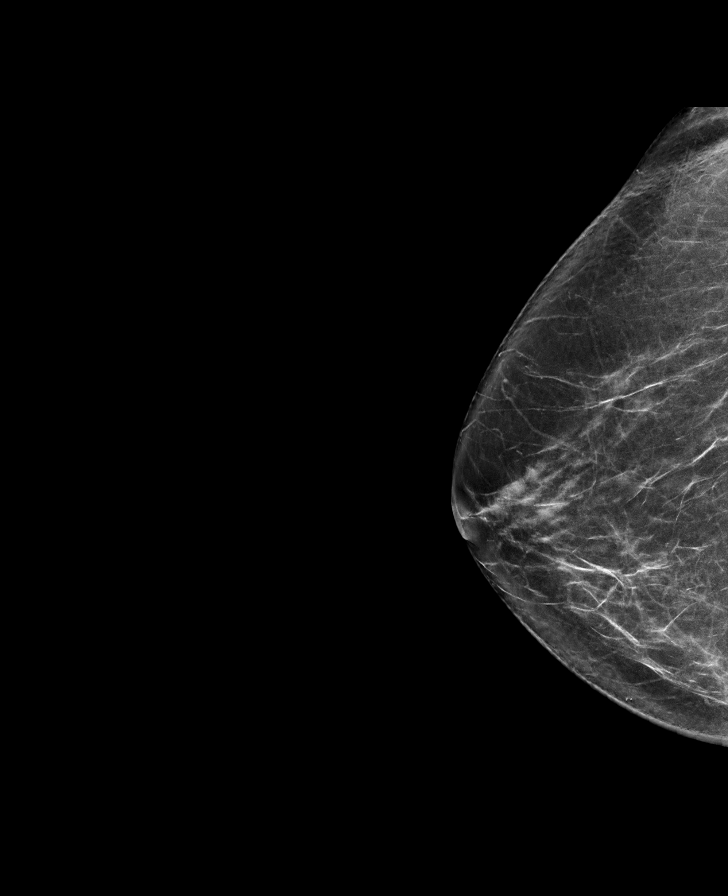

[L CC synth-2D]
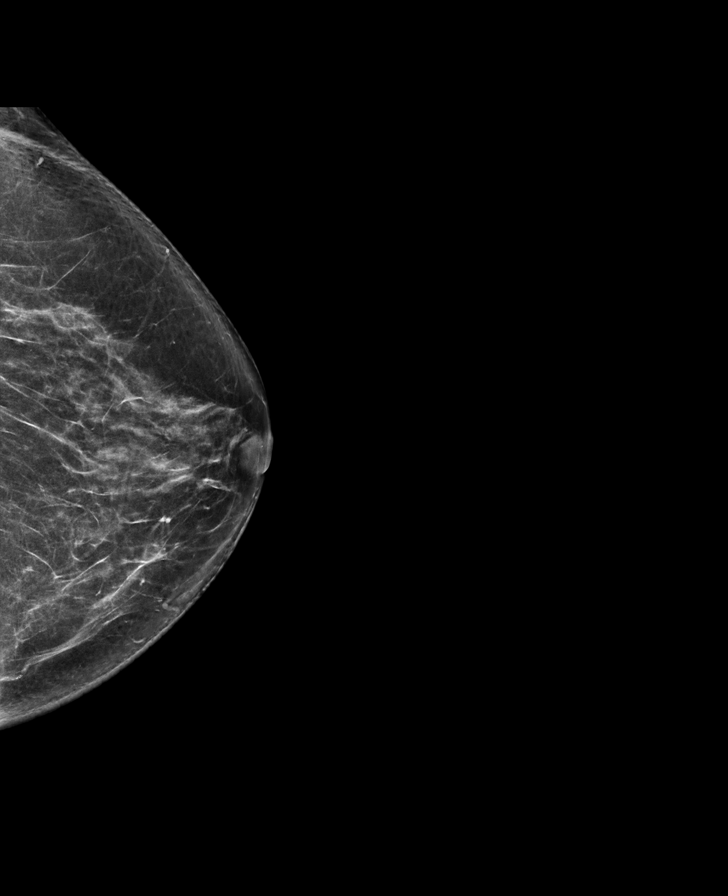

[R MLO tomo · tomo slice 35/70.0]
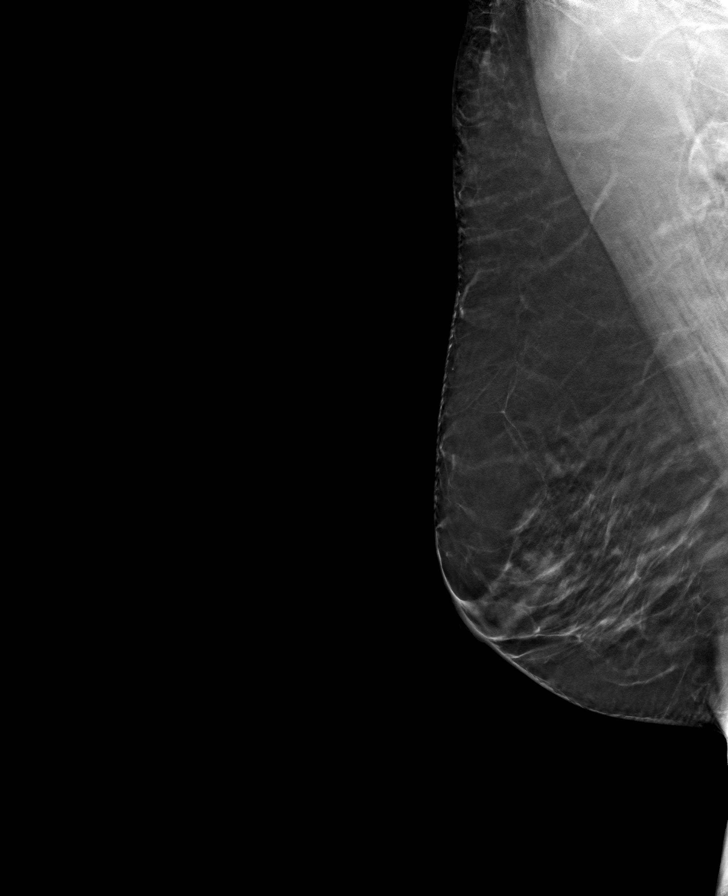

[L MLO tomo · tomo slice 35/70.0]
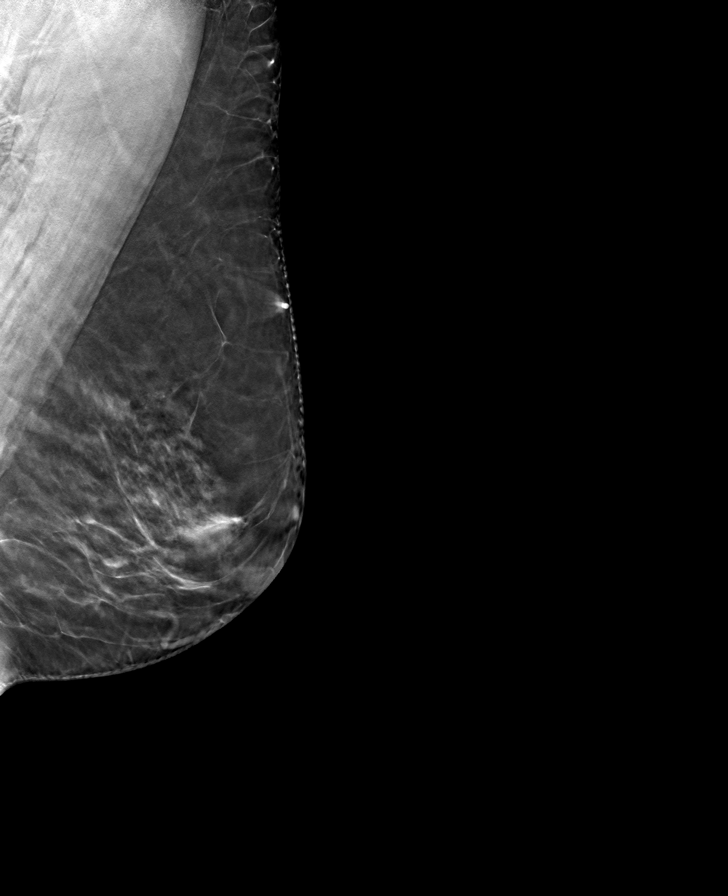

[R CC tomo · tomo slice 37/72.0]
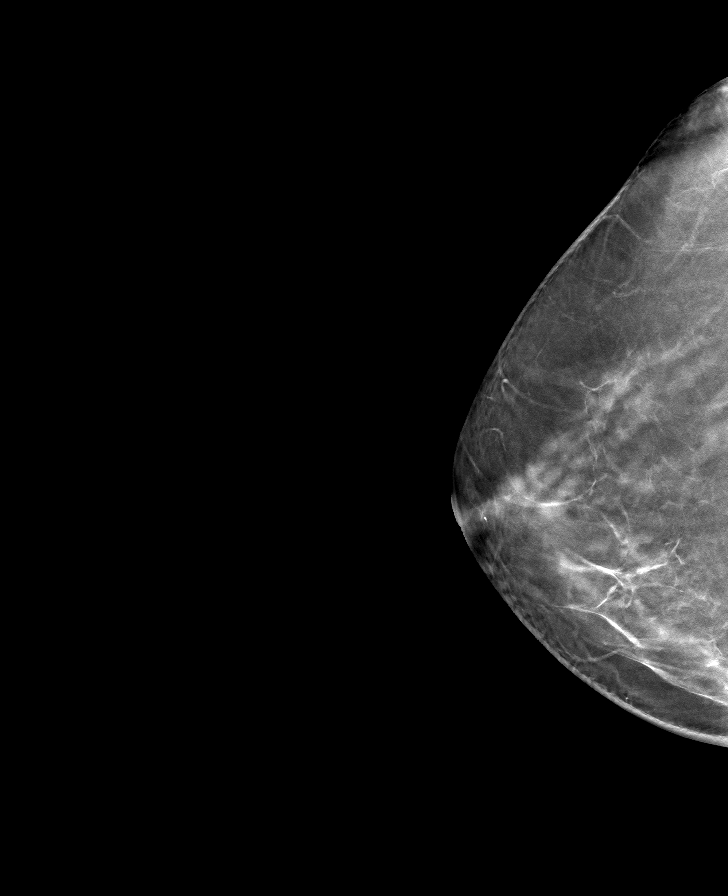

[L CC tomo · tomo slice 35/69.0]
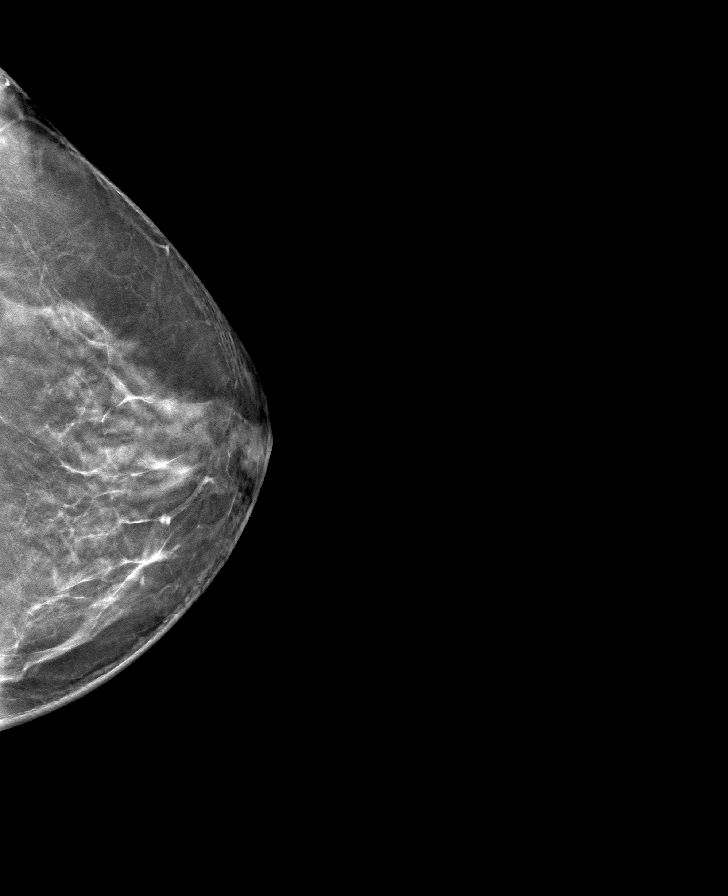

[8 of 24 positions shown; findings below may reference images not displayed]

ACR Breast Density Category c: The breast tissue is heterogeneously
dense, which may obscure small masses.
FINDINGS: There are no findings suspicious for malignancy. Images were
processed with CAD.
IMPRESSION: No mammographic evidence of malignancy. A result letter of this
screening mammogram will be mailed directly to the patient.

RECOMMENDATION:
Screening mammogram in one year. (Code:[id number])

BI-RADS CATEGORY  1: Negative.

## 2020-10-09 ENCOUNTER — Encounter: Payer: Self-pay | Admitting: Family Medicine

## 2020-10-09 ENCOUNTER — Telehealth (INDEPENDENT_AMBULATORY_CARE_PROVIDER_SITE_OTHER): Payer: Medicare Other | Admitting: Family Medicine

## 2020-10-09 ENCOUNTER — Telehealth: Payer: Self-pay | Admitting: Family Medicine

## 2020-10-09 DIAGNOSIS — U071 COVID-19: Secondary | ICD-10-CM | POA: Insufficient documentation

## 2020-10-09 DIAGNOSIS — R059 Cough, unspecified: Secondary | ICD-10-CM | POA: Insufficient documentation

## 2020-10-09 HISTORY — DX: COVID-19: U07.1

## 2020-10-09 HISTORY — DX: Cough, unspecified: R05.9

## 2020-10-09 MED ORDER — NIRMATRELVIR/RITONAVIR (PAXLOVID)TABLET: 3.0000 | ORAL_TABLET | Freq: Two times a day (BID) | ORAL | 0 refills | Status: AC

## 2020-10-09 MED ORDER — ALBUTEROL SULFATE HFA 108 (90 BASE) MCG/ACT IN AERS: 2.0000 | INHALATION_SPRAY | Freq: Four times a day (QID) | RESPIRATORY_TRACT | 0 refills | Status: DC | PRN

## 2020-10-09 MED ORDER — BENZONATATE 200 MG PO CAPS: 200.0000 mg | ORAL_CAPSULE | Freq: Two times a day (BID) | ORAL | 0 refills | Status: DC | PRN

## 2020-10-09 NOTE — Telephone Encounter (Signed)
Pt called in wanting Dr. Raoul Pitch to know her at home covid test was negative. She would like to know if she should still take the nirmatrelvir/ritonavir EUA (PAXLOVID) TABS [[id number]] . Please advise pt

## 2020-10-09 NOTE — Patient Instructions (Signed)
10 Things You Can Do to Manage Your COVID-19 Symptoms at Home If you have possible or confirmed COVID-19 Stay home except to get medical care. Monitor your symptoms carefully. If your symptoms get worse, call your healthcare provider immediately. Get rest and stay hydrated. If you have a medical appointment, call the healthcare provider ahead of time and tell them that you have or may have COVID-19. For medical emergencies, call 911 and notify the dispatch personnel that you have or may have COVID-19. Cover your cough and sneezes with a tissue or use the inside of your elbow. Wash your hands often with soap and water for at least 20 seconds or clean your hands with an alcohol-based hand sanitizer that contains at least 60% alcohol. As much as possible, stay in a specific room and away from other people in your home. Also, you should use a separate bathroom, if available. If you need to be around other people in or outside of the home, wear a mask. Avoid sharing personal items with other people in your household, like dishes, towels, and bedding. Clean all surfaces that are touched often, like counters, tabletops, and doorknobs. Use household cleaning sprays or wipes according to the label instructions. cdc.gov/coronavirus 10/29/2019 This information is not intended to replace advice given to you by your health care provider. Make sure you discuss any questions you have with your healthcare provider. Document Revised: 05/19/2020 Document Reviewed: 05/19/2020 Elsevier Patient Education  2022 Elsevier Inc.  

## 2020-10-09 NOTE — Telephone Encounter (Signed)
Yes I suspect she has either a false negative or too early for the point-of-care testing.  She has all the symptoms with direct exposure.  I would encourage her to take paxlovid

## 2020-10-09 NOTE — Progress Notes (Signed)
VIRTUAL VISIT VIA VIDEO  I connected with [name] on 10/09/20 at 10:00 AM EDT by elemedicine application and verified that I am speaking with the correct person using two identifiers. Location patient: Home Location provider: [facility], Office Persons participating in the virtual visit: Patient, Dr. Raoul Pitch and Darnell Level. Cesar, CMA  I discussed the limitations of evaluation and management by telemedicine and the availability of in person appointments. The patient expressed understanding and agreed to proceed.   SUBJECTIVE Chief Complaint  Patient presents with   Covid Exposure    Pt reports Covid exposure at home; pt c/o nasal drainage, sinus pressure chest burning, dry cough x 1 day; pt has test avail but has not tested herself;     HPI: Kayla Rosales is a 65 y.o. female present for covid exposure.  Exposure:husband went on business trip and came home with [place]. His sx started Friday.  Sx start: For her yesterday, with nasal drainage, sinus pressure, chest burning, dry cough x1 d.  Vaccine: moderna x3 Pt denies shortness of breath.  Otc: nothing.  GFR: > 90 Anticoag: N/A ROS: See pertinent positives and negatives per HPI.  Patient Active Problem List   Diagnosis Date Noted   COVID-19 10/09/2020   Cough 10/09/2020   Depression 78/29/5621   Diastolic dysfunction 30/86/5784   Heart murmur 02/16/2019   Hyperlipidemia LDL goal <100 02/16/2019   Class 1 obesity with serious comorbidity and body mass index (BMI) of 30.0 to 30.9 in adult 01/04/2019   Pelvic floor dysfunction in female 12/06/2018   Constipation due to outlet dysfunction 12/06/2018   Lactose intolerance 12/06/2018   Diverticulosis 05/27/2018   Osteopenia 01/19/2018   Irritable bowel syndrome with constipation 01/17/2018   Non-celiac gluten sensitivity 01/17/2018   History of colonic polyp 01/17/2018   History of stroke 01/15/2018   GERD (gastroesophageal reflux disease)    Asthma    S/P cervical spinal  fusion 09/29/2017   Macrocytosis without anemia 11/25/2015   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   Heart palpitations 09/08/2015   Neuropathic pain 06/08/2015   Vitamin B12 deficiency 04/06/2014   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   Vestibular neuritis 10/15/2006   Family history of malignant neoplasm of breast 03/18/2006   Diaphragmatic hernia 03/18/2006   Premature menopause 03/18/2006    Social History   Tobacco Use   Smoking status: Never   Smokeless tobacco: Never  Substance Use Topics   Alcohol use: Yes    Alcohol/week: 1.0 standard drink    Types: 1 Glasses of wine per week    Comment: 1/2-1 per day    Current Outpatient Medications:    albuterol (VENTOLIN HFA) 108 (90 Base) MCG/ACT inhaler, Inhale 2 puffs into the lungs every 6 (six) hours as needed for wheezing or shortness of breath., Disp: 8 g, Rfl: 0   aspirin EC 81 MG tablet, Take 81 mg by mouth daily., Disp: , Rfl:    atorvastatin (LIPITOR) 20 MG tablet, Take 1 tablet (20 mg total) by mouth daily., Disp: 90 tablet, Rfl: 3   benzonatate (TESSALON) 200 MG capsule, Take 1 capsule (200 mg total) by mouth 2 (two) times daily as needed for cough., Disp: 20 capsule, Rfl: 0   Cholecalciferol 25 MCG (1000 UT) capsule, Take 1,000 Units by mouth daily., Disp: , Rfl:    cyanocobalamin 1000 MCG tablet, Take 1,000 mcg by mouth daily., Disp: , Rfl:    esomeprazole (NEXIUM) 40 MG capsule, Take 1 capsule (40 mg total) by mouth  daily at 12 noon., Disp: 90 capsule, Rfl: 1   Magnesium 250 MG TABS, Take 1 tablet by mouth daily., Disp: , Rfl:    Multiple Vitamins-Minerals (ALIVE WOMENS 50+ PO), Take by mouth daily., Disp: , Rfl:    nirmatrelvir/ritonavir EUA (PAXLOVID) TABS, Take 3 tablets by mouth 2 (two) times daily for 5 days. (Take nirmatrelvir 150 mg two tablets twice daily for 5 days and ritonavir 100 mg one tablet twice daily for 5 days) Patient GFR is >90, Disp: 30 tablet, Rfl: 0   polyethylene glycol (MIRALAX /  GLYCOLAX) 17 g packet, Take 17 g by mouth daily as needed., Disp: , Rfl:    Probiotic Product (PROBIOTIC ADVANCED) CAPS, daily. , Disp: , Rfl:    Wheat Dextrin (BENEFIBER) POWD, Take 5 mg by mouth daily., Disp: , Rfl:   Allergies  Allergen Reactions   Hydromorphone Other (See Comments)    HYPOTENSION   Meperidine Other (See Comments)    "BP drops"   Sulfa Antibiotics Other (See Comments)    States she does not know the reaction     OBJECTIVE: There were no vitals taken for this visit. Gen: No acute distress. Nontoxic in appearance.  HENT: AT. [place].  MMM.  Eyes:Pupils Equal Round Reactive to light, Extraocular movements intact,  Conjunctiva without redness, discharge or icterus. Chest: Cough present Skin: no rashes, purpura or petechiae.  Neuro:  Normal gait. Alert. Oriented x3  Psych: Normal affect and demeanor. Normal speech. Normal thought content and judgment.  ASSESSMENT AND PLAN: [name] is a 65 y.o. female present for  COVID-19/cough Rest, hydrate.  Start mucinex (DM if cough) OTC Tessalon perles prescribed for cough.  paxlovid prescribed, take until completed.  Albuterol inhaler refilled 1-2 puffs every 4-6 hr, prn Isolation protocol and emergent precautions discussed.  F/U 2 weeks if not improved, sooner if worsening.    Howard Pouch, DO 10/09/2020   Return if symptoms worsen or fail to improve.  No orders of the defined types were placed in this encounter.  Meds ordered this encounter  Medications   albuterol (VENTOLIN HFA) 108 (90 Base) MCG/ACT inhaler    Sig: Inhale 2 puffs into the lungs every 6 (six) hours as needed for wheezing or shortness of breath.    Dispense:  8 g    Refill:  0    Formulary albuterol please.   nirmatrelvir/ritonavir EUA (PAXLOVID) TABS    Sig: Take 3 tablets by mouth 2 (two) times daily for 5 days. (Take nirmatrelvir 150 mg two tablets twice daily for 5 days and ritonavir 100 mg one tablet twice daily for 5 days) Patient GFR is  >90    Dispense:  30 tablet    Refill:  0   benzonatate (TESSALON) 200 MG capsule    Sig: Take 1 capsule (200 mg total) by mouth 2 (two) times daily as needed for cough.    Dispense:  20 capsule    Refill:  0   Referral Orders  No referral(s) requested today

## 2020-10-10 NOTE — Telephone Encounter (Signed)
Spoke with pt regarding meds and instructions.

## 2020-10-20 ENCOUNTER — Encounter: Payer: Self-pay | Admitting: Family Medicine

## 2020-10-22 ENCOUNTER — Telehealth: Payer: Medicare Other | Admitting: Family Medicine

## 2020-10-22 ENCOUNTER — Encounter: Payer: Self-pay | Admitting: Family Medicine

## 2020-10-22 DIAGNOSIS — J40 Bronchitis, not specified as acute or chronic: Secondary | ICD-10-CM | POA: Diagnosis not present

## 2020-10-22 DIAGNOSIS — U071 COVID-19: Secondary | ICD-10-CM

## 2020-10-22 NOTE — Patient Instructions (Signed)
As discussed- your rebound symptoms should start to subside in another day or two.  Please f/u with your PCP if no improvement

## 2020-10-22 NOTE — Progress Notes (Signed)
Ms. jakaiya, [place] are scheduled for a virtual visit with your provider today.    Just as we do with appointments in the office, we must obtain your consent to participate.  Your consent will be active for this visit and any virtual visit you may have with one of our providers in the next 365 days.    If you have a MyChart account, I can also send a copy of this consent to you electronically.  All virtual visits are billed to your insurance company just like a traditional visit in the office.  As this is a virtual visit, video technology does not allow for your provider to perform a traditional examination.  This may limit your provider's ability to fully assess your condition.  If your provider identifies any concerns that need to be evaluated in person or the need to arrange testing such as labs, EKG, etc, we will make arrangements to do so.    Although advances in technology are sophisticated, we cannot ensure that it will always work on either your end or our end.  If the connection with a video visit is poor, we may have to switch to a telephone visit.  With either a video or telephone visit, we are not always able to ensure that we have a secure connection.   I need to obtain your verbal consent now.   Are you willing to proceed with your visit today?   Jetaime Pinnix has provided verbal consent on 10/22/2020 for a virtual visit (video or telephone).   Perlie Mayo, NP 10/22/2020  11:17 AM   Date:  10/22/2020   ID:  [name], DOB 01/31/1956, MRN [id number]  Patient Location: Home Provider Location: Home Office   Participants: Patient and Provider for Visit and Wrap up  Method of visit: Video  Location of Patient: Home Location of Provider: Home Office Consent was obtain for visit over the video. Services rendered by provider: Visit was performed via video  A video enabled telemedicine application was used and I verified that I am speaking with the correct person using two  identifiers.  PCP:  Ma Hillock, DO   Chief Complaint:  covid +  History of Present Illness:    Coretha Creswell is a 65 y.o. female with history as stated below. Presents video telehealth for an acute care visit secondary to covid +    HPI from 10/09/20 PCP note: Cohen Doleman is a 65 y.o. female present for covid exposure. Exposure:husband went on business trip and came home with [place]. His sx started Friday.  Sx start: For her yesterday, with nasal drainage, sinus pressure, chest burning, dry cough x1 d.  Vaccine: moderna x3 Pt denies shortness of breath.  Otc: nothing.    Appears to have rebound symptoms today that started yesterday. Has not tried tessalon perles yet. Is using inhaler.  Past Medical, Surgical, Social History, Allergies, and Medications have been Reviewed.  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 Referral to PT today. 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    Vertigo 08/11/2015   Last Assessment & Plan:  Formatting of this note might be different from the original. Concern given gradual onset over the last week and a half in addition to neck pain. Patient needs neuroimaging to rule out dissecting vertebral aneurysm. Patient advised to go to emergency room. She declined ambulance  transport   Vitamin B 12 deficiency    Vitamin D deficiency     No outpatient medications have been marked as taking for the 10/22/20 encounter (Appointment) with Perlie Mayo, NP.     Allergies:   Hydromorphone, Meperidine, and Sulfa antibiotics   ROS See HPI for history of present illness.  Physical Exam HENT:     Head: Normocephalic.     Nose: Nose normal.  Eyes:     Conjunctiva/sclera: Conjunctivae normal.  Pulmonary:     Effort: Pulmonary effort is normal.     Comments: Dry hacky cough noted  No shortness of breath  Neurological:     Mental Status: She is alert.              A&P  1. COVID-19 Rebound symptoms -OTC management at this time -advised to f/u w pcp if not better in next 24-48 hours -has had paxlovid in last 2-3 weeks. No repeat ordered -repeat isolation reviewed  2. Bronchitis -continue inhaler  -OTC cough syrup as needed -tessalon perles as previously ordered  Patient acknowledged agreement and understanding of the plan.   Time:   Today, I have spent 15 minutes with the patient with telehealth technology discussing the above problems, reviewing the chart, previous notes, medications and orders.   Medication Changes: No orders of the defined types were placed in this encounter.    Disposition:  Follow up PRN  Signed, Perlie Mayo, NP  10/22/2020 11:17 AM

## 2020-10-22 NOTE — Progress Notes (Signed)
   Clute- pt did video

## 2020-10-23 ENCOUNTER — Encounter: Payer: Self-pay | Admitting: Family Medicine

## 2020-10-23 ENCOUNTER — Ambulatory Visit (INDEPENDENT_AMBULATORY_CARE_PROVIDER_SITE_OTHER): Payer: Medicare Other

## 2020-10-23 ENCOUNTER — Other Ambulatory Visit: Payer: Self-pay

## 2020-10-23 DIAGNOSIS — R0781 Pleurodynia: Secondary | ICD-10-CM

## 2020-10-23 DIAGNOSIS — R059 Cough, unspecified: Secondary | ICD-10-CM | POA: Diagnosis not present

## 2020-10-23 DIAGNOSIS — R5383 Other fatigue: Secondary | ICD-10-CM

## 2020-10-23 DIAGNOSIS — Z8616 Personal history of COVID-19: Secondary | ICD-10-CM | POA: Diagnosis not present

## 2020-10-23 NOTE — Telephone Encounter (Signed)
Would advise waiting for appointment with patient before prescribing. I would like her to go ahead and get a chest x-ray at [facility] today, in hopes that will be read overnight and we can discuss in the morning at her appointment.  I have placed this order for her.

## 2020-10-23 NOTE — Telephone Encounter (Signed)
Pt sched for 9 VV tomorrow.   FYI

## 2020-10-23 NOTE — Telephone Encounter (Signed)
Spoke with pt and provided directions for XR

## 2020-10-23 NOTE — Telephone Encounter (Signed)
LVM for pt to CB regarding mychart message.  Note; pt already has appt sched

## 2020-10-23 NOTE — Telephone Encounter (Signed)
We have an earlier appt avail for pt tomorrow if she would like

## 2020-10-24 ENCOUNTER — Encounter: Payer: Self-pay | Admitting: Family Medicine

## 2020-10-24 ENCOUNTER — Telehealth: Payer: Self-pay | Admitting: Family Medicine

## 2020-10-24 ENCOUNTER — Telehealth: Payer: Self-pay

## 2020-10-24 ENCOUNTER — Telehealth: Payer: Medicare Other | Admitting: Family Medicine

## 2020-10-24 ENCOUNTER — Telehealth (INDEPENDENT_AMBULATORY_CARE_PROVIDER_SITE_OTHER): Payer: Medicare Other | Admitting: Family Medicine

## 2020-10-24 DIAGNOSIS — R059 Cough, unspecified: Secondary | ICD-10-CM

## 2020-10-24 DIAGNOSIS — J40 Bronchitis, not specified as acute or chronic: Secondary | ICD-10-CM

## 2020-10-24 DIAGNOSIS — U071 COVID-19: Secondary | ICD-10-CM | POA: Diagnosis not present

## 2020-10-24 MED ORDER — DOXYCYCLINE HYCLATE 100 MG PO TABS: 100.0000 mg | ORAL_TABLET | Freq: Two times a day (BID) | ORAL | 0 refills | Status: DC

## 2020-10-24 MED ORDER — PREDNISONE 20 MG PO TABS: ORAL_TABLET | ORAL | 0 refills | Status: DC

## 2020-10-24 NOTE — Telephone Encounter (Signed)
Spoke with pt regarding PRED instructions

## 2020-10-24 NOTE — Telephone Encounter (Signed)
Please call patient: X-ray is read as bronchitis changes present and possible early pneumonia in the left lower lobe versus some scarring in that location.  I would encourage her to take the medications prescribed to her today until completed.  Follow-up in 4 weeks with provider for in person check and we will repeat her x-ray at that time to make sure complete resolution of findings.

## 2020-10-24 NOTE — Patient Instructions (Signed)
Acute Bronchitis, Adult  Acute bronchitis is when air tubes in the lungs (bronchi) suddenly get swollen. The condition can make it hard for you to breathe. In adults, acute bronchitis usually goes away within 2 weeks. A cough caused by bronchitis may last up to 3 weeks. Smoking, allergies, and asthma can make thecondition worse. What are the causes? This condition is caused by: Cold and flu viruses. The most common cause of this condition is the virus that causes the common cold. Bacteria. Substances that irritate the lungs, including: Smoke from cigarettes and other types of tobacco. Dust and pollen. Fumes from chemicals, gases, or burned fuel. Other materials that pollute indoor or outdoor air. Close contact with someone who has acute bronchitis. What increases the risk? The following factors may make you more likely to develop this condition: A weak body's defense system. This is also called the immune system. Any condition that affects your lungs and breathing, such as asthma. What are the signs or symptoms? Symptoms of this condition include: A cough. Coughing up clear, yellow, or green mucus. Wheezing. Having too much mucus in your lungs (chest congestion). Shortness of breath. A fever. Chills. Body aches. A sore throat. How is this treated? Acute bronchitis may go away over time without treatment. Your doctor may recommend: Drinking more fluids. Using a device that gets medicine into your lungs (inhaler). Using a vaporizer or a humidifier. These are machines that add water or moisture to the air. This helps with coughing and poor breathing. Taking a medicine for fever. Taking a medicine that thins mucus and clears congestion. Taking a medicine that prevents or stops coughing. Follow these instructions at home: Activity Get a lot of rest. Return to your normal activities as told by your doctor. Ask your doctor what activities are safe for you. Lifestyle  Drink enough  fluid to keep your pee (urine) pale yellow. Do not drink alcohol. Do not use any products that contain nicotine or tobacco, such as cigarettes, e-cigarettes, and chewing tobacco. If you need help quitting, ask your doctor. Be aware that: Your bronchitis will get worse if you smoke or breathe in other people's smoke (secondhand smoke). Your lungs will heal faster if you quit smoking.  General instructions Take over-the-counter and prescription medicines only as told by your doctor. Use an inhaler, cool mist vaporizer, or humidifier as told by your doctor. Rinse your mouth often with salt water. To make salt water, dissolve -1 tsp (3-6 g) of salt in 1 cup (237 mL) of warm water. Take two teaspoons of honey at bedtime. This helps lessen your coughing at night. Keep all follow-up visits as told by your doctor. This is important. How is this prevented? To lower your risk of getting this condition again: Wash your hands often with soap and water. If you cannot use soap and water, use hand sanitizer. Avoid contact with people who have cold symptoms. Try not to touch your mouth, nose, or eyes with your hands. Make sure to get the flu shot every year. Contact a doctor if: Your symptoms do not get better in 2 weeks. You vomit more than once or twice. You have symptoms of loss of fluid from your body (dehydration). These include: Dark pee. Dry skin or eyes. Increased thirst. Headaches. Confusion. Muscle cramps. Get help right away if: You cough up blood. You have chest pain. You have very bad shortness of breath. You become dehydrated. You faint or keep feeling like you are going to faint. You   have a very bad headache. Your fever or chills get worse. These symptoms may be an emergency. Get help right away. Call your local emergency services (911 in the [place] Do not wait to see if the symptoms will go away. Do not drive yourself to the hospital. Summary Acute bronchitis is when air  tubes in the lungs (bronchi) suddenly get swollen. In adults, acute bronchitis usually goes away within 2 weeks. Take over-the-counter and prescription medicines only as told by your doctor. Drink enough fluid to keep your pee (urine) pale yellow. Contact a doctor if your symptoms do not improve after 2 weeks of treatment. Get help right away if you cough up blood, faint, or have chest pain or shortness of breath. This information is not intended to replace advice given to you by your health care provider. Make sure you discuss any questions you have with your healthcare provider. Document Revised: 03/01/2020 Document Reviewed: 10/23/2018 Elsevier Patient Education  2022 Elsevier Inc.  

## 2020-10-24 NOTE — Telephone Encounter (Signed)
LVM for pt to CB regarding meds.  

## 2020-10-24 NOTE — Telephone Encounter (Signed)
Patient has questions on how to take prednisone  [phone]

## 2020-10-24 NOTE — Telephone Encounter (Signed)
Spoke with patient regarding results/recommendations. 4 week f/u has been scheduled

## 2020-10-24 NOTE — Progress Notes (Signed)
VIRTUAL VISIT VIA VIDEO  I connected with Kayla Rosales on 10/24/20 at  9:00 AM EDT by elemedicine application and verified that I am speaking with the correct person using two identifiers. Location patient: Home Location provider: [facility], Office Persons participating in the virtual visit: Patient, Dr. Raoul Pitch and Darnell Level. Cesar, CMA  I discussed the limitations of evaluation and management by telemedicine and the availability of in person appointments. The patient expressed understanding and agreed to proceed.   SUBJECTIVE Chief Complaint  Patient presents with   Covid Positive    Pt c/o fatigue, cough, nasal congestion, chills, chest tightness, SOB, wheezing; pt states sx worsen within last few days; some days worse then others;    HPI: Kayla Rosales is a 65 y.o. female present for s/p covid bronchitis.  She started with sx on 6/26 after exposure through her husband. She was treated with Paxlovid and reports she felt much better for about a week. On Thursday 7/7 her symptoms started to return and she became fatigued, dry cough, headache, loss of taste/smell, nasal congestion and chest congestion. She is using tessalon perles for cough. She had felt an occasional night sweats.    Prior note:  Exposure:husband went on business trip and came home with [place]. His sx started Friday.  Sx start: For her yesterday, with nasal drainage, sinus pressure, chest burning, dry cough x1 d.  Vaccine: moderna x3 Pt denies shortness of breath.  Otc: nothing.  GFR: > 90 Anticoag: N/A ROS: See pertinent positives and negatives per HPI.  Patient Active Problem List   Diagnosis Date Noted   COVID-19 10/09/2020   Cough 10/09/2020   Depression 45/06/8880   Diastolic dysfunction 80/06/4915   Heart murmur 02/16/2019   Hyperlipidemia LDL goal <100 02/16/2019   Class 1 obesity with serious comorbidity and body mass index (BMI) of 30.0 to 30.9 in adult 01/04/2019   Pelvic floor dysfunction in female  12/06/2018   Constipation due to outlet dysfunction 12/06/2018   Lactose intolerance 12/06/2018   Diverticulosis 05/27/2018   Osteopenia 01/19/2018   Irritable bowel syndrome with constipation 01/17/2018   Non-celiac gluten sensitivity 01/17/2018   History of colonic polyp 01/17/2018   History of stroke 01/15/2018   GERD (gastroesophageal reflux disease)    Asthma    S/P cervical spinal fusion 09/29/2017   Macrocytosis without anemia 11/25/2015   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   Heart palpitations 09/08/2015   Neuropathic pain 06/08/2015   Vitamin B12 deficiency 04/06/2014   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   Vestibular neuritis 10/15/2006   Family history of malignant neoplasm of breast 03/18/2006   Diaphragmatic hernia 03/18/2006   Premature menopause 03/18/2006    Social History   Tobacco Use   Smoking status: Never   Smokeless tobacco: Never  Substance Use Topics   Alcohol use: Yes    Alcohol/week: 1.0 standard drink    Types: 1 Glasses of wine per week    Comment: 1/2-1 per day    Current Outpatient Medications:    albuterol (VENTOLIN HFA) 108 (90 Base) MCG/ACT inhaler, Inhale 2 puffs into the lungs every 6 (six) hours as needed for wheezing or shortness of breath., Disp: 8 g, Rfl: 0   aspirin EC 81 MG tablet, Take 81 mg by mouth daily., Disp: , Rfl:    benzonatate (TESSALON) 200 MG capsule, Take 1 capsule (200 mg total) by mouth 2 (two) times daily as needed for cough., Disp: 20 capsule, Rfl: 0   Cholecalciferol 25  MCG (1000 UT) capsule, Take 1,000 Units by mouth daily., Disp: , Rfl:    cyanocobalamin 1000 MCG tablet, Take 1,000 mcg by mouth daily., Disp: , Rfl:    doxycycline (VIBRA-TABS) 100 MG tablet, Take 1 tablet (100 mg total) by mouth 2 (two) times daily., Disp: 20 tablet, Rfl: 0   esomeprazole (NEXIUM) 40 MG capsule, Take 1 capsule (40 mg total) by mouth daily at 12 noon., Disp: 90 capsule, Rfl: 1   loratadine (CLARITIN) 10 MG tablet, Take  10 mg by mouth daily., Disp: , Rfl:    Magnesium 250 MG TABS, Take 1 tablet by mouth daily., Disp: , Rfl:    Multiple Vitamins-Minerals (ALIVE WOMENS 50+ PO), Take by mouth daily., Disp: , Rfl:    polyethylene glycol (MIRALAX / GLYCOLAX) 17 g packet, Take 17 g by mouth daily as needed., Disp: , Rfl:    predniSONE (DELTASONE) 20 MG tablet, 60 mg x3d, 40 mg x3d, 20 mg x2d, 10 mg x2d, Disp: 18 tablet, Rfl: 0   Probiotic Product (PROBIOTIC ADVANCED) CAPS, daily. , Disp: , Rfl:    Wheat Dextrin (BENEFIBER) POWD, Take 5 mg by mouth daily., Disp: , Rfl:    atorvastatin (LIPITOR) 20 MG tablet, Take 1 tablet (20 mg total) by mouth daily., Disp: 90 tablet, Rfl: 3  Allergies  Allergen Reactions   Hydromorphone Other (See Comments)    HYPOTENSION   Meperidine Other (See Comments)    "BP drops"   Sulfa Antibiotics Other (See Comments)    States she does not know the reaction     OBJECTIVE: There were no vitals taken for this visit. Gen: Afebrile. No acute distress. Nontoxic. Sounds congested.  HENT: AT. [place].  Eyes:Pupils Equal Round Reactive to light, Extraocular movements intact,  Conjunctiva without redness, discharge or icterus. Chest: mild cough present. No shortness of breath.  Skin: no rashes, purpura or petechiae.  Neuro:   Alert. Oriented x3 Psych: Normal affect, dress and demeanor. Normal speech. Normal thought content and judgment.  ASSESSMENT AND PLAN: Kayla Rosales is a 65 y.o. female present for  COVID-19/cough/covid bronchitis Rest, hydrate.  Continue mucinex (DM if cough) OTC Continue Tessalon perles prescribed for cough.  Doxy bid and prednisone taper prescribed, take until completed.  Albuterol inhaler refilled 1-2 puffs every 4-6 hr, prn Awaiting cxr results pre-ordered yesterday for her- poss. Early PNA present. If PNA present> f/u 4 weeks with provider. If CXR negative f/u PRN, sooner if worsening.    Howard Pouch, DO 10/24/2020   No follow-ups on file.  No orders of  the defined types were placed in this encounter.  Meds ordered this encounter  Medications   doxycycline (VIBRA-TABS) 100 MG tablet    Sig: Take 1 tablet (100 mg total) by mouth 2 (two) times daily.    Dispense:  20 tablet    Refill:  0   predniSONE (DELTASONE) 20 MG tablet    Sig: 60 mg x3d, 40 mg x3d, 20 mg x2d, 10 mg x2d    Dispense:  18 tablet    Refill:  0    Referral Orders  No referral(s) requested today

## 2020-10-27 ENCOUNTER — Encounter: Payer: Self-pay | Admitting: Family Medicine

## 2020-10-27 NOTE — Telephone Encounter (Signed)
Please advise 

## 2020-10-30 ENCOUNTER — Telehealth: Payer: Self-pay

## 2020-10-30 ENCOUNTER — Other Ambulatory Visit: Payer: Self-pay

## 2020-10-30 MED ORDER — ALBUTEROL SULFATE HFA 108 (90 BASE) MCG/ACT IN AERS: 2.0000 | INHALATION_SPRAY | Freq: Four times a day (QID) | RESPIRATORY_TRACT | 0 refills | Status: DC | PRN

## 2020-10-30 NOTE — Telephone Encounter (Signed)
Patient refill request.  Also patient wants to know if it is okay for her to take Pepcid along with other meds.  Please call [phone].  albuterol (VENTOLIN HFA) 108 (90 Base) MCG/ACT inhaler [[id number]]   [facility]

## 2020-10-30 NOTE — Telephone Encounter (Signed)
Rx sent. In another encounter today

## 2020-11-21 ENCOUNTER — Other Ambulatory Visit: Payer: Self-pay | Admitting: Family Medicine

## 2020-11-23 ENCOUNTER — Encounter: Payer: Self-pay | Admitting: Family Medicine

## 2020-11-23 ENCOUNTER — Other Ambulatory Visit: Payer: Self-pay

## 2020-11-23 ENCOUNTER — Ambulatory Visit (INDEPENDENT_AMBULATORY_CARE_PROVIDER_SITE_OTHER): Payer: Medicare Other | Admitting: Family Medicine

## 2020-11-23 VITALS — BP 105/63 | HR 63 | Temp 98.3°F | Ht 65.0 in | Wt 174.0 lb

## 2020-11-23 DIAGNOSIS — J40 Bronchitis, not specified as acute or chronic: Secondary | ICD-10-CM | POA: Diagnosis not present

## 2020-11-23 DIAGNOSIS — U071 COVID-19: Secondary | ICD-10-CM | POA: Diagnosis not present

## 2020-11-23 NOTE — Patient Instructions (Signed)
I am glad you are feeling better.  We will call you with results of chest xray.

## 2020-11-23 NOTE — Progress Notes (Signed)
SUBJECTIVE Chief Complaint  Patient presents with   Bronchitis    4 wk f/u;     HPI: Kayla Rosales is a 65 y.o. female present for s/p covid bronchitis   She started with sx on 6/26 after exposure through her husband. She was treated with Paxlovid and reports she felt much better for about a week. On Thursday 7/7 her symptoms started to return and she became fatigued, dry cough, headache, loss of taste/smell, nasal congestion and chest congestion. She is using tessalon perles for cough. She had felt an occasional night sweats. She was then treated for covid bronchitis with doxy and prednisone. She is feeling much better. No further cough. She has mild fatigue.    Patient Active Problem List   Diagnosis Date Noted   COVID-19 10/09/2020   Cough 10/09/2020   Depression [id number]   Diastolic dysfunction [id number]   Heart murmur 02/16/2019   Hyperlipidemia LDL goal <100 02/16/2019   Class 1 obesity with serious comorbidity and body mass index (BMI) of 30.0 to 30.9 in adult 01/04/2019   Pelvic floor dysfunction in female 12/06/2018   Constipation due to outlet dysfunction 12/06/2018   Lactose intolerance 12/06/2018   Diverticulosis 05/27/2018   Osteopenia 01/19/2018   Irritable bowel syndrome with constipation 01/17/2018   Non-celiac gluten sensitivity 01/17/2018   History of colonic polyp 01/17/2018   History of stroke 01/15/2018   GERD (gastroesophageal reflux disease)    Asthma    S/P cervical spinal fusion 09/29/2017   Macrocytosis without anemia 11/25/2015   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   Heart palpitations 09/08/2015   Neuropathic pain 06/08/2015   Vitamin B12 deficiency 04/06/2014   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   Vestibular neuritis 10/15/2006   Family history of malignant neoplasm of breast 03/18/2006   Diaphragmatic hernia 03/18/2006   Premature menopause 03/18/2006    Social History   Tobacco Use   Smoking status: Never   Smokeless  tobacco: Never  Substance Use Topics   Alcohol use: Yes    Alcohol/week: 1.0 standard drink    Types: 1 Glasses of wine per week    Comment: 1/2-1 per day    Current Outpatient Medications:    albuterol (VENTOLIN HFA) 108 (90 Base) MCG/ACT inhaler, Inhale 2 puffs into the lungs every 6 (six) hours as needed for wheezing or shortness of breath., Disp: 8 g, Rfl: 0   atorvastatin (LIPITOR) 20 MG tablet, Take 1 tablet (20 mg total) by mouth daily., Disp: 90 tablet, Rfl: 3   Cholecalciferol 25 MCG (1000 UT) capsule, Take 1,000 Units by mouth daily., Disp: , Rfl:    cyanocobalamin 1000 MCG tablet, Take 1,000 mcg by mouth daily., Disp: , Rfl:    esomeprazole (NEXIUM) 40 MG capsule, Take 1 capsule (40 mg total) by mouth daily at 12 noon., Disp: 90 capsule, Rfl: 1   loratadine (CLARITIN) 10 MG tablet, Take 10 mg by mouth daily as needed., Disp: , Rfl:    Magnesium 250 MG TABS, Take 1 tablet by mouth daily., Disp: , Rfl:    Multiple Vitamins-Minerals (ALIVE WOMENS 50+ PO), Take by mouth daily., Disp: , Rfl:    polyethylene glycol (MIRALAX / GLYCOLAX) 17 g packet, Take 17 g by mouth daily as needed., Disp: , Rfl:    Probiotic Product (PROBIOTIC ADVANCED) CAPS, daily. , Disp: , Rfl:    Wheat Dextrin (BENEFIBER) POWD, Take 5 mg by mouth daily., Disp: , Rfl:    aspirin EC 81 MG tablet,  Take 81 mg by mouth daily. (Patient not taking: Reported on 11/23/2020), Disp: , Rfl:   Allergies  Allergen Reactions   Hydromorphone Other (See Comments)    HYPOTENSION   Meperidine Other (See Comments)    "BP drops"   Sulfa Antibiotics Other (See Comments)    States she does not know the reaction     OBJECTIVE: BP 105/63   Pulse 63   Temp 98.3 F (36.8 C) (Oral)   Ht '5\' 5"'$  (1.651 m)   Wt 174 lb (78.9 kg)   SpO2 96%   BMI 28.96 kg/m  Gen: Afebrile. No acute distress.  HENT: AT. [place].  Eyes:Pupils Equal Round Reactive to light, Extraocular movements intact,  Conjunctiva without redness, discharge or  icterus. Neck/lymp/endocrine: Supple,lymphadenopathy CV: RRR no murmur, no edema Chest: CTAB, no wheeze or crackles Abd: Soft. NTND. BS present Skin: no rashes, purpura or petechiae.  Neuro: Normal gait. PERLA. EOMi. Alert. Oriented x3 Psych: Normal affect, dress and demeanor. Normal speech. Normal thought content and judgment..    ASSESSMENT AND PLAN: Kayla Rosales is a 65 y.o. female present for  Covid bronchitis; - she is seeing great improvement.  - lungs are clear today.  - rpt chest xray completed for bronchitis/early PNA follow to ensure clearance of abnormalities visualized on prior cxr.   Howard Pouch, DO 11/23/2020  > 20 Minutes was dedicated to this patient's encounter to include pre-visit review of chart, face-to-face time with patient and post-visit work- which include documentation and prescribing medications and/or ordering test when necessary.    Return if symptoms worsen or fail to improve.  Orders Placed This Encounter  Procedures   DG Chest 2 View    No orders of the defined types were placed in this encounter.   Referral Orders  No referral(s) requested today

## 2020-12-02 ENCOUNTER — Ambulatory Visit (INDEPENDENT_AMBULATORY_CARE_PROVIDER_SITE_OTHER): Payer: Medicare Other

## 2020-12-02 ENCOUNTER — Other Ambulatory Visit: Payer: Self-pay

## 2020-12-02 DIAGNOSIS — J40 Bronchitis, not specified as acute or chronic: Secondary | ICD-10-CM

## 2020-12-02 DIAGNOSIS — U071 COVID-19: Secondary | ICD-10-CM | POA: Diagnosis not present

## 2020-12-26 NOTE — Progress Notes (Signed)
12/26/2020 Kayla Rosales [id number] 01-Nov-1955   Chief Complaint:  Rectal pain, LLQ pain  History of Present Illness: Kayla Rosales is a 65 year old female with a past medical history of  CVA (lacunar infarct of left basal ganglia) 07/2015 possible due to HRT, IBS-C, pelvic floor dysfunction, nonceliac gluten sensitivity, lactose intolerance, diverticulosis and colon polys.  She is followed by Dr. Carlean Purl.  She presents her office today for further evaluation regarding rectal pain.  She drove to [place] to visit her daughter for a few days then drove to [facility] to visit her son and during her time of travel she developed rectal pain.  She initially thought she had lower back/sacral pain while driving.  Rectal pain was if she does not pass a bowel movement within a few days.  She is passing a soft formed bowel movement most days but does not feel emptied.  She described feeling as if her "insides dropped out".  She takes MiraLAX every few days which controls her constipation without significant straining.  If she takes MiraLAX daily she has numerous BMs which is unfavorable.  He underwent pelvic floor exercise therapy in the past which was somewhat helpful. She has gained 17 pounds over the past year after her father died.  She ate a [place] food 2 weeks ago and later that evening developed nausea.  She vomited partially digested food the next day with associated left upper abdominal pain.  She took Pepcid and continue her Nexium without further vomiting and her LUQ pain abated.  She also noted having intermittent LUQ pain that comes and goes every few weeks.  No heartburn.  No dysphagia.  She stated taking Nexium on and off since age 63 but is taking this medication daily for the past 10 years.  She underwent an EGD by GI in [place] approximately 8 years ago, she does not recall the results.  Her most recent colonoscopy was 05/26/2018 which identified 5 sessile serrated polyps removed from the rectum and  colon.  She was advised by Dr. Carlean Purl to repeat a colonoscopy 05/2021.  She wishes to undergo an EGD at the time of her colonoscopy.    Colonoscopy 05/26/2018: - Five 1 to 6 mm sessile serrated polyps in the rectum, in the transverse colon and at the appendiceal orifice, removed with a cold snare. Resected and retrieved. - Severe diverticulosis in the sigmoid colon. There was narrowing of the colon in association with the diverticular opening. - Mild diverticulosis in the right colon. - Internal hemorrhoids. - The examination was otherwise normal on direct and retroflexion views. - 3 year recall.  SESSILE SERRATED POLYP(S) WITHOUT CYTOLOGIC DYSPLASIA    Current Outpatient Medications on File Prior to Visit  Medication Sig Dispense Refill   albuterol (VENTOLIN HFA) 108 (90 Base) MCG/ACT inhaler Inhale 2 puffs into the lungs every 6 (six) hours as needed for wheezing or shortness of breath. 8 g 0   atorvastatin (LIPITOR) 20 MG tablet Take 1 tablet (20 mg total) by mouth daily. 90 tablet 3   Cholecalciferol 25 MCG (1000 UT) capsule Take 1,000 Units by mouth daily.     cyanocobalamin 1000 MCG tablet Take 1,000 mcg by mouth daily.     esomeprazole (NEXIUM) 40 MG capsule Take 1 capsule (40 mg total) by mouth daily at 12 noon. 90 capsule 1   loratadine (CLARITIN) 10 MG tablet Take 10 mg by mouth daily as needed.     Magnesium 250 MG TABS Take 1  tablet by mouth daily.     Multiple Vitamins-Minerals (ALIVE WOMENS 50+ PO) Take by mouth daily.     polyethylene glycol (MIRALAX / GLYCOLAX) 17 g packet Take 17 g by mouth daily as needed.     Probiotic Product (PROBIOTIC ADVANCED) CAPS daily.      Wheat Dextrin (BENEFIBER) POWD Take 5 mg by mouth daily.     No current facility-administered medications on file prior to visit.   Allergies  Allergen Reactions   Hydromorphone Other (See Comments)    HYPOTENSION   Meperidine Other (See Comments)    "BP drops"   Sulfa Antibiotics Other (See Comments)     States she does not know the reaction     Current Medications, Allergies, Past Medical History, Past Surgical History, Family History and Social History were reviewed in [employer] record.  Review of Systems:   Constitutional: Negative for fever, sweats, chills or weight loss.  Respiratory: Negative for shortness of breath.   Cardiovascular: Negative for chest pain, palpitations and leg swelling.  Gastrointestinal: See HPI.  Musculoskeletal: Negative for back pain or muscle aches.  Neurological: Negative for dizziness, headaches or paresthesias.    Physical Exam: BP 122/78 (BP Location: Left Arm, Patient Position: Sitting, Cuff Size: Normal)   Pulse 70   Ht '5\' 5"'$  (1.651 m)   Wt 175 lb 4 oz (79.5 kg)   BMI 29.16 kg/m   General: Well developed 65 year old female in no acute distress. Head: Normocephalic and atraumatic. Eyes: No scleral icterus. Conjunctiva pink . Ears: Normal auditory acuity. Mouth: Dentition intact. No ulcers or lesions.  Lungs: Clear throughout to auscultation. Heart: Regular rate and rhythm, no murmur. Abdomen: Soft, nondistended. LLQ fullness palpated with associated tenderness without rebound or guarding .No masses or hepatomegaly. Normal bowel sounds x 4 quadrants.  Rectal: Thickened anal folds, internal hemorrhoids without prolapse. No mass. No rectal prolapse. No stool or blood in the rectal vault. Diminished anal sphincter tone.  CMA Melissa present during exam. Musculoskeletal: Symmetrical with no gross deformities. Extremities: No edema. Neurological: Alert oriented x 4. No focal deficits.  Psychological: Alert and cooperative. Normal mood and affect  Assessment and Recommendations:  1) History of diverticulosis, LLQ fullness with tenderness on exam -CBC, CMP -CTAP with oral and IV contrast   2) Chronic constipation, incomplete stool evacuation  -Miralax as tolerated PRN -Continue Benefiber 1 TBSP daily  -Rectal  physical therapy  3) Rectal pain, no mass or rectal prolapse on exam.  History of internal hemorrhoids. -Apply a small amount of Desitin inside the anal opening and to the external anal area tid as needed for anal or hemorrhoidal irritation/bleeding.  -Await CTAP results, consider earlier colonoscopy if rectal pain persists or worsens  4) History of colon polyps  -Next colonoscopy due 2/203, consider earlier colonoscopy if rectal and LLQ persist or worsens, await CTAP results   5) LUQ pain, intermittent with 1 episode of N/V 1 week ago which occurred after eating [place] food -Continue Nexium daily -EGD at time of next colonoscopy

## 2020-12-27 ENCOUNTER — Other Ambulatory Visit (INDEPENDENT_AMBULATORY_CARE_PROVIDER_SITE_OTHER): Payer: Medicare Other

## 2020-12-27 ENCOUNTER — Encounter: Payer: Self-pay | Admitting: Nurse Practitioner

## 2020-12-27 ENCOUNTER — Ambulatory Visit (INDEPENDENT_AMBULATORY_CARE_PROVIDER_SITE_OTHER): Payer: Medicare Other | Admitting: Nurse Practitioner

## 2020-12-27 VITALS — BP 122/78 | HR 70 | Ht 65.0 in | Wt 175.2 lb

## 2020-12-27 DIAGNOSIS — R1032 Left lower quadrant pain: Secondary | ICD-10-CM

## 2020-12-27 DIAGNOSIS — K6289 Other specified diseases of anus and rectum: Secondary | ICD-10-CM

## 2020-12-27 LAB — COMPREHENSIVE METABOLIC PANEL
ALT: 21 U/L (ref 0–35)
AST: 19 U/L (ref 0–37)
Albumin: 4 g/dL (ref 3.5–5.2)
Alkaline Phosphatase: 75 U/L (ref 39–117)
BUN: 16 mg/dL (ref 6–23)
CO2: 26 mEq/L (ref 19–32)
Calcium: 9.4 mg/dL (ref 8.4–10.5)
Chloride: 104 mEq/L (ref 96–112)
Creatinine, Ser: 0.62 mg/dL (ref 0.40–1.20)
GFR: 93.1 mL/min (ref >60.00)
Glucose, Bld: 88 mg/dL (ref 70–99)
Potassium: 4 mEq/L (ref 3.5–5.1)
Sodium: 138 mEq/L (ref 135–145)
Total Bilirubin: 0.9 mg/dL (ref 0.2–1.2)
Total Protein: 6.7 g/dL (ref 6.0–8.3)

## 2020-12-27 LAB — CBC WITH DIFFERENTIAL/PLATELET
Basophils Absolute: 0 10*3/uL (ref 0.0–0.1)
Basophils Relative: 0.7 % (ref 0.0–3.0)
Eosinophils Absolute: 0.1 10*3/uL (ref 0.0–0.7)
Eosinophils Relative: 3 % (ref 0.0–5.0)
HCT: 41.7 % (ref 36.0–46.0)
Hemoglobin: 14.5 g/dL (ref 12.0–15.0)
Lymphocytes Relative: 18.7 % (ref 12.0–46.0)
Lymphs Abs: 0.9 10*3/uL (ref 0.7–4.0)
MCHC: 34.8 g/dL (ref 30.0–36.0)
MCV: 99.5 fl (ref 78.0–100.0)
Monocytes Absolute: 0.4 10*3/uL (ref 0.1–1.0)
Monocytes Relative: 7 % (ref 3.0–12.0)
Neutro Abs: 3.5 10*3/uL (ref 1.4–7.7)
Neutrophils Relative %: 70.6 % (ref 43.0–77.0)
Platelets: 225 10*3/uL (ref 150.0–400.0)
RBC: 4.19 Mil/uL (ref 3.87–5.11)
RDW: 13.1 % (ref 11.5–15.5)
WBC: 5 10*3/uL (ref 4.0–10.5)

## 2020-12-27 NOTE — Patient Instructions (Addendum)
If you are age 66 or older, your body mass index should be between 23-30. Your Body mass index is 29.16 kg/m. If this is out of the aforementioned range listed, please consider follow up with your Primary Care Provider.  The [facility] providers would like to encourage you to use [facility] to communicate with providers for non-urgent requests or questions.  Due to long hold times on the telephone, sending your provider a message by [facility] may be faster and more efficient way to get a response. Please allow 48 business hours for a response.  Please remember that this is for non-urgent requests/questions.  We have referred you for pelvic/rectal floor therapy. You should receive a call from them in the next couple weeks.  IMAGING: You will be contacted by [facility] (Your caller ID will indicate phone # [phone]) in the next 2 days to schedule your Abdominal CT scan. If you have not heard from them within 2 business days, please call [facility] at [phone] to follow up on the status of your appointment.    RECOMMENDATIONS: Desitin: Apply a small amount to the external and internal anal area three times a day as needed. Continue Miralax as needed. Continue fiber.  It was great seeing you today! Thank you for entrusting me with your care and choosing [facility].  Noralyn Pick, CRNP

## 2020-12-27 NOTE — Progress Notes (Signed)
RADIOLOGY SCHEDULING REQUEST FOR Abd CT Scan [facility] Scheduling via secure staff message.

## 2021-01-01 ENCOUNTER — Telehealth: Payer: Self-pay | Admitting: Nurse Practitioner

## 2021-01-01 NOTE — Telephone Encounter (Signed)
Returned the patient's call. No answer. Left her a message to call us again.

## 2021-01-01 NOTE — Telephone Encounter (Signed)
Inbound call from patient. States she have been having constipation, vomitting, and digestion problems since the weekends. Is currently taking miralax 2 times a day, fiber, eating fruits, and lots of water. Best contact number [phone]

## 2021-01-02 NOTE — Telephone Encounter (Signed)
Attempted to contact patient, no answer. Left message to call back.

## 2021-01-02 NOTE — Telephone Encounter (Signed)
Called the patient. No answer. Left her the information on her voicemail. CT is 01/04/21.

## 2021-01-02 NOTE — Telephone Encounter (Signed)
She has a pending CT scan that will be important to understand what her problem is.  Can she wait until we get that information?  That would be my preference

## 2021-01-02 NOTE — Telephone Encounter (Signed)
Patient with complaints of bloating and incomplete evacuation. She was seen by Carl Best recently for this. She has taken the Miralax twice on Sunday and once on Monday. She did not feel Miralax was helping and did not take it again. She states she is getting lots of natural fiber. Reports she feels bloated, passing gas, a little nauseated without vomiting. She is able to move a small amount of soft stool but with difficulty. Please advise .  Thanks

## 2021-01-04 ENCOUNTER — Ambulatory Visit (HOSPITAL_COMMUNITY)
Admission: RE | Admit: 2021-01-04 | Discharge: 2021-01-04 | Disposition: A | Discharge status: Home / Self Care | Payer: Medicare Other | Source: Ambulatory Visit | Attending: Nurse Practitioner | Admitting: Nurse Practitioner

## 2021-01-04 DIAGNOSIS — K6289 Other specified diseases of anus and rectum: Secondary | ICD-10-CM | POA: Diagnosis present

## 2021-01-04 DIAGNOSIS — R1032 Left lower quadrant pain: Secondary | ICD-10-CM | POA: Diagnosis present

## 2021-01-04 MED ORDER — IOHEXOL 350 MG/ML SOLN
80.0000 mL | Freq: Once | INTRAVENOUS | Status: AC | PRN
  Administered 2021-01-04: 80 mL via INTRAVENOUS

## 2021-01-26 ENCOUNTER — Ambulatory Visit (AMBULATORY_SURGERY_CENTER): Payer: Medicare Other | Admitting: *Deleted

## 2021-01-26 ENCOUNTER — Telehealth: Payer: Self-pay | Admitting: *Deleted

## 2021-01-26 ENCOUNTER — Other Ambulatory Visit: Payer: Self-pay

## 2021-01-26 VITALS — Ht 65.0 in | Wt 175.0 lb

## 2021-01-26 DIAGNOSIS — R1012 Left upper quadrant pain: Secondary | ICD-10-CM

## 2021-01-26 DIAGNOSIS — R1032 Left lower quadrant pain: Secondary | ICD-10-CM

## 2021-01-26 NOTE — Progress Notes (Signed)
Virtual pre visit completed. Instructions forwarded through [place].     No egg or soy allergy known to patient  No issues known to pt with past sedation with any surgeries or procedures Patient denies ever being told they had issues or difficulty with intubation  No FH of Malignant Hyperthermia Pt is not on diet pills Pt is not on  home 02  Pt is not on blood thinners  Pt denies issues with constipation  No A fib or A flutter  Pt is fully vaccinated  for Covid    Due to the COVID-19 pandemic we are asking patients to follow certain guidelines in [place] and the [place]   Pt aware of COVID protocols and [facility] guidelines

## 2021-01-26 NOTE — Telephone Encounter (Signed)
Attempted to contact patient for virtual pre visit .  LVM.

## 2021-02-01 ENCOUNTER — Ambulatory Visit (AMBULATORY_SURGERY_CENTER): Payer: Medicare Other | Admitting: Internal Medicine

## 2021-02-01 ENCOUNTER — Encounter: Payer: Self-pay | Admitting: Internal Medicine

## 2021-02-01 VITALS — BP 142/68 | HR 60 | Temp 97.5°F | Resp 28 | Ht 65.0 in | Wt 175.0 lb

## 2021-02-01 DIAGNOSIS — K5730 Diverticulosis of large intestine without perforation or abscess without bleeding: Secondary | ICD-10-CM | POA: Diagnosis not present

## 2021-02-01 DIAGNOSIS — R1012 Left upper quadrant pain: Secondary | ICD-10-CM | POA: Diagnosis present

## 2021-02-01 DIAGNOSIS — R194 Change in bowel habit: Secondary | ICD-10-CM

## 2021-02-01 DIAGNOSIS — D123 Benign neoplasm of transverse colon: Secondary | ICD-10-CM

## 2021-02-01 DIAGNOSIS — K635 Polyp of colon: Secondary | ICD-10-CM

## 2021-02-01 DIAGNOSIS — D120 Benign neoplasm of cecum: Secondary | ICD-10-CM

## 2021-02-01 DIAGNOSIS — R1032 Left lower quadrant pain: Secondary | ICD-10-CM

## 2021-02-01 DIAGNOSIS — K6289 Other specified diseases of anus and rectum: Secondary | ICD-10-CM

## 2021-02-01 MED ORDER — SODIUM CHLORIDE 0.9 % IV SOLN: 500.0000 mL | Freq: Once | INTRAVENOUS | Status: DC

## 2021-02-01 MED ORDER — FENTANYL CITRATE (PF) 100 MCG/2ML IJ SOLN
25.0000 ug | Freq: Once | INTRAMUSCULAR | Status: AC
Start: 2021-02-01 — End: 2021-02-01
  Administered 2021-02-01: 25 ug via INTRAVENOUS

## 2021-02-01 NOTE — Patient Instructions (Addendum)
The upper endoscopy looks normal.  The colonoscopy was challenging to accomplish due to an elongated and floppy colon, what is called a redundant colon. I have no doubt this is part of your defecation problems - as people with this type of situation often struggle with constipation. It is not a health hazard but disturbs quality of life. Looking back at last procedure it was challenging to accomplish then as well.  We had to move you and apply abdominal pressure to insert the sope and you may be sore.  I did see and remove 3 benign-appearing polyps today.  Please continue with the physical therapy. I will contact you with polyp results and plans.  I appreciate the opportunity to care for you. Gatha Mayer, MD, [facility]   Handouts given for polyps and diverticulosis.   YOU HAD AN ENDOSCOPIC PROCEDURE TODAY AT [facility]:   Refer to the procedure report that was given to you for any specific questions about what was found during the examination.  If the procedure report does not answer your questions, please call your gastroenterologist to clarify.  If you requested that your care partner not be given the details of your procedure findings, then the procedure report has been included in a sealed envelope for you to review at your convenience later.  YOU SHOULD EXPECT: Some feelings of bloating in the abdomen. Passage of more gas than usual.  Walking can help get rid of the air that was put into your GI tract during the procedure and reduce the bloating. If you had a lower endoscopy (such as a colonoscopy or flexible sigmoidoscopy) you may notice spotting of blood in your stool or on the toilet paper. If you underwent a bowel prep for your procedure, you may not have a normal bowel movement for a few days.  Please Note:  You might notice some irritation and congestion in your nose or some drainage.  This is from the oxygen used during your procedure.  There is no need for concern  and it should clear up in a day or so.  SYMPTOMS TO REPORT IMMEDIATELY:  Following lower endoscopy (colonoscopy):  Excessive amounts of blood in the stool  Significant tenderness or worsening of abdominal pains  Swelling of the abdomen that is new, acute  Fever of 100F or higher  Following upper endoscopy (EGD)  Vomiting of blood or coffee ground material  New chest pain or pain under the shoulder blades  Painful or persistently difficult swallowing  New shortness of breath  Black, tarry-looking stools  For urgent or emergent issues, a gastroenterologist can be reached at any hour by calling [phone]. Do not use MyChart messaging for urgent concerns.    DIET:  We do recommend a small meal at first, but then you may proceed to your regular diet.  Drink plenty of fluids but you should avoid alcoholic beverages for 24 hours.  ACTIVITY:  You should plan to take it easy for the rest of today and you should NOT DRIVE or use heavy machinery until tomorrow (because of the sedation medicines used during the test).    FOLLOW UP: Our staff will call the number listed on your records 48-72 hours following your procedure to check on you and address any questions or concerns that you may have regarding the information given to you following your procedure. If we do not reach you, we will leave a message.  We will attempt to reach you two times.  During this call, we will ask if you have developed any symptoms of COVID 19. If you develop any symptoms (ie: fever, flu-like symptoms, shortness of breath, cough etc.) before then, please call [phone].  If you test positive for Covid 19 in the 2 weeks post procedure, please call and report this information to [place].    If any biopsies were taken you will be contacted by phone or by letter within the next 1-3 weeks.  Please call us at [phone] if you have not heard about the biopsies in 3 weeks.    SIGNATURES/CONFIDENTIALITY: You and/or  your care partner have signed paperwork which will be entered into your electronic medical record.  These signatures attest to the fact that that the information above on your After Visit Summary has been reviewed and is understood.  Full responsibility of the confidentiality of this discharge information lies with you and/or your care-partner.

## 2021-02-01 NOTE — Progress Notes (Signed)
[facility] History and Physical   Primary Care Physician:  Ma Hillock, DO   Reason for Procedure:   LUQ pain, rectal pain, altered bowel habits, hx colon polyps  Plan:    EGD and colonoscopy     HPI: Kayla Rosales is a 65 y.o. female seen in our office on 12/27/20 w/ following history  History of Present Illness: Kayla Rosales is a 65 year old female with a past medical history of  CVA (lacunar infarct of left basal ganglia) 07/2015 possible due to HRT, IBS-C, pelvic floor dysfunction, nonceliac gluten sensitivity, lactose intolerance, diverticulosis and colon polys.  She is followed by Dr. Carlean Purl.  She presents her office today for further evaluation regarding rectal pain.  She drove to [place] to visit her daughter for a few days then drove to [facility] to visit her son and during her time of travel she developed rectal pain.  She initially thought she had lower back/sacral pain while driving.  Rectal pain was if she does not pass a bowel movement within a few days.  She is passing a soft formed bowel movement most days but does not feel emptied.  She described feeling as if her "insides dropped out".  She takes MiraLAX every few days which controls her constipation without significant straining.  If she takes MiraLAX daily she has numerous BMs which is unfavorable.  He underwent pelvic floor exercise therapy in the past which was somewhat helpful. She has gained 17 pounds over the past year after her father died.  She ate a [place] food 2 weeks ago and later that evening developed nausea.  She vomited partially digested food the next day with associated left upper abdominal pain.  She took Pepcid and continue her Nexium without further vomiting and her LUQ pain abated.  She also noted having intermittent LUQ pain that comes and goes every few weeks.  No heartburn.  No dysphagia.  She stated taking Nexium on and off since age 30 but is taking this medication daily for the past 10  years.  She underwent an EGD by GI in [place] approximately 8 years ago, she does not recall the results.  Her most recent colonoscopy was 05/26/2018 which identified 5 sessile serrated polyps removed from the rectum and colon.  She was advised by Dr. Carlean Purl to repeat a colonoscopy 05/2021.  She wishes to undergo an EGD at the time of her colonoscopy.     Colonoscopy 05/26/2018: - Five 1 to 6 mm sessile serrated polyps in the rectum, in the transverse colon and at the appendiceal orifice, removed with a cold snare. Resected and retrieved. - Severe diverticulosis in the sigmoid colon. There was narrowing of the colon in association with the diverticular opening. - Mild diverticulosis in the right colon. - Internal hemorrhoids. - The examination was otherwise normal on direct and retroflexion views. - 3 year recall.  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 Referral to PT today. Suspect residual after stroke in left basal ganglia   Sleep apnea    Snoring 01/04/2019   Stroke (cerebrum) ([place]) 2017   right UE/neck sx, resolved.    Vertigo 08/11/2015   Last Assessment & Plan:  Formatting of this note might be different from the original. Concern given gradual onset over the last week and a half in addition to neck pain. Patient needs neuroimaging to rule out  dissecting vertebral aneurysm. Patient advised to go to emergency room. She declined ambulance transport   Vitamin B 12 deficiency    Vitamin D deficiency     Past Surgical History:  Procedure Laterality Date   [facility] [place] recommended   COLONOSCOPY W/ POLYPECTOMY  05/26/2018   x5 polyps   KNEE ARTHROSCOPY Bilateral 2013   meniscus  x2   NECK SURGERY  2018   disc fusion   UPPER GASTROINTESTINAL ENDOSCOPY      Prior to Admission medications   Medication Sig Start Date End Date Taking? Authorizing Provider  atorvastatin (LIPITOR) 20 MG tablet Take 1 tablet (20 mg total) by mouth daily. 04/03/20 08/19/22 Yes Kuneff, [name], DO  esomeprazole (NEXIUM) 40 MG capsule Take 1 capsule (40 mg total) by mouth daily at 12 noon. 01/15/18  Yes Kuneff, [name], DO  Magnesium 250 MG TABS Take 1 tablet by mouth daily.   Yes [provider]  Multiple Vitamins-Minerals (ALIVE WOMENS 50+ PO) Take by mouth daily.   Yes [provider]  polyethylene glycol (MIRALAX / GLYCOLAX) 17 g packet Take 17 g by mouth daily as needed.   Yes [provider]  Probiotic Product (PROBIOTIC ADVANCED) CAPS daily.    Yes [provider]  Wheat Dextrin (BENEFIBER) POWD Take 5 mg by mouth daily.   Yes [provider]  albuterol (VENTOLIN HFA) 108 (90 Base) MCG/ACT inhaler Inhale 2 puffs into the lungs every 6 (six) hours as needed for wheezing or shortness of breath. 10/30/20   Kuneff, [name], DO  Cholecalciferol 25 MCG (1000 UT) capsule Take 1,000 Units by mouth daily.    [provider]  cyanocobalamin 1000 MCG tablet Take 1,000 mcg by mouth daily.    [provider]  loratadine (CLARITIN) 10 MG tablet Take 10 mg by mouth daily as needed.    [provider]    Current Outpatient Medications  Medication Sig Dispense Refill   atorvastatin (LIPITOR) 20 MG tablet Take 1 tablet (20 mg total) by mouth daily. 90  tablet 3   esomeprazole (NEXIUM) 40 MG capsule Take 1 capsule (40 mg total) by mouth daily at 12 noon. 90 capsule 1   Magnesium 250 MG TABS Take 1 tablet by mouth daily.     Multiple Vitamins-Minerals (ALIVE WOMENS 50+ PO) Take by mouth daily.     polyethylene glycol (MIRALAX / GLYCOLAX) 17 g packet Take 17 g by mouth daily as needed.     Probiotic Product (PROBIOTIC ADVANCED) CAPS daily.      Wheat Dextrin (BENEFIBER) POWD Take 5 mg by mouth daily.     albuterol (VENTOLIN HFA) 108 (90 Base) MCG/ACT inhaler Inhale 2 puffs into the lungs every 6 (six) hours as  needed for wheezing or shortness of breath. 8 g 0   Cholecalciferol 25 MCG (1000 UT) capsule Take 1,000 Units by mouth daily.     cyanocobalamin 1000 MCG tablet Take 1,000 mcg by mouth daily.     loratadine (CLARITIN) 10 MG tablet Take 10 mg by mouth daily as needed.     Current Facility-Administered Medications  Medication Dose Route Frequency Provider Last Rate Last Admin   0.9 %  sodium chloride infusion  500 mL Intravenous Once Gatha Mayer, MD        Allergies as of 02/01/2021 - Review Complete 02/01/2021  Allergen Reaction Noted   Hydromorphone Other (See Comments) 11/19/2016   Meperidine Other (See Comments) 10/23/2012   Sulfa antibiotics Other (See Comments) 08/11/2006    Family History  Problem Relation Age of Onset   Depression Mother    Heart disease Mother    Early death Mother 6   Hypertension Mother    Thyroid disease Mother    Anxiety disorder Mother    Obesity Mother    Arthritis Father    Heart disease Father    Depression Sister 23   Early death Sister    Drug abuse Sister    Lung cancer Sister    Mental illness Sister    COPD Sister    Diabetes Maternal Grandmother    Early death Maternal Grandmother    Early death Maternal Grandfather    Heart disease Maternal Grandfather    Breast cancer Cousin    Colon cancer Neg Hx    Esophageal cancer Neg Hx    Stomach cancer Neg Hx    Pancreatic  cancer Neg Hx    Rectal cancer Neg Hx     Social History   Socioeconomic History   Marital status: Married    Spouse name: Lakesa Coste   Number of children: 3   Years of education: Not on file   Highest education level: Not on file  Occupational History   Occupation: retired Pharmacist, hospital  Tobacco Use   Smoking status: Never   Smokeless tobacco: Never  Vaping Use   Vaping Use: Never used  Substance and Sexual Activity   Alcohol use: Yes    Alcohol/week: 1.0 standard drink    Types: 1 Glasses of wine per week    Comment: 1/2-1 per day   Drug use: Never   Sexual activity: Yes    Partners: Male    Birth control/protection: None    Comment: married,intercourse age 56, less than 5 sexual partners,des neg  Other Topics Concern   Not on file  Social History Narrative   Married second family - 8 kids 79 grandkids blended   Haematologist, retired Pharmacist, hospital   Caffeine 2/day   Smoker - never   EtOH - rare   No drugs   Wears her seatbelt, smoke alarm at home.   Feels safe in her relationships.      Review of Systems:  All other review of systems negative except as mentioned in the HPI.  Physical Exam: Vital signs BP 140/75   Pulse 75   Temp (!) 97.5 F (36.4 C)   Ht 5\' 5"  (1.651 m)   Wt 175 lb (79.4 kg)   SpO2 98%   BMI 29.12 kg/m   General:   Alert,  Well-developed, well-nourished, pleasant and cooperative in NAD Lungs:  Clear throughout to auscultation.   Heart:  Regular rate and rhythm; no murmurs, clicks, rubs,  or gallops. Abdomen:  Soft,  nontender and nondistended. Normal bowel sounds.   Neuro/Psych:  Alert and cooperative. Normal mood and affect. A and O x 3   @Finnlee Silvernail  Simonne Maffucci, MD, [facility] [phone] (pager) 02/01/2021 3:21 PM@

## 2021-02-01 NOTE — Progress Notes (Signed)
To pacu, VSS. Report to [email or web address] 

## 2021-02-01 NOTE — Op Note (Signed)
[facility] Patient Name: Kayla Rosales Procedure Date: 02/01/2021 3:22 PM MRN: [id number] Endoscopist: Gatha Mayer , MD Age: 65 Referring MD:  Date of Birth: Mar 04, 1956 Gender: Female Account #: [id number] Procedure:                Upper GI endoscopy Indications:              Abdominal pain in the left upper quadrant Medicines:                Propofol per Anesthesia, Monitored Anesthesia Care Procedure:                Pre-Anesthesia Assessment:                           - Prior to the procedure, a History and Physical                            was performed, and patient medications and                            allergies were reviewed. The patient's tolerance of                            previous anesthesia was also reviewed. The risks                            and benefits of the procedure and the sedation                            options and risks were discussed with the patient.                            All questions were answered, and informed consent                            was obtained. Prior Anticoagulants: The patient has                            taken no previous anticoagulant or antiplatelet                            agents. ASA Grade Assessment: II - A patient with                            mild systemic disease. After reviewing the risks                            and benefits, the patient was deemed in                            satisfactory condition to undergo the procedure.                           After obtaining informed consent, the endoscope was  passed under direct vision. Throughout the                            procedure, the patient's blood pressure, pulse, and                            oxygen saturations were monitored continuously. The                            Endoscope was introduced through the mouth, and                            advanced to the second part of duodenum. The upper                             GI endoscopy was accomplished without difficulty.                            The patient tolerated the procedure well. Scope In: Scope Out: Findings:                 The esophagus was normal.                           The stomach was normal.                           The examined duodenum was normal.                           The cardia and gastric fundus were normal on                            retroflexion. Complications:            No immediate complications. Estimated Blood Loss:     Estimated blood loss: none. Impression:               - Normal esophagus.                           - Normal stomach.                           - Normal examined duodenum.                           - No specimens collected. Recommendation:           - Patient has a contact number available for                            emergencies. The signs and symptoms of potential                            delayed complications were discussed with the  patient. Return to normal activities tomorrow.                            Written discharge instructions were provided to the                            patient.                           - Resume previous diet.                           - Continue present medications.                           - See the other procedure note for documentation of                            additional recommendations. Gatha Mayer, MD 02/01/2021 4:28:29 PM This report has been signed electronically.

## 2021-02-01 NOTE — Op Note (Signed)
[facility] Patient Name: Kayla Rosales Procedure Date: 02/01/2021 3:21 PM MRN: [id number] Endoscopist: Gatha Mayer , MD Age: 65 Referring MD:  Date of Birth: 04-Jun-1955 Gender: Female Account #: [id number] Procedure:                Colonoscopy Indications:              Abdominal pain in the left lower quadrant, Change                            in bowel habits Medicines:                Propofol per Anesthesia, Monitored Anesthesia Care Procedure:                Pre-Anesthesia Assessment:                           - Prior to the procedure, a History and Physical                            was performed, and patient medications and                            allergies were reviewed. The patient's tolerance of                            previous anesthesia was also reviewed. The risks                            and benefits of the procedure and the sedation                            options and risks were discussed with the patient.                            All questions were answered, and informed consent                            was obtained. Prior Anticoagulants: The patient has                            taken no previous anticoagulant or antiplatelet                            agents. ASA Grade Assessment: II - A patient with                            mild systemic disease. After reviewing the risks                            and benefits, the patient was deemed in                            satisfactory condition to undergo the procedure.  After obtaining informed consent, the colonoscope                            was passed under direct vision. Throughout the                            procedure, the patient's blood pressure, pulse, and                            oxygen saturations were monitored continuously. The                            Olympus CF-HQ190L [id number]) Colonoscope was                            introduced through the  anus and advanced to the the                            cecum, identified by appendiceal orifice and                            ileocecal valve. The colonoscopy was performed with                            difficulty due to a redundant colon and significant                            looping. Successful completion of the procedure was                            aided by changing the patient to a supine position                            and applying abdominal pressure. The patient                            tolerated the procedure well. The quality of the                            bowel preparation was good. The ileocecal valve,                            appendiceal orifice, and rectum were photographed.                            Double prep was used Scope In: 3:39:21 PM Scope Out: 4:14:46 PM Scope Withdrawal Time: 0 hours 10 minutes 35 seconds  Total Procedure Duration: 0 hours 35 minutes 25 seconds  Findings:                 The perianal and digital rectal examinations were                            normal.  Three sessile polyps were found in the transverse                            colon and cecum. The polyps were 4 to 7 mm in size.                            These polyps were removed with a cold snare.                            Resection and retrieval were complete. Verification                            of patient identification for the specimen was                            done. Estimated blood loss was minimal.                           Multiple diverticula were found in the sigmoid                            colon and descending colon.                           The colon (entire examined portion) was redundant.                           The exam was otherwise without abnormality on                            direct and retroflexion views. Complications:            No immediate complications. Estimated Blood Loss:     Estimated blood loss was  minimal. Impression:               - Three 4 to 7 mm polyps in the transverse colon                            and in the cecum, removed with a cold snare.                            Resected and retrieved.                           - Diverticulosis in the sigmoid colon and in the                            descending colon.                           - Redundant colon.                           - The examination was otherwise normal on direct  and retroflexion views.                           - Personal history of colonic polyps.                           Patient was having periumbilical cramps in recovery                            - hyoscyamine adminsitered - abd was soft and mod                            tender w/o rebound Recommendation:           - Patient has a contact number available for                            emergencies. The signs and symptoms of potential                            delayed complications were discussed with the                            patient. Return to normal activities tomorrow.                            Written discharge instructions were provided to the                            patient.                           - Resume previous diet.                           - Continue present medications.                           - Await pathology results.                           - Repeat colonoscopy is recommended for                            surveillance. The colonoscopy date will be                            determined after pathology results from today's                            exam become available for review. Gatha Mayer, MD 02/01/2021 4:33:41 PM This report has been signed electronically.

## 2021-02-01 NOTE — Progress Notes (Signed)
Called to room to assist during endoscopic procedure.  Patient ID and intended procedure confirmed with present staff. Received instructions for my participation in the procedure from the performing physician.  

## 2021-02-01 NOTE — Progress Notes (Signed)
Pt's states no medical or surgical changes since previsit or office visit. 

## 2021-02-05 ENCOUNTER — Telehealth: Payer: Self-pay

## 2021-02-05 NOTE — Telephone Encounter (Signed)
First post procedure follow up call, no answer 

## 2021-02-05 NOTE — Telephone Encounter (Signed)
  Follow up Call-  Call back number 02/01/2021 05/26/2018  Post procedure Call Back phone  # [phone]  Permission to leave phone message Yes Yes  Some recent data might be hidden     Patient questions:  Do you have a fever, pain , or abdominal swelling? No. Pain Score  0 *  Have you tolerated food without any problems? No.  Have you been able to return to your normal activities? Yes.    Do you have any questions about your discharge instructions: Diet   No. Medications  No. Follow up visit  No.  Do you have questions or concerns about your Care? Yes.    Actions: * If pain score is 4 or above: Physician/ provider Notified : Silvano Rusk, MD. Date 02/05/21 at Time 1258.

## 2021-02-06 NOTE — Telephone Encounter (Signed)
I sent the patient a MyChart message with explanation

## 2021-02-08 ENCOUNTER — Encounter: Payer: Self-pay | Admitting: Internal Medicine

## 2021-02-12 ENCOUNTER — Encounter: Payer: Self-pay | Admitting: Family Medicine

## 2021-02-12 ENCOUNTER — Other Ambulatory Visit: Payer: Self-pay

## 2021-02-12 ENCOUNTER — Ambulatory Visit (INDEPENDENT_AMBULATORY_CARE_PROVIDER_SITE_OTHER): Payer: Medicare Other | Admitting: Family Medicine

## 2021-02-12 VITALS — BP 117/73 | HR 67 | Temp 98.4°F | Wt 176.0 lb

## 2021-02-12 DIAGNOSIS — J988 Other specified respiratory disorders: Secondary | ICD-10-CM

## 2021-02-12 DIAGNOSIS — B9689 Other specified bacterial agents as the cause of diseases classified elsewhere: Secondary | ICD-10-CM | POA: Diagnosis not present

## 2021-02-12 MED ORDER — PREDNISONE 50 MG PO TABS: 50.0000 mg | ORAL_TABLET | Freq: Every day | ORAL | 0 refills | Status: DC

## 2021-02-12 MED ORDER — CEFDINIR 300 MG PO CAPS: 300.0000 mg | ORAL_CAPSULE | Freq: Two times a day (BID) | ORAL | 0 refills | Status: DC

## 2021-02-12 NOTE — Progress Notes (Signed)
This visit occurred during the SARS-CoV-2 public health emergency.  Safety protocols were in place, including screening questions prior to the visit, additional usage of staff PPE, and extensive cleaning of exam room while observing appropriate contact time as indicated for disinfecting solutions.    Kayla Rosales , 10-23-1955, 65 y.o., female MRN: [id number] Patient Care Team    Relationship Specialty Notifications Start End  Ma Hillock, DO PCP - General Family Medicine  01/15/18   Elouise Munroe, MD PCP - Cardiology Cardiology Admissions 07/09/19   Gatha Mayer, MD Consulting Physician Gastroenterology  01/19/18   Princess Bruins, MD Consulting Physician Obstetrics and Gynecology  04/03/20     Chief Complaint  Patient presents with   Cough    Pt c/o cough, fatigue, SOB on exertion, sore throat, post nasal drip x 1 week; neg covid test      Subjective: Pt presents for an OV with complaints of cough, fatigue, chest tightness, sore throat, PND of 5-7 days.negative covid test.  Pt has tried mucinex dm and Claritin  to ease their symptoms.  She denies fever, chills, nausea or vomit. She report her cough became productive last night.   Depression screen [facility] 2/9 10/24/2020 04/03/2020 08/17/2019 05/20/2019 01/15/2018  Decreased Interest 0 0 1 0 0  Down, Depressed, Hopeless 0 0 1 0 0  PHQ - 2 Score 0 0 2 0 0  Altered sleeping 0 3 3 - -  Tired, decreased energy 0 3 3 - -  Change in appetite 0 0 1 - -  Feeling bad or failure about yourself  0 0 1 - -  Trouble concentrating 0 0 1 - -  Moving slowly or fidgety/restless 0 0 2 - -  Suicidal thoughts 0 0 0 - -  PHQ-9 Score 0 6 13 - -  Difficult doing work/chores Not difficult at all - Somewhat difficult - -    Allergies  Allergen Reactions   Hydromorphone Other (See Comments)    HYPOTENSION   Meperidine Other (See Comments)    "BP drops"   Sulfa Antibiotics Other (See Comments)    States she does not know the reaction     Social History   Social History Narrative   Married second family - 8 kids 56 grandkids blended   Haematologist, retired Pharmacist, hospital   Caffeine 2/day   Smoker - never   EtOH - rare   No drugs   Wears her seatbelt, smoke alarm at home.   Feels safe in her relationships.   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 Referral to PT today. 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    Vertigo 08/11/2015   Last Assessment & Plan:  Formatting of this note might be different from the original. Concern given gradual onset over the last week and a half in addition to neck pain. Patient needs neuroimaging to rule out dissecting vertebral aneurysm. Patient advised to go to emergency room. She declined ambulance transport   Vitamin B 12 deficiency    Vitamin D deficiency    Past Surgical History:  Procedure Laterality Date   [facility] [place] [place] recommended   COLONOSCOPY W/ POLYPECTOMY  05/26/2018   x5 polyps   KNEE ARTHROSCOPY Bilateral 2013   meniscus  x2   NECK SURGERY  2018   disc fusion   UPPER GASTROINTESTINAL ENDOSCOPY     Family History  Problem Relation Age of Onset   Depression Mother    Heart disease Mother    Early death Mother 42   Hypertension Mother     Thyroid disease Mother    Anxiety disorder Mother    Obesity Mother    Arthritis Father    Heart disease Father    Depression Sister 51   Early death Sister    Drug abuse Sister    Lung cancer Sister    Mental illness Sister    COPD Sister    Diabetes Maternal Grandmother    Early death Maternal Grandmother    Early death Maternal Grandfather    Heart disease Maternal Grandfather    Breast cancer Cousin    Colon cancer Neg Hx    Esophageal cancer Neg Hx    Stomach cancer Neg Hx    Pancreatic cancer Neg Hx    Rectal cancer Neg Hx    Allergies as of 02/12/2021       Reactions   Hydromorphone Other (See Comments)   HYPOTENSION   Meperidine Other (See Comments)   "BP drops"   Sulfa Antibiotics Other (See Comments)   States she does not know the reaction        Medication List        Accurate as of February 12, 2021  3:21 PM. If you have any questions, ask your nurse or doctor.          albuterol 108 (90 Base) MCG/ACT inhaler Commonly known as: VENTOLIN HFA Inhale 2 puffs into the lungs every 6 (six) hours as needed for wheezing or shortness of breath.   ALIVE WOMENS 50+ PO Take by mouth daily.   atorvastatin 20 MG tablet Commonly known as: LIPITOR Take 1 tablet (20 mg total) by mouth daily.   Benefiber Powd Take 5 mg by mouth daily.   cefdinir 300 MG capsule Commonly known as: OMNICEF Take 1 capsule (300 mg total) by mouth 2 (two) times daily. Started by: Howard Pouch, DO   Cholecalciferol 25 MCG (1000 UT) capsule Take 1,000 Units by mouth daily.   cyanocobalamin 1000 MCG tablet Take 1,000 mcg by mouth daily.   esomeprazole 40 MG capsule Commonly known as: NEXIUM Take 1 capsule (40 mg total) by mouth daily at 12 noon.   loratadine 10 MG tablet Commonly known as: CLARITIN Take 10 mg by mouth daily as needed.   Magnesium 250 MG Tabs Take 1 tablet by mouth daily.   polyethylene glycol 17 g packet Commonly known as: MIRALAX / GLYCOLAX Take  17 g by mouth daily as needed.   predniSONE 50 MG tablet Commonly known as: DELTASONE Take 1  tablet (50 mg total) by mouth daily with breakfast. Started by: Howard Pouch, DO   Probiotic Advanced Caps daily.        All past medical history, surgical history, allergies, family history, immunizations andmedications were updated in the EMR today and reviewed under the history and medication portions of their EMR.     ROS: Negative, with the exception of above mentioned in HPI   Objective:  BP 117/73   Pulse 67   Temp 98.4 F (36.9 C) (Oral)   Wt 176 lb (79.8 kg)   SpO2 96%   BMI 29.29 kg/m  Body mass index is 29.29 kg/m. Gen: Afebrile. No acute distress. Nontoxic in appearance, well developed, well nourished. Very pleasant female.  HENT: AT. . Bilateral TM visualized mild bilateral pink tone. MMM, no oral lesions. Bilateral nares with mild erythema and drainage. Throat without erythema or exudates. Deep cough present. No shortness of breath.  Eyes:Pupils Equal Round Reactive to light, Extraocular movements intact,  Conjunctiva without redness, discharge or icterus. Neck/lymp/endocrine: Supple, no lymphadenopathy CV: RRR Chest: CTAB, no wheeze or crackles. Mild rhonchi- cleared with cough. Good air movement, normal resp effort.  Abd: Soft. NTND. BS present Skin: no rashes, purpura or petechiae.  Neuro: Normal gait. PERLA. EOMi. Alert. Oriented x3  Psych: Normal affect, dress and demeanor. Normal speech. Normal thought content and judgment.  No results found. No results found. No results found for this or any previous visit (from the past 24 hour(s)).  Assessment/Plan: Kayla Rosales is a 65 y.o. female present for OV for  Bacterial respiratory infection Rest, hydrate.  +/- flonase, mucinex (DM if cough), nettie pot or nasal saline.  Omnicef and prednisone prescribed, take until completed.  If cough present it can last up to 6-8 weeks.  F/U 2 weeks of not improved.     Reviewed expectations re: course of current medical issues. Discussed self-management of symptoms. Outlined signs and symptoms indicating need for more acute intervention. Patient verbalized understanding and all questions were answered. Patient received an After-Visit Summary.    No orders of the defined types were placed in this encounter.  Meds ordered this encounter  Medications   predniSONE (DELTASONE) 50 MG tablet    Sig: Take 1 tablet (50 mg total) by mouth daily with breakfast.    Dispense:  5 tablet    Refill:  0   cefdinir (OMNICEF) 300 MG capsule    Sig: Take 1 capsule (300 mg total) by mouth 2 (two) times daily.    Dispense:  20 capsule    Refill:  0   Referral Orders  No referral(s) requested today     Note is dictated utilizing voice recognition software. Although note has been proof read prior to signing, occasional typographical errors still can be missed. If any questions arise, please do not hesitate to call for verification.   electronically signed by:  Howard Pouch, DO  [place]

## 2021-02-12 NOTE — Patient Instructions (Signed)
Acute Bronchitis, Adult  Acute bronchitis is when air tubes in the lungs (bronchi) suddenly get swollen. The condition can make it hard for you to breathe. In adults, acute bronchitis usually goes away within 2 weeks. A cough caused by bronchitis may last up to 3 weeks. Smoking, allergies, and asthma can make thecondition worse. What are the causes? This condition is caused by: Cold and flu viruses. The most common cause of this condition is the virus that causes the common cold. Bacteria. Substances that irritate the lungs, including: Smoke from cigarettes and other types of tobacco. Dust and pollen. Fumes from chemicals, gases, or burned fuel. Other materials that pollute indoor or outdoor air. Close contact with someone who has acute bronchitis. What increases the risk? The following factors may make you more likely to develop this condition: A weak body's defense system. This is also called the immune system. Any condition that affects your lungs and breathing, such as asthma. What are the signs or symptoms? Symptoms of this condition include: A cough. Coughing up clear, yellow, or green mucus. Wheezing. Having too much mucus in your lungs (chest congestion). Shortness of breath. A fever. Chills. Body aches. A sore throat. How is this treated? Acute bronchitis may go away over time without treatment. Your doctor may recommend: Drinking more fluids. Using a device that gets medicine into your lungs (inhaler). Using a vaporizer or a humidifier. These are machines that add water or moisture to the air. This helps with coughing and poor breathing. Taking a medicine for fever. Taking a medicine that thins mucus and clears congestion. Taking a medicine that prevents or stops coughing. Follow these instructions at home: Activity Get a lot of rest. Return to your normal activities as told by your doctor. Ask your doctor what activities are safe for you. Lifestyle  Drink enough  fluid to keep your pee (urine) pale yellow. Do not drink alcohol. Do not use any products that contain nicotine or tobacco, such as cigarettes, e-cigarettes, and chewing tobacco. If you need help quitting, ask your doctor. Be aware that: Your bronchitis will get worse if you smoke or breathe in other people's smoke (secondhand smoke). Your lungs will heal faster if you quit smoking.  General instructions Take over-the-counter and prescription medicines only as told by your doctor. Use an inhaler, cool mist vaporizer, or humidifier as told by your doctor. Rinse your mouth often with salt water. To make salt water, dissolve -1 tsp (3-6 g) of salt in 1 cup (237 mL) of warm water. Take two teaspoons of honey at bedtime. This helps lessen your coughing at night. Keep all follow-up visits as told by your doctor. This is important. How is this prevented? To lower your risk of getting this condition again: Wash your hands often with soap and water. If you cannot use soap and water, use hand sanitizer. Avoid contact with people who have cold symptoms. Try not to touch your mouth, nose, or eyes with your hands. Make sure to get the flu shot every year. Contact a doctor if: Your symptoms do not get better in 2 weeks. You vomit more than once or twice. You have symptoms of loss of fluid from your body (dehydration). These include: Dark pee. Dry skin or eyes. Increased thirst. Headaches. Confusion. Muscle cramps. Get help right away if: You cough up blood. You have chest pain. You have very bad shortness of breath. You become dehydrated. You faint or keep feeling like you are going to faint. You   have a very bad headache. Your fever or chills get worse. These symptoms may be an emergency. Get help right away. Call your local emergency services (911 in the [place] Do not wait to see if the symptoms will go away. Do not drive yourself to the hospital. Summary Acute bronchitis is when air  tubes in the lungs (bronchi) suddenly get swollen. In adults, acute bronchitis usually goes away within 2 weeks. Take over-the-counter and prescription medicines only as told by your doctor. Drink enough fluid to keep your pee (urine) pale yellow. Contact a doctor if your symptoms do not improve after 2 weeks of treatment. Get help right away if you cough up blood, faint, or have chest pain or shortness of breath. This information is not intended to replace advice given to you by your health care provider. Make sure you discuss any questions you have with your healthcare provider. Document Revised: 03/01/2020 Document Reviewed: 10/23/2018 Elsevier Patient Education  2022 Elsevier Inc.  

## 2021-02-16 ENCOUNTER — Encounter: Payer: Self-pay | Admitting: Family Medicine

## 2021-02-16 NOTE — Telephone Encounter (Signed)
Please advise on poss side effect 

## 2021-02-21 ENCOUNTER — Ambulatory Visit: Payer: Medicare Other | Admitting: Physical Therapy

## 2021-03-19 ENCOUNTER — Encounter: Payer: Self-pay | Admitting: Family Medicine

## 2021-03-19 ENCOUNTER — Other Ambulatory Visit: Payer: Self-pay

## 2021-03-19 ENCOUNTER — Ambulatory Visit (INDEPENDENT_AMBULATORY_CARE_PROVIDER_SITE_OTHER): Payer: Medicare Other | Admitting: Family Medicine

## 2021-03-19 VITALS — BP 114/67 | HR 68 | Temp 97.7°F | Ht 63.78 in | Wt 176.0 lb

## 2021-03-19 DIAGNOSIS — M899 Disorder of bone, unspecified: Secondary | ICD-10-CM | POA: Diagnosis not present

## 2021-03-19 DIAGNOSIS — Z8673 Personal history of transient ischemic attack (TIA), and cerebral infarction without residual deficits: Secondary | ICD-10-CM | POA: Diagnosis not present

## 2021-03-19 DIAGNOSIS — Z1231 Encounter for screening mammogram for malignant neoplasm of breast: Secondary | ICD-10-CM

## 2021-03-19 DIAGNOSIS — R7309 Other abnormal glucose: Secondary | ICD-10-CM

## 2021-03-19 DIAGNOSIS — E669 Obesity, unspecified: Secondary | ICD-10-CM | POA: Diagnosis not present

## 2021-03-19 DIAGNOSIS — M8589 Other specified disorders of bone density and structure, multiple sites: Secondary | ICD-10-CM

## 2021-03-19 DIAGNOSIS — Z23 Encounter for immunization: Secondary | ICD-10-CM

## 2021-03-19 DIAGNOSIS — Z0000 Encounter for general adult medical examination without abnormal findings: Secondary | ICD-10-CM | POA: Diagnosis not present

## 2021-03-19 DIAGNOSIS — K219 Gastro-esophageal reflux disease without esophagitis: Secondary | ICD-10-CM

## 2021-03-19 DIAGNOSIS — E785 Hyperlipidemia, unspecified: Secondary | ICD-10-CM | POA: Diagnosis not present

## 2021-03-19 DIAGNOSIS — Z86010 Personal history of colonic polyps: Secondary | ICD-10-CM | POA: Diagnosis not present

## 2021-03-19 DIAGNOSIS — Z6830 Body mass index (BMI) 30.0-30.9, adult: Secondary | ICD-10-CM

## 2021-03-19 DIAGNOSIS — J302 Other seasonal allergic rhinitis: Secondary | ICD-10-CM

## 2021-03-19 DIAGNOSIS — Z803 Family history of malignant neoplasm of breast: Secondary | ICD-10-CM | POA: Diagnosis not present

## 2021-03-19 DIAGNOSIS — D7589 Other specified diseases of blood and blood-forming organs: Secondary | ICD-10-CM | POA: Diagnosis not present

## 2021-03-19 LAB — HEMOGLOBIN A1C: Hgb A1c MFr Bld: 5.4 % (ref 4.6–6.5)

## 2021-03-19 LAB — LIPID PANEL
Cholesterol: 129 mg/dL (ref 0–200)
HDL: 46.3 mg/dL (ref >39.00)
LDL Cholesterol: 65 mg/dL (ref 0–99)
NonHDL: 82.76
Total CHOL/HDL Ratio: 3
Triglycerides: 90 mg/dL (ref 0.0–149.0)
VLDL: 18 mg/dL (ref 0.0–40.0)

## 2021-03-19 LAB — VITAMIN D 25 HYDROXY (VIT D DEFICIENCY, FRACTURES): VITD: 31.53 ng/mL (ref 30.00–100.00)

## 2021-03-19 LAB — TSH: TSH: 1.34 u[IU]/mL (ref 0.35–5.50)

## 2021-03-19 MED ORDER — ATORVASTATIN CALCIUM 20 MG PO TABS: 20.0000 mg | ORAL_TABLET | Freq: Every day | ORAL | 3 refills | Status: DC

## 2021-03-19 MED ORDER — ESOMEPRAZOLE MAGNESIUM 40 MG PO CPDR: 40.0000 mg | DELAYED_RELEASE_CAPSULE | Freq: Every day | ORAL | 3 refills | Status: DC

## 2021-03-19 NOTE — Patient Instructions (Addendum)
You completed your medicare wellness today.  Your last Pneumonia vaccine is due AFTER 04/04/2021. Please make a nurse visit appt to have this completed.     Health Maintenance After Age 66 After age 73, you are at a higher risk for certain long-term diseases and infections as well as injuries from falls. Falls are a major cause of broken bones and head injuries in people who are older than age 60. Getting regular preventive care can help to keep you healthy and well. Preventive care includes getting regular testing and making lifestyle changes as recommended by your health care provider. Talk with your health care provider about: Which screenings and tests you should have. A screening is a test that checks for a disease when you have no symptoms. A diet and exercise plan that is right for you. What should I know about screenings and tests to prevent falls? Screening and testing are the best ways to find a health problem early. Early diagnosis and treatment give you the best chance of managing medical conditions that are common after age 25. Certain conditions and lifestyle choices may make you more likely to have a fall. Your health care provider may recommend: Regular vision checks. Poor vision and conditions such as cataracts can make you more likely to have a fall. If you wear glasses, make sure to get your prescription updated if your vision changes. Medicine review. Work with your health care provider to regularly review all of the medicines you are taking, including over-the-counter medicines. Ask your health care provider about any side effects that may make you more likely to have a fall. Tell your health care provider if any medicines that you take make you feel dizzy or sleepy. Strength and balance checks. Your health care provider may recommend certain tests to check your strength and balance while standing, walking, or changing positions. Foot health exam. Foot pain and numbness, as well  as not wearing proper footwear, can make you more likely to have a fall. Screenings, including: Osteoporosis screening. Osteoporosis is a condition that causes the bones to get weaker and break more easily. Blood pressure screening. Blood pressure changes and medicines to control blood pressure can make you feel dizzy. Depression screening. You may be more likely to have a fall if you have a fear of falling, feel depressed, or feel unable to do activities that you used to do. Alcohol use screening. Using too much alcohol can affect your balance and may make you more likely to have a fall. Follow these instructions at home: Lifestyle Do not drink alcohol if: Your health care provider tells you not to drink. If you drink alcohol: Limit how much you have to: 0-1 drink a day for women. 0-2 drinks a day for men. Know how much alcohol is in your drink. In the [place] one drink equals one 12 oz bottle of beer (355 mL), one 5 oz glass of wine (148 mL), or one 1 oz glass of hard liquor (44 mL). Do not use any products that contain nicotine or tobacco. These products include cigarettes, chewing tobacco, and vaping devices, such as e-cigarettes. If you need help quitting, ask your health care provider. Activity  Follow a regular exercise program to stay fit. This will help you maintain your balance. Ask your health care provider what types of exercise are appropriate for you. If you need a cane or walker, use it as recommended by your health care provider. Wear supportive shoes that have nonskid soles. Safety  Remove any tripping hazards, such as rugs, cords, and clutter. Install safety equipment such as grab bars in bathrooms and safety rails on stairs. Keep rooms and walkways well-lit. General instructions Talk with your health care provider about your risks for falling. Tell your health care provider if: You fall. Be sure to tell your health care provider about all falls, even ones that seem  minor. You feel dizzy, tiredness (fatigue), or off-balance. Take over-the-counter and prescription medicines only as told by your health care provider. These include supplements. Eat a healthy diet and maintain a healthy weight. A healthy diet includes low-fat dairy products, low-fat (lean) meats, and fiber from whole grains, beans, and lots of fruits and vegetables. Stay current with your vaccines. Schedule regular health, dental, and eye exams. Summary Having a healthy lifestyle and getting preventive care can help to protect your health and wellness after age 70. Screening and testing are the best way to find a health problem early and help you avoid having a fall. Early diagnosis and treatment give you the best chance for managing medical conditions that are more common for people who are older than age 46. Falls are a major cause of broken bones and head injuries in people who are older than age 87. Take precautions to prevent a fall at home. Work with your health care provider to learn what changes you can make to improve your health and wellness and to prevent falls. This information is not intended to replace advice given to you by your health care provider. Make sure you discuss any questions you have with your health care provider. Document Revised: 08/21/2020 Document Reviewed: 08/21/2020 Elsevier Patient Education  [place].

## 2021-03-19 NOTE — Progress Notes (Signed)
This visit occurred during the SARS-CoV-2 public health emergency.  Safety protocols were in place, including screening questions prior to the visit, additional usage of staff PPE, and extensive cleaning of exam room while observing appropriate contact time as indicated for disinfecting solutions.     Medicare AWV subsequent  Patient ID: Kayla Rosales, female  DOB: 1956/03/19, 65 y.o.   MRN: [id number] Patient Care Team    Relationship Specialty Notifications Start End  Ma Hillock, DO PCP - General Family Medicine  01/15/18   Elouise Munroe, MD PCP - Cardiology Cardiology Admissions 07/09/19   Gatha Mayer, MD Consulting Physician Gastroenterology  01/19/18   Princess Bruins, MD Consulting Physician Obstetrics and Gynecology  04/03/20     Chief Complaint  Patient presents with   Medicare Wellness    Pt is fasting;     Subjective: Kayla Rosales is a 65 y.o.  Female  present for subsequent medicare wellness and chronic medical conditions. .  All past medical history, surgical history, allergies, family history, immunizations, medications and social history were updated in the electronic medical record today. All recent labs, ED visits and hospitalizations within the last year were reviewed.  Health maintenance:  Colonoscopy: h/o colon polyps.completed 01/2021, by Dr. Carlean Purl. follow up 3 yr. Mammogram: completed:05/17/2020, [facility] center [facility]-ordered > ordered for her today Cervical cancer screening: last pap:03/2019- GYN Immunizations: tdap UTD 01/2016, Influenza UTD -today(encouraged yearly), PNA series -prevnar completed pna23 due 04/03/2021, shingrix completed, covid moderna series completed w/ booster.  Infectious disease screening: HIV and Hep C completed DEXA: last completed 05/2020-osteopenia- ordered by gyn Assistive device: none Oxygen MHD:QQIW Patient has a Dental home. Hospitalizations/ED visits: reviewed Glaucoma screen: N/A No results found. Hearing: Whisper  test, no barriers identified.   Hyperlipidemia/h/o stroke/murmur:  Patient suffered from a stroke in 2017.  Since that time she has been on atorvastatin 20 mg qd. She is compliant with medication.  AAA screen: completed if female 51- 60 and if ever smoked and patient agreed to screening> never smoker Alcohol abuse screening: completed if indicated STIs screening: Completed if indicated  Cognitive assessment:  Word recall (daisy, blue, church): 3/3  Exercise: does not exercise routinely.    Diet: Regular  Functional Status Survey: Get up and go test: steady and less than 20 seconds.      Depression screen [facility] 2/9 03/19/2021 10/24/2020 04/03/2020 08/17/2019 05/20/2019  Decreased Interest 0 0 0 1 0  Down, Depressed, Hopeless 0 0 0 1 0  PHQ - 2 Score 0 0 0 2 0  Altered sleeping 1 0 3 3 -  Tired, decreased energy 1 0 3 3 -  Change in appetite 0 0 0 1 -  Feeling bad or failure about yourself  0 0 0 1 -  Trouble concentrating 0 0 0 1 -  Moving slowly or fidgety/restless 0 0 0 2 -  Suicidal thoughts 0 0 0 0 -  PHQ-9 Score 2 0 6 13 -  Difficult doing work/chores - Not difficult at all - Somewhat difficult -    Advanced Care Planning: N Code status on file: no [place] information provided: no Advanced care directive scanned into chart: no  Depression screen [facility] 2/9 03/19/2021 10/24/2020 04/03/2020 08/17/2019 05/20/2019  Decreased Interest 0 0 0 1 0  Down, Depressed, Hopeless 0 0 0 1 0  PHQ - 2 Score 0 0 0 2 0  Altered sleeping 1 0 3 3 -  Tired, decreased energy 1  0 3 3 -  Change in appetite 0 0 0 1 -  Feeling bad or failure about yourself  0 0 0 1 -  Trouble concentrating 0 0 0 1 -  Moving slowly or fidgety/restless 0 0 0 2 -  Suicidal thoughts 0 0 0 0 -  PHQ-9 Score 2 0 6 13 -  Difficult doing work/chores - Not difficult at all - Somewhat difficult -   GAD 7 : Generalized Anxiety Score 03/19/2021  Nervous, Anxious, on Edge 0  Control/stop worrying 0  Worry too  much - different things 1  Trouble relaxing 0  Restless 0  Easily annoyed or irritable 0  Afraid - awful might happen 0  Total GAD 7 Score 1     Immunization History  Administered Date(s) Administered   Fluad Quad(high Dose 65+) 03/27/2020, 03/19/2021   Influenza Inj Mdck Quad Pf 03/27/2017   Influenza, High Dose Seasonal PF 01/15/2018   Influenza, Quadrivalent, Recombinant, Inj, Pf 02/16/2019   Influenza, Seasonal, Injecte, Preservative Fre 03/27/2017   Influenza,inj,Quad PF,6+ Mos 02/16/2019   Influenza-Unspecified 01/21/2016, 02/16/2019   Moderna Sars-Covid-2 Vaccination 06/10/2019, 07/09/2019, 03/02/2020   Pneumococcal Conjugate-13 04/03/2020   Pneumococcal Polysaccharide-23 08/13/2015   Tdap 12/30/2011, 01/21/2016   Zoster Recombinat (Shingrix) 02/16/2019, 04/22/2019    Past Medical History:  Diagnosis Date   Allergy    Arthritis    Asthma    Back pain    Benign positional vertigo    Chicken pox    Colon polyps    Cough 10/09/2020   COVID-19 10/09/2020   Diverticulitis    Early menopause occurring in patient age younger than 47 years    Fibromyalgia    GERD (gastroesophageal reflux disease)    Gluten intolerance    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 Still unclear what is cuasing her pain. May be nerve related. May benefit from starting on gabapentin. Start gabapentin 300 mg TID. F/u 4 weeks   Night sweats    Palpitations    Right arm weakness 09/28/2015   Last Assessment & Plan:  Formatting of this note might be different from the original. Referral to PT today. Suspect residual after stroke in left basal ganglia   Sleep apnea    Snoring 01/04/2019   Stroke (cerebrum) ([place]) 2017   right UE/neck sx, resolved.    Vertigo 08/11/2015    Last Assessment & Plan:  Formatting of this note might be different from the original. Concern given gradual onset over the last week and a half in addition to neck pain. Patient needs neuroimaging to rule out dissecting vertebral aneurysm. Patient advised to go to emergency room. She declined ambulance transport   Vitamin B 12 deficiency    Vitamin D deficiency    Allergies  Allergen Reactions   Hydromorphone Other (See Comments)    HYPOTENSION   Meperidine Other (See Comments)    "BP drops"   Sulfa Antibiotics Other (See Comments)    States she does not know the reaction    Past Surgical History:  Procedure Laterality Date   [facility] [place]   COLONOSCOPY W/ POLYPECTOMY  05/26/2018   x5 polyps   KNEE ARTHROSCOPY  Bilateral 2013   meniscus  x2   NECK SURGERY  2018   disc fusion   UPPER GASTROINTESTINAL ENDOSCOPY     Family History  Problem Relation Age of Onset   Depression Mother    Heart disease Mother    Early death Mother 18   Hypertension Mother    Thyroid disease Mother    Anxiety disorder Mother    Obesity Mother    Arthritis Father    Heart disease Father    Depression Sister 57   Early death Sister    Drug abuse Sister    Lung cancer Sister    Mental illness Sister    COPD Sister    Diabetes Maternal Grandmother    Early death Maternal Grandmother    Early death Maternal Grandfather    Heart disease Maternal Grandfather    Breast cancer Cousin    Colon cancer Neg Hx    Esophageal cancer Neg Hx    Stomach cancer Neg Hx    Pancreatic cancer Neg Hx    Rectal cancer Neg Hx    Social History   Social History Narrative   Married second family - 20 kids 84 grandkids blended   Haematologist, retired Pharmacist, hospital   Caffeine 2/day   Smoker - never   EtOH - rare   No drugs   Wears her seatbelt, smoke alarm at home.   Feels safe in her relationships.    Allergies as of 03/19/2021       Reactions    Hydromorphone Other (See Comments)   HYPOTENSION   Meperidine Other (See Comments)   "BP drops"   Sulfa Antibiotics Other (See Comments)   States she does not know the reaction        Medication List        Accurate as of March 19, 2021  9:50 AM. If you have any questions, ask your nurse or doctor.          STOP taking these medications    cefdinir 300 MG capsule Commonly known as: OMNICEF Stopped by: Howard Pouch, DO   predniSONE 50 MG tablet Commonly known as: DELTASONE Stopped by: Howard Pouch, DO       TAKE these medications    albuterol 108 (90 Base) MCG/ACT inhaler Commonly known as: VENTOLIN HFA Inhale 2 puffs into the lungs every 6 (six) hours as needed for wheezing or shortness of breath.   ALIVE WOMENS 50+ PO Take by mouth daily.   atorvastatin 20 MG tablet Commonly known as: LIPITOR Take 1 tablet (20 mg total) by mouth daily.   Benefiber Powd Take 5 mg by mouth daily.   Cholecalciferol 25 MCG (1000 UT) capsule Take 1,000 Units by mouth daily.   cyanocobalamin 1000 MCG tablet Take 1,000 mcg by mouth daily.   esomeprazole 40 MG capsule Commonly known as: NEXIUM Take 1 capsule (40 mg total) by mouth daily at 12 noon.   loratadine 10 MG tablet Commonly known as: CLARITIN Take 10 mg by mouth daily as needed.   Magnesium 250 MG Tabs Take 1 tablet by mouth daily.   polyethylene glycol 17 g packet Commonly known as: MIRALAX / GLYCOLAX Take 17 g by mouth daily as needed.   Probiotic Advanced Caps daily.        All past medical history, surgical history, allergies, family history, immunizations andmedications were updated in the EMR today and reviewed under the history and medication portions of their EMR.  CT ABDOMEN PELVIS W CONTRAST  Result Date: 01/04/2021  IMPRESSION: 1. No acute abdominopelvic findings. 2. Colonic diverticulosis without findings of acute diverticulitis. 3. Small volume of formed stool in the colon. 4.   Aortic Atherosclerosis (ICD10-I70.0). Electronically Signed   By: Dahlia Bailiff M.D.   On: 01/04/2021 12:40     ROS: 14 pt review of systems performed and negative (unless mentioned in an HPI)  Objective: BP 114/67   Pulse 68   Temp 97.7 F (36.5 C) (Oral)   Ht 5' 3.78" (1.62 m)   Wt 176 lb (79.8 kg)   SpO2 97%   BMI 30.42 kg/m  Gen: Afebrile. No acute distress. Nontoxic in appearance, well-developed, well-nourished,  very pleasant female.  HENT: AT. [place]. Bilateral TM visualized and normal in appearance, normal external auditory canal. MMM, no oral lesions, adequate dentition. Bilateral nares within normal limits. Throat without erythema, ulcerations or exudates. no Cough on exam, no hoarseness on exam. Eyes:Pupils Equal Round Reactive to light, Extraocular movements intact,  Conjunctiva without redness, discharge or icterus. Neck/lymp/endocrine: Supple,no lymphadenopathy, no thyromegaly CV: RRR no murmur, no edema, +2/4 P posterior tibialis pulses.  Chest: CTAB, no wheeze, rhonchi or crackles. normal Respiratory effort. good Air movement. Abd: Soft. flat. NTND. BS present. no Masses palpated. No hepatosplenomegaly. No rebound tenderness or guarding. Skin: no rashes, purpura or petechiae. Warm and well-perfused. Skin intact. Neuro/Msk: Normal gait. PERLA. EOMi. Alert. Oriented x3.  Cranial nerves II through XII intact. Muscle strength 5/5 upper/lower extremity. DTRs equal bilaterally. Psych: Normal affect, dress and demeanor. Normal speech. Normal thought content and judgment.   No results found.  Assessment/plan: Alyanna Stoermer is a 65 y.o. female present for CPE  Hyperlipidemia LDL goal <100/ History of stroke/obesity Continue atrovastatin 20 mg qd. Diet and exercise.  - TSH - lipid panel  Need for influenza vaccination - Flu Vaccine QUAD High Dose(Fluad)  Gastroesophageal reflux disease, unspecified whether esophagitis present Stable.  Continue nexium.   History of  colonic polyp Up to date- due 2025  Osteopenia of multiple sites/vit d/d/o bone/Disorder of bone - Vitamin D (25 hydroxy) Dexa due 2025 Breast cancer screening by mammogram/Family history of malignant neoplasm of breast - MM 3D SCREEN BREAST BILATERAL; Future Elevated glucose - Hemoglobin A1c  Medicare annual wellness visit, subsequent Patient was encouraged to exercise greater than 150 minutes a week. Patient was encouraged to choose a diet filled with fresh fruits and vegetables, and lean meats. AVS provided to patient today for education/recommendation on gender specific health and safety maintenance. Health maintenance UTD and reviewed .  Return in about 1 year (around 03/20/2022) for Medicare Wellness (87m). W/ [facility]  Orders Placed This Encounter  Procedures   MM 3D SCREEN BREAST BILATERAL   Flu Vaccine QUAD High Dose(Fluad)   Lipid panel   TSH   Hemoglobin A1c   Vitamin D (25 hydroxy)   Ambulatory referral to Allergy   Meds ordered this encounter  Medications   esomeprazole (NEXIUM) 40 MG capsule    Sig: Take 1 capsule (40 mg total) by mouth daily at 12 noon.    Dispense:  90 capsule    Refill:  3   atorvastatin (LIPITOR) 20 MG tablet    Sig: Take 1 tablet (20 mg total) by mouth daily.    Dispense:  90 tablet    Refill:  3    Referral Orders         Ambulatory referral to Allergy       Electronically  signed by: Howard Pouch, DO [place]

## 2021-03-22 ENCOUNTER — Telehealth: Payer: Self-pay | Admitting: Family Medicine

## 2021-03-23 ENCOUNTER — Ambulatory Visit: Payer: Self-pay | Admitting: Obstetrics & Gynecology

## 2021-03-23 NOTE — Telephone Encounter (Signed)
Spoke with pt regarding labs and instructions.   

## 2021-03-26 NOTE — Progress Notes (Signed)
New Patient Note  RE: Kayla Rosales MRN: [id number] DOB: [birth year] Date of Office Visit: 03/27/2021  Consult requested by: Ma Hillock, DO Primary care provider: Ma Hillock, DO  Chief Complaint: Allergic Rhinitis  (Years age was allergy tested and it came back positive for dust mites- was on allergy shot for dust. She would get frequent sinus infections and asthma flares in the past.  Constant drainage and the fall is when her allergies are the worse. ), Asthma, Other (This summer had pneumonia with covid and since then her asthma as gotten worse. ), and Allergic Reaction (Has issues with her digestive system and a twisted colon - she avoids gluten products. Not sure all the food that causes her to become severely blocked. )  History of Present Illness: I had the pleasure of seeing Kayla Rosales for initial evaluation at the [facility] on 03/28/2021. She is a 65 y.o. female, who is referred here by Howard Pouch A, DO for the evaluation of allergic rhinitis and asthma.  Rhinitis:  She reports symptoms of nasal congestion, PND, coughing. Symptoms have been going on for many years but worse this year. The symptoms are present all year around with worsening in the fall and winter. Other triggers include exposure to ironing/sewing. Anosmia: no. Headache: sometimes. She has used Claritin, Flonase 2 sprays per nostril with fair improvement in symptoms. No epistaxis.   Sinus infections: 1-2. Previous work up includes: skin testing years ago was positive to dust mites and was on AIT for 1.5 years with some benefit. Previous ENT evaluation: yes in the past - deviated septum. No prior sinus surgery. Previous sinus imaging: no. History of nasal polyps: not recently. History of reflux: takes Nexium daily.  Asthma: She reports symptoms of chest tightness, shortness of breath, coughing, wheezing for 1 years. Current medications include albuterol prn which help. She reports not  using aerochamber with inhalers. She tried the following inhalers: some type of steroid inhaler in the past. Main triggers are unknown but worse when working with her Systems analyst and concerned about food allergies especially when eating out.   In the last month, frequency of symptoms: 1x/week. Frequency of nocturnal symptoms: 0x/month. Frequency of SABA use: 1x/week. In the last 12 months, emergency room visits/urgent care visits/doctor office visits or hospitalizations due to respiratory issues: 3. In the last 12 months, oral steroids courses: 3. Lifetime history of hospitalization for respiratory issues: no. Prior intubations: no.  History of pneumonia: yes. She was evaluated by allergist in the past. Smoking exposure: no. Up to date with flu vaccine: yes. Up to date with pneumonia vaccine: not yet. Up to date with COVID-19 vaccine: yes. Prior Covid-19 infection: yes in the summer 2022.  Chest X-ray 12/02/2020: "FINDINGS: Heart is normal size. Linear scarring at the left base. No acute confluent opacities. Calcified granulomas in the left upper lobe. No effusions or acute bony abnormality.   IMPRESSION: No acute cardiopulmonary disease."  Assessment and Plan: Kayla Rosales is a 65 y.o. female with: Chronic rhinitis Perennial rhinitis symptoms with worsening in the fall and winter.  Triggers include ironing and sewing.  Tried Claritin and Flonase with some benefit.  Past skin testing was positive to dust mite and was on AIT for 1.5 years with good benefit.  ENT evaluation showed deviated septum, no prior sinus surgeries. Today's skin prick testing showed: Negative to indoor/outdoor allergens however the positive control was borderline positive questioning the validity of the results.  Will  get bloodwork instead of intradermal testing.  Use Flonase (fluticasone) nasal spray 1 spray per nostril twice a day as needed for nasal congestion.  Use azelastine nasal spray 1-2 sprays per nostril twice a day  as needed for runny nose/drainage. Nasal saline spray (i.e., Simply Saline) or nasal saline lavage (i.e., NeilMed) is recommended as needed and prior to medicated nasal sprays.  Asthma Chest tightness, shortness of breath, coughing and wheezing for the past 1 year.  COVID-19 in the summer 2022.  Using albuterol once a week with good benefit.  2022 chest x-ray showed calcified granulomas in the past. Today's spirometry showed: normal pattern with no improvement in FEV1 post bronchodilator treatment. Clinically feeling unchanged.  Daily controller medication(s): none During upper respiratory infections/asthma flares:  Start Alvesco 34mcg 2 puffs twice a day and rinse mouth after each use for 1-2 weeks until your breathing symptoms return to baseline.  Pretreat with albuterol 2 puffs May use albuterol rescue inhaler 2 puffs every 4 to 6 hours as needed for shortness of breath, chest tightness, coughing, and wheezing. May use albuterol rescue inhaler 2 puffs 5 to 15 minutes prior to strenuous physical activities. Monitor frequency of use.   Other adverse food reactions, not elsewhere classified, subsequent encounter Avoiding gluten due to GI issues and jalapeno due to one episode of difficulty breathing. Sometimes asthma flares when eating at restaurants. No specific food triggers noted. Today's skin prick testing was negative to common foods. continue to avoid gluten and jalapeno. Get bloodwork.  GERD (gastroesophageal reflux disease) See handout for lifestyle and dietary modifications. Continue Nexium 40mg  daily. Nothing to eat or drink for 30 minutes afterwards.  Return in about 3 months (around 06/25/2021).  Meds ordered this encounter  Medications   azelastine (ASTELIN) 0.1 % nasal spray    Sig: Place 1-2 sprays into both nostrils 2 (two) times daily as needed (nasal drainage). Use in each nostril as directed    Dispense:  30 mL    Refill:  5    Lab Orders         Allergens w/Total  IgE Area 2         Allergen Gluten f79         Wheat IgE         Jalapeno Pepper, IgE      Other allergy screening: Food allergy:  currently avoiding gluten due to GI issues. Patient had colonoscopy recently and her gut was "twisted" Mainly has issues with constipation.  Medication allergy: yes Hymenoptera allergy: no Urticaria: no Eczema:no History of recurrent infections suggestive of immunodeficency: no  Diagnostics: Spirometry:  Tracings reviewed. Her effort: Good reproducible efforts. FVC: 2.45L FEV1: 2.02L, 84% predicted FEV1/FVC ratio: 82% Interpretation: Spirometry consistent with normal pattern with no improvement in FEV1 post bronchodilator treatment. Clinically feeling unchanged.   Please see scanned spirometry results for details.  Skin Testing: Environmental allergy panel and select foods. Negative to indoor/outdoor allergens and common foods. Positive control was borderline positive questioning the validity of the results.  Results discussed with patient/family.  Airborne Adult Perc - 03/27/21 1500     Time Antigen Placed 1500    Allergen Manufacturer Greer    Location Back    Number of Test 59    1. Control-Buffer 50% Glycerol Negative    2. Control-Histamine 1 mg/ml --   +/-   3. Albumin saline Negative    4. [place] Negative    5. [place] Negative    6. Johnson Negative  7. [facility] Negative    8. Meadow Fescue Negative    9. Perennial Rye Negative    10. Sweet Vernal Negative    11. Timothy Negative    12. Cocklebur Negative    13. Burweed Marshelder Negative    14. Ragweed, short Negative    15. Ragweed, Giant Negative    16. Plantain,  English Negative    17. Lamb's Quarters Negative    18. Sheep Sorrell Negative    19. Rough Pigweed Negative    20. Marsh Elder, Rough Negative    21. Mugwort, Common Negative    22. Ash mix Negative    23. Birch mix Negative    24. Beech American Negative    25. Box, Elder Negative    26. Cedar, red  Negative    27. Cottonwood, [place] Negative    28. Elm mix Negative    29. Hickory Negative    30. Maple mix Negative    31. Oak, [place] mix Negative    32. Pecan Pollen Negative    33. Pine mix Negative    34. Sycamore Eastern Negative    35. Alabaster, Black Pollen Negative    36. Alternaria alternata Negative    37. Cladosporium Herbarum Negative    38. Aspergillus mix Negative    39. Penicillium mix Negative    40. Bipolaris sorokiniana (Helminthosporium) Negative    41. Drechslera spicifera (Curvularia) Negative    42. Mucor plumbeus Negative    43. Fusarium moniliforme Negative    44. Aureobasidium pullulans (pullulara) Negative    45. Rhizopus oryzae Negative    46. Botrytis cinera Negative    47. Epicoccum nigrum Negative    48. Phoma betae Negative    49. Candida Albicans Negative    50. Trichophyton mentagrophytes Negative    51. Mite, D Farinae  5,000 AU/ml Negative    52. Mite, D Pteronyssinus  5,000 AU/ml Negative    53. Cat Hair 10,000 BAU/ml Negative    54.  Dog Epithelia Negative    55. Mixed Feathers Negative    56. Horse Epithelia Negative    57. Cockroach, German Negative    58. Mouse Negative    59. Tobacco Leaf Negative             Food Perc - 03/27/21 1500       Test Information   Time Antigen Placed 1500    Allergen Manufacturer Greer    Location Back    Number of allergen test 10      Food   1. Peanut Negative    2. Soybean food Negative    3. Wheat, whole Negative    4. Sesame Negative    5. Milk, cow Negative    6. Egg White, chicken Negative    7. Casein Negative    8. Shellfish mix Negative    9. Fish mix Negative    10. Cashew Negative             Past Medical History: Patient Active Problem List   Diagnosis Date Noted   Chronic rhinitis 03/28/2021   Other adverse food reactions, not elsewhere classified, subsequent encounter 03/28/2021   Medicare annual wellness visit, subsequent 76/19/5093   Diastolic dysfunction  26/71/2458   Pain in left knee 04/28/2019   Heart murmur 02/16/2019   Hyperlipidemia LDL goal <100 02/16/2019   Class 1 obesity with serious comorbidity and body mass index (BMI) of 30.0 to 30.9 in adult 01/04/2019  Pelvic floor dysfunction in female 12/06/2018   Constipation due to outlet dysfunction 12/06/2018   Lactose intolerance 12/06/2018   Diverticulosis 05/27/2018   Osteopenia 01/19/2018   Irritable bowel syndrome with constipation 01/17/2018   Non-celiac gluten sensitivity 01/17/2018   History of colonic polyp 01/17/2018   History of stroke 01/15/2018   GERD (gastroesophageal reflux disease)    Asthma    S/P cervical spinal fusion 09/29/2017   Macrocytosis without anemia 11/25/2015   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   Heart palpitations 09/08/2015   Vitamin B12 deficiency 04/06/2014   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   Vestibular neuritis 10/15/2006   Family history of malignant neoplasm of breast 03/18/2006   Diaphragmatic hernia 03/18/2006   Premature menopause 03/18/2006   Past Medical History:  Diagnosis Date   Allergy    Arthritis    Asthma    Back pain    Benign positional vertigo    Chicken pox    Colon polyps    Cough 10/09/2020   COVID-19 10/09/2020   Diverticulitis    Early menopause occurring in patient age younger than 5 years    Fibromyalgia    GERD (gastroesophageal reflux disease)    Gluten intolerance    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 Still unclear what is cuasing her pain. May be nerve related. May benefit from starting on gabapentin. Start gabapentin 300 mg TID. F/u 4 weeks   Night sweats    Palpitations    Recurrent upper respiratory infection (URI)    Right arm weakness 09/28/2015    Last Assessment & Plan:  Formatting of this note might be different from the original. Referral to PT today. Suspect residual after stroke in left basal ganglia   Sleep apnea    Snoring 01/04/2019   Stroke (cerebrum) ([place]) 2017   right UE/neck sx, resolved.    Vertigo 08/11/2015   Last Assessment & Plan:  Formatting of this note might be different from the original. Concern given gradual onset over the last week and a half in addition to neck pain. Patient needs neuroimaging to rule out dissecting vertebral aneurysm. Patient advised to go to emergency room. She declined ambulance transport   Vitamin B 12 deficiency    Vitamin D deficiency    Past Surgical History: Past Surgical History:  Procedure Laterality Date   [facility] [place]   COLONOSCOPY W/ POLYPECTOMY  05/26/2018   x5 polyps   KNEE ARTHROSCOPY Bilateral 2013   meniscus  x2   NECK SURGERY  2018   disc fusion   UPPER GASTROINTESTINAL ENDOSCOPY     Medication List:  Current Outpatient Medications  Medication Sig Dispense Refill   albuterol (VENTOLIN HFA) 108 (90 Base) MCG/ACT inhaler Inhale 2 puffs into the lungs every 6 (six) hours as needed for wheezing or shortness of breath. 8 g 0   atorvastatin (LIPITOR) 20 MG tablet Take 1 tablet (20 mg total) by mouth daily. 90 tablet 3   azelastine (ASTELIN) 0.1 % nasal spray Place 1-2 sprays into both nostrils 2 (two) times daily as needed (nasal drainage). Use in each nostril as directed 30 mL 5   Cholecalciferol 25 MCG (1000 UT) capsule  Take 1,000 Units by mouth daily.     cyanocobalamin 1000 MCG tablet Take 1,000 mcg by mouth daily.     esomeprazole (NEXIUM) 40 MG capsule Take 1 capsule (40 mg total) by mouth daily at 12 noon. 90 capsule 3   loratadine (CLARITIN) 10 MG tablet Take 10 mg by mouth daily as needed.     Magnesium 250 MG TABS Take 1 tablet by mouth daily.     Multiple Vitamins-Minerals (ALIVE WOMENS 50+ PO) Take by  mouth daily.     polyethylene glycol (MIRALAX / GLYCOLAX) 17 g packet Take 17 g by mouth daily as needed.     Probiotic Product (PROBIOTIC ADVANCED) CAPS daily.      Wheat Dextrin (BENEFIBER) POWD Take 5 mg by mouth daily.     No current facility-administered medications for this visit.   Allergies: Allergies  Allergen Reactions   Hydromorphone Other (See Comments)    HYPOTENSION   Meperidine Other (See Comments)    "BP drops"   Sulfa Antibiotics Other (See Comments)    States she does not know the reaction    Social History: Social History   Socioeconomic History   Marital status: Married    Spouse name: Corlis Angelica   Number of children: 3   Years of education: Not on file   Highest education level: Not on file  Occupational History   Occupation: retired Pharmacist, hospital  Tobacco Use   Smoking status: Never    Passive exposure: Never   Smokeless tobacco: Never  Vaping Use   Vaping Use: Never used  Substance and Sexual Activity   Alcohol use: Yes    Alcohol/week: 1.0 standard drink    Types: 1 Glasses of wine per week    Comment: 1/2-1 per day   Drug use: Never   Sexual activity: Yes    Partners: Male    Birth control/protection: None    Comment: married,intercourse age 54, less than 5 sexual partners,des neg  Other Topics Concern   Not on file  Social History Narrative   Married second family - 8 kids 21 grandkids blended   Haematologist, retired Pharmacist, hospital   Caffeine 2/day   Smoker - never   EtOH - rare   No drugs   Wears her seatbelt, smoke alarm at home.   Feels safe in her relationships.   Social Determinants of Health   Financial Resource Strain: Low Risk    Difficulty of Paying Living Expenses: Not hard at all  Food Insecurity: No Food Insecurity   Worried About Charity fundraiser in the Last Year: Never true   [place] in the Last Year: Never true  Transportation Needs: No Transportation Needs   Lack of Transportation (Medical): No   Lack of  Transportation (Non-Medical): No  Physical Activity: Inactive   Days of Exercise per Week: 0 days   Minutes of Exercise per Session: 0 min  Stress: No Stress Concern Present   Feeling of Stress : Not at all  Social Connections: Socially Integrated   Frequency of Communication with Friends and Family: More than three times a week   Frequency of Social Gatherings with Friends and Family: Twice a week   Attends Religious Services: More than 4 times per year   Active Member of [employer] or Organizations: Yes   Attends Music therapist: More than 4 times per year   Marital Status: Married   Lives in a 66 year old house. Smoking: denies Occupation:  retired IT sales professional History: Environmental education officer in the house: no Charity fundraiser in the family room: no Carpet in the bedroom: no Heating: heat pump Cooling: heat pump Pet: no  Family History: Family History  Problem Relation Age of Onset   Depression Mother    Heart disease Mother    Early death Mother 29   Hypertension Mother    Thyroid disease Mother    Anxiety disorder Mother    Obesity Mother    Arthritis Father    Heart disease Father    Depression Sister 74   Early death Sister    Drug abuse Sister    Lung cancer Sister    Mental illness Sister    COPD Sister    Diabetes Maternal Grandmother    Early death Maternal Grandmother    Early death Maternal Grandfather    Heart disease Maternal Grandfather    Breast cancer Cousin    Colon cancer Neg Hx    Esophageal cancer Neg Hx    Stomach cancer Neg Hx    Pancreatic cancer Neg Hx    Rectal cancer Neg Hx    Problem                               Relation Asthma                                   Son  Eczema                                Daughter  Food allergy                          no Allergic rhino conjunctivitis     no  Review of Systems  Constitutional:  Negative for appetite change, chills, fever and unexpected weight change.  HENT:  Positive for  congestion and postnasal drip. Negative for rhinorrhea.   Eyes:  Negative for itching.  Respiratory:  Positive for cough, chest tightness, shortness of breath and wheezing.   Cardiovascular:  Negative for chest pain.  Gastrointestinal:  Positive for constipation. Negative for abdominal pain.  Genitourinary:  Negative for difficulty urinating.  Skin:  Negative for rash.  Neurological:  Positive for headaches.   Objective: BP 124/80   Pulse 74   Temp 97.7 F (36.5 C)   Resp 18   Ht 5\' 5"  (1.651 m)   Wt 180 lb 6.4 oz (81.8 kg)   SpO2 97%   BMI 30.02 kg/m  Body mass index is 30.02 kg/m. Physical Exam Vitals and nursing note reviewed.  Constitutional:      Appearance: Normal appearance. She is well-developed.  HENT:     Head: Normocephalic and atraumatic.     Right Ear: Tympanic membrane and external ear normal.     Left Ear: Tympanic membrane and external ear normal.     Nose: Nose normal.     Mouth/Throat:     Mouth: Mucous membranes are moist.     Pharynx: Oropharynx is clear.  Eyes:     Conjunctiva/sclera: Conjunctivae normal.  Cardiovascular:     Rate and Rhythm: Normal rate and regular rhythm.     Heart sounds: Normal heart sounds. No murmur heard.   No friction rub. No gallop.  Pulmonary:  Effort: Pulmonary effort is normal.     Breath sounds: Normal breath sounds. No wheezing, rhonchi or rales.  Musculoskeletal:     Cervical back: Neck supple.  Skin:    General: Skin is warm.     Findings: No rash.  Neurological:     Mental Status: She is alert and oriented to person, place, and time.  Psychiatric:        Behavior: Behavior normal.  The plan was reviewed with the patient/family, and all questions/concerned were addressed.  It was my pleasure to see Kayla Rosales today and participate in her care. Please feel free to contact me with any questions or concerns.  Sincerely,  Rexene Alberts, DO Allergy & Immunology  [facility] office: [place] office: [phone]

## 2021-03-27 ENCOUNTER — Ambulatory Visit (INDEPENDENT_AMBULATORY_CARE_PROVIDER_SITE_OTHER): Payer: Medicare Other | Admitting: Allergy

## 2021-03-27 ENCOUNTER — Encounter: Payer: Self-pay | Admitting: Allergy

## 2021-03-27 ENCOUNTER — Other Ambulatory Visit: Payer: Self-pay

## 2021-03-27 VITALS — BP 124/80 | HR 74 | Temp 97.7°F | Resp 18 | Ht 65.0 in | Wt 180.4 lb

## 2021-03-27 DIAGNOSIS — K219 Gastro-esophageal reflux disease without esophagitis: Secondary | ICD-10-CM

## 2021-03-27 DIAGNOSIS — J4520 Mild intermittent asthma, uncomplicated: Secondary | ICD-10-CM

## 2021-03-27 DIAGNOSIS — J310 Chronic rhinitis: Secondary | ICD-10-CM | POA: Diagnosis not present

## 2021-03-27 DIAGNOSIS — T781XXD Other adverse food reactions, not elsewhere classified, subsequent encounter: Secondary | ICD-10-CM

## 2021-03-27 DIAGNOSIS — Z0182 Encounter for allergy testing: Secondary | ICD-10-CM

## 2021-03-27 MED ORDER — AZELASTINE HCL 0.1 % NA SOLN: 1.0000 | Freq: Two times a day (BID) | NASAL | 5 refills | Status: DC | PRN

## 2021-03-27 NOTE — Patient Instructions (Addendum)
Today's skin testing showed: Negative to indoor/outdoor allergens and common foods. Results given.  Positive control was borderline positive questioning the validity of the results.   Rhinitis: Use Flonase (fluticasone) nasal spray 1 spray per nostril twice a day as needed for nasal congestion.  Use azelastine nasal spray 1-2 sprays per nostril twice a day as needed for runny nose/drainage. Nasal saline spray (i.e., Simply Saline) or nasal saline lavage (i.e., NeilMed) is recommended as needed and prior to medicated nasal sprays. Get bloodwork We are ordering labs, so please allow 1-2 weeks for the results to come back. With the newly implemented Cures Act, the labs might be visible to you at the same time that they become visible to me. However, I will not address the results until all of the results are back, so please be patient.  In the meantime, continue recommendations in your patient instructions, including avoidance measures (if applicable), until you hear from me.  Asthma Daily controller medication(s): none During upper respiratory infections/asthma flares:  Start Alvesco 101mcg 2 puffs twice a day and rinse mouth after each use for 1-2 weeks until your breathing symptoms return to baseline.  Pretreat with albuterol 2 puffs May use albuterol rescue inhaler 2 puffs every 4 to 6 hours as needed for shortness of breath, chest tightness, coughing, and wheezing. May use albuterol rescue inhaler 2 puffs 5 to 15 minutes prior to strenuous physical activities. Monitor frequency of use.  Asthma control goals:  Full participation in all desired activities (may need albuterol before activity) Albuterol use two times or less a week on average (not counting use with activity) Cough interfering with sleep two times or less a month Oral steroids no more than once a year No hospitalizations   Heartburn: See handout for lifestyle and dietary modifications. Continue Nexium 40mg  daily. Nothing to  eat or drink for 30 minutes afterwards.  Foods: Avoid gluten and jalapeno. Get bloodwork.  Follow up in 3 months or sooner if needed.

## 2021-03-28 DIAGNOSIS — J310 Chronic rhinitis: Secondary | ICD-10-CM | POA: Insufficient documentation

## 2021-03-28 DIAGNOSIS — T781XXD Other adverse food reactions, not elsewhere classified, subsequent encounter: Secondary | ICD-10-CM | POA: Insufficient documentation

## 2021-03-28 NOTE — Assessment & Plan Note (Signed)
·   See handout for lifestyle and dietary modifications.  Continue Nexium 40mg  daily. Nothing to eat or drink for 30 minutes afterwards.

## 2021-03-28 NOTE — Assessment & Plan Note (Signed)
Perennial rhinitis symptoms with worsening in the fall and winter.  Triggers include ironing and sewing.  Tried Claritin and Flonase with some benefit.  Past skin testing was positive to dust mite and was on AIT for 1.5 years with good benefit.  ENT evaluation showed deviated septum, no prior sinus surgeries.  Today's skin prick testing showed: Negative to indoor/outdoor allergens however the positive control was borderline positive questioning the validity of the results.   Will get bloodwork instead of intradermal testing.   Use Flonase (fluticasone) nasal spray 1 spray per nostril twice a day as needed for nasal congestion.   Use azelastine nasal spray 1-2 sprays per nostril twice a day as needed for runny nose/drainage.  Nasal saline spray (i.e., Simply Saline) or nasal saline lavage (i.e., NeilMed) is recommended as needed and prior to medicated nasal sprays.

## 2021-03-28 NOTE — Assessment & Plan Note (Signed)
Avoiding gluten due to GI issues and jalapeno due to one episode of difficulty breathing. Sometimes asthma flares when eating at restaurants. No specific food triggers noted.  Today's skin prick testing was negative to common foods.  continue to avoid gluten and jalapeno.  Get bloodwork.

## 2021-03-28 NOTE — Assessment & Plan Note (Signed)
Chest tightness, shortness of breath, coughing and wheezing for the past 1 year.  COVID-19 in the summer 2022.  Using albuterol once a week with good benefit.  2022 chest x-ray showed calcified granulomas in the past.  Today's spirometry showed: normal pattern with no improvement in FEV1 post bronchodilator treatment. Clinically feeling unchanged.   Daily controller medication(s): none  During upper respiratory infections/asthma flares:  o Start Alvesco 63mcg 2 puffs twice a day and rinse mouth after each use for 1-2 weeks until your breathing symptoms return to baseline.  o Pretreat with albuterol 2 puffs  May use albuterol rescue inhaler 2 puffs every 4 to 6 hours as needed for shortness of breath, chest tightness, coughing, and wheezing. May use albuterol rescue inhaler 2 puffs 5 to 15 minutes prior to strenuous physical activities. Monitor frequency of use.

## 2021-04-02 ENCOUNTER — Other Ambulatory Visit (HOSPITAL_COMMUNITY)
Admission: RE | Admit: 2021-04-02 | Discharge: 2021-04-02 | Disposition: A | Discharge status: Home / Self Care | Payer: Medicare Other | Source: Ambulatory Visit | Attending: Obstetrics & Gynecology | Admitting: Obstetrics & Gynecology

## 2021-04-02 ENCOUNTER — Encounter: Payer: Self-pay | Admitting: Obstetrics & Gynecology

## 2021-04-02 ENCOUNTER — Other Ambulatory Visit: Payer: Self-pay

## 2021-04-02 ENCOUNTER — Ambulatory Visit (INDEPENDENT_AMBULATORY_CARE_PROVIDER_SITE_OTHER): Payer: Medicare Other | Admitting: Obstetrics & Gynecology

## 2021-04-02 VITALS — BP 118/82 | HR 73 | Ht 64.25 in | Wt 180.0 lb

## 2021-04-02 DIAGNOSIS — M8589 Other specified disorders of bone density and structure, multiple sites: Secondary | ICD-10-CM

## 2021-04-02 DIAGNOSIS — E6609 Other obesity due to excess calories: Secondary | ICD-10-CM

## 2021-04-02 DIAGNOSIS — Z01419 Encounter for gynecological examination (general) (routine) without abnormal findings: Secondary | ICD-10-CM | POA: Insufficient documentation

## 2021-04-02 DIAGNOSIS — Z6830 Body mass index (BMI) 30.0-30.9, adult: Secondary | ICD-10-CM

## 2021-04-02 DIAGNOSIS — Z9189 Other specified personal risk factors, not elsewhere classified: Secondary | ICD-10-CM

## 2021-04-02 DIAGNOSIS — M545 Low back pain, unspecified: Secondary | ICD-10-CM

## 2021-04-02 DIAGNOSIS — M810 Age-related osteoporosis without current pathological fracture: Secondary | ICD-10-CM

## 2021-04-02 DIAGNOSIS — Z780 Asymptomatic menopausal state: Secondary | ICD-10-CM | POA: Diagnosis not present

## 2021-04-02 NOTE — Progress Notes (Signed)
Kayla Rosales 1955-07-02 [id number]   History:    65 y.o. G4P3A1L3 Remarried x 7 years.  Husband has 5 sons.  Many grand-children both sides.   RP:  Established patient presenting for annual gyn exam    HPI: Early menopause in the 7s, on no HRT.  No PMB.  Had a stroke on HRT 4 yrs ago and stopped hormones since then.  Good recovery from stroke.  No pelvic pain.  No pain with intercourse.  Pap Neg in 2020.  Urine normal except for SUI.  Breasts normal.  Mammo Neg 05/2020.  Body mass index improved at 30.66.  Better diet, will increase physical activity.  Health labs with family physician.  BD Osteopenia T-Score -1.7 in 05/2020.  Colono 01/2021 Precancer Polyps removed, twisted Colon.  Followed by Gertie Fey.    Past medical history,surgical history, family history and social history were all reviewed and documented in the EPIC chart.  Gynecologic History No LMP recorded. Patient is postmenopausal.  Obstetric History OB History  Gravida Para Term Preterm AB Living  4 3   1 1 3   SAB IAB Ectopic Multiple Live Births      0        # Outcome Date GA Lbr Len/2nd Weight Sex Delivery Anes PTL Lv  4 AB           3 Para           2 Para           1 Preterm              ROS: A ROS was performed and pertinent positives and negatives are included in the history.  GENERAL: No fevers or chills. HEENT: No change in vision, no earache, sore throat or sinus congestion. NECK: No pain or stiffness. CARDIOVASCULAR: No chest pain or pressure. No palpitations. PULMONARY: No shortness of breath, cough or wheeze. GASTROINTESTINAL: No abdominal pain, nausea, vomiting or diarrhea, melena or bright red blood per rectum. GENITOURINARY: No urinary frequency, urgency, hesitancy or dysuria. MUSCULOSKELETAL: No joint or muscle pain, no back pain, no recent trauma. DERMATOLOGIC: No rash, no itching, no lesions. ENDOCRINE: No polyuria, polydipsia, no heat or cold intolerance. No recent change in weight. HEMATOLOGICAL: No  anemia or easy bruising or bleeding. NEUROLOGIC: No headache, seizures, numbness, tingling or weakness. PSYCHIATRIC: No depression, no loss of interest in normal activity or change in sleep pattern.     Exam:   BP 118/82    Pulse 73    Ht 5' 4.25" (1.632 m)    Wt 180 lb (81.6 kg)    SpO2 97%    BMI 30.66 kg/m   Body mass index is 30.66 kg/m.  General appearance : Well developed well nourished female. No acute distress HEENT: Eyes: no retinal hemorrhage or exudates,  Neck supple, trachea midline, no carotid bruits, no thyroidmegaly Lungs: Clear to auscultation, no rhonchi or wheezes, or rib retractions  Heart: Regular rate and rhythm, no murmurs or gallops Breast:Examined in sitting and supine position were symmetrical in appearance, no palpable masses or tenderness,  no skin retraction, no nipple inversion, no nipple discharge, no skin discoloration, no axillary or supraclavicular lymphadenopathy Abdomen: no palpable masses or tenderness, no rebound or guarding Extremities: no edema or skin discoloration or tenderness  Pelvic: Vulva: Normal             Vagina: No gross lesions or discharge  Cervix: No gross lesions or discharge.  Pap reflex done.  Uterus  AV, normal size, shape and consistency, non-tender and mobile  Adnexa  Without masses or tenderness  Anus: Normal  U/A Neg   Assessment/Plan:  65 y.o. female for annual exam   1. Encounter for routine gynecological examination with Papanicolaou smear of cervix Early menopause in the 30s, on no HRT.  No PMB.  Had a stroke on HRT 4 yrs ago and stopped hormones since then.  Good recovery from stroke.  No pelvic pain.  No pain with intercourse.  Pap Neg in 2020.  Pap reflex done today. Urine normal except for SUI.  Breasts normal.  Mammo Neg 05/2020.  Body mass index improved at 30.66.  Better diet, will increase physical activity.  Health labs with family physician.  BD Osteopenia T-Score -1.7 in 05/2020.  [place] 01/2021 Precancer Polyps  removed, twisted Colon.  Followed by Gertie Fey. - Cytology - PAP( [facility])  2. At risk of fracture due to osteopenia  3. Postmenopausal Early menopause in the 91s, on no HRT.  No PMB.  Had a stroke on HRT 4 yrs ago and stopped hormones since then.  Good recovery from stroke.  No pelvic pain.  No pain with intercourse  4. Osteopenia of multiple sites BD Osteopenia T-Score -1.7 in 05/2020.  Will increase weight bearing physical activities.  Vit D and Ca++.  5. Low back pain, unspecified back pain laterality, unspecified chronicity, unspecified whether sciatica present U/A Neg, reassured. - Urinalysis,Complete w/RFL Culture  6. Class 1 obesity due to excess calories with serious comorbidity and body mass index (BMI) of 30.0 to 30.9 in adult  Low calorie/carb diet.  Increase fitness activities.  Princess Bruins MD, 9:20 AM 04/02/2021

## 2021-04-03 LAB — ALLERGENS W/TOTAL IGE AREA 2

## 2021-04-03 LAB — CYTOLOGY - PAP: Diagnosis: NEGATIVE

## 2021-04-03 LAB — ALLERGEN, WHEAT, F4: Wheat IgE: <0.1 kU/L

## 2021-04-03 LAB — ALLERGEN GLUTEN F79: Allergen Gluten IgE: <0.1 kU/L

## 2021-04-03 LAB — JALAPENO PEPPER, IGE
Class Interpretation: 0
Jalapeno Pepper: <0.35 kU/L (ref <0.35)

## 2021-04-03 LAB — SPECIMEN STATUS REPORT

## 2021-04-04 LAB — URINALYSIS, COMPLETE W/RFL CULTURE
Bacteria, UA: NONE SEEN /HPF
Bilirubin Urine: NEGATIVE
Glucose, UA: NEGATIVE
Hyaline Cast: NONE SEEN /LPF
Ketones, ur: NEGATIVE
Leukocyte Esterase: NEGATIVE
Nitrites, Initial: NEGATIVE
Protein, ur: NEGATIVE
Specific Gravity, Urine: 1.006 (ref 1.001–1.035)
pH: 5.5 (ref 5.0–8.0)

## 2021-04-04 LAB — URINE CULTURE
MICRO NUMBER:: 12773834
Result:: NO GROWTH
SPECIMEN QUALITY:: ADEQUATE

## 2021-04-04 LAB — CULTURE INDICATED

## 2021-05-21 ENCOUNTER — Other Ambulatory Visit: Payer: Self-pay

## 2021-05-22 ENCOUNTER — Encounter: Payer: Self-pay | Admitting: Family Medicine

## 2021-05-22 ENCOUNTER — Ambulatory Visit (INDEPENDENT_AMBULATORY_CARE_PROVIDER_SITE_OTHER): Payer: Medicare Other | Admitting: Family Medicine

## 2021-05-22 ENCOUNTER — Ambulatory Visit
Admission: RE | Admit: 2021-05-22 | Discharge: 2021-05-22 | Disposition: A | Discharge status: Home / Self Care | Payer: Medicare Other | Source: Ambulatory Visit | Attending: Family Medicine | Admitting: Family Medicine

## 2021-05-22 VITALS — BP 104/65 | HR 66 | Temp 97.7°F | Ht 64.25 in | Wt 184.0 lb

## 2021-05-22 DIAGNOSIS — J329 Chronic sinusitis, unspecified: Secondary | ICD-10-CM

## 2021-05-22 DIAGNOSIS — T85848A Pain due to other internal prosthetic devices, implants and grafts, initial encounter: Secondary | ICD-10-CM | POA: Diagnosis not present

## 2021-05-22 DIAGNOSIS — B9689 Other specified bacterial agents as the cause of diseases classified elsewhere: Secondary | ICD-10-CM | POA: Diagnosis not present

## 2021-05-22 DIAGNOSIS — Z1231 Encounter for screening mammogram for malignant neoplasm of breast: Secondary | ICD-10-CM

## 2021-05-22 MED ORDER — AMOXICILLIN-POT CLAVULANATE 875-125 MG PO TABS: 1.0000 | ORAL_TABLET | Freq: Two times a day (BID) | ORAL | 0 refills | Status: DC

## 2021-05-22 NOTE — Progress Notes (Signed)
This visit occurred during the SARS-CoV-2 public health emergency.  Safety protocols were in place, including screening questions prior to the visit, additional usage of staff PPE, and extensive cleaning of exam room while observing appropriate contact time as indicated for disinfecting solutions.    Kayla Rosales , February 09, 1956, 66 y.o., female MRN: [id number] Patient Care Team    Relationship Specialty Notifications Start End  Ma Hillock, DO PCP - General Family Medicine  01/15/18   Elouise Munroe, MD PCP - Cardiology Cardiology Admissions 07/09/19   Gatha Mayer, MD Consulting Physician Gastroenterology  01/19/18   Princess Bruins, MD Consulting Physician Obstetrics and Gynecology  04/03/20     Chief Complaint  Patient presents with   Headache    Pt c/o facial pain, nasal dryness, nasal congestion, ear fullness, fatigue chills x 2.5 weeks; neg covid test x2 last one a week ago     Subjective: Pt presents for an OV with complaints of facial pain, nasal congestion, ear fullness, chills and fatigue for over 14 days.  Patient reports she feels like she has pressure over her maxillary sinuses and her eyes.  She denies cough or shortness of breath.  She denies GI symptoms.  She reports she has a dental implant for the past 2 months on the right upper side.  This had been causing sinus pain from the root being infected.  Therefore the tooth was pulled and a dental implant had been placed.  Some of the discomfort is coming from the location of where she was feeling the abscess in her right sinus/maxillary area.  She denies any current fevers.  She is tolerating p.o.  She has follow-up with her dentist in a couple weeks for a crown.  Depression screen [facility] 2/9 03/19/2021 10/24/2020 04/03/2020 08/17/2019 05/20/2019  Decreased Interest 0 0 0 1 0  Down, Depressed, Hopeless 0 0 0 1 0  PHQ - 2 Score 0 0 0 2 0  Altered sleeping 1 0 3 3 -  Tired, decreased energy 1 0 3 3 -  Change in appetite  0 0 0 1 -  Feeling bad or failure about yourself  0 0 0 1 -  Trouble concentrating 0 0 0 1 -  Moving slowly or fidgety/restless 0 0 0 2 -  Suicidal thoughts 0 0 0 0 -  PHQ-9 Score 2 0 6 13 -  Difficult doing work/chores - Not difficult at all - Somewhat difficult -    Allergies  Allergen Reactions   Hydromorphone Other (See Comments)    HYPOTENSION   Meperidine Other (See Comments)    "BP drops"   Sulfa Antibiotics Other (See Comments)    States she does not know the reaction    Social History   Social History Narrative   Married second family - 8 kids 79 grandkids blended   Haematologist, retired Pharmacist, hospital   Caffeine 2/day   Smoker - never   EtOH - rare   No drugs   Wears her seatbelt, smoke alarm at home.   Feels safe in her relationships.   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 Still unclear what is cuasing her pain. May be nerve related. May benefit from starting on gabapentin. Start gabapentin 300 mg TID. F/u 4 weeks   Night sweats    Palpitations    Recurrent upper respiratory infection (URI)    Right arm weakness 09/28/2015   Last Assessment & Plan:  Formatting of this note might be different from the original. Referral to PT today. Suspect residual after stroke in left basal ganglia   Sleep apnea    Snoring 01/04/2019   Stroke (cerebrum) ([place]) 2017   right UE/neck sx,  resolved.    Vertigo 08/11/2015   Last Assessment & Plan:  Formatting of this note might be different from the original. Concern given gradual onset over the last week and a half in addition to neck pain. Patient needs neuroimaging to rule out dissecting vertebral aneurysm. Patient advised to go to emergency room. She declined ambulance transport   Vitamin B 12 deficiency    Vitamin D deficiency    Past Surgical History:  Procedure Laterality Date   [facility] [place] yr repeat recommended   COLONOSCOPY W/ POLYPECTOMY  05/26/2018   x5 polyps   KNEE ARTHROSCOPY Bilateral 2013   meniscus  x2   NECK SURGERY  2018   disc fusion   UPPER GASTROINTESTINAL ENDOSCOPY     Family History  Problem Relation Age of Onset   Depression Mother    Heart disease Mother    Early death Mother 10   Hypertension Mother    Thyroid disease Mother    Anxiety disorder Mother    Obesity Mother    Arthritis Father    Heart disease Father    Depression Sister 8   Early death Sister    Drug abuse Sister    Lung cancer Sister    Mental illness Sister    COPD Sister    Diabetes Maternal Grandmother    Early death Maternal Grandmother    Early death Maternal Grandfather    Heart disease Maternal Grandfather    Breast cancer Cousin    Colon cancer Neg Hx    Esophageal cancer Neg Hx    Stomach cancer Neg Hx    Pancreatic cancer Neg Hx    Rectal cancer Neg Hx    Allergies as of 05/22/2021       Reactions   Hydromorphone Other (See Comments)   HYPOTENSION   Meperidine Other (See Comments)   "BP drops"   Sulfa Antibiotics Other (See Comments)   States she does not know the reaction        Medication List        Accurate as of May 22, 2021 11:59 PM. If you have any questions, ask your nurse or doctor.          albuterol 108 (90 Base) MCG/ACT inhaler Commonly known as: VENTOLIN HFA Inhale 2 puffs into the lungs every 6 (six) hours as needed for wheezing  or shortness of breath.   ALIVE WOMENS 50+ PO Take by mouth daily.   amoxicillin-clavulanate 875-125 MG tablet Commonly  known as: AUGMENTIN Take 1 tablet by mouth 2 (two) times daily. Started by: Howard Pouch, DO   atorvastatin 20 MG tablet Commonly known as: LIPITOR Take 1 tablet (20 mg total) by mouth daily.   azelastine 0.1 % nasal spray Commonly known as: ASTELIN Place 1-2 sprays into both nostrils 2 (two) times daily as needed (nasal drainage). Use in each nostril as directed   Benefiber Powd Take 5 mg by mouth daily.   Cholecalciferol 25 MCG (1000 UT) capsule Take 1,000 Units by mouth daily.   cyanocobalamin 1000 MCG tablet Take 1,000 mcg by mouth daily.   esomeprazole 40 MG capsule Commonly known as: NEXIUM Take 1 capsule (40 mg total) by mouth daily at 12 noon.   fluticasone 50 MCG/ACT nasal spray Commonly known as: FLONASE Place into both nostrils daily.   loratadine 10 MG tablet Commonly known as: CLARITIN Take 10 mg by mouth daily as needed.   Magnesium 250 MG Tabs Take 1 tablet by mouth daily.   polyethylene glycol 17 g packet Commonly known as: MIRALAX / GLYCOLAX Take 17 g by mouth daily as needed.   Probiotic Advanced Caps daily.        All past medical history, surgical history, allergies, family history, immunizations andmedications were updated in the EMR today and reviewed under the history and medication portions of their EMR.     ROS Negative, with the exception of above mentioned in HPI   Objective:  BP 104/65    Pulse 66    Temp 97.7 F (36.5 C) (Oral)    Ht 5' 4.25" (1.632 m)    Wt 184 lb (83.5 kg)    SpO2 97%    BMI 31.34 kg/m  Body mass index is 31.34 kg/m.  Physical Exam Vitals and nursing note reviewed.  Constitutional:      General: She is not in acute distress.    Appearance: Normal appearance. She is normal weight. She is not ill-appearing or toxic-appearing.  HENT:     Head: Normocephalic and atraumatic.      Comments: TTP max. Sinus. Mild swelling right max sinus area.     Right Ear: Tympanic membrane, ear canal and external ear normal. There is no impacted cerumen.     Left Ear: Tympanic membrane, ear canal and external ear normal. There is no impacted cerumen.     Nose: Congestion and rhinorrhea present.     Mouth/Throat:     Mouth: Mucous membranes are moist.     Pharynx: Oropharynx is clear. No oropharyngeal exudate or posterior oropharyngeal erythema.  Eyes:     General:        Right eye: No discharge.        Left eye: No discharge.     Extraocular Movements: Extraocular movements intact.     Conjunctiva/sclera: Conjunctivae normal.     Pupils: Pupils are equal, round, and reactive to light.  Cardiovascular:     Rate and Rhythm: Normal rate and regular rhythm.     Heart sounds: No murmur heard.   No gallop.  Pulmonary:     Effort: Pulmonary effort is normal.     Breath sounds: Normal breath sounds. No wheezing, rhonchi or rales.  Lymphadenopathy:     Cervical: No cervical adenopathy.  Skin:    Findings: No rash.  Neurological:     Mental Status: She is alert and oriented to person, place, and time. Mental status is at baseline.  Psychiatric:  Mood and Affect: Mood normal.        Behavior: Behavior normal.        Thought Content: Thought content normal.        Judgment: Judgment normal.    Assessment/Plan: NICHOLETTE DOLSON is a 66 y.o. female present for OV for  Bacterial sinusitis/dental implant Currently has signs and symptoms of bacterial sinusitis on exam today.  There may be a component of discomfort surrounding the dental implant, difficult to be sure.  Elected to treat with Augmentin.  Patient understands if area of dental work becomes more painful, she will need to follow-up with her dental team. Rest, hydrate.  +/- flonase, mucinex (DM if cough), nettie pot or nasal saline.  Discussed nasal lavage system augmentin prescribed, take until completed.  If cough  present it can last up to 6-8 weeks.  F/U 2 weeks if not improved.   Reviewed expectations re: course of current medical issues. Discussed self-management of symptoms. Outlined signs and symptoms indicating need for more acute intervention. Patient verbalized understanding and all questions were answered. Patient received an After-Visit Summary.    No orders of the defined types were placed in this encounter.  Meds ordered this encounter  Medications   amoxicillin-clavulanate (AUGMENTIN) 875-125 MG tablet    Sig: Take 1 tablet by mouth 2 (two) times daily.    Dispense:  20 tablet    Refill:  0   Referral Orders  No referral(s) requested today     Note is dictated utilizing voice recognition software. Although note has been proof read prior to signing, occasional typographical errors still can be missed. If any questions arise, please do not hesitate to call for verification.   electronically signed by:  Howard Pouch, DO  [place]

## 2021-05-22 NOTE — Patient Instructions (Signed)
Try the nasal navage system to help avoid sinusitis.  Start Augmentin every 12 hours for 10 days

## 2021-05-23 ENCOUNTER — Encounter: Payer: Self-pay | Admitting: Family Medicine

## 2021-05-23 NOTE — Telephone Encounter (Signed)
COVID vax updated

## 2021-06-06 ENCOUNTER — Encounter: Payer: Self-pay | Admitting: Family Medicine

## 2021-06-06 NOTE — Telephone Encounter (Signed)
FYI Pt scheduled for 06/11/21

## 2021-06-11 ENCOUNTER — Ambulatory Visit: Payer: Medicare Other | Admitting: Family Medicine

## 2021-06-19 ENCOUNTER — Encounter: Payer: Self-pay | Admitting: Family Medicine

## 2021-06-19 ENCOUNTER — Other Ambulatory Visit: Payer: Self-pay

## 2021-06-19 ENCOUNTER — Ambulatory Visit (INDEPENDENT_AMBULATORY_CARE_PROVIDER_SITE_OTHER): Payer: Medicare Other | Admitting: Family Medicine

## 2021-06-19 VITALS — BP 117/74 | HR 74 | Temp 98.0°F | Ht 64.25 in | Wt 182.0 lb

## 2021-06-19 DIAGNOSIS — R051 Acute cough: Secondary | ICD-10-CM | POA: Diagnosis not present

## 2021-06-19 DIAGNOSIS — J4521 Mild intermittent asthma with (acute) exacerbation: Secondary | ICD-10-CM

## 2021-06-19 LAB — POCT INFLUENZA A/B
Influenza A, POC: NEGATIVE
Influenza B, POC: NEGATIVE

## 2021-06-19 LAB — POC COVID19 BINAXNOW: SARS Coronavirus 2 Ag: NEGATIVE

## 2021-06-19 MED ORDER — IPRATROPIUM-ALBUTEROL 0.5-2.5 (3) MG/3ML IN SOLN
3.0000 mL | Freq: Once | RESPIRATORY_TRACT | Status: AC
  Administered 2021-06-19: 3 mL via RESPIRATORY_TRACT

## 2021-06-19 MED ORDER — FLUTICASONE PROPIONATE HFA 110 MCG/ACT IN AERO: 1.0000 | INHALATION_SPRAY | Freq: Every day | RESPIRATORY_TRACT | 5 refills | Status: DC

## 2021-06-19 MED ORDER — MONTELUKAST SODIUM 10 MG PO TABS: 10.0000 mg | ORAL_TABLET | Freq: Every day | ORAL | 3 refills | Status: DC

## 2021-06-19 NOTE — Progress Notes (Signed)
This visit occurred during the SARS-CoV-2 public health emergency.  Safety protocols were in place, including screening questions prior to the visit, additional usage of staff PPE, and extensive cleaning of exam room while observing appropriate contact time as indicated for disinfecting solutions.    Kayla Rosales , Jan 30, 1956, 66 y.o., female MRN: [id number] Patient Care Team    Relationship Specialty Notifications Start End  Ma Hillock, DO PCP - General Family Medicine  01/15/18   Elouise Munroe, MD PCP - Cardiology Cardiology Admissions 07/09/19   Gatha Mayer, MD Consulting Physician Gastroenterology  01/19/18   Princess Bruins, MD Consulting Physician Obstetrics and Gynecology  04/03/20     Chief Complaint  Patient presents with   Cough    Pt c/o cough, SOB, chest tightness, hoarseness x 4 days     Subjective: Kayla Rosales is a 66 y.o. female present for cough, chest tightness and hoarseness of 4 days duration.  She states she really does not feel all that bad but feels "winded.  "She has had multiple episodes of bronchitis and sinusitis since her COVID infection in June 2022.  This being her fifth episode of symptoms.  She was last seen approximately 1 month ago and treated for sinusitis and she reports she did have complete resolution of symptoms after treatment.  She has also solve the allergy group and they recommended she start a trial of steroid inhaler.  She admits she never did start the inhaler.  Prior note: Pt presents for an OV with complaints of facial pain, nasal congestion, ear fullness, chills and fatigue for over 14 days.  Patient reports she feels like she has pressure over her maxillary sinuses and her eyes.  She denies cough or shortness of breath.  She denies GI symptoms.  She reports she has a dental implant for the past 2 months on the right upper side.  This had been causing sinus pain from the root being infected.  Therefore the tooth was pulled  and a dental implant had been placed.  Some of the discomfort is coming from the location of where she was feeling the abscess in her right sinus/maxillary area.  She denies any current fevers.  She is tolerating p.o.  She has follow-up with her dentist in a couple weeks for a crown.  Depression screen [facility] 2/9 03/19/2021 10/24/2020 04/03/2020 08/17/2019 05/20/2019  Decreased Interest 0 0 0 1 0  Down, Depressed, Hopeless 0 0 0 1 0  PHQ - 2 Score 0 0 0 2 0  Altered sleeping 1 0 3 3 -  Tired, decreased energy 1 0 3 3 -  Change in appetite 0 0 0 1 -  Feeling bad or failure about yourself  0 0 0 1 -  Trouble concentrating 0 0 0 1 -  Moving slowly or fidgety/restless 0 0 0 2 -  Suicidal thoughts 0 0 0 0 -  PHQ-9 Score 2 0 6 13 -  Difficult doing work/chores - Not difficult at all - Somewhat difficult -    Allergies  Allergen Reactions   Hydromorphone Other (See Comments)    HYPOTENSION   Meperidine Other (See Comments)    "BP drops"   Sulfa Antibiotics Other (See Comments)    States she does not know the reaction    Social History   Social History Narrative   Married second family - 8 kids 63 grandkids blended   Haematologist, retired Pharmacist, hospital   Caffeine  2/day   Smoker - never   EtOH - rare   No drugs   Wears her seatbelt, smoke alarm at home.   Feels safe in her relationships.   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 Still unclear what is cuasing her pain. May be nerve related. May benefit from starting on gabapentin. Start gabapentin 300 mg TID. F/u 4 weeks   Night sweats    Palpitations    Recurrent upper respiratory infection (URI)    Right arm weakness 09/28/2015   Last Assessment & Plan:  Formatting of this note might be different from the original. Referral to PT today. 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    Vertigo 08/11/2015   Last Assessment & Plan:  Formatting of this note might be different from the original. Concern given gradual onset over the last week and a half in addition to neck pain. Patient needs neuroimaging to rule out dissecting vertebral aneurysm. Patient advised to go to emergency room. She declined ambulance transport   Vitamin B 12 deficiency    Vitamin D deficiency    Past Surgical History:  Procedure Laterality Date   [facility] [place]   COLONOSCOPY W/ POLYPECTOMY  05/26/2018   x5 polyps   KNEE ARTHROSCOPY Bilateral 2013   meniscus  x2   NECK SURGERY  2018   disc fusion   UPPER GASTROINTESTINAL ENDOSCOPY     Family History  Problem Relation Age of Onset   Depression Mother    Heart disease Mother    Early death Mother 72   Hypertension Mother    Thyroid disease Mother    Anxiety disorder Mother    Obesity Mother    Arthritis Father    Heart disease Father    Depression Sister 30   Early death Sister    Drug abuse Sister    Lung cancer Sister    Mental illness Sister    COPD Sister    Diabetes Maternal Grandmother    Early death Maternal Grandmother    Early death Maternal Grandfather    Heart disease Maternal Grandfather    Breast cancer Cousin    Colon cancer Neg Hx    Esophageal cancer Neg Hx    Stomach cancer Neg Hx    Pancreatic cancer Neg Hx    Rectal  cancer Neg Hx    Allergies as of 06/19/2021       Reactions   Hydromorphone Other (See Comments)   HYPOTENSION   Meperidine Other (See Comments)   "BP drops"   Sulfa Antibiotics Other (See  Comments)   States she does not know the reaction        Medication List        Accurate as of June 19, 2021 11:59 PM. If you have any questions, ask your nurse or doctor.          STOP taking these medications    amoxicillin-clavulanate 875-125 MG tablet Commonly known as: AUGMENTIN Stopped by: Howard Pouch, DO   azelastine 0.1 % nasal spray Commonly known as: ASTELIN Stopped by: Howard Pouch, DO       TAKE these medications    albuterol 108 (90 Base) MCG/ACT inhaler Commonly known as: VENTOLIN HFA Inhale 2 puffs into the lungs every 6 (six) hours as needed for wheezing or shortness of breath.   ALIVE WOMENS 50+ PO Take by mouth daily.   atorvastatin 20 MG tablet Commonly known as: LIPITOR Take 1 tablet (20 mg total) by mouth daily.   Benefiber Powd Take 5 mg by mouth daily.   Cholecalciferol 25 MCG (1000 UT) capsule Take 1,000 Units by mouth daily.   cyanocobalamin 1000 MCG tablet Take 1,000 mcg by mouth daily.   esomeprazole 40 MG capsule Commonly known as: NEXIUM Take 1 capsule (40 mg total) by mouth daily at 12 noon.   fluticasone 110 MCG/ACT inhaler Commonly known as: Flovent HFA Inhale 1 puff into the lungs daily. Started by: Howard Pouch, DO   fluticasone 50 MCG/ACT nasal spray Commonly known as: FLONASE Place into both nostrils daily.   loratadine 10 MG tablet Commonly known as: CLARITIN Take 10 mg by mouth daily as needed.   Magnesium 250 MG Tabs Take 1 tablet by mouth daily.   montelukast 10 MG tablet Commonly known as: SINGULAIR Take 1 tablet (10 mg total) by mouth at bedtime. Started by: Howard Pouch, DO   polyethylene glycol 17 g packet Commonly known as: MIRALAX / GLYCOLAX Take 17 g by mouth daily as needed.   Probiotic Advanced  Caps daily.        All past medical history, surgical history, allergies, family history, immunizations andmedications were updated in the EMR today and reviewed under the history and medication portions of their EMR.     ROS Negative, with the exception of above mentioned in HPI   Objective:  BP 117/74    Pulse 74    Temp 98 F (36.7 C) (Oral)    Ht 5' 4.25" (1.632 m)    Wt 182 lb (82.6 kg)    SpO2 96%    BMI 31.00 kg/m  Body mass index is 31 kg/m.  Physical Exam Vitals and nursing note reviewed.  Constitutional:      General: She is not in acute distress.    Appearance: Normal appearance. She is normal weight. She is not ill-appearing or toxic-appearing.  HENT:     Head: Normocephalic and atraumatic.     Comments: TTP max. Sinus. Mild swelling right max sinus area.     Right Ear: Tympanic membrane, ear canal and external ear normal. There is no impacted cerumen.     Left Ear: Tympanic membrane, ear canal and external ear normal. There is no impacted cerumen.     Nose: No congestion or rhinorrhea.     Mouth/Throat:     Mouth: Mucous membranes are moist.     Pharynx: Oropharynx is clear. No oropharyngeal exudate or posterior oropharyngeal erythema.  Eyes:     General:        Right eye: No discharge.  Left eye: No discharge.     Extraocular Movements: Extraocular movements intact.     Conjunctiva/sclera: Conjunctivae normal.     Pupils: Pupils are equal, round, and reactive to light.  Cardiovascular:     Rate and Rhythm: Normal rate and regular rhythm.     Heart sounds: No murmur heard.   No gallop.  Pulmonary:     Effort: Pulmonary effort is normal. No respiratory distress.     Breath sounds: No wheezing, rhonchi or rales.     Comments: Mildly diminished breath sounds bilaterally. Musculoskeletal:     Cervical back: No tenderness.     Right lower leg: No edema.     Left lower leg: No edema.  Lymphadenopathy:     Cervical: No cervical adenopathy.  Skin:     Findings: No rash.  Neurological:     Mental Status: She is alert and oriented to person, place, and time. Mental status is at baseline.  Psychiatric:        Mood and Affect: Mood normal.        Behavior: Behavior normal.        Thought Content: Thought content normal.        Judgment: Judgment normal.    Assessment/Plan: Kayla Rosales is a 66 y.o. female present for OV for  Reactive airway Patient reported feeling winded and lung exam had mildly decreased breath sounds bilaterally without wheezing.  She had used her albuterol inhaler a few hours prior to appointment. DuoNeb treatment was provided in office and there was an improvement in her symptoms.  I suspect her symptoms are not infectious but reactive airway disease from recent viral infection. Rest, hydrate.  flonase, mucinex  Start the nasal lavage system every other day Continue albuterol every 6-8 hours as needed only.  Should only be used for chest tightness or wheezing. Start steroid inhaler prescribed by asthma and allergy which was a sample.  Once completed she will start Flovent twice daily. If symptoms still remain after 2-3 weeks without any improvement I have also provided her with a prescription of Singulair nightly to start. If cough present it can last up to 6-8 weeks.  F/U 2 weeks if not improved.   Reviewed expectations re: course of current medical issues. Discussed self-management of symptoms. Outlined signs and symptoms indicating need for more acute intervention. Patient verbalized understanding and all questions were answered. Patient received an After-Visit Summary.    Orders Placed This Encounter  Procedures   POC COVID-19 BinaxNow   POCT Influenza A/B   Meds ordered this encounter  Medications   montelukast (SINGULAIR) 10 MG tablet    Sig: Take 1 tablet (10 mg total) by mouth at bedtime.    Dispense:  30 tablet    Refill:  3   fluticasone (FLOVENT HFA) 110 MCG/ACT inhaler    Sig: Inhale 1  puff into the lungs daily.    Dispense:  1 each    Refill:  5   ipratropium-albuterol (DUONEB) 0.5-2.5 (3) MG/3ML nebulizer solution 3 mL   Referral Orders  No referral(s) requested today     Note is dictated utilizing voice recognition software. Although note has been proof read prior to signing, occasional typographical errors still can be missed. If any questions arise, please do not hesitate to call for verification.   electronically signed by:  Howard Pouch, DO  [place]

## 2021-06-19 NOTE — Patient Instructions (Signed)
Great to see you today.  I have refilled the medication(s) we provide.   If labs were collected, we will inform you of lab results once received either by echart message or telephone call.   - echart message- for normal results that have been seen by the patient already.   - telephone call: abnormal results or if patient has not viewed results in their echart.  

## 2021-06-20 ENCOUNTER — Encounter: Payer: Self-pay | Admitting: Family Medicine

## 2021-06-20 NOTE — Telephone Encounter (Signed)
Please advise 

## 2021-07-19 ENCOUNTER — Ambulatory Visit: Payer: Medicare Other | Admitting: Allergy

## 2021-07-31 ENCOUNTER — Ambulatory Visit: Payer: Medicare Other | Admitting: Family Medicine

## 2021-08-03 ENCOUNTER — Ambulatory Visit (INDEPENDENT_AMBULATORY_CARE_PROVIDER_SITE_OTHER): Payer: Medicare Other

## 2021-08-03 ENCOUNTER — Encounter: Payer: Self-pay | Admitting: Family Medicine

## 2021-08-03 ENCOUNTER — Ambulatory Visit (INDEPENDENT_AMBULATORY_CARE_PROVIDER_SITE_OTHER): Payer: Medicare Other | Admitting: Family Medicine

## 2021-08-03 VITALS — BP 112/70 | HR 69 | Temp 98.3°F | Ht 64.25 in | Wt 184.0 lb

## 2021-08-03 DIAGNOSIS — M546 Pain in thoracic spine: Secondary | ICD-10-CM

## 2021-08-03 DIAGNOSIS — Z981 Arthrodesis status: Secondary | ICD-10-CM

## 2021-08-03 DIAGNOSIS — M542 Cervicalgia: Secondary | ICD-10-CM | POA: Diagnosis not present

## 2021-08-03 DIAGNOSIS — M79601 Pain in right arm: Secondary | ICD-10-CM

## 2021-08-03 MED ORDER — PREDNISONE 20 MG PO TABS: ORAL_TABLET | ORAL | 0 refills | Status: DC

## 2021-08-03 NOTE — Progress Notes (Signed)
? ? ? ?This visit occurred during the SARS-CoV-2 public health emergency.  Safety protocols were in place, including screening questions prior to the visit, additional usage of staff PPE, and extensive cleaning of exam room while observing appropriate contact time as indicated for disinfecting solutions.  ? ? ?Kayla Rosales , 01-07-1956, 66 y.o., female ?MRN: [id number] ?Patient Care Team  ?  Relationship Specialty Notifications Start End  ?Ma Hillock, DO PCP - General Family Medicine  01/15/18   ?Elouise Munroe, MD PCP - Cardiology Cardiology Admissions 07/09/19   ?Gatha Mayer, MD Consulting Physician Gastroenterology  01/19/18   ?Princess Bruins, MD Consulting Physician Obstetrics and Gynecology  04/03/20   ? ? ?Chief Complaint  ?Patient presents with  ? Back Pain  ?  Pt c/o upper right back area, radiating to under R arm and heart burn x 3 mos intermittently; pt has seen ortho and was dx with scapular dyskinesia and was tx'd with and Meloxicam and Robaxin pt is not taking muscle relaxer  ? ?  ?Subjective: Pt presents for an OV with complaints of right-sided thoracic pain under her scapula.  She was seen by 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  She was prescribed Robaxin and Mobic.  She has started the Mobic and feels it is helpful but she is still having discomfort underneath her axilla are and arm.  She states this initially started hurting about 3 months ago.  She endorses doing some yard work and pulling weeds at 1 time that may have made it worse.  She has a history of cervical fusion C5-C7 completed in [place] in 2018. ? ? ?  03/19/2021  ?  9:31 AM 10/24/2020  ?  8:53 AM 04/03/2020  ? 10:40 AM 08/17/2019  ?  8:29 AM 05/20/2019  ?  3:01 PM  ?Depression screen PHQ 2/9  ?Decreased Interest 0 0 0 1 0  ?Down, Depressed, Hopeless 0 0 0 1 0  ?PHQ - 2 Score 0 0 0 2 0  ?Altered sleeping 1 0 3 3   ?Tired, decreased energy 1 0 3 3   ?Change in appetite 0 0 0 1   ?Feeling bad or failure  about yourself  0 0 0 1   ?Trouble concentrating 0 0 0 1   ?Moving slowly or fidgety/restless 0 0 0 2   ?Suicidal thoughts 0 0 0 0   ?PHQ-9 Score 2 0 6 13   ?Difficult doing work/chores  Not difficult at all  Somewhat difficult   ? ? ?Allergies  ?Allergen Reactions  ? Hydromorphone Other (See Comments)  ?  HYPOTENSION  ? Meperidine Other (See Comments)  ?  "BP drops"  ? Sulfa Antibiotics Other (See Comments)  ?  States she does not know the reaction ?  ? ?Social History  ? ?Social History Narrative  ? Married second family - 8 kids 3 grandkids blended  ? Bachelors degree, retired Pharmacist, hospital  ? Caffeine 2/day  ? Smoker - never  ? EtOH - rare  ? No drugs  ? Wears her seatbelt, smoke alarm at home.  ? Feels safe in her relationships.  ? ?Past Medical History:  ?Diagnosis Date  ? Allergy   ? Arthritis   ? Asthma   ? Back pain   ? Benign positional vertigo   ? Chicken pox   ? Colon polyps   ? Cough 10/09/2020  ? COVID-19 10/09/2020  ? Diverticulitis   ? Early menopause occurring in patient  age younger than 27 years   ? Fibromyalgia   ? GERD (gastroesophageal reflux disease)   ? Gluten intolerance   ? 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 IBS (irritable bowel syndrome)   ? Internal hemorrhoids 05/2018  ? Joint pain   ? Lumbar herniated disc   ? Neuropathic pain 06/08/2015  ? Last Assessment & Plan:  Formatting of this note might be different from the original. Still unclear what is cuasing her pain. May be nerve related. May benefit from starting on gabapentin. Start gabapentin 300 mg TID. F/u 4 weeks  ? Night sweats   ? Palpitations   ? Recurrent upper respiratory infection (URI)   ? Right arm weakness 09/28/2015  ? Last Assessment & Plan:  Formatting of this note might be different from the original. Referral to PT today. 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 Sleep apnea   ? Snoring 01/04/2019  ? 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 right UE/neck sx, resolved.   ? Vertigo 08/11/2015  ? Last Assessment  & Plan:  Formatting of this note might be different from the original. Concern given gradual onset over the last week and a half in addition to neck pain. Patient needs neuroimaging to rule out dissecting vertebral aneurysm. Patient advised to go to emergency room. She declined ambulance transport  ? Vitamin B 12 deficiency   ? Vitamin D deficiency   ? ?Past Surgical History:  ?Procedure Laterality Date  ? COLONOSCOPY  2015  ? [name]- [place], [place]; 5 yr repeat recommended  ? COLONOSCOPY W/ POLYPECTOMY  05/26/2018  ? x5 polyps  ? KNEE ARTHROSCOPY Bilateral 2013  ? meniscus  x2  ? NECK SURGERY  2018  ? disc fusion  ? UPPER GASTROINTESTINAL ENDOSCOPY    ? ?Family History  ?Problem Relation Age of Onset  ? Depression Mother   ? Heart disease Mother   ? Early death Mother 86  ? Hypertension Mother   ? Thyroid disease Mother   ? Anxiety disorder Mother   ? Obesity Mother   ? Arthritis Father   ? Heart disease Father   ? Depression Sister 49  ? Early death Sister   ? Drug abuse Sister   ? Lung cancer Sister   ? Mental illness Sister   ? COPD Sister   ? Diabetes Maternal Grandmother   ? Early death Maternal Grandmother   ? Early death Maternal Grandfather   ? Heart disease Maternal Grandfather   ? Breast cancer Cousin   ? Colon cancer Neg Hx   ? Esophageal cancer Neg Hx   ? Stomach cancer Neg Hx   ? Pancreatic cancer Neg Hx   ? Rectal cancer Neg Hx   ? ?Allergies as of 08/03/2021   ? ?   Reactions  ? Hydromorphone Other (See Comments)  ? HYPOTENSION  ? Meperidine Other (See Comments)  ? "BP drops"  ? Sulfa Antibiotics Other (See Comments)  ? States she does not know the reaction  ? ?  ? ?  ?Medication List  ?  ? ?  ? Accurate as of August 03, 2021 12:48 PM. If you have any questions, ask your nurse or doctor.  ?  ?  ? ?  ? ?STOP taking these medications   ? ?montelukast 10 MG tablet ?Commonly known as: SINGULAIR ?Stopped by: Howard Pouch, DO ?  ? ?  ? ?TAKE these medications   ? ?albuterol 108 (90 Base) MCG/ACT  inhaler ?  Commonly known as: VENTOLIN HFA ?Inhale 2 puffs into the lungs every 6 (six) hours as needed for wheezing or shortness of breath. ?  ?ALIVE WOMENS 50+ PO ?Take by mouth daily. ?  ?atorvastatin 20 MG tablet ?Commonly known as: LIPITOR ?Take 1 tablet (20 mg total) by mouth daily. ?  ?azelastine 0.1 % nasal spray ?Commonly known as: ASTELIN ?Place into both nostrils 2 (two) times daily. Use in each nostril as directed ?  ?Benefiber Powd ?Take 5 mg by mouth daily. ?  ?Cholecalciferol 25 MCG (1000 UT) capsule ?Take 1,000 Units by mouth daily. ?  ?cyanocobalamin 1000 MCG tablet ?Take 1,000 mcg by mouth daily. ?  ?esomeprazole 40 MG capsule ?Commonly known as: [place] ?Take 1 capsule (40 mg total) by mouth daily at 12 noon. ?  ?fluticasone 110 MCG/ACT inhaler ?Commonly known as: Flovent HFA ?Inhale 1 puff into the lungs daily. ?  ?fluticasone 50 MCG/ACT nasal spray ?Commonly known as: FLONASE ?Place into both nostrils daily. ?  ?loratadine 10 MG tablet ?Commonly known as: CLARITIN ?Take 10 mg by mouth daily as needed. ?  ?Magnesium 250 MG Tabs ?Take 1 tablet by mouth daily. ?  ?meloxicam 15 MG tablet ?Commonly known as: MOBIC ?Take by mouth. ?  ?polyethylene glycol 17 g packet ?Commonly known as: MIRALAX / GLYCOLAX ?Take 17 g by mouth daily as needed. ?  ?predniSONE 20 MG tablet ?Commonly known as: DELTASONE ?60 mg x3d, 40 mg x3d, 20 mg x2d, 10 mg x2d ?Started by: Howard Pouch, DO ?  ?Probiotic Advanced Caps ?daily. ?  ? ?  ? ? ?All past medical history, surgical history, allergies, family history, immunizations andmedications were updated in the EMR today and reviewed under the history and medication portions of their EMR.    ? ?ROS ?Negative, with the exception of above mentioned in HPI ? ? ?Objective:  ?BP 112/70   Pulse 69   Temp 98.3 ?F (36.8 ?C) (Oral)   Ht 5' 4.25" (1.632 m)   Wt 184 lb (83.5 kg)   SpO2 97%   BMI 31.34 kg/m?  ?Body mass index is 31.34 kg/m?Marland Kitchen ?Physical Exam ?Vitals and nursing note  reviewed.  ?Constitutional:   ?   General: She is not in acute distress. ?   Appearance: Normal appearance. She is normal weight. She is not ill-appearing or toxic-appearing.  ?HENT:  ?   Head: Normocephalic and atraumati

## 2021-08-03 NOTE — Patient Instructions (Signed)
[facility] ?[address] [place], [place] [address] ? ?Start prednisone taper  ?We will call you with xray results.  ? ? ? ?Radicular Pain ?Radicular pain is a type of pain that spreads from your back or neck along a spinal nerve. Spinal nerves are nerves that leave the spinal cord and go to the muscles. Radicular pain is sometimes called radiculopathy, radiculitis, or a pinched nerve. When you have this type of pain, you may also have weakness, numbness, or tingling in the area of your body that is supplied by the nerve. The pain may feel sharp and burning. Depending on which spinal nerve is affected, the pain may occur in the: ?Neck area (cervical radicular pain). You may also feel pain, numbness, weakness, or tingling in the arms. ?Mid-spine area (thoracic radicular pain). You would feel this pain in the back and chest. This type is rare. ?Lower back area (lumbar radicular pain). You would feel this pain as low back pain. You may feel pain, numbness, weakness, or tingling in the buttocks or legs. Sciatica is a type of lumbar radicular pain that shoots down the back of the leg. ?Radicular pain occurs when one of the spinal nerves becomes irritated or squeezed (compressed). It is often caused by something pushing on a spinal nerve, such as one of the bones of the spine (vertebrae) or one of the round cushions between vertebrae (intervertebral disks). This can result from: ?An injury. ?Wear and tear or aging of a disk. ?The growth of a bone spur that pushes on the nerve. ?Radicular pain often goes away when you follow instructions from your health care provider for relieving pain at home. ?How is this treated? ?Treatment may depend on the cause of the condition and may include: ?Working with a physical therapist. ?Taking pain medicine. ?Applying heat or ice or both to the affected areas. ?Doing stretches to improve flexibility. ?Having surgery. This may be needed if other treatments do not  help. Different types of surgery may be done depending on the cause of this condition. ?Follow these instructions at home: ?Managing pain ? ?  ? ?If directed, put ice on the affected area. To do this: ?Put ice in a plastic bag. ?Place a towel between your skin and the bag. ?Leave the ice on for 20 minutes, 2-3 times a day. ?Remove the ice if your skin turns bright red. This is very important. If you cannot feel pain, heat, or cold, you have a greater risk of damage to the area. ?If directed, apply heat to the affected area as often as told by your health care provider. Use the heat source that your health care provider recommends, such as a moist heat pack or a heating pad. ?Place a towel between your skin and the heat source. ?Leave the heat on for 20-30 minutes. ?Remove the heat if your skin turns bright red. This is especially important if you are unable to feel pain, heat, or cold. You have a greater risk of getting burned. ?Activity ?Do not sit or rest in bed for long periods of time. ?Try to stay as active as possible. Ask your health care provider what type of exercise or activity is best for you. ?Avoid activities that make your pain worse, such as bending and lifting. ?You may have to avoid lifting. Ask your health care provider how much you can safely lift. ?Practice using proper technique when lifting items. Proper lifting technique involves bending your knees and rising up. ?Do strength  and range-of-motion exercises only as told by your health care provider or physical therapist. ?General instructions ?Take over-the-counter and prescription medicines only as told by your health care provider. ?Pay attention to any changes in your symptoms. ?Keep all follow-up visits. This is important. ?Contact a health care provider if: ?Your pain and other symptoms get worse. ?Your pain medicine is not helping. ?Your pain has not improved after a few weeks of home care. ?You have a fever. ?Get help right away if: ?You  have severe pain, weakness, or numbness. ?You have difficulty with bladder or bowel control. ?Summary ?Radicular pain is a type of pain that spreads from your back or neck along a spinal nerve. ?When you have radicular pain, you may also have weakness, numbness, or tingling in the area of your body that is supplied by the nerve. ?The pain may feel sharp or burning. ?Radicular pain may be treated with ice, heat, medicines, or physical therapy. ?This information is not intended to replace advice given to you by your health care provider. Make sure you discuss any questions you have with your health care provider. ?Document Revised: 10/05/2020 Document Reviewed: 10/05/2020 ?Elsevier Patient Education ? [place]. ? ? ? ?

## 2021-08-06 ENCOUNTER — Telehealth: Payer: Self-pay | Admitting: Family Medicine

## 2021-08-06 ENCOUNTER — Encounter: Payer: Self-pay | Admitting: Family Medicine

## 2021-08-06 DIAGNOSIS — Z981 Arthrodesis status: Secondary | ICD-10-CM

## 2021-08-06 DIAGNOSIS — M5412 Radiculopathy, cervical region: Secondary | ICD-10-CM

## 2021-08-06 DIAGNOSIS — M546 Pain in thoracic spine: Secondary | ICD-10-CM

## 2021-08-06 DIAGNOSIS — M542 Cervicalgia: Secondary | ICD-10-CM

## 2021-08-06 DIAGNOSIS — M79601 Pain in right arm: Secondary | ICD-10-CM

## 2021-08-06 NOTE — Telephone Encounter (Signed)
Patient returning [name]'s call. Kayla Rosales was taking rooming another patient. ?Please return call whenever you are available ?

## 2021-08-06 NOTE — Telephone Encounter (Signed)
Referral placed.

## 2021-08-06 NOTE — Telephone Encounter (Signed)
LVM for pt to CB regarding results.  

## 2021-08-06 NOTE — Telephone Encounter (Signed)
Please advise 

## 2021-08-06 NOTE — Telephone Encounter (Signed)
Spoke with pt regarding results and instructions. Pt has not have anyone in mind at this time.  ?

## 2021-08-06 NOTE — Addendum Note (Signed)
Addended by: Kavin Leech on: 08/06/2021 02:38 PM ? ? Modules accepted: Orders ? ?

## 2021-08-06 NOTE — Addendum Note (Signed)
Addended by: Howard Pouch A on: 08/06/2021 05:21 PM ? ? Modules accepted: Orders ? ?

## 2021-08-06 NOTE — Telephone Encounter (Signed)
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  It is recommending an advanced image study such as an MRI or CT for better cross-sectional views. ?Options are to move forward and try to get this approved now versus referring her to neurosurgeon as we discussed during her visit. ? ?Given that she has had a fusion at this level, I would recommend that we get her into neurosurgery so that they can further evaluate her and order a study they feel is pertinent.  Please ask her if she has a particular neurosurgeon she would like to be referred to. ?

## 2021-08-14 ENCOUNTER — Telehealth: Payer: Self-pay

## 2021-08-14 ENCOUNTER — Ambulatory Visit (INDEPENDENT_AMBULATORY_CARE_PROVIDER_SITE_OTHER): Payer: Medicare Other | Admitting: Family Medicine

## 2021-08-14 ENCOUNTER — Encounter: Payer: Self-pay | Admitting: Family Medicine

## 2021-08-14 VITALS — BP 123/72 | HR 88 | Temp 98.6°F | Ht 64.25 in | Wt 184.0 lb

## 2021-08-14 DIAGNOSIS — J310 Chronic rhinitis: Secondary | ICD-10-CM | POA: Diagnosis not present

## 2021-08-14 DIAGNOSIS — R051 Acute cough: Secondary | ICD-10-CM

## 2021-08-14 DIAGNOSIS — K219 Gastro-esophageal reflux disease without esophagitis: Secondary | ICD-10-CM | POA: Diagnosis not present

## 2021-08-14 DIAGNOSIS — K449 Diaphragmatic hernia without obstruction or gangrene: Secondary | ICD-10-CM

## 2021-08-14 DIAGNOSIS — J4520 Mild intermittent asthma, uncomplicated: Secondary | ICD-10-CM

## 2021-08-14 MED ORDER — HYDROXYZINE HCL 10 MG PO TABS: 10.0000 mg | ORAL_TABLET | Freq: Every evening | ORAL | 0 refills | Status: DC | PRN

## 2021-08-14 MED ORDER — AZITHROMYCIN 250 MG PO TABS: ORAL_TABLET | ORAL | 0 refills | Status: AC

## 2021-08-14 MED ORDER — MONTELUKAST SODIUM 10 MG PO TABS: 10.0000 mg | ORAL_TABLET | Freq: Every day | ORAL | 1 refills | Status: DC

## 2021-08-14 NOTE — Progress Notes (Signed)
? ? ? ?This visit occurred during the SARS-CoV-2 public health emergency.  Safety protocols were in place, including screening questions prior to the visit, additional usage of staff PPE, and extensive cleaning of exam room while observing appropriate contact time as indicated for disinfecting solutions.  ? ? ?Kayla Rosales , 1955-05-20, 66 y.o., female ?MRN: [id number] ?Patient Care Team  ?  Relationship Specialty Notifications Start End  ?Ma Hillock, DO PCP - General Family Medicine  01/15/18   ?Elouise Munroe, MD PCP - Cardiology Cardiology Admissions 07/09/19   ?Gatha Mayer, MD Consulting Physician Gastroenterology  01/19/18   ?Princess Bruins, MD Consulting Physician Obstetrics and Gynecology  04/03/20   ? ? ?Chief Complaint  ?Patient presents with  ? Cough  ?  Pt c/o cough, nasal congestion, nasal drainage and post nasal x 10 days  ? ?  ?Subjective: Kayla Rosales is a 66 y.o. female present for cough, nasal congestion, nasal drainage and postnasal drip for approximately 10 days or more.  He denies fevers or chills.  He has had recurrent bronchitis and sinusitis since COVID last summer.  She also watches her grandchildren frequently.  She started Claritin when symptoms started.  She has not started the Singulair prescribed last visit.  She has not started the Flovent, but did use the albuterol inhaler. ? ?Prior note ?cough, chest tightness and hoarseness of 4 days duration.  She states she really does not feel all that bad but feels "winded.  "She has had multiple episodes of bronchitis and sinusitis since her COVID infection in June 2022.  This being her fifth episode of symptoms.  She was last seen approximately 1 month ago and treated for sinusitis and she reports she did have complete resolution of symptoms after treatment.  She has also solve the allergy group and they recommended she start a trial of steroid inhaler.  She admits she never did start the inhaler. ? ?Prior note: ?Pt presents for  an OV with complaints of facial pain, nasal congestion, ear fullness, chills and fatigue for over 14 days.  Patient reports she feels like she has pressure over her maxillary sinuses and her eyes.  She denies cough or shortness of breath.  She denies GI symptoms.  She reports she has a dental implant for the past 2 months on the right upper side.  This had been causing sinus pain from the root being infected.  Therefore the tooth was pulled and a dental implant had been placed.  Some of the discomfort is coming from the location of where she was feeling the abscess in her right sinus/maxillary area.  She denies any current fevers.  She is tolerating p.o.  She has follow-up with her dentist in a couple weeks for a crown. ? ? ?  03/19/2021  ?  9:31 AM 10/24/2020  ?  8:53 AM 04/03/2020  ? 10:40 AM 08/17/2019  ?  8:29 AM 05/20/2019  ?  3:01 PM  ?Depression screen PHQ 2/9  ?Decreased Interest 0 0 0 1 0  ?Down, Depressed, Hopeless 0 0 0 1 0  ?PHQ - 2 Score 0 0 0 2 0  ?Altered sleeping 1 0 3 3   ?Tired, decreased energy 1 0 3 3   ?Change in appetite 0 0 0 1   ?Feeling bad or failure about yourself  0 0 0 1   ?Trouble concentrating 0 0 0 1   ?Moving slowly or fidgety/restless 0 0 0 2   ?  Suicidal thoughts 0 0 0 0   ?PHQ-9 Score 2 0 6 13   ?Difficult doing work/chores  Not difficult at all  Somewhat difficult   ? ? ?Allergies  ?Allergen Reactions  ? Hydromorphone Other (See Comments)  ?  HYPOTENSION  ? Meperidine Other (See Comments)  ?  "BP drops"  ? Sulfa Antibiotics Other (See Comments)  ?  States she does not know the reaction ?  ? ?Social History  ? ?Social History Narrative  ? Married second family - 8 kids 51 grandkids blended  ? Bachelors degree, retired Pharmacist, hospital  ? Caffeine 2/day  ? Smoker - never  ? EtOH - rare  ? No drugs  ? Wears her seatbelt, smoke alarm at home.  ? Feels safe in her relationships.  ? ?Past Medical History:  ?Diagnosis Date  ? Allergy   ? Arthritis   ? Asthma   ? Back pain   ? Benign positional vertigo    ? Chicken pox   ? Colon polyps   ? Cough 10/09/2020  ? COVID-19 10/09/2020  ? Diverticulitis   ? Early menopause occurring in patient age younger than 21 years   ? Fibromyalgia   ? GERD (gastroesophageal reflux disease)   ? Gluten intolerance   ? 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 IBS (irritable bowel syndrome)   ? Internal hemorrhoids 05/2018  ? Joint pain   ? Lumbar herniated disc   ? Neuropathic pain 06/08/2015  ? Last Assessment & Plan:  Formatting of this note might be different from the original. Still unclear what is cuasing her pain. May be nerve related. May benefit from starting on gabapentin. Start gabapentin 300 mg TID. F/u 4 weeks  ? Night sweats   ? Palpitations   ? Recurrent upper respiratory infection (URI)   ? Right arm weakness 09/28/2015  ? Last Assessment & Plan:  Formatting of this note might be different from the original. Referral to PT today. 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 Sleep apnea   ? Snoring 01/04/2019  ? 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 right UE/neck sx, resolved.   ? Vertigo 08/11/2015  ? Last Assessment & Plan:  Formatting of this note might be different from the original. Concern given gradual onset over the last week and a half in addition to neck pain. Patient needs neuroimaging to rule out dissecting vertebral aneurysm. Patient advised to go to emergency room. She declined ambulance transport  ? Vitamin B 12 deficiency   ? Vitamin D deficiency   ? ?Past Surgical History:  ?Procedure Laterality Date  ? COLONOSCOPY  2015  ? [name]- [place], [place]; 5 yr repeat recommended  ? COLONOSCOPY W/ POLYPECTOMY  05/26/2018  ? x5 polyps  ? KNEE ARTHROSCOPY Bilateral 2013  ? meniscus  x2  ? NECK SURGERY  2018  ? disc fusion  ? UPPER GASTROINTESTINAL ENDOSCOPY    ? ?Family History  ?Problem Relation Age of Onset  ? Depression Mother   ? Heart disease Mother   ? Early death Mother 36  ? Hypertension Mother   ? Thyroid disease Mother   ? Anxiety disorder  Mother   ? Obesity Mother   ? Arthritis Father   ? Heart disease Father   ? Depression Sister 42  ? Early death Sister   ? Drug abuse Sister   ? Lung cancer Sister   ? Mental illness Sister   ? COPD Sister   ? Diabetes  Maternal Grandmother   ? Early death Maternal Grandmother   ? Early death Maternal Grandfather   ? Heart disease Maternal Grandfather   ? Breast cancer Cousin   ? Colon cancer Neg Hx   ? Esophageal cancer Neg Hx   ? Stomach cancer Neg Hx   ? Pancreatic cancer Neg Hx   ? Rectal cancer Neg Hx   ? ?Allergies as of 08/14/2021   ? ?   Reactions  ? Hydromorphone Other (See Comments)  ? HYPOTENSION  ? Meperidine Other (See Comments)  ? "BP drops"  ? Sulfa Antibiotics Other (See Comments)  ? States she does not know the reaction  ? ?  ? ?  ?Medication List  ?  ? ?  ? Accurate as of Aug 14, 2021 11:59 PM. If you have any questions, ask your nurse or doctor.  ?  ?  ? ?  ? ?STOP taking these medications   ? ?meloxicam 15 MG tablet ?Commonly known as: MOBIC ?Stopped by: Howard Pouch, DO ?  ?polyethylene glycol 17 g packet ?Commonly known as: MIRALAX / GLYCOLAX ?Stopped by: Howard Pouch, DO ?  ?predniSONE 20 MG tablet ?Commonly known as: DELTASONE ?Stopped by: Howard Pouch, DO ?  ? ?  ? ?TAKE these medications   ? ?albuterol 108 (90 Base) MCG/ACT inhaler ?Commonly known as: VENTOLIN HFA ?Inhale 2 puffs into the lungs every 6 (six) hours as needed for wheezing or shortness of breath. ?  ?ALIVE WOMENS 50+ PO ?Take by mouth daily. ?  ?atorvastatin 20 MG tablet ?Commonly known as: LIPITOR ?Take 1 tablet (20 mg total) by mouth daily. ?  ?azelastine 0.1 % nasal spray ?Commonly known as: ASTELIN ?Place into both nostrils 2 (two) times daily. Use in each nostril as directed ?  ?azithromycin 250 MG tablet ?Commonly known as: ZITHROMAX ?Take 2 tablets on day 1, then 1 tablet daily on days 2 through 5 ?Started by: Howard Pouch, DO ?  ?Benefiber Powd ?Take 5 mg by mouth daily. ?  ?Cholecalciferol 25 MCG (1000 UT) capsule ?Take  1,000 Units by mouth daily. ?  ?cyanocobalamin 1000 MCG tablet ?Take 1,000 mcg by mouth daily. ?  ?esomeprazole 40 MG capsule ?Commonly known as: Torboy ?Take 1 capsule (40 mg total) by mouth daily at 12

## 2021-08-14 NOTE — Telephone Encounter (Signed)
Appt scheduled with provider ? ?[facility] Client ?Nonclinical Telephone Record  ?[facility]? ?Client [facility] - Client ?Client Site [facility] - Day ?Provider Howard Pouch ?Contact Type Call ?Who Is Calling Patient / Member / Family / Caregiver ?Caller Name Chania Kochanski ?Caller Phone Number [phone] ?Patient Name Kayla Rosales ?Patient DOB Feb 24, 1956 ?Call Type Message Only Information Provided ?Reason for Call Request to Schedule Office Appointment ?Initial Comment Caller states she wants to make an appt. ?Disp. Time Disposition Final User ?08/14/2021 8:04:55 AM General Information Provided Yes Liana Crocker, Chloe-Jade ?Call Closed By: Cathlean Sauer ?Transaction Date/Time: 08/14/2021 8:03:05 AM (ET) ?

## 2021-08-14 NOTE — Patient Instructions (Addendum)
Start xyzal or zyrtec nightly.  ?Vistaril- helpful for sleep ? ?Take flovent inhaler daily as prescribed.  ?Start singulair nightly.  ? ? ?Z pack called in for you. ? ? ?

## 2021-08-15 ENCOUNTER — Encounter: Payer: Self-pay | Admitting: Family Medicine

## 2021-08-16 ENCOUNTER — Other Ambulatory Visit: Payer: Self-pay | Admitting: Family Medicine

## 2021-08-17 ENCOUNTER — Telehealth: Payer: Self-pay | Admitting: Family Medicine

## 2021-08-17 LAB — POC COVID19 BINAXNOW: SARS Coronavirus 2 Ag: NEGATIVE

## 2021-08-17 MED ORDER — PREDNISONE 50 MG PO TABS: 50.0000 mg | ORAL_TABLET | Freq: Every day | ORAL | 0 refills | Status: DC

## 2021-08-17 NOTE — Telephone Encounter (Signed)
Pt call has already sent to provider. Awaiting response.  ?

## 2021-08-17 NOTE — Telephone Encounter (Signed)
Please return patient's call concerning her wheezing. ? ?I have called in a prednisone burst of 1 tab daily for 5 days.  This should help with the wheezing while giving time for the daily inhaler and use to have the chance to take action. ?Continue the allergy regimen plus the Singulair and the daily Flovent inhaler (twice a day). ? ? ?Use the albuterol inhaler 1 to 2 puffs every 8 hours for wheezing if it is present, otherwise do not routinely use this unless wheezing or short of breath. ? ?It will take a couple days to a week of the medications taken routinely, especially the inhaler to see the improvements. ? ?

## 2021-08-17 NOTE — Telephone Encounter (Signed)
No action needed

## 2021-08-17 NOTE — Telephone Encounter (Signed)
Please see other encounter.

## 2021-08-17 NOTE — Telephone Encounter (Signed)
Please advise 

## 2021-08-17 NOTE — Telephone Encounter (Signed)
Patient called to ask if she needs to be seen again for cough. Not any better, wheezing.  ? ?Please call as soon as possible, if she needs appt we will be able to accommodate her today. ? ?Patient can be reached at [phone] ?

## 2021-08-20 ENCOUNTER — Ambulatory Visit: Payer: Medicare Other | Admitting: Internal Medicine

## 2021-08-21 ENCOUNTER — Encounter: Payer: Self-pay | Admitting: Family Medicine

## 2021-08-27 ENCOUNTER — Encounter: Payer: Self-pay | Admitting: Internal Medicine

## 2021-08-27 ENCOUNTER — Ambulatory Visit (INDEPENDENT_AMBULATORY_CARE_PROVIDER_SITE_OTHER): Payer: Medicare Other | Admitting: Internal Medicine

## 2021-08-27 VITALS — BP 128/82 | HR 70 | Ht 65.5 in | Wt 186.0 lb

## 2021-08-27 DIAGNOSIS — Z136 Encounter for screening for cardiovascular disorders: Secondary | ICD-10-CM

## 2021-08-27 DIAGNOSIS — R072 Precordial pain: Secondary | ICD-10-CM

## 2021-08-27 DIAGNOSIS — R7309 Other abnormal glucose: Secondary | ICD-10-CM

## 2021-08-27 DIAGNOSIS — M5489 Other dorsalgia: Secondary | ICD-10-CM | POA: Diagnosis not present

## 2021-08-27 DIAGNOSIS — Z6830 Body mass index (BMI) 30.0-30.9, adult: Secondary | ICD-10-CM

## 2021-08-27 DIAGNOSIS — R9431 Abnormal electrocardiogram [ECG] [EKG]: Secondary | ICD-10-CM

## 2021-08-27 MED ORDER — METOPROLOL TARTRATE 50 MG PO TABS: 50.0000 mg | ORAL_TABLET | Freq: Once | ORAL | 0 refills | Status: DC

## 2021-08-27 NOTE — Patient Instructions (Signed)
Medication Instructions:  ?PLEASE TAKE METOPROLOL TARTRATE '50mg'$  TWO HOURS PRIOR TO CCTA SCAN  ?*If you need a refill on your cardiac medications before your next appointment, please call your pharmacy* ? ?Lab Work: ?PLEASE RETURN FOR BLOOD WORK- 1 WEEK PRIOR TO CTA SCAN. PLEASE FAST PRIOR TO THESE  ?If you have labs (blood work) drawn today and your tests are completely normal, you will receive your results only by: ?MyChart Message (if you have MyChart) OR ?A paper copy in the mail ?If you have any lab test that is abnormal or we need to change your treatment, we will call you to review the results. ? ?Testing/Procedures: ?Your physician has requested that you have cardiac CT. Cardiac computed tomography (CT) is a painless test that uses an x-ray machine to take clear, detailed pictures of your heart. For further information please visit [email or web address]. Please follow instruction sheet as given. ? ?Follow-Up: ?At [facility], you and your health needs are our priority.  As part of our continuing mission to provide you with exceptional heart care, we have created designated Provider Care Teams.  These Care Teams include your primary Cardiologist (physician) and Advanced Practice Providers (APPs -  Physician Assistants and Nurse Practitioners) who all work together to provide you with the care you need, when you need it. ? ?Your next appointment:   ?6 month(s) ? ?The format for your next appointment:   ?In Person ? ?Provider:   ?Elouise Munroe, MD   ? ?Other Instructions ? ? ?Your cardiac CT will be scheduled at one of the below locations:  ? ?[facility] ?[address] ?[place], [place] [address] ?[phone] ? ?If scheduled at [facility], please arrive at the [facility] and Children's Entrance (Entrance C2) of [facility] 30 minutes prior to test start time. ?You can use the FREE valet parking offered at entrance C (encouraged to control the heart rate for the test)  ?Proceed  to the [facility] Radiology Department (first floor) to check-in and test prep. ? ?All radiology patients and guests should use entrance C2 at [facility], accessed from [facility], even though the hospital's physical address listed is [address] ? ? ? ?Please follow these instructions carefully (unless otherwise directed): ? ?On the Night Before the Test: ?Be sure to Drink plenty of water. ?Do not consume any caffeinated/decaffeinated beverages or chocolate 12 hours prior to your test. ?Do not take any antihistamines 12 hours prior to your test. ? ?On the Day of the Test: ?Drink plenty of water until 1 hour prior to the test. ?Do not eat any food 4 hours prior to the test. ?You may take your regular medications prior to the test.  ?Take metoprolol (Lopressor) two hours prior to test. ?HOLD Furosemide/Hydrochlorothiazide morning of the test. ?FEMALES- please wear underwire-free bra if available, avoid dresses & tight clothing ? ?After the Test: ?Drink plenty of water. ?After receiving IV contrast, you may experience a mild flushed feeling. This is normal. ?On occasion, you may experience a mild rash up to 24 hours after the test. This is not dangerous. If this occurs, you can take Benadryl 25 mg and increase your fluid intake. ?If you experience trouble breathing, this can be serious. If it is severe call 911 IMMEDIATELY. If it is mild, please call our office. ?If you take any of these medications: Glipizide/Metformin, Avandament, Glucavance, please do not take 48 hours after completing test unless otherwise instructed. ? ?We will  call to schedule your test 2-4 weeks out understanding that some insurance companies will need an authorization prior to the service being performed.  ? ?For non-scheduling related questions, please contact the cardiac imaging nurse navigator should you have any questions/concerns: ?Marchia Bond, Cardiac Imaging Nurse Navigator ?Gordy Clement, Cardiac  Imaging Nurse Navigator ?[facility] ?Direct Office Dial: [phone]  ? ?For scheduling needs, including cancellations and rescheduling, please call [place], [phone]. ? ? ? ? ? ? ?  ?

## 2021-08-27 NOTE — Progress Notes (Signed)
?Cardiology Office Note:   ? ?Date:  08/27/2021  ? ?ID:  Kayla Rosales, DOB [birth year], MRN [id number] ? ?PCP:  Howard Pouch A, DO  ?Cardiologist:  Elouise Munroe, MD  ?Electrophysiologist:  None  ? ?Referring MD: Ma Hillock, DO  ? ?Chief Complaint/Reason for Referral: ?Follow up; palpitations, murmur ? ?History of Present Illness:   ? ?Kayla Rosales is a 66 y.o. female with a history of stroke in 2017 while on HRT due to early menopause, with no residual deficits, mild OSA with possible plan for septorhinoplasty, improved with weight loss, heart murmur with no valve disease on echo, and palpitations with infrequent SVT and ectopy, not requiring treatment. She presents for follow up.  ? ?At her last appointment she was feeling well, but was caring for her elderly father who had some health setbacks. She had been following with [facility] and lost weight, which gave her more energy. She lost 20 lbs, and tried to make dietary changes. A1C 5.1 %, LDL 65, triglycerides 63, HDL 40. We have been using atorvastatin 20 mg daily for best ASCVD risk reduction, she has done well on this dose.  ? ?Today: ?She reports not feeling well since last year. In June 2022 she had COVID and developed subsequent pneumonia. She also developed recurrent bronchitis.  During this time she experienced back pain which was attributed to interscapular discomfort, however may also have been related to bronchitis or pneumonia.  This discomfort appeared prior to significant coughing and raised her concern for a cardiovascular etiology.  She does have a calcium score from 2017 which showed left main coronary artery calcifications of 103.  She has not had interval stress testing. ? ?Lately she has been taking prednisone for a persistent cough. This medication has been causing disruptions to her sleep. She also notes that since her COVID infection her activity has suffered and she gained weight. Generally she does feel  some fatigue and sluggishness, which she attributes to the weight gain. Last week she finally felt like she was improving. She plans to return to exercising and build up her strength. ? ?She confirms that her blood pressure is generally well controlled at her PCP visits. However, her pressures have been higher while on prednisone.  She had also noticed some LE edema recently which she believes was a side effect of the prednisone. At this time she denies any issues with swelling. ? ?Additionally she has been concerned about her diet. She states that she can feel hungry even if she eats an entire meal. She drinks adequate liquids as well. Currently she is on Lipitor 20 mg. Her lab work showed LDL 65, triglycerides 90. ? ?She denies any palpitations, or chest pain. No lightheadedness, headaches, syncope, orthopnea, or PND. ? ? ?Past Medical History:  ?Diagnosis Date  ? Allergy   ? Arthritis   ? Asthma   ? Back pain   ? Benign positional vertigo   ? Chicken pox   ? Colon polyps   ? Cough 10/09/2020  ? COVID-19 10/09/2020  ? Diverticulitis   ? Early menopause occurring in patient age younger than 3 years   ? Fibromyalgia   ? GERD (gastroesophageal reflux disease)   ? Gluten intolerance   ? 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 IBS (irritable bowel syndrome)   ? Internal hemorrhoids 05/2018  ? Joint pain   ? Lumbar herniated disc   ? Neuropathic pain 06/08/2015  ? Last Assessment &  Plan:  Formatting of this note might be different from the original. Still unclear what is cuasing her pain. May be nerve related. May benefit from starting on gabapentin. Start gabapentin 300 mg TID. F/u 4 weeks  ? Night sweats   ? Palpitations   ? Recurrent upper respiratory infection (URI)   ? Right arm weakness 09/28/2015  ? Last Assessment & Plan:  Formatting of this note might be different from the original. Referral to PT today. 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 Sleep apnea   ? Snoring 01/04/2019  ? Stroke  (cerebrum) ([place]) 2017  ? right UE/neck sx, resolved.   ? Vertigo 08/11/2015  ? Last Assessment & Plan:  Formatting of this note might be different from the original. Concern given gradual onset over the last week and a half in addition to neck pain. Patient needs neuroimaging to rule out dissecting vertebral aneurysm. Patient advised to go to emergency room. She declined ambulance transport  ? Vitamin B 12 deficiency   ? Vitamin D deficiency   ? ? ?Past Surgical History:  ?Procedure Laterality Date  ? COLONOSCOPY  2015  ? [name]- [place], [place]; 5 yr repeat recommended  ? COLONOSCOPY W/ POLYPECTOMY  05/26/2018  ? x5 polyps  ? KNEE ARTHROSCOPY Bilateral 2013  ? meniscus  x2  ? NECK SURGERY  2018  ? disc fusion  ? UPPER GASTROINTESTINAL ENDOSCOPY    ? ? ?Current Medications: ?Current Meds  ?Medication Sig  ? albuterol (VENTOLIN HFA) 108 (90 Base) MCG/ACT inhaler TAKE 2 PUFFS BY MOUTH EVERY 6 HOURS AS NEEDED FOR WHEEZE OR SHORTNESS OF BREATH  ? atorvastatin (LIPITOR) 20 MG tablet Take 1 tablet (20 mg total) by mouth daily.  ? cetirizine (ZYRTEC) 10 MG tablet Take 10 mg by mouth daily. Take 1 tablet by mouth daily for allergies.  ? Cholecalciferol 25 MCG (1000 UT) capsule Take 1,000 Units by mouth daily.  ? cyanocobalamin 1000 MCG tablet Take 1,000 mcg by mouth daily.  ? esomeprazole (NEXIUM) 40 MG capsule Take 1 capsule (40 mg total) by mouth daily at 12 noon.  ? fluticasone (FLONASE) 50 MCG/ACT nasal spray Place into both nostrils daily.  ? fluticasone (FLOVENT HFA) 110 MCG/ACT inhaler Inhale 1 puff into the lungs daily.  ? Magnesium 250 MG TABS Take 1 tablet by mouth daily.  ? metoprolol tartrate (LOPRESSOR) 50 MG tablet Take 1 tablet (50 mg total) by mouth once for 1 dose. PLEASE TAKE METOPROLOL 2  HOURS PRIOR TO CTA SCAN.  ? Multiple Vitamins-Minerals (ALIVE WOMENS 50+ PO) Take by mouth daily.  ? polyethylene glycol powder (GLYCOLAX/MIRALAX) 17 GM/SCOOP powder Take by mouth.  ? Probiotic Product (PROBIOTIC  ADVANCED) CAPS daily.   ? Wheat Dextrin (BENEFIBER) POWD Take 5 mg by mouth daily.  ?  ? ?Allergies:   Hydromorphone, Meperidine, and Sulfa antibiotics  ? ?Social History  ? ?Tobacco Use  ? Smoking status: Never  ?  Passive exposure: Never  ? Smokeless tobacco: Never  ?Vaping Use  ? Vaping Use: Never used  ?Substance Use Topics  ? Alcohol use: Yes  ?  Alcohol/week: 1.0 standard drink  ?  Types: 1 Glasses of wine per week  ? Drug use: Never  ?  ? ?Family History: ?The patient's family history includes Anxiety disorder in her mother; Arthritis in her father; Breast cancer in her cousin; COPD in her sister; Depression in her mother; Depression (age of onset: 36) in her sister; Diabetes in her  maternal grandmother; Drug abuse in her sister; Early death in her maternal grandfather, maternal grandmother, and sister; Early death (age of onset: 50) in her mother; Heart disease in her father, maternal grandfather, and mother; Hypertension in her mother; Lung cancer in her sister; Mental illness in her sister; Obesity in her mother; Thyroid disease in her mother. There is no history of Colon cancer, Esophageal cancer, Stomach cancer, Pancreatic cancer, or Rectal cancer. ? ?ROS:   ?Please see the history of present illness.    ?(+) Cough ?(+) Insomnia ?(+) Fatigue/Malaise ?(+) Weight gain ?All other systems reviewed and are negative. ? ?EKGs/Labs/Other Studies Reviewed:   ? ?The following studies were reviewed today: ? ?CT Abdomen/Pelvis 01/04/2021: ?FINDINGS: ?Lower chest: No acute abnormality. ?  ?Hepatobiliary: No suspicious hepatic lesion. Calcified hepatic ?granulomata. Gallbladder is unremarkable. No biliary ductal ?dilation. ?  ?Pancreas: Unremarkable. No pancreatic ductal dilatation or ?surrounding inflammatory changes. ?  ?Spleen: Calcified splenic granulomata. ?  ?Adrenals/Urinary Tract: Adrenal glands are unremarkable. Kidneys are ?normal, without renal calculi, solid enhancing lesion, or ?hydronephrosis. Bladder is  unremarkable for degree of distension. ?  ?Stomach/Bowel: Radiopaque enteric contrast traverses the splenic ?flexure. Stomach is decompressed limiting evaluation. No pathologic ?dilation of small or large b

## 2021-09-12 LAB — BASIC METABOLIC PANEL
BUN/Creatinine Ratio: 19 (ref 12–28)
BUN: 14 mg/dL (ref 8–27)
CO2: 27 mmol/L (ref 20–29)
Calcium: 9.2 mg/dL (ref 8.7–10.3)
Chloride: 105 mmol/L (ref 96–106)
Creatinine, Ser: 0.75 mg/dL (ref 0.57–1.00)
Glucose: 105 mg/dL — ABNORMAL HIGH (ref 70–99)
Potassium: 4.7 mmol/L (ref 3.5–5.2)
Sodium: 140 mmol/L (ref 134–144)
eGFR: 88 mL/min/{1.73_m2} (ref >59)

## 2021-09-12 LAB — HEMOGLOBIN A1C
Est. average glucose Bld gHb Est-mCnc: 105 mg/dL
Hgb A1c MFr Bld: 5.3 % (ref 4.8–5.6)

## 2021-09-14 ENCOUNTER — Telehealth (HOSPITAL_COMMUNITY): Payer: Self-pay | Admitting: Emergency Medicine

## 2021-09-14 NOTE — Telephone Encounter (Signed)
Reaching out to patient to offer assistance regarding upcoming cardiac imaging study; pt verbalizes understanding of appt date/time, parking situation and where to check in, pre-test NPO status and medications ordered, and verified current allergies; name and call back number provided for further questions should they arise Kayla Bond RN Navigator Cardiac Imaging [facility] [phone] office [phone] cell  Denies iv issues Arribval 930 '50mg'$  metoprolol tartrate

## 2021-09-17 ENCOUNTER — Encounter (HOSPITAL_COMMUNITY): Payer: Self-pay

## 2021-09-17 ENCOUNTER — Ambulatory Visit (HOSPITAL_COMMUNITY)
Admission: RE | Admit: 2021-09-17 | Discharge: 2021-09-17 | Disposition: A | Discharge status: Home / Self Care | Payer: Medicare Other | Source: Ambulatory Visit | Attending: Internal Medicine | Admitting: Internal Medicine

## 2021-09-17 DIAGNOSIS — R931 Abnormal findings on diagnostic imaging of heart and coronary circulation: Secondary | ICD-10-CM | POA: Insufficient documentation

## 2021-09-17 DIAGNOSIS — R9431 Abnormal electrocardiogram [ECG] [EKG]: Secondary | ICD-10-CM | POA: Insufficient documentation

## 2021-09-17 DIAGNOSIS — R072 Precordial pain: Secondary | ICD-10-CM | POA: Insufficient documentation

## 2021-09-17 DIAGNOSIS — I2510 Atherosclerotic heart disease of native coronary artery without angina pectoris: Secondary | ICD-10-CM

## 2021-09-17 MED ORDER — IOHEXOL 350 MG/ML SOLN
100.0000 mL | Freq: Once | INTRAVENOUS | Status: AC | PRN
Start: 2021-09-17 — End: 2021-09-17
  Administered 2021-09-17: 100 mL via INTRAVENOUS

## 2021-09-17 MED ORDER — NITROGLYCERIN 0.4 MG SL SUBL
0.8000 mg | SUBLINGUAL_TABLET | Freq: Once | SUBLINGUAL | Status: AC
  Administered 2021-09-17: 0.8 mg via SUBLINGUAL

## 2021-09-18 ENCOUNTER — Ambulatory Visit (HOSPITAL_BASED_OUTPATIENT_CLINIC_OR_DEPARTMENT_OTHER)
Admission: RE | Admit: 2021-09-18 | Discharge: 2021-09-18 | Disposition: A | Discharge status: Home / Self Care | Payer: Medicare Other | Source: Ambulatory Visit | Attending: Internal Medicine | Admitting: Internal Medicine

## 2021-09-18 ENCOUNTER — Ambulatory Visit (HOSPITAL_COMMUNITY)
Admission: RE | Admit: 2021-09-18 | Discharge: 2021-09-18 | Disposition: A | Discharge status: Home / Self Care | Payer: Medicare Other | Source: Ambulatory Visit | Attending: Internal Medicine | Admitting: Internal Medicine

## 2021-09-18 ENCOUNTER — Other Ambulatory Visit: Payer: Self-pay | Admitting: Internal Medicine

## 2021-09-18 DIAGNOSIS — R931 Abnormal findings on diagnostic imaging of heart and coronary circulation: Secondary | ICD-10-CM

## 2021-09-20 ENCOUNTER — Telehealth: Payer: Self-pay

## 2021-09-20 DIAGNOSIS — Q2112 Patent foramen ovale: Secondary | ICD-10-CM

## 2021-09-20 NOTE — Telephone Encounter (Signed)
Kayla Munroe, MD  Chriss Driver, RN [facility]ie[facility] Lo[facility], please order a limited echo with bubble study. For PFO.  [place]         Order placed for Echo Limited with Bubble.

## 2021-09-20 NOTE — Telephone Encounter (Signed)
Called patient and made her aware that Echo has been ordered and that I would forward message to scheduling to get her scheduled. Patient verbalized understanding.

## 2021-10-10 ENCOUNTER — Ambulatory Visit (HOSPITAL_COMMUNITY): Payer: Medicare Other | Attending: Cardiology

## 2021-10-10 DIAGNOSIS — Q2112 Patent foramen ovale: Secondary | ICD-10-CM | POA: Insufficient documentation

## 2021-10-10 LAB — ECHOCARDIOGRAM LIMITED BUBBLE STUDY: S' Lateral: 2.9 cm

## 2021-10-17 ENCOUNTER — Encounter: Payer: Self-pay | Admitting: Internal Medicine

## 2021-11-21 ENCOUNTER — Encounter (INDEPENDENT_AMBULATORY_CARE_PROVIDER_SITE_OTHER): Payer: Self-pay

## 2021-11-22 ENCOUNTER — Ambulatory Visit: Payer: Medicare Other | Admitting: Internal Medicine

## 2021-12-10 ENCOUNTER — Ambulatory Visit: Payer: Medicare Other | Admitting: Internal Medicine

## 2021-12-25 ENCOUNTER — Encounter: Payer: Self-pay | Admitting: Internal Medicine

## 2021-12-25 ENCOUNTER — Encounter: Payer: Self-pay | Admitting: Family Medicine

## 2021-12-31 ENCOUNTER — Encounter (INDEPENDENT_AMBULATORY_CARE_PROVIDER_SITE_OTHER): Payer: Self-pay | Admitting: Internal Medicine

## 2021-12-31 ENCOUNTER — Ambulatory Visit (INDEPENDENT_AMBULATORY_CARE_PROVIDER_SITE_OTHER): Payer: Medicare Other | Admitting: Internal Medicine

## 2021-12-31 VITALS — Ht 64.0 in | Wt 184.0 lb

## 2021-12-31 DIAGNOSIS — E669 Obesity, unspecified: Secondary | ICD-10-CM

## 2021-12-31 DIAGNOSIS — K219 Gastro-esophageal reflux disease without esophagitis: Secondary | ICD-10-CM

## 2021-12-31 DIAGNOSIS — Z8673 Personal history of transient ischemic attack (TIA), and cerebral infarction without residual deficits: Secondary | ICD-10-CM

## 2021-12-31 DIAGNOSIS — E785 Hyperlipidemia, unspecified: Secondary | ICD-10-CM

## 2021-12-31 DIAGNOSIS — R5383 Other fatigue: Secondary | ICD-10-CM

## 2021-12-31 DIAGNOSIS — Z6831 Body mass index (BMI) 31.0-31.9, adult: Secondary | ICD-10-CM

## 2021-12-31 DIAGNOSIS — Z0289 Encounter for other administrative examinations: Secondary | ICD-10-CM

## 2021-12-31 NOTE — Progress Notes (Unsigned)
  Office: [phone]  /  Fax: [phone]  New Patient Consultation  Kayla Rosales was seen in clinic today to evaluate for obesity. We did a consultation to discuss her options for treatment and educate the patient on her disease state. She is interested in losing weight to improve overall health and reduce risk of complications. Had a stroke in past felt possibly due to PFO. On statin and APT. Followed by cardiology   Kayla Rosales's evaluation and workup began today. She was weighed on the bioimpedance scale and results were discussed and documented in the synopsis. Height '5\' 4"'$  (1.626 m), weight 184 lb (83.5 kg). Body mass index is 31.58 kg/m.   Obesity education preformed today:  We discussed obesity as a disease and the importance of a more detailed evaluation of all the factors contributing to the disease.  We discussed the importance of long term lifestyle changes which include nutrition, exercise and behavioral modifications as well as the importance of customizing this to her specific health and social needs.  We discussed the benefits of reaching a healthier weight to alleviate the symptoms or reduce the risks of biomechanical, metabolic and psychological effects of obesity.  Current health conditions we discussed, and we expect to improve include:   Class 1 obesity with serious comorbidity and body mass index (BMI) of 31.0 to 31.9 in adult, unspecified obesity type  Gastroesophageal reflux disease, unspecified whether esophagitis present  History of stroke  Hyperlipidemia LDL goal <100.  We discussed the goals of this program is to improve her overall health and not simply achieve a specific BMI.  Frequent visits are very important to patient success. I plan to see her every 2 weeks for the first 3 months and then evaluate the visit frequency after that time. I explained obesity is a life-long chronic disease and long term treatments would be required. Medications to  help her follow his eating plan may be offered as appropriate but are not required. All medication decisions will be made together after the initial workup is done and benefits and side effects are discussed in depth.  The clinic rules were reviewed including the late policy, cancellation policy, no show and program fees.  LEVETA WAHAB appears to be in the action stage of change and agrees they are ready to start intensive lifestyle modifications and behavioral modifications. We will schedule a longer visit to continue the evaluation including fasting labs, indirect calorimetry, ECG and a full nutritional and lifestyle evaluation.  25 minutes was spent today on this visit including the above counseling, pre-visit chart review, and post-visit documentation.

## 2022-01-07 ENCOUNTER — Ambulatory Visit (INDEPENDENT_AMBULATORY_CARE_PROVIDER_SITE_OTHER): Payer: Medicare Other | Admitting: Family Medicine

## 2022-01-07 ENCOUNTER — Encounter: Payer: Self-pay | Admitting: Family Medicine

## 2022-01-07 VITALS — BP 111/71 | HR 78 | Temp 98.3°F | Ht 64.0 in | Wt 185.0 lb

## 2022-01-07 DIAGNOSIS — R062 Wheezing: Secondary | ICD-10-CM | POA: Diagnosis not present

## 2022-01-07 DIAGNOSIS — R052 Subacute cough: Secondary | ICD-10-CM

## 2022-01-07 DIAGNOSIS — J4521 Mild intermittent asthma with (acute) exacerbation: Secondary | ICD-10-CM

## 2022-01-07 LAB — POCT INFLUENZA A/B
Influenza A, POC: NEGATIVE
Influenza B, POC: NEGATIVE

## 2022-01-07 LAB — POC COVID19 BINAXNOW: SARS Coronavirus 2 Ag: NEGATIVE

## 2022-01-07 MED ORDER — METHYLPREDNISOLONE ACETATE 80 MG/ML IJ SUSP
80.0000 mg | Freq: Once | INTRAMUSCULAR | Status: AC
  Administered 2022-01-07: 80 mg via INTRAMUSCULAR

## 2022-01-07 MED ORDER — ALBUTEROL SULFATE HFA 108 (90 BASE) MCG/ACT IN AERS: INHALATION_SPRAY | RESPIRATORY_TRACT | 0 refills | Status: DC

## 2022-01-07 MED ORDER — ALBUTEROL SULFATE (2.5 MG/3ML) 0.083% IN NEBU
2.5000 mg | INHALATION_SOLUTION | Freq: Once | RESPIRATORY_TRACT | Status: AC
  Administered 2022-01-07: 2.5 mg via RESPIRATORY_TRACT

## 2022-01-07 MED ORDER — DOXYCYCLINE HYCLATE 100 MG PO TABS: 100.0000 mg | ORAL_TABLET | Freq: Two times a day (BID) | ORAL | 0 refills | Status: DC

## 2022-01-07 NOTE — Progress Notes (Signed)
Kayla Rosales , 25-Jun-1955, 66 y.o., female MRN: [id number] Patient Care Team    Relationship Specialty Notifications Start End  Ma Hillock, DO PCP - General Family Medicine  01/15/18   Elouise Munroe, MD PCP - Cardiology Cardiology Admissions 07/09/19   Gatha Mayer, MD Consulting Physician Gastroenterology  01/19/18   Princess Bruins, MD Consulting Physician Obstetrics and Gynecology  04/03/20     Chief Complaint  Patient presents with   Cough    Pt c/o cough, post nasal drip, chest congestion, wheezing x 6 days     Subjective: Pt presents for an OV with complaints of cough, postnasal drip, chest congestion and wheezing of about 6 days in nature.  Has a history of bronchitis, and usually has 2-3 infections per year.  She states unfortunately she feels her symptoms are getting worse instead of better.  She is not sleeping well.  She has grandchildren that are also not feeling well.  She restarted her Flovent at the onset of illness.  She has an albuterol inhaler but she does not think it sedates any longer.     03/19/2021    9:31 AM 10/24/2020    8:53 AM 04/03/2020   10:40 AM 08/17/2019    8:29 AM 05/20/2019    3:01 PM  Depression screen PHQ 2/9  Decreased Interest 0 0 0 1 0  Down, Depressed, Hopeless 0 0 0 1 0  PHQ - 2 Score 0 0 0 2 0  Altered sleeping 1 0 3 3   Tired, decreased energy 1 0 3 3   Change in appetite 0 0 0 1   Feeling bad or failure about yourself  0 0 0 1   Trouble concentrating 0 0 0 1   Moving slowly or fidgety/restless 0 0 0 2   Suicidal thoughts 0 0 0 0   PHQ-9 Score 2 0 6 13   Difficult doing work/chores  Not difficult at all  Somewhat difficult     Allergies  Allergen Reactions   Hydromorphone Other (See Comments)    HYPOTENSION   Meperidine Other (See Comments)    "BP drops"   Sulfa Antibiotics Other (See Comments)    States she does not know the reaction    Social History   Social History Narrative   Married second family - 8  kids 26 grandkids blended   Haematologist, retired Pharmacist, hospital   Caffeine 2/day   Smoker - never   EtOH - rare   No drugs   Wears her seatbelt, smoke alarm at home.   Feels safe in her relationships.   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 Still unclear what is cuasing her pain. May be nerve related. May benefit  from starting on gabapentin. Start gabapentin 300 mg TID. F/u 4 weeks   Night sweats    Palpitations    Recurrent upper respiratory infection (URI)    Right arm weakness 09/28/2015   Last Assessment & Plan:  Formatting of this note might be different from the original. Referral to PT today. Suspect residual after stroke in left basal ganglia   Sleep apnea    Snoring 01/04/2019   Stroke (cerebrum) ([place]) 2017   right UE/neck sx, resolved.    Vertigo 08/11/2015   Last Assessment & Plan:  Formatting of this note might be different from the original. Concern given gradual onset over the last week and a half in addition to neck pain. Patient needs neuroimaging to rule out dissecting vertebral aneurysm. Patient advised to go to emergency room. She declined ambulance transport   Vitamin B 12 deficiency    Vitamin D deficiency    Past Surgical History:  Procedure Laterality Date   [facility] [place]   COLONOSCOPY W/ POLYPECTOMY  05/26/2018   x5  polyps   KNEE ARTHROSCOPY Bilateral 2013   meniscus  x2   NECK SURGERY  2018   disc fusion   UPPER GASTROINTESTINAL ENDOSCOPY     Family History  Problem Relation Age of Onset   Depression Mother    Heart disease Mother    Early death Mother 65   Hypertension Mother    Thyroid disease Mother    Anxiety disorder Mother    Obesity Mother    Arthritis Father    Heart disease Father    Depression Sister 37   Early death Sister    Drug abuse Sister    Lung cancer Sister    Mental illness Sister    COPD Sister    Diabetes Maternal Grandmother    Early death Maternal Grandmother    Early death Maternal Grandfather    Heart disease Maternal Grandfather    Breast cancer Cousin    Colon cancer Neg Hx    Esophageal cancer Neg Hx    Stomach cancer Neg Hx    Pancreatic cancer Neg Hx    Rectal cancer Neg Hx    Allergies as of 01/07/2022       Reactions   Hydromorphone Other (See Comments)   HYPOTENSION   Meperidine Other (See Comments)   "BP drops"   Sulfa Antibiotics Other (See Comments)   States she does not know the reaction        Medication List        Accurate as of January 07, 2022  1:34 PM. If you have any questions, ask your nurse or doctor.          albuterol 108 (90 Base) MCG/ACT inhaler Commonly known as: VENTOLIN HFA TAKE 2 PUFFS BY MOUTH EVERY 6 HOURS AS NEEDED FOR WHEEZE OR SHORTNESS OF BREATH   ALIVE WOMENS 50+ PO Take by mouth daily.   atorvastatin 20 MG tablet Commonly known as: LIPITOR Take 1 tablet (20 mg total) by mouth daily.   Benefiber Powd Take 5 mg by mouth daily.   Cholecalciferol 25 MCG (1000 UT) capsule Take 1,000 Units by mouth daily.   cyanocobalamin 1000 MCG tablet Take 1,000 mcg by mouth daily.   doxycycline 100 MG tablet Commonly known as: VIBRA-TABS Take 1 tablet (100 mg total) by mouth 2 (two) times daily. Started by: Howard Pouch, DO   esomeprazole 40 MG capsule Commonly  known as: NEXIUM Take 1 capsule (40 mg  total) by mouth daily at 12 noon.   fluticasone 50 MCG/ACT nasal spray Commonly known as: FLONASE Place into both nostrils daily.   Magnesium 250 MG Tabs Take 1 tablet by mouth daily.   polyethylene glycol powder 17 GM/SCOOP powder Commonly known as: GLYCOLAX/MIRALAX Take by mouth.   Probiotic Advanced Caps daily.        All past medical history, surgical history, allergies, family history, immunizations andmedications were updated in the EMR today and reviewed under the history and medication portions of their EMR.     ROS Negative, with the exception of above mentioned in HPI   Objective:  BP 111/71   Pulse 78   Temp 98.3 F (36.8 C) (Oral)   Ht '5\' 4"'$  (1.626 m)   Wt 185 lb (83.9 kg)   SpO2 96%   BMI 31.76 kg/m  Body mass index is 31.76 kg/m. Physical Exam Vitals and nursing note reviewed.  Constitutional:      General: She is not in acute distress.    Appearance: Normal appearance. She is normal weight. She is not ill-appearing or toxic-appearing.  HENT:     Head: Normocephalic and atraumatic.     Right Ear: Tympanic membrane and ear canal normal.     Left Ear: Tympanic membrane and ear canal normal.     Nose: Congestion and rhinorrhea present.     Mouth/Throat:     Pharynx: No oropharyngeal exudate or posterior oropharyngeal erythema.  Eyes:     General:        Right eye: No discharge.        Left eye: No discharge.     Extraocular Movements: Extraocular movements intact.     Conjunctiva/sclera: Conjunctivae normal.     Pupils: Pupils are equal, round, and reactive to light.  Cardiovascular:     Rate and Rhythm: Regular rhythm.  Pulmonary:     Effort: Pulmonary effort is normal. No respiratory distress.     Breath sounds: Wheezing and rhonchi present. No rales.     Comments: Cough and decreased BS. Musculoskeletal:     Cervical back: Neck supple.  Skin:    Findings: No rash.  Neurological:     Mental Status: She is alert and oriented to person,  place, and time. Mental status is at baseline.  Psychiatric:        Mood and Affect: Mood normal.        Behavior: Behavior normal.        Thought Content: Thought content normal.        Judgment: Judgment normal.      No results found. No results found. Results for orders placed or performed in visit on 01/07/22 (from the past 24 hour(s))  POCT Influenza A/B     Status: None   Collection Time: 01/07/22 10:47 AM  Result Value Ref Range   Influenza A, POC Negative Negative   Influenza B, POC Negative Negative  POC COVID-19 BinaxNow     Status: Normal   Collection Time: 01/07/22  1:07 PM  Result Value Ref Range   SARS Coronavirus 2 Ag Negative Negative    Assessment/Plan: CATHERINE CUBERO is a 66 y.o. female present for OV for  Subacute cough/wheezing - POCT Influenza A/B> negative - POC COVID-19 BinaxNow> negative  Mild intermittent asthmatic bronchitis with acute exacerbation Rest, hydrate.  +/- flonase, mucinex (DM if cough), nettie pot or nasal saline.  Doxycycline twice daily prescribed, take  until completed.  IM Depo-Medrol 80 (declined prednisone) Continue Flovent, continue albuterol If cough present it can last up to 6-8 weeks.  F/U 2 weeks if not improved.    Reviewed expectations re: course of current medical issues. Discussed self-management of symptoms. Outlined signs and symptoms indicating need for more acute intervention. Patient verbalized understanding and all questions were answered. Patient received an After-Visit Summary.    Orders Placed This Encounter  Procedures   POCT Influenza A/B   POC COVID-19 BinaxNow   Meds ordered this encounter  Medications   doxycycline (VIBRA-TABS) 100 MG tablet    Sig: Take 1 tablet (100 mg total) by mouth 2 (two) times daily.    Dispense:  20 tablet    Refill:  0   albuterol (VENTOLIN HFA) 108 (90 Base) MCG/ACT inhaler    Sig: TAKE 2 PUFFS BY MOUTH EVERY 6 HOURS AS NEEDED FOR WHEEZE OR SHORTNESS OF BREATH     Dispense:  8.5 each    Refill:  0   methylPREDNISolone acetate (DEPO-MEDROL) injection 80 mg   albuterol (PROVENTIL) (2.5 MG/3ML) 0.083% nebulizer solution 2.5 mg   Referral Orders  No referral(s) requested today     Note is dictated utilizing voice recognition software. Although note has been proof read prior to signing, occasional typographical errors still can be missed. If any questions arise, please do not hesitate to call for verification.   electronically signed by:  Howard Pouch, DO  [place]

## 2022-01-07 NOTE — Patient Instructions (Signed)

## 2022-01-08 ENCOUNTER — Telehealth: Payer: Self-pay

## 2022-01-08 MED ORDER — BENZONATATE 200 MG PO CAPS: 200.0000 mg | ORAL_CAPSULE | Freq: Two times a day (BID) | ORAL | 0 refills | Status: DC | PRN

## 2022-01-08 NOTE — Telephone Encounter (Signed)
Filled the Tessalon Perles requested for her

## 2022-01-08 NOTE — Telephone Encounter (Signed)
Patient was seen yesterday.  Patient states her cough is a lot worse.  She is coughing so much it is giving her a headache. Patient states Dr. Raoul Pitch prescribed Tessalon capsules last year, however, they have expired.  Can Dr. Raoul Pitch call in a current prescription for Tessalon?     benzonatate (TESSALON) 200 MG capsule

## 2022-01-08 NOTE — Telephone Encounter (Signed)
LM for pt to return call to discuss.  

## 2022-01-09 NOTE — Telephone Encounter (Signed)
LVM for pt to CB regarding meds.

## 2022-01-14 ENCOUNTER — Encounter: Payer: Self-pay | Admitting: Physician Assistant

## 2022-01-14 ENCOUNTER — Ambulatory Visit (INDEPENDENT_AMBULATORY_CARE_PROVIDER_SITE_OTHER): Payer: Medicare Other

## 2022-01-14 ENCOUNTER — Ambulatory Visit (INDEPENDENT_AMBULATORY_CARE_PROVIDER_SITE_OTHER): Payer: Medicare Other | Admitting: Physician Assistant

## 2022-01-14 DIAGNOSIS — M25562 Pain in left knee: Secondary | ICD-10-CM

## 2022-01-14 DIAGNOSIS — S83242D Other tear of medial meniscus, current injury, left knee, subsequent encounter: Secondary | ICD-10-CM | POA: Diagnosis not present

## 2022-01-14 NOTE — Progress Notes (Signed)
Office Visit Note   Patient: Kayla Rosales           Date of Birth: 10-Sep-1955           MRN: [id number] Visit Date: 01/14/2022              Requested by: Ma Hillock, DO [address] [place],  [place] [address] PCP: Ma Hillock, DO   Assessment & Plan: Visit Diagnoses:  1. Acute pain of left knee   2. Other tear of medial meniscus, current injury, left knee, subsequent encounter     Plan: Kayla Rosales is a pleasant 66 year old woman with a 4 to 6-day history of acute posterior medial left knee pain.  She did hear a loud pop and and now has difficulty weightbearing on the left knee.  She thinks this may have been from playing with her grandkids and running.  She has been icing it to help with the swelling.  She has a history of a meniscus repair on her right knee and a meniscectomy on her left knee done by Dr. Benjiman Core about 10 years ago at the [facility].  She thinks she may have had an injection in this left knee as well more recently.  Although she has had some stiffness in her knees upon arising this acute pain is making it that she has difficulty weightbearing and walking on the left knee.  She cannot squat and has painful popping and catching she does have arthritis by x-ray with periarticular osteophytes and medial joint space narrowing but her systems appear more acute may be consistent with a large meniscus tear.  She would like to get an MRI as soon as possible.  I did discuss with her that it is often difficult to predict success of arthroscopy if she does have a meniscus tear and arthritis.  That being said she did not have significant pain prior to this injury.  We did discuss aspiration and steroid injection though her swelling has gone down and she would be best off not doing a steroid until after the MRI is complete  Follow-Up Instructions: No follow-ups on file.   Orders:  Orders Placed This Encounter  Procedures   XR KNEE 3 VIEW LEFT   MR Knee Left w/o contrast    No orders of the defined types were placed in this encounter.     Procedures: No procedures performed   Clinical Data: No additional findings.   Subjective: Chief Complaint  Patient presents with   Left Knee - Pain    HPI patient is a pleasant 66 year old woman with a 1 week history of acute posterior medial knee pain after running with her grandkids and she heard a loud pop.  Has had difficulty ambulating since.  Has had a history of a knee arthroscopy on this left knee about 10 years ago at the [facility] in [place]  All other systems reviewed and are negative.    Objective: Vital Signs: There were no vitals taken for this visit.  Physical Exam Constitutional:      Appearance: Normal appearance.  Pulmonary:     Effort: Pulmonary effort is normal.  Skin:    General: Skin is warm and dry.  Neurological:     General: No focal deficit present.     Mental Status: She is alert.     Ortho Exam Left knee.  She has a mild to moderate effusion no redness or cellulitis.  She has no crepitation with range of motion.  No tenderness over the lateral joint line no tenderness over the anterior medial joint line she does have significant tenderness over the posterior medial joint line.  Pain is accentuated with terminal extension and any attempted flexion.  Good varus valgus and anterior stability Specialty Comments:  No specialty comments available.  Imaging: XR KNEE 3 VIEW LEFT  Result Date: 01/14/2022 Three-view radiographs of her left knee were obtained today.  There is no acute fractures or other osseous abnormalities.  She does have joint space narrowing especially in the medial compartment with small periarticular osteophytes.  Some early degeneration of the patellofemoral joint as well.    PMFS History: Patient Active Problem List   Diagnosis Date Noted   Other tear of medial meniscus, current injury, left knee, subsequent encounter 01/14/2022    Chronic rhinitis 03/28/2021   Other adverse food reactions, not elsewhere classified, subsequent encounter 46/50/3546   Diastolic dysfunction 56/81/2751   Heart murmur 02/16/2019   Hyperlipidemia LDL goal <100 02/16/2019   Class 1 obesity with serious comorbidity and body mass index (BMI) of 30.0 to 30.9 in adult 01/04/2019   Pelvic floor dysfunction in female 12/06/2018   Constipation due to outlet dysfunction 12/06/2018   Lactose intolerance 12/06/2018   Diverticulosis 05/27/2018   Osteopenia 01/19/2018   Irritable bowel syndrome with constipation 01/17/2018   History of colonic polyp 01/17/2018   History of stroke 01/15/2018   GERD (gastroesophageal reflux disease)    Asthma    S/P cervical spinal fusion 09/29/2017   Macrocytosis without anemia 11/25/2015   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   Heart palpitations 09/08/2015   Vitamin B12 deficiency 04/06/2014   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   Vestibular neuritis 10/15/2006   Family history of malignant neoplasm of breast 03/18/2006   Diaphragmatic hernia 03/18/2006   Premature menopause 03/18/2006   Past Medical History:  Diagnosis Date   Allergy    Arthritis    Asthma    Back pain    Benign positional vertigo    Chicken pox    Colon polyps    Cough 10/09/2020   COVID-19 10/09/2020   Diverticulitis    Early menopause occurring in patient age younger than 38 years    Fibromyalgia    GERD (gastroesophageal reflux disease)    Gluten intolerance    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 Still unclear what is cuasing her pain. May be nerve related. May benefit from starting on gabapentin. Start gabapentin 300 mg TID. F/u 4 weeks   Night sweats    Palpitations    Recurrent  upper respiratory infection (URI)    Right arm weakness 09/28/2015   Last Assessment & Plan:  Formatting of this note might be different from the original. Referral to PT today. Suspect residual after stroke in left basal ganglia   Sleep apnea    Snoring 01/04/2019   Stroke (cerebrum) ([facility]) 2017   right UE/neck sx, resolved.    Vertigo 08/11/2015   Last Assessment & Plan:  Formatting of this note might be different from the original. Concern given gradual onset over the last week and a half in addition to neck pain. Patient needs neuroimaging to rule out  dissecting vertebral aneurysm. Patient advised to go to emergency room. She declined ambulance transport   Vitamin B 12 deficiency    Vitamin D deficiency     Family History  Problem Relation Age of Onset   Depression Mother    Heart disease Mother    Early death Mother 83   Hypertension Mother    Thyroid disease Mother    Anxiety disorder Mother    Obesity Mother    Arthritis Father    Heart disease Father    Depression Sister 31   Early death Sister    Drug abuse Sister    Lung cancer Sister    Mental illness Sister    COPD Sister    Diabetes Maternal Grandmother    Early death Maternal Grandmother    Early death Maternal Grandfather    Heart disease Maternal Grandfather    Breast cancer Cousin    Colon cancer Neg Hx    Esophageal cancer Neg Hx    Stomach cancer Neg Hx    Pancreatic cancer Neg Hx    Rectal cancer Neg Hx     Past Surgical History:  Procedure Laterality Date   COLONOSCOPY  2015   [name]- [place], [place]; 5 yr repeat recommended   COLONOSCOPY W/ POLYPECTOMY  05/26/2018   x5 polyps   KNEE ARTHROSCOPY Bilateral 2013   meniscus  x2   NECK SURGERY  2018   disc fusion   UPPER GASTROINTESTINAL ENDOSCOPY     Social History   Occupational History   Occupation: retired Pharmacist, hospital  Tobacco Use   Smoking status: Never    Passive exposure: Never   Smokeless tobacco: Never  Vaping Use   Vaping Use:  Never used  Substance and Sexual Activity   Alcohol use: Yes    Alcohol/week: 1.0 standard drink of alcohol    Types: 1 Glasses of wine per week   Drug use: Never   Sexual activity: Yes    Partners: Male    Birth control/protection: Post-menopausal    Comment: married,intercourse age 35, less than 5 sexual partners,des neg

## 2022-01-21 ENCOUNTER — Ambulatory Visit (HOSPITAL_BASED_OUTPATIENT_CLINIC_OR_DEPARTMENT_OTHER): Payer: Medicare Other | Admitting: Orthopaedic Surgery

## 2022-01-21 ENCOUNTER — Other Ambulatory Visit (INDEPENDENT_AMBULATORY_CARE_PROVIDER_SITE_OTHER): Payer: Self-pay | Admitting: Internal Medicine

## 2022-01-21 ENCOUNTER — Encounter (INDEPENDENT_AMBULATORY_CARE_PROVIDER_SITE_OTHER): Payer: Self-pay | Admitting: Internal Medicine

## 2022-01-21 ENCOUNTER — Ambulatory Visit (INDEPENDENT_AMBULATORY_CARE_PROVIDER_SITE_OTHER): Payer: Medicare Other | Admitting: Internal Medicine

## 2022-01-21 VITALS — BP 120/76 | HR 64 | Temp 97.9°F | Ht 64.0 in | Wt 181.2 lb

## 2022-01-21 DIAGNOSIS — E559 Vitamin D deficiency, unspecified: Secondary | ICD-10-CM | POA: Diagnosis not present

## 2022-01-21 DIAGNOSIS — E7849 Other hyperlipidemia: Secondary | ICD-10-CM | POA: Insufficient documentation

## 2022-01-21 DIAGNOSIS — E669 Obesity, unspecified: Secondary | ICD-10-CM | POA: Diagnosis not present

## 2022-01-21 DIAGNOSIS — R0602 Shortness of breath: Secondary | ICD-10-CM

## 2022-01-21 DIAGNOSIS — Z8673 Personal history of transient ischemic attack (TIA), and cerebral infarction without residual deficits: Secondary | ICD-10-CM

## 2022-01-21 DIAGNOSIS — R7309 Other abnormal glucose: Secondary | ICD-10-CM | POA: Diagnosis not present

## 2022-01-21 DIAGNOSIS — R5383 Other fatigue: Secondary | ICD-10-CM | POA: Diagnosis not present

## 2022-01-21 DIAGNOSIS — Z1331 Encounter for screening for depression: Secondary | ICD-10-CM | POA: Diagnosis not present

## 2022-01-21 DIAGNOSIS — Z6831 Body mass index (BMI) 31.0-31.9, adult: Secondary | ICD-10-CM

## 2022-01-22 LAB — INSULIN, RANDOM: INSULIN: 12.9 u[IU]/mL (ref 2.6–24.9)

## 2022-01-22 LAB — TSH: TSH: 1.29 u[IU]/mL (ref 0.450–4.500)

## 2022-01-22 LAB — LIPID PANEL WITH LDL/HDL RATIO
Cholesterol, Total: 132 mg/dL (ref 100–199)
HDL: 45 mg/dL (ref >39)
LDL Chol Calc (NIH): 65 mg/dL (ref 0–99)
LDL/HDL Ratio: 1.4 ratio (ref 0.0–3.2)
Triglycerides: 121 mg/dL (ref 0–149)
VLDL Cholesterol Cal: 22 mg/dL (ref 5–40)

## 2022-01-24 ENCOUNTER — Ambulatory Visit
Admission: RE | Admit: 2022-01-24 | Discharge: 2022-01-24 | Disposition: A | Discharge status: Home / Self Care | Payer: Medicare Other | Source: Ambulatory Visit | Attending: Physician Assistant | Admitting: Physician Assistant

## 2022-01-24 DIAGNOSIS — M25562 Pain in left knee: Secondary | ICD-10-CM

## 2022-01-29 ENCOUNTER — Ambulatory Visit (INDEPENDENT_AMBULATORY_CARE_PROVIDER_SITE_OTHER): Payer: Medicare Other | Admitting: Orthopaedic Surgery

## 2022-01-29 ENCOUNTER — Encounter: Payer: Self-pay | Admitting: Orthopaedic Surgery

## 2022-01-29 ENCOUNTER — Telehealth: Payer: Self-pay

## 2022-01-29 DIAGNOSIS — M1712 Unilateral primary osteoarthritis, left knee: Secondary | ICD-10-CM

## 2022-01-29 NOTE — Progress Notes (Signed)
Office Visit Note   Patient: Kayla Rosales           Date of Birth: [birth year]           MRN: [id number] Visit Date: 01/29/2022              Requested by: Ma Hillock, DO [address] [place],  [place] [address] PCP: Ma Hillock, DO   Assessment & Plan: Visit Diagnoses:  1. Primary osteoarthritis of left knee     Plan: Scan shows advanced chondromalacia of the medial compartment and moderately severe chondromalacia of the patellofemoral compartment.  The lateral compartment shows a degenerative lateral meniscus tear in the posterior horn.  She has had a prior knee scope over 10 years ago that was done at the [facility] and it sounds like she had medial meniscectomy.  Findings are consistent with degenerative joint disease rather than acute meniscal injury.  Overall she feels that the symptoms are manageable for now.  She would like to get approval for Visco injections cortisone injections have not been helpful.  For those Visco injections once we get authorization.  Questions encouraged and answered.  This patient is diagnosed with osteoarthritis of the knee(s).    Radiographs show evidence of joint space narrowing, osteophytes, subchondral sclerosis and/or subchondral cysts.  This patient has knee pain which interferes with functional and activities of daily living.    This patient has experienced inadequate response, adverse effects and/or intolerance with conservative treatments such as acetaminophen, NSAIDS, topical creams, physical therapy or regular exercise, knee bracing and/or weight loss.   This patient has experienced inadequate response or has a contraindication to intra articular steroid injections for at least 3 months.   This patient is not scheduled to have a total knee replacement within 6 months of starting treatment with viscosupplementation.  Follow-Up Instructions: No follow-ups on file.   Orders:  Orders Placed This Encounter  Procedures    Ambulatory referral to Physical Therapy   No orders of the defined types were placed in this encounter.     Procedures: No procedures performed   Clinical Data: No additional findings.   Subjective: Chief Complaint  Patient presents with   Left Knee - Follow-up    MRI review    HPI [name] returns today to review left knee MRI scan.  She was previously seen by [facility] who ordered an MRI after Daphene did not improve from conservative treatment.  Review of Systems  Constitutional: Negative.   HENT: Negative.    Eyes: Negative.   Respiratory: Negative.    Cardiovascular: Negative.   Endocrine: Negative.   Musculoskeletal: Negative.   Neurological: Negative.   Hematological: Negative.   Psychiatric/Behavioral: Negative.    All other systems reviewed and are negative.    Objective: Vital Signs: There were no vitals taken for this visit.  Physical Exam Vitals and nursing note reviewed.  Constitutional:      Appearance: She is well-developed.  Pulmonary:     Effort: Pulmonary effort is normal.  Skin:    General: Skin is warm.     Capillary Refill: Capillary refill takes less than 2 seconds.  Neurological:     Mental Status: She is alert and oriented to person, place, and time.  Psychiatric:        Behavior: Behavior normal.        Thought Content: Thought content normal.        Judgment: Judgment normal.  Ortho Exam Examination of the left knee shows medial joint line tenderness.  There is no lateral joint line tenderness.  Negative McMurray.  Patellofemoral crepitus with range of motion.  Range of motion is relatively preserved.  Collaterals and cruciates are stable.  Trace effusion. Specialty Comments:  No specialty comments available.  Imaging: No results found.   PMFS History: Patient Active Problem List   Diagnosis Date Noted   Other fatigue 01/21/2022   SOB (shortness of breath) 01/21/2022   Depression screening 01/21/2022   Class 1  obesity without serious comorbidity with body mass index (BMI) of 31.0 to 31.9 in adult 01/21/2022   Vitamin D deficiency 01/21/2022   Abnormal glucose 01/21/2022   Other hyperlipidemia 01/21/2022   Other tear of medial meniscus, current injury, left knee, subsequent encounter 01/14/2022   Chronic rhinitis 03/28/2021   Other adverse food reactions, not elsewhere classified, subsequent encounter 73/42/8768   Diastolic dysfunction 11/57/2620   Heart murmur 02/16/2019   Hyperlipidemia LDL goal <100 02/16/2019   Class 1 obesity with serious comorbidity and body mass index (BMI) of 30.0 to 30.9 in adult 01/04/2019   Pelvic floor dysfunction in female 12/06/2018   Constipation due to outlet dysfunction 12/06/2018   Lactose intolerance 12/06/2018   Diverticulosis 05/27/2018   Osteopenia 01/19/2018   Irritable bowel syndrome with constipation 01/17/2018   History of colonic polyp 01/17/2018   History of stroke 01/15/2018   GERD (gastroesophageal reflux disease)    Asthma    S/P cervical spinal fusion 09/29/2017   Macrocytosis without anemia 11/25/2015   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   Heart palpitations 09/08/2015   Vitamin B12 deficiency 04/06/2014   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   Vestibular neuritis 10/15/2006   Family history of malignant neoplasm of breast 03/18/2006   Diaphragmatic hernia 03/18/2006   Premature menopause 03/18/2006   Past Medical History:  Diagnosis Date   Allergy    Arthritis    Asthma    Back pain    Benign positional vertigo    Bilateral swelling of feet    Bronchitis    Chicken pox    Colon polyps    Constipation    Cough 10/09/2020   COVID-19 10/09/2020   Diverticulitis    Early menopause occurring in patient age younger than 30 years    Fibromyalgia    GERD (gastroesophageal reflux disease)    Gluten intolerance    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 Still unclear what is cuasing her pain. May be nerve related. May benefit from starting on gabapentin. Start gabapentin 300 mg TID. F/u 4 weeks   Night sweats    Palpitations    Recurrent upper respiratory infection (URI)    Right arm weakness 09/28/2015   Last Assessment & Plan:  Formatting of this note might be different from the original. Referral to PT today. Suspect residual after stroke in left basal ganglia   Sleep apnea    Snoring 01/04/2019   Stroke (cerebrum) ([place]) 2017   right UE/neck sx, resolved.    Vertigo 08/11/2015   Last Assessment & Plan:  Formatting of this note might be different from the original. Concern given gradual onset over the last week and a half in addition  to neck pain. Patient needs neuroimaging to rule out dissecting vertebral aneurysm. Patient advised to go to emergency room. She declined ambulance transport   Vitamin B 12 deficiency    Vitamin D deficiency     Family History  Problem Relation Age of Onset   Depression Mother    Heart disease Mother    Early death Mother 30   Hypertension Mother    Thyroid disease Mother    Anxiety disorder Mother    Obesity Mother    Arthritis Father    Heart disease Father    Hypertension Father    Stroke Father    Depression Sister 79   Early death Sister    Drug abuse Sister    Lung cancer Sister    Mental illness Sister    COPD Sister    Diabetes Maternal Grandmother    Early death Maternal Grandmother    Early death Maternal Grandfather    Heart disease Maternal Grandfather    Breast cancer Cousin    Colon cancer Neg Hx    Esophageal cancer Neg Hx    Stomach cancer Neg Hx    Pancreatic cancer Neg Hx    Rectal cancer Neg Hx     Past Surgical History:  Procedure Laterality Date   CARPAL TUNNEL RELEASE     COLONOSCOPY  2015   Dr.  [name]- [place], [place]; 5 yr repeat recommended   COLONOSCOPY W/ POLYPECTOMY  05/26/2018   x5 polyps   KNEE ARTHROSCOPY Bilateral 2013   meniscus  x2   NECK SURGERY  2018   disc fusion   UPPER GASTROINTESTINAL ENDOSCOPY     Social History   Occupational History   Occupation: retired Pharmacist, hospital  Tobacco Use   Smoking status: Never    Passive exposure: Never   Smokeless tobacco: Never  Vaping Use   Vaping Use: Never used  Substance and Sexual Activity   Alcohol use: Yes    Alcohol/week: 1.0 standard drink of alcohol    Types: 1 Glasses of wine per week   Drug use: Never   Sexual activity: Yes    Partners: Male    Birth control/protection: Post-menopausal    Comment: married,intercourse age 68, less than 5 sexual partners,des neg

## 2022-01-29 NOTE — Telephone Encounter (Signed)
Can we get patient approved for Bil Synvisc injections

## 2022-01-30 NOTE — Progress Notes (Signed)
Chief Complaint:   OBESITY Kayla Rosales (MR# [id number]) is a 66 y.o. female who presents for evaluation and treatment of obesity and related comorbidities. Current BMI is Body mass index is 31.1 kg/m. Kayla Rosales has been struggling with her weight for many years and has been unsuccessful in either losing weight, maintaining weight loss, or reaching her healthy weight goal.  Kayla Rosales is currently in the action stage of change and ready to dedicate time achieving and maintaining a healthier weight. Kayla Rosales is interested in becoming our patient and working on intensive lifestyle modifications including (but not limited to) diet and exercise for weight loss.  Kayla Rosales's habits were reviewed today and are as follows: Her family eats meals together, she thinks her family will eat healthier with her, her desired weight loss is 31-41 lbs, she started gaining weight in her 50's-60's, her heaviest weight ever was 190 pounds, she has significant food cravings issues, she snacks frequently in the evenings, she is frequently drinking liquids with calories, she frequently makes poor food choices, she has problems with excessive hunger, she frequently eats larger portions than normal, and she struggles with emotional eating.  Depression Screen Rosezella's Food and Mood (modified PHQ-9) score was 11.     01/21/2022    8:28 AM  Depression screen PHQ 2/9  Decreased Interest 3  Down, Depressed, Hopeless 1  PHQ - 2 Score 4  Altered sleeping 1  Tired, decreased energy 3  Change in appetite 2  Feeling bad or failure about yourself  0  Trouble concentrating 1  Moving slowly or fidgety/restless 0  Suicidal thoughts 0  PHQ-9 Score 11  Difficult doing work/chores Somewhat difficult   Subjective:   1. Other fatigue Kayla Rosales admits to daytime somnolence and admits to waking up still tired. Patient has a history of symptoms of daytime fatigue and morning fatigue. Kayla Rosales generally gets 6 hours of sleep per night, and  states that she has nightime awakenings. Snoring is present. Apneic episodes are present. Epworth Sleepiness Score is 13. EKG normal sinus rhythm without acute ischemic changes.  2. SOB (shortness of breath) Kayla Rosales Look notes increasing shortness of breath with exercising and seems to be worsening over time with weight gain. She notes getting out of breath sooner with activity than she used to. This has not gotten worse recently. Kayla Rosales denies shortness of breath at rest or orthopnea.  3. Other hyperlipidemia Kayla Rosales's previous LDL was 65 (in 2022). She is on HD statin, but no AE. I discussed labs with the patient today.   4. Abnormal glucose Kayla Rosales's previous glucose was 105, asymptomatic. I discussed labs with the patient today.   5. Vitamin D deficiency Kayla Rosales has Vitamin D deficiency per history. Her last Vita D level was 54.9 in 2021. She is not on supplementation now. Diagnosis associated with adiposity, leptin resistance, and adipogenesis.   6. History of stroke Without residual deficits. She has a history of PFD on APT and statin.   Assessment/Plan:   1. Other fatigue Kayla Rosales does feel that her weight is causing her energy to be lower than it should be. Fatigue may be related to obesity, depression or many other causes. Labs will be ordered, and in the meanwhile, Kayla Rosales will focus on self care including making healthy food choices, increasing physical activity and focusing on stress reduction.  - Hemoglobin A1c; Future - Insulin, random - TSH - Vitamin B12 - CBC with Differential/Platelet; Future - Comprehensive metabolic panel; Future  2. SOB (shortness of  breath) Kayla Rosales does feel that she gets out of breath more easily that she used to when she exercises. Kayla Rosales's shortness of breath appears to be obesity related and exercise induced. She has agreed to work on weight loss and gradually increase exercise to treat her exercise induced shortness of breath. Will continue to monitor  closely.  3. Other hyperlipidemia We will check labs today. Kayla Rosales will work on her weight loss therapy and continue statin.   - Lipid Panel With LDL/HDL Ratio  4. Abnormal glucose We will check labs today, and we will follow-up at Kayla Rosales next office visit.  - Hemoglobin A1c; Future  5. Vitamin D deficiency We will check labs today, and we will supplement if needed.   - VITAMIN D 25 Hydroxy (Vit-D Deficiency, Fractures); Future  6. History of stroke Kayla Rosales will work on her weight loss therapy, and continue APT and statin.   7. Depression screening Kayla Rosales had a positive depression screening. Depression is commonly associated with obesity and often results in emotional eating behaviors. We will monitor this closely and work on CBT to help improve the non-hunger eating patterns. Referral to Psychology may be required if no improvement is seen as she continues in our clinic.  8. Class 1 obesity without serious comorbidity with body mass index (BMI) of 31.0 to 31.9 in adult, unspecified obesity type Kayla Rosales is currently in the action stage of change and her goal is to continue with weight loss efforts. I recommend Maicee begin the structured treatment plan as follows:  She has agreed to the Category 2 Plan.  Exercise goals: All adults should avoid inactivity. Some physical activity is better than none, and adults who participate in any amount of physical activity gain some health benefits.   Behavioral modification strategies: increasing lean protein intake, decreasing simple carbohydrates, increasing vegetables, increasing water intake, no skipping meals, meal planning and cooking strategies, keeping healthy foods in the home, better snacking choices, avoiding temptations, and planning for success.  She was informed of the importance of frequent follow-up visits to maximize her success with intensive lifestyle modifications for her multiple health conditions. She was informed we would  discuss her lab results at her next visit unless there is a critical issue that needs to be addressed sooner. Kayla Rosales agreed to keep her next visit at the agreed upon time to discuss these results.  Objective:   Blood pressure 120/76, pulse 64, temperature 97.9 F (36.6 C), height '5\' 4"'$  (1.626 m), weight 181 lb 3.2 oz (82.2 kg), SpO2 97 %. Body mass index is 31.1 kg/m.  EKG: Normal sinus rhythm, rate 60 BPM.  Indirect Calorimeter completed today shows a VO2 of 231 and a REE of 1598.  Her calculated basal metabolic rate is 2706 thus her basal metabolic rate is better than expected.  General: Cooperative, alert, well developed, in no acute distress. HEENT: Conjunctivae and lids unremarkable. Cardiovascular: Regular rhythm.  Lungs: Normal work of breathing. Neurologic: No focal deficits.   Lab Results  Component Value Date   CREATININE 0.75 09/12/2021   BUN 14 09/12/2021   NA 140 09/12/2021   K 4.7 09/12/2021   CL 105 09/12/2021   CO2 27 09/12/2021   Lab Results  Component Value Date   ALT 21 12/27/2020   AST 19 12/27/2020   ALKPHOS 75 12/27/2020   BILITOT 0.9 12/27/2020   Lab Results  Component Value Date   HGBA1C 5.3 09/12/2021   HGBA1C 5.4 03/19/2021   HGBA1C 5.1 12/09/2019   HGBA1C  5.4 08/17/2019   HGBA1C 5.3 12/17/2018   Lab Results  Component Value Date   INSULIN 12.9 01/21/2022   INSULIN 12.7 12/09/2019   INSULIN 20.0 08/17/2019   Lab Results  Component Value Date   TSH 1.290 01/21/2022   Lab Results  Component Value Date   CHOL 132 01/21/2022   HDL 45 01/21/2022   LDLCALC 65 01/21/2022   TRIG 121 01/21/2022   CHOLHDL 3 03/19/2021   Lab Results  Component Value Date   WBC 5.0 12/27/2020   HGB 14.5 12/27/2020   HCT 41.7 12/27/2020   MCV 99.5 12/27/2020   PLT 225.0 12/27/2020   Lab Results  Component Value Date   IRON 73 06/15/2019   TIBC 263 06/15/2019   FERRITIN 166 (H) 06/15/2019   Attestation Statements:   Reviewed by clinician on day  of visit: allergies, medications, problem list, medical history, surgical history, family history, social history, and previous encounter notes.  Time spent on visit including pre-visit chart review and post-visit charting and care was 40 minutes.   Wilhemena Durie, am acting as transcriptionist for Thomes Dinning, MD.  I have reviewed the above documentation for accuracy and completeness, and I agree with the above. -Thomes Dinning, MD

## 2022-01-30 NOTE — Telephone Encounter (Signed)
VOB submitted for Monovisc, bilateral knee  

## 2022-02-01 ENCOUNTER — Other Ambulatory Visit: Payer: Self-pay

## 2022-02-01 ENCOUNTER — Ambulatory Visit: Payer: Medicare Other | Attending: Orthopaedic Surgery | Admitting: Rehabilitative and Restorative Service Providers"

## 2022-02-01 ENCOUNTER — Encounter: Payer: Self-pay | Admitting: Rehabilitative and Restorative Service Providers"

## 2022-02-01 DIAGNOSIS — R262 Difficulty in walking, not elsewhere classified: Secondary | ICD-10-CM | POA: Diagnosis not present

## 2022-02-01 DIAGNOSIS — M1712 Unilateral primary osteoarthritis, left knee: Secondary | ICD-10-CM | POA: Insufficient documentation

## 2022-02-01 DIAGNOSIS — M25562 Pain in left knee: Secondary | ICD-10-CM | POA: Insufficient documentation

## 2022-02-01 DIAGNOSIS — G8929 Other chronic pain: Secondary | ICD-10-CM | POA: Insufficient documentation

## 2022-02-01 DIAGNOSIS — M6281 Muscle weakness (generalized): Secondary | ICD-10-CM | POA: Diagnosis not present

## 2022-02-01 NOTE — Therapy (Signed)
OUTPATIENT PHYSICAL THERAPY LOWER EXTREMITY EVALUATION   Patient Name: Kayla Rosales MRN: [id number] DOB:07-18-1955, 66 y.o., female Today's Date: 02/01/2022   PT End of Session - 02/01/22 0856     Visit Number 1    Date for PT Re-Evaluation 03/29/22    Authorization Type Medicare/[employer]    Progress Note Due on Visit 10    PT Start Time 0845    PT Stop Time 0925    PT Time Calculation (min) 40 min    Activity Tolerance Patient tolerated treatment well    Behavior During Therapy [facility] for tasks assessed/performed             Past Medical History:  Diagnosis Date   Allergy    Arthritis    Asthma    Back pain    Benign positional vertigo    Bilateral swelling of feet    Bronchitis    Chicken pox    Colon polyps    Constipation    Cough 10/09/2020   COVID-19 10/09/2020   Diverticulitis    Early menopause occurring in patient age younger than 70 years    Fibromyalgia    GERD (gastroesophageal reflux disease)    Gluten intolerance    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 Still unclear what is cuasing her pain. May be nerve related. May benefit from starting on gabapentin. Start gabapentin 300 mg TID. F/u 4 weeks   Night sweats    Palpitations    Recurrent upper respiratory infection (URI)    Right arm weakness 09/28/2015   Last Assessment & Plan:  Formatting of this note might be different from the original. Referral to PT today. Suspect residual after stroke in left basal ganglia   Sleep apnea    Snoring 01/04/2019   Stroke (cerebrum) ([place]) 2017   right UE/neck sx, resolved.    Vertigo 08/11/2015   Last Assessment & Plan:  Formatting of this note might be different from the original. Concern given gradual onset over the last week and a  half in addition to neck pain. Patient needs neuroimaging to rule out dissecting vertebral aneurysm. Patient advised to go to emergency room. She declined ambulance transport   Vitamin B 12 deficiency    Vitamin D deficiency    Past Surgical History:  Procedure Laterality Date   CARPAL TUNNEL RELEASE     COLONOSCOPY  2015   [name]- [facility] [place]; 5 yr repeat recommended   COLONOSCOPY W/ POLYPECTOMY  05/26/2018   x5 polyps   KNEE ARTHROSCOPY Bilateral 2013   meniscus  x2   NECK SURGERY  2018   disc fusion   UPPER GASTROINTESTINAL ENDOSCOPY     Patient Active Problem List   Diagnosis Date Noted   Other fatigue 01/21/2022   SOB (shortness of breath) 01/21/2022   Depression screening 01/21/2022   Class 1 obesity without serious comorbidity with body mass index (BMI) of 31.0 to 31.9 in adult 01/21/2022   Vitamin D deficiency 01/21/2022   Abnormal glucose 01/21/2022   Other hyperlipidemia 01/21/2022   Other tear of medial meniscus, current injury, left knee, subsequent encounter 01/14/2022   Chronic rhinitis 03/28/2021   Other adverse food reactions, not elsewhere classified, subsequent encounter 03/28/2021  Diastolic dysfunction 56/43/3295   Heart murmur 02/16/2019   Hyperlipidemia LDL goal <100 02/16/2019   Class 1 obesity with serious comorbidity and body mass index (BMI) of 30.0 to 30.9 in adult 01/04/2019   Pelvic floor dysfunction in female 12/06/2018   Constipation due to outlet dysfunction 12/06/2018   Lactose intolerance 12/06/2018   Diverticulosis 05/27/2018   Osteopenia 01/19/2018   Irritable bowel syndrome with constipation 01/17/2018   History of colonic polyp 01/17/2018   History of stroke 01/15/2018   GERD (gastroesophageal reflux disease)    Asthma    S/P cervical spinal fusion 09/29/2017   Macrocytosis without anemia 11/25/2015   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   Heart palpitations 09/08/2015   Vitamin B12 deficiency 04/06/2014   Herpes simplex  type 1 infection 12/27/2010   Vestibular neuritis 10/15/2006   Family history of malignant neoplasm of breast 03/18/2006   Diaphragmatic hernia 03/18/2006   Premature menopause 03/18/2006    PCP: Ma Hillock, DO  REFERRING PROVIDER: Leandrew Koyanagi, MD   REFERRING DIAG: [phone] (ICD-10-CM) - Primary osteoarthritis of left knee   THERAPY DIAG:  Chronic pain of left knee  Difficulty in walking, not elsewhere classified  Muscle weakness (generalized)  Rationale for Evaluation and Treatment Rehabilitation  ONSET DATE: 01/07/2022  SUBJECTIVE:   SUBJECTIVE STATEMENT: Pt reports that she was racing with her 81 year old grandson and picked up her 14 year old granddaughter and carried her up the stairs to bed.  She states that she started noting increased pain after the weekend caring for her grandchildren that weekend.  Pt reports that she remembers hearing a pop in her left knee when she was standing up and had difficulty walking.  PERTINENT HISTORY: CVA, allergies, osteopenia, diverticulosis, Hx of cervical fusion, vestibular neuritis/BPPV, lumbar herniated disc, sleep apnea PAIN:  Are you having pain? Yes: NPRS scale: 0-6/10 Pain location: left knee Pain description: sharp Aggravating factors: movement Relieving factors: rest  PRECAUTIONS: None  WEIGHT BEARING RESTRICTIONS: No  FALLS:  Has patient fallen in last 6 months? No  LIVING ENVIRONMENT: Lives with: lives with their spouse Lives in: House/apartment Stairs: Yes: Internal: 15 steps; on left going up and External: 2 steps; none Has following equipment at home: None  OCCUPATION: Retired Education officer, museum  PLOF: Independent and Leisure: playing with grandchildren, sewing, gardening  PATIENT GOALS: To be able to walk and get some weight off and enjoy my grandchildren and go on hikes if I want to.   OBJECTIVE:   DIAGNOSTIC FINDINGS:  MRI of left knee on 01/24/22: IMPRESSION: 1. Severe attenuation of the body of  the medial meniscus which may reflect prior meniscectomy versus maceration. Degeneration of the remainder of the anterior horn and posterior horns of the medial meniscus. Small oblique tear of the posterior horn of the medial meniscus extending to the superior articular surface. 2. Complex signal in the posterior horn of the lateral meniscus concerning for a tear. 3. Cartilage fissuring of the patellar apex. Partial-thickness cartilage loss of the patellofemoral compartment most severe in the medial patellofemoral compartment. 4. Extensive full-thickness cartilage loss of the medial femorotibial compartment with subchondral reactive marrow edema in the medial tibial plateau.  PATIENT SURVEYS:  FOTO 40% (projected 56% by visit 15)  COGNITION: Overall cognitive status: Within functional limits for tasks assessed     SENSATION: Reports some numbness/tingling down left LE  EDEMA:  Circumferential: right 43 cm, left 43.5 cm  MUSCLE LENGTH: Bilat hamstring tightness noted  POSTURE: rounded shoulders  PALPATION: Tenderness to palpation along right knee medial joint line  LOWER EXTREMITY ROM:  WFL  LOWER EXTREMITY MMT:  Eval:  Bilateral hips 4/5, Left quad/hamstring 4/5, right quad/hamstring 5/5  LOWER EXTREMITY SPECIAL TESTS:  Knee special tests: Anterior drawer test: negative, Posterior drawer test: positive , and Patellafemoral grind test: negative  FUNCTIONAL TESTS:  5 times sit to stand: 20.3 sec with pushing up from thighs  GAIT: Distance walked: >100 ft Assistive device utilized: None Level of assistance: Complete Independence Comments: Antalgic gait pattern   TODAY'S TREATMENT:  DATE: 02/01/2022 Reviewed HEP (see below)  PATIENT EDUCATION:  Education details: Issued HEP Person educated: Patient Education method: Explanation, Demonstration, and Handouts Education comprehension: verbalized understanding and returned demonstration  HOME EXERCISE  PROGRAM: Access Code: [email or web address] URL: [email or web address] Date: 02/01/2022 Prepared by: Shelby Dubin Seichi Kaufhold  Exercises - Seated Hamstring Stretch  - 1 x daily - 7 x weekly - 1 sets - 2 reps - 20 sec hold - Seated Piriformis Stretch  - 1 x daily - 7 x weekly - 1 sets - 2 reps - 20 sec hold - Seated Long Arc Quad  - 1 x daily - 7 x weekly - 2 sets - 10 reps - Seated March  - 1 x daily - 7 x weekly - 2 sets - 10 reps - Seated Quad Set  - 1 x daily - 7 x weekly - 2 sets - 10 reps  ASSESSMENT:  CLINICAL IMPRESSION: Patient is a 66 y.o. female who was seen today for physical therapy evaluation and treatment for left knee OA. Pts PLOF is independent and able to play with her grandchildren or go on hikes with family.  Pt states that after a weekend caring for her 3 young grandchildren she was very fatigued and had some soreness.  On 01/07/22, she remembers standing up and feeling/hearing a popping noise from her left knee and has pain since that time.  Pt reports PMH of a CVA and states that she has intermittent dizziness at times.  Pt denies any current dizziness. Pt presents with right knee pain, muscle weakness, hamstring tightness, and difficulty walking.  Pt would benefit from skilled PT to address her functional impairments.  OBJECTIVE IMPAIRMENTS: decreased balance, difficulty walking, decreased strength, impaired flexibility, and pain.   ACTIVITY LIMITATIONS: lifting, bending, squatting, stairs, and transfers  PARTICIPATION LIMITATIONS: cleaning, community activity, and yard work  PERSONAL FACTORS: Past/current experiences and 3+ comorbidities: CVA, vertigo, OA  are also affecting patient's functional outcome.   REHAB POTENTIAL: Good  CLINICAL DECISION MAKING: Stable/uncomplicated  EVALUATION COMPLEXITY: Moderate   GOALS: Goals reviewed with patient? Yes  SHORT TERM GOALS: Target date: 02/22/2022  Pt will be independent with initial HEP. Baseline: Goal status:  INITIAL  2.  Pt will decreased time with 5 times sit to/from stand to 15 sec or less to demonstrate improved functional strength. Baseline: 20.3 sec Goal status: INITIAL   LONG TERM GOALS: Target date: 03/29/2022   Pt will be independent with advanced HEP. Baseline:  Goal status: INITIAL  2.  Pt will increase FOTO to at least 56% to demonstrate improvements in functional mobility. Baseline: 40% Goal status: INITIAL  3.  Pt will increase bilateral hip and left knee strength to at least 4+ to 5/5 to allow her to navigate stairs in home with increased ease. Baseline:  Goal status: INITIAL  4.  Pt will report ability to walk for greater than 20 minutes without an increase in knee pain to allow pt  to return to walking with her family. Baseline:  Goal status: INITIAL  5.  Pt will deny any dizziness during typical functional tasks. Baseline:  Goal status: INITIAL   PLAN:  PT FREQUENCY: 2x/week  PT DURATION: 8 weeks  PLANNED INTERVENTIONS: Therapeutic exercises, Therapeutic activity, Neuromuscular re-education, Balance training, Gait training, Patient/Family education, Self Care, Joint mobilization, Joint manipulation, Stair training, Vestibular training, Canalith repositioning, Aquatic Therapy, Dry Needling, Electrical stimulation, Spinal manipulation, Spinal mobilization, Cryotherapy, Moist heat, Taping, Vasopneumatic device, Ultrasound, Ionotophoresis '4mg'$ /ml Dexamethasone, Manual therapy, and Re-evaluation  PLAN FOR NEXT SESSION: assess and progress HEP as indicated, strengthening, flexibility   Juel Burrow, PT 02/01/2022, 9:36 AM   [facility] [address] [place], [place] [address] Phone # [phone] Fax [phone]

## 2022-02-01 NOTE — Patient Instructions (Signed)
     [facility] Welcome to [facility]! Here you will find all the information you will need regarding your pool therapy. If you have further questions at any time, please call our office at [phone]. After completing your initial evaluation in the [facility], you may be eligible to complete a portion of your therapy in the pool. A typical week of therapy will consist of 1-2 typical physical therapy visits at our [facility] location and an additional session of therapy in the pool located at the [facility] at [address]. [address], [place] [address]. The phone number at the pool site is [phone]. Please call this number if you are running late or need to cancel your appointment.  Aquatic therapy will be offered on Wednesday mornings and Friday afternoons. Each session will last approximately 45 minutes. All scheduling and payments for aquatic therapy sessions, including cancelations, will be done through our [facility] location.  To be eligible for aquatic therapy, these criteria must be met: You must be able to independently change in the locker room and get to the pool deck. A caregiver can come with you to help if needed. There are benches for a caregiver to sit on next to the pool. No one with an open wound is permitted in the pool.  Handicap parking is available in the front and there is a drop off option for even closer accessibility. Please arrive 15 minutes prior to your appointment to prepare for your pool session. You must sign in at the front desk upon your arrival. Please be sure to attend to any toileting needs prior to entering the pool. Locker rooms for changing are available.  There is direct access to the pool deck from the locker room. You can lock your belongings in a locker or bring them with you poolside. Your therapist will greet you on the pool deck. There may be other swimmers in the pool at the  same time but your session is one-on-one with the therapist.   

## 2022-02-04 ENCOUNTER — Ambulatory Visit (INDEPENDENT_AMBULATORY_CARE_PROVIDER_SITE_OTHER): Payer: Medicare Other | Admitting: Internal Medicine

## 2022-02-04 ENCOUNTER — Encounter (INDEPENDENT_AMBULATORY_CARE_PROVIDER_SITE_OTHER): Payer: Self-pay | Admitting: Internal Medicine

## 2022-02-04 VITALS — BP 136/78 | HR 59 | Temp 97.6°F | Ht 64.0 in | Wt 181.8 lb

## 2022-02-04 DIAGNOSIS — E669 Obesity, unspecified: Secondary | ICD-10-CM | POA: Diagnosis not present

## 2022-02-04 DIAGNOSIS — Z6831 Body mass index (BMI) 31.0-31.9, adult: Secondary | ICD-10-CM | POA: Diagnosis not present

## 2022-02-04 DIAGNOSIS — E7849 Other hyperlipidemia: Secondary | ICD-10-CM

## 2022-02-04 DIAGNOSIS — E88819 Insulin resistance, unspecified: Secondary | ICD-10-CM | POA: Diagnosis not present

## 2022-02-07 ENCOUNTER — Ambulatory Visit: Payer: Medicare Other

## 2022-02-07 DIAGNOSIS — M6281 Muscle weakness (generalized): Secondary | ICD-10-CM

## 2022-02-07 DIAGNOSIS — G8929 Other chronic pain: Secondary | ICD-10-CM

## 2022-02-07 DIAGNOSIS — M1712 Unilateral primary osteoarthritis, left knee: Secondary | ICD-10-CM | POA: Diagnosis not present

## 2022-02-07 DIAGNOSIS — R262 Difficulty in walking, not elsewhere classified: Secondary | ICD-10-CM

## 2022-02-07 NOTE — Therapy (Signed)
OUTPATIENT PHYSICAL THERAPY LOWER EXTREMITY EVALUATION   Patient Name: Kayla Rosales MRN: [id number] DOB:[birth year], 66 y.o., female Today's Date: 02/07/2022   PT End of Session - 02/07/22 1140     Visit Number 2    Date for PT Re-Evaluation 03/29/22    Authorization Type Medicare/[employer]    Progress Note Due on Visit 10    PT Start Time 1103    PT Stop Time 1137    PT Time Calculation (min) 34 min              Past Medical History:  Diagnosis Date   Allergy    Arthritis    Asthma    Back pain    Benign positional vertigo    Bilateral swelling of feet    Bronchitis    Chicken pox    Colon polyps    Constipation    Cough 10/09/2020   COVID-19 10/09/2020   Diverticulitis    Early menopause occurring in patient age younger than 42 years    Fibromyalgia    GERD (gastroesophageal reflux disease)    Gluten intolerance    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 Still unclear what is cuasing her pain. May be nerve related. May benefit from starting on gabapentin. Start gabapentin 300 mg TID. F/u 4 weeks   Night sweats    Palpitations    Recurrent upper respiratory infection (URI)    Right arm weakness 09/28/2015   Last Assessment & Plan:  Formatting of this note might be different from the original. Referral to PT today. Suspect residual after stroke in left basal ganglia   Sleep apnea    Snoring 01/04/2019   Stroke (cerebrum) ([place]) 2017   right UE/neck sx, resolved.    Vertigo 08/11/2015   Last Assessment & Plan:  Formatting of this note might be different from the original. Concern given gradual onset over the last week and a half in addition to neck pain. Patient needs neuroimaging to rule out dissecting vertebral aneurysm. Patient advised  to go to emergency room. She declined ambulance transport   Vitamin B 12 deficiency    Vitamin D deficiency    Past Surgical History:  Procedure Laterality Date   CARPAL TUNNEL RELEASE     COLONOSCOPY  2015   [name]- [facility], [place]; 5 yr repeat recommended   COLONOSCOPY W/ POLYPECTOMY  05/26/2018   x5 polyps   KNEE ARTHROSCOPY Bilateral 2013   meniscus  x2   NECK SURGERY  2018   disc fusion   UPPER GASTROINTESTINAL ENDOSCOPY     Patient Active Problem List   Diagnosis Date Noted   Insulin resistance 02/04/2022   Other fatigue 01/21/2022   SOB (shortness of breath) 01/21/2022   Depression screening 01/21/2022   Class 1 obesity without serious comorbidity with body mass index (BMI) of 31.0 to 31.9 in adult 01/21/2022   Vitamin D deficiency 01/21/2022   Abnormal glucose 01/21/2022   Other hyperlipidemia 01/21/2022   Other tear of medial meniscus, current injury, left knee, subsequent encounter 01/14/2022   Chronic rhinitis 03/28/2021   Other adverse food reactions, not elsewhere classified, subsequent encounter 62/69/4854   Diastolic dysfunction 62/70/3500   Heart murmur 02/16/2019   Hyperlipidemia LDL goal <  100 02/16/2019   Class 1 obesity with serious comorbidity and body mass index (BMI) of 30.0 to 30.9 in adult 01/04/2019   Pelvic floor dysfunction in female 12/06/2018   Constipation due to outlet dysfunction 12/06/2018   Lactose intolerance 12/06/2018   Diverticulosis 05/27/2018   Osteopenia 01/19/2018   Irritable bowel syndrome with constipation 01/17/2018   History of colonic polyp 01/17/2018   History of stroke 01/15/2018   GERD (gastroesophageal reflux disease)    Asthma    S/P cervical spinal fusion 09/29/2017   Macrocytosis without anemia 11/25/2015   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   Heart palpitations 09/08/2015   Vitamin B12 deficiency 04/06/2014   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   Vestibular neuritis 10/15/2006   Family history of  malignant neoplasm of breast 03/18/2006   Diaphragmatic hernia 03/18/2006   Premature menopause 03/18/2006    PCP: Ma Hillock, DO  REFERRING PROVIDER: Leandrew Koyanagi, MD   REFERRING DIAG: [phone] (ICD-10-CM) - Primary osteoarthritis of left knee   THERAPY DIAG:  Chronic pain of left knee  Difficulty in walking, not elsewhere classified  Muscle weakness (generalized)  Rationale for Evaluation and Treatment Rehabilitation  ONSET DATE: 01/07/2022  SUBJECTIVE:   SUBJECTIVE STATEMENT: I haven't been doing my exercises much.  I realized there is an app and I think that would help.  Overall, my knee feels better because I haven't been as much active.    PERTINENT HISTORY: CVA, allergies, osteopenia, diverticulosis, Hx of cervical fusion, vestibular neuritis/BPPV, lumbar herniated disc, sleep apnea PAIN:  Are you having pain? Yes: NPRS scale: 3/10 Pain location: left knee Pain description: sharp Aggravating factors: movement Relieving factors: rest  PRECAUTIONS: None  WEIGHT BEARING RESTRICTIONS: No  FALLS:  Has patient fallen in last 6 months? No  LIVING ENVIRONMENT: Lives with: lives with their spouse Lives in: House/apartment Stairs: Yes: Internal: 15 steps; on left going up and External: 2 steps; none Has following equipment at home: None  OCCUPATION: Retired Education officer, museum  PLOF: Independent and Leisure: playing with grandchildren, sewing, gardening  PATIENT GOALS: To be able to walk and get some weight off and enjoy my grandchildren and go on hikes if I want to.  OBJECTIVE:   DIAGNOSTIC FINDINGS:  MRI of left knee on 01/24/22: IMPRESSION: 1. Severe attenuation of the body of the medial meniscus which may reflect prior meniscectomy versus maceration. Degeneration of the remainder of the anterior horn and posterior horns of the medial meniscus. Small oblique tear of the posterior horn of the medial meniscus extending to the superior articular surface. 2.  Complex signal in the posterior horn of the lateral meniscus concerning for a tear. 3. Cartilage fissuring of the patellar apex. Partial-thickness cartilage loss of the patellofemoral compartment most severe in the medial patellofemoral compartment. 4. Extensive full-thickness cartilage loss of the medial femorotibial compartment with subchondral reactive marrow edema in the medial tibial plateau.  PATIENT SURVEYS:  FOTO 40% (projected 56% by visit 15)  COGNITION: Overall cognitive status: Within functional limits for tasks assessed     SENSATION: Reports some numbness/tingling down left LE  EDEMA:  Circumferential: right 43 cm, left 43.5 cm  MUSCLE LENGTH: Bilat hamstring tightness noted  POSTURE: rounded shoulders  PALPATION: Tenderness to palpation along right knee medial joint line  LOWER EXTREMITY ROM:  WFL  LOWER EXTREMITY MMT:  Eval:  Bilateral hips 4/5, Left quad/hamstring 4/5, right quad/hamstring 5/5  LOWER EXTREMITY SPECIAL TESTS:  Knee special tests: Anterior drawer test: negative, Posterior drawer  test: positive , and Patellafemoral grind test: negative  FUNCTIONAL TESTS:  5 times sit to stand: 20.3 sec with pushing up from thighs  GAIT: Distance walked: >100 ft Assistive device utilized: None Level of assistance: Complete Independence Comments: Antalgic gait pattern   TODAY'S TREATMENT: DATE: 02/07/22 NuStep: Level 5x 6 minutes-PT present to discuss progress.  Seated hamstring stretch 3x20 seconds  Seated piriformis stretch 3x20 seconds  Long arc quads 5" hold x10 Seated marching x20 each Weight shifting on balance pad x1 min each-verbal cues to reduce eversion at foot and knee hyperextension. Standing rockerboard x 3 minutes   DATE: 02/01/2022 Reviewed HEP (see below)  PATIENT EDUCATION:  Education details: Issued HEP Person educated: Patient Education method: Explanation, Demonstration, and Handouts Education comprehension: verbalized  understanding and returned demonstration  HOME EXERCISE PROGRAM: Access Code: [email or web address] URL: [email or web address] Date: 02/01/2022 Prepared by: Shelby Dubin Menke  Exercises - Seated Hamstring Stretch  - 1 x daily - 7 x weekly - 1 sets - 2 reps - 20 sec hold - Seated Piriformis Stretch  - 1 x daily - 7 x weekly - 1 sets - 2 reps - 20 sec hold - Seated Long Arc Quad  - 1 x daily - 7 x weekly - 2 sets - 10 reps - Seated March  - 1 x daily - 7 x weekly - 2 sets - 10 reps - Seated Quad Set  - 1 x daily - 7 x weekly - 2 sets - 10 reps  ASSESSMENT:  CLINICAL IMPRESSION: First time follow-up after evaluation. Pt reports minimal compliance with HEP and PT emphasized the importance of compliance to improve pain and function.  Pt tolerated all exercises well in the clinic without increased pain.  Pt with antalgia on level surface. PT provided supervision and provided verbal cues for alignment and technique as needed.  Pt would benefit from skilled PT to address her functional impairments.  OBJECTIVE IMPAIRMENTS: decreased balance, difficulty walking, decreased strength, impaired flexibility, and pain.   ACTIVITY LIMITATIONS: lifting, bending, squatting, stairs, and transfers  PARTICIPATION LIMITATIONS: cleaning, community activity, and yard work  PERSONAL FACTORS: Past/current experiences and 3+ comorbidities: CVA, vertigo, OA  are also affecting patient's functional outcome.   REHAB POTENTIAL: Good  CLINICAL DECISION MAKING: Stable/uncomplicated  EVALUATION COMPLEXITY: Moderate   GOALS: Goals reviewed with patient? Yes  SHORT TERM GOALS: Target date: 02/22/2022  Pt will be independent with initial HEP. Baseline: Goal status: in progress  2.  Pt will decreased time with 5 times sit to/from stand to 15 sec or less to demonstrate improved functional strength. Baseline: 20.3 sec Goal status: INITIAL   LONG TERM GOALS: Target date: 03/29/2022   Pt will be independent with  advanced HEP. Baseline:  Goal status: INITIAL  2.  Pt will increase FOTO to at least 56% to demonstrate improvements in functional mobility. Baseline: 40% Goal status: INITIAL  3.  Pt will increase bilateral hip and left knee strength to at least 4+ to 5/5 to allow her to navigate stairs in home with increased ease. Baseline:  Goal status: INITIAL  4.  Pt will report ability to walk for greater than 20 minutes without an increase in knee pain to allow pt to return to walking with her family. Baseline:  Goal status: INITIAL  5.  Pt will deny any dizziness during typical functional tasks. Baseline:  Goal status: INITIAL   PLAN:  PT FREQUENCY: 2x/week  PT DURATION: 8 weeks  PLANNED INTERVENTIONS: Therapeutic exercises, Therapeutic activity, Neuromuscular  re-education, Balance training, Gait training, Patient/Family education, Self Care, Joint mobilization, Joint manipulation, Stair training, Vestibular training, Canalith repositioning, Aquatic Therapy, Dry Needling, Electrical stimulation, Spinal manipulation, Spinal mobilization, Cryotherapy, Moist heat, Taping, Vasopneumatic device, Ultrasound, Ionotophoresis '4mg'$ /ml Dexamethasone, Manual therapy, and Re-evaluation  PLAN FOR NEXT SESSION: assess and progress HEP as indicated, strengthening, flexibility   Sigurd Sos, PT 02/07/22 11:41 AM   [facility] [address] [place], [place] [address] Phone # [phone] Fax [phone]

## 2022-02-13 ENCOUNTER — Telehealth: Payer: Self-pay

## 2022-02-13 ENCOUNTER — Ambulatory Visit: Payer: Medicare Other | Attending: Orthopaedic Surgery

## 2022-02-13 DIAGNOSIS — G8929 Other chronic pain: Secondary | ICD-10-CM | POA: Insufficient documentation

## 2022-02-13 DIAGNOSIS — M25562 Pain in left knee: Secondary | ICD-10-CM | POA: Insufficient documentation

## 2022-02-13 DIAGNOSIS — R262 Difficulty in walking, not elsewhere classified: Secondary | ICD-10-CM | POA: Insufficient documentation

## 2022-02-13 DIAGNOSIS — M6281 Muscle weakness (generalized): Secondary | ICD-10-CM | POA: Insufficient documentation

## 2022-02-13 NOTE — Telephone Encounter (Signed)
PT called pt and left message on voicemail due to missed appt at 11:00 today.

## 2022-02-13 NOTE — Progress Notes (Unsigned)
Chief Complaint:   OBESITY Kayla Rosales is here to discuss her progress with her obesity treatment plan along with follow-up of her obesity related diagnoses. Kayla Rosales is on the Category 2 Plan and states she is following her eating plan approximately 0% of the time. Kayla Rosales states she is doing 0 minutes 0 times per week.  Today's visit was #: 2 Starting weight: 181 lbs Starting date: 01/21/2022 Today's weight: 181 lbs Today's date: 02/04/2022 Total lbs lost to date: 0 Total lbs lost since last in-office visit: 0  Interim History: Kayla Rosales is having some difficulties with implementation of meal plan.  She doesn't feel full when she eats and may not be hitting her protein targets. She has problems with snacking in the afternoon and dinner. She has reduced or eliminated liquid calories.   Subjective:   1. Other hyperlipidemia Kayla Rosales's recent LDL was 65 and she is on statin without side effects. I discussed labs with the patient today.   2. Insulin resistance Kayla Rosales's insulin level was at 12.9, optimal <7. This may contribute to cravings and decreased satiety. I discussed labs with the patient today.   Assessment/Plan:   1. Other hyperlipidemia Kayla Rosales will continue with her weight loss therapy, and she will continue moderate intensity atorvastatin.   2. Insulin resistance Kayla Rosales will improve implementation of high-protein meal plan.  Behavioral modification around cravings versus hunger was discussed.  We may consider pharmacotherapy if cravings continues to affect patient.  3. Obesity with current BMI 31.2 Kayla Rosales is currently in the action stage of change. As such, her goal is to continue with weight loss efforts. She has agreed to the Category 2 Plan.   She will work on measuring protein intake to ensure she is meeting prescribed target.  This should help decrease cravings and improve satiety.  Exercise goals: No exercise has been prescribed at this time.  Behavioral modification  strategies: increasing lean protein intake, no skipping meals, meal planning and cooking strategies, keeping healthy foods in the home, ways to avoid night time snacking, better snacking choices, avoiding temptations, and planning for success.  Kayla Rosales has agreed to follow-up with our clinic in 2 weeks. She was informed of the importance of frequent follow-up visits to maximize her success with intensive lifestyle modifications for her multiple health conditions.   Objective:   Blood pressure 136/78, pulse (!) 59, temperature 97.6 F (36.4 C), height '5\' 4"'$  (1.626 m), weight 181 lb 12.8 oz (82.5 kg), SpO2 98 %. Body mass index is 31.21 kg/m.  General: Cooperative, alert, well developed, in no acute distress. HEENT: Conjunctivae and lids unremarkable. Cardiovascular: Regular rhythm.  Lungs: Normal work of breathing. Neurologic: No focal deficits.   Lab Results  Component Value Date   CREATININE 0.75 09/12/2021   BUN 14 09/12/2021   NA 140 09/12/2021   K 4.7 09/12/2021   CL 105 09/12/2021   CO2 27 09/12/2021   Lab Results  Component Value Date   ALT 21 12/27/2020   AST 19 12/27/2020   ALKPHOS 75 12/27/2020   BILITOT 0.9 12/27/2020   Lab Results  Component Value Date   HGBA1C 5.3 09/12/2021   HGBA1C 5.4 03/19/2021   HGBA1C 5.1 12/09/2019   HGBA1C 5.4 08/17/2019   HGBA1C 5.3 12/17/2018   Lab Results  Component Value Date   INSULIN 12.9 01/21/2022   INSULIN 12.7 12/09/2019   INSULIN 20.0 08/17/2019   Lab Results  Component Value Date   TSH 1.290 01/21/2022   Lab Results  Component Value Date   CHOL 132 01/21/2022   HDL 45 01/21/2022   LDLCALC 65 01/21/2022   TRIG 121 01/21/2022   CHOLHDL 3 03/19/2021   Lab Results  Component Value Date   VD25OH 31.53 03/19/2021   VD25OH 54.9 12/09/2019   VD25OH 42.4 08/17/2019   Lab Results  Component Value Date   WBC 5.0 12/27/2020   HGB 14.5 12/27/2020   HCT 41.7 12/27/2020   MCV 99.5 12/27/2020   PLT 225.0 12/27/2020    Lab Results  Component Value Date   IRON 73 06/15/2019   TIBC 263 06/15/2019   FERRITIN 166 (H) 06/15/2019   Attestation Statements:   Reviewed by clinician on day of visit: allergies, medications, problem list, medical history, surgical history, family history, social history, and previous encounter notes.  Time spent on visit including pre-visit chart review and post-visit care and charting was 40 minutes.   Wilhemena Durie, am acting as transcriptionist for Thomes Dinning, MD.  I have reviewed the above documentation for accuracy and completeness, and I agree with the above. -Thomes Dinning, MD

## 2022-02-14 ENCOUNTER — Encounter: Payer: Medicare Other | Admitting: Rehabilitative and Restorative Service Providers"

## 2022-02-15 ENCOUNTER — Encounter: Payer: Self-pay | Admitting: Physical Therapy

## 2022-02-15 ENCOUNTER — Ambulatory Visit: Payer: Medicare Other | Admitting: Physical Therapy

## 2022-02-15 DIAGNOSIS — G8929 Other chronic pain: Secondary | ICD-10-CM | POA: Diagnosis present

## 2022-02-15 DIAGNOSIS — R262 Difficulty in walking, not elsewhere classified: Secondary | ICD-10-CM | POA: Diagnosis present

## 2022-02-15 DIAGNOSIS — M6281 Muscle weakness (generalized): Secondary | ICD-10-CM

## 2022-02-15 DIAGNOSIS — M25562 Pain in left knee: Secondary | ICD-10-CM | POA: Diagnosis not present

## 2022-02-15 NOTE — Therapy (Signed)
OUTPATIENT PHYSICAL THERAPY LOWER EXTREMITY EVALUATION   Patient Name: Kayla Rosales MRN: [id number] DOB:11-19-1955, 66 y.o., female Today's Date: 02/15/2022   PT End of Session - 02/15/22 1216     Visit Number 3    Date for PT Re-Evaluation 03/29/22    Authorization Type Medicare/[employer]    Progress Note Due on Visit 10    PT Start Time 1215    PT Stop Time 1300    PT Time Calculation (min) 45 min    Activity Tolerance Patient tolerated treatment well    Behavior During Therapy [facility] for tasks assessed/performed               Past Medical History:  Diagnosis Date   Allergy    Arthritis    Asthma    Back pain    Benign positional vertigo    Bilateral swelling of feet    Bronchitis    Chicken pox    Colon polyps    Constipation    Cough 10/09/2020   COVID-19 10/09/2020   Diverticulitis    Early menopause occurring in patient age younger than 91 years    Fibromyalgia    GERD (gastroesophageal reflux disease)    Gluten intolerance    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 Still unclear what is cuasing her pain. May be nerve related. May benefit from starting on gabapentin. Start gabapentin 300 mg TID. F/u 4 weeks   Night sweats    Palpitations    Recurrent upper respiratory infection (URI)    Right arm weakness 09/28/2015   Last Assessment & Plan:  Formatting of this note might be different from the original. Referral to PT today. Suspect residual after stroke in left basal ganglia   Sleep apnea    Snoring 01/04/2019   Stroke (cerebrum) ([place]) 2017   right UE/neck sx, resolved.    Vertigo 08/11/2015   Last Assessment & Plan:  Formatting of this note might be different from the original. Concern given gradual onset over the last week and a  half in addition to neck pain. Patient needs neuroimaging to rule out dissecting vertebral aneurysm. Patient advised to go to emergency room. She declined ambulance transport   Vitamin B 12 deficiency    Vitamin D deficiency    Past Surgical History:  Procedure Laterality Date   CARPAL TUNNEL RELEASE     COLONOSCOPY  2015   [name]- [facility], [place]; 5 yr repeat recommended   COLONOSCOPY W/ POLYPECTOMY  05/26/2018   x5 polyps   KNEE ARTHROSCOPY Bilateral 2013   meniscus  x2   NECK SURGERY  2018   disc fusion   UPPER GASTROINTESTINAL ENDOSCOPY     Patient Active Problem List   Diagnosis Date Noted   Insulin resistance 02/04/2022   Other fatigue 01/21/2022   SOB (shortness of breath) 01/21/2022   Depression screening 01/21/2022   Class 1 obesity without serious comorbidity with body mass index (BMI) of 31.0 to 31.9 in adult 01/21/2022   Vitamin D deficiency 01/21/2022   Abnormal glucose 01/21/2022   Other hyperlipidemia 01/21/2022   Other tear of medial meniscus, current injury, left knee, subsequent encounter 01/14/2022   Chronic rhinitis 03/28/2021   Other adverse food reactions, not  elsewhere classified, subsequent encounter 20/35/5974   Diastolic dysfunction 16/38/4536   Heart murmur 02/16/2019   Hyperlipidemia LDL goal <100 02/16/2019   Class 1 obesity with serious comorbidity and body mass index (BMI) of 30.0 to 30.9 in adult 01/04/2019   Pelvic floor dysfunction in female 12/06/2018   Constipation due to outlet dysfunction 12/06/2018   Lactose intolerance 12/06/2018   Diverticulosis 05/27/2018   Osteopenia 01/19/2018   Irritable bowel syndrome with constipation 01/17/2018   History of colonic polyp 01/17/2018   History of stroke 01/15/2018   GERD (gastroesophageal reflux disease)    Asthma    S/P cervical spinal fusion 09/29/2017   Macrocytosis without anemia 11/25/2015   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   Heart palpitations 09/08/2015   Vitamin B12  deficiency 04/06/2014   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   Vestibular neuritis 10/15/2006   Family history of malignant neoplasm of breast 03/18/2006   Diaphragmatic hernia 03/18/2006   Premature menopause 03/18/2006    PCP: Ma Hillock, DO  REFERRING PROVIDER: Leandrew Koyanagi, MD   REFERRING DIAG: [phone] (ICD-10-CM) - Primary osteoarthritis of left knee   THERAPY DIAG:  Chronic pain of left knee  Difficulty in walking, not elsewhere classified  Muscle weakness (generalized)  Rationale for Evaluation and Treatment Rehabilitation  ONSET DATE: 01/07/2022  SUBJECTIVE:   SUBJECTIVE STATEMENT: My inner Lt knee pains me often. I think I need to work my quad more.   PERTINENT HISTORY: CVA, allergies, osteopenia, diverticulosis, Hx of cervical fusion, vestibular neuritis/BPPV, lumbar herniated disc, sleep apnea PAIN:  Are you having pain? Yes: NPRS scale: 3/10 Pain location: left knee Pain description: sharp Aggravating factors: movement Relieving factors: rest  PRECAUTIONS: None  WEIGHT BEARING RESTRICTIONS: No  FALLS:  Has patient fallen in last 6 months? No  LIVING ENVIRONMENT: Lives with: lives with their spouse Lives in: House/apartment Stairs: Yes: Internal: 15 steps; on left going up and External: 2 steps; none Has following equipment at home: None  OCCUPATION: Retired Education officer, museum  PLOF: Independent and Leisure: playing with grandchildren, sewing, gardening  PATIENT GOALS: To be able to walk and get some weight off and enjoy my grandchildren and go on hikes if I want to.  OBJECTIVE:   DIAGNOSTIC FINDINGS:  MRI of left knee on 01/24/22: IMPRESSION: 1. Severe attenuation of the body of the medial meniscus which may reflect prior meniscectomy versus maceration. Degeneration of the remainder of the anterior horn and posterior horns of the medial meniscus. Small oblique tear of the posterior horn of the medial meniscus extending to the  superior articular surface. 2. Complex signal in the posterior horn of the lateral meniscus concerning for a tear. 3. Cartilage fissuring of the patellar apex. Partial-thickness cartilage loss of the patellofemoral compartment most severe in the medial patellofemoral compartment. 4. Extensive full-thickness cartilage loss of the medial femorotibial compartment with subchondral reactive marrow edema in the medial tibial plateau.  PATIENT SURVEYS:  FOTO 40% (projected 56% by visit 15)  COGNITION: Overall cognitive status: Within functional limits for tasks assessed     SENSATION: Reports some numbness/tingling down left LE  EDEMA:  Circumferential: right 43 cm, left 43.5 cm  MUSCLE LENGTH: Bilat hamstring tightness noted  POSTURE: rounded shoulders  PALPATION: Tenderness to palpation along right knee medial joint line  LOWER EXTREMITY ROM:  WFL  LOWER EXTREMITY MMT:  Eval:  Bilateral hips 4/5, Left quad/hamstring 4/5, right quad/hamstring 5/5  LOWER EXTREMITY SPECIAL TESTS:  Knee special tests: Anterior drawer test: negative,  Posterior drawer test: positive , and Patellafemoral grind test: negative  FUNCTIONAL TESTS:  5 times sit to stand: 20.3 sec with pushing up from thighs  GAIT: Distance walked: >100 ft Assistive device utilized: None Level of assistance: Complete Independence Comments: Antalgic gait pattern   TODAY'S TREATMENT:  02/15/22: Pt arrives for aquatic physical therapy. Treatment took place in 3.5-5.5 feet of water. Water temperature was. Pt entered the pool via steps reciprocally but with caution and mild use of the rails. Pt requires buoyancy of water for support and to offload joints with strengthening exercises.   Seated water bench with 75% submersion Pt performed seated LE AROM exercises 20x in all planes, pain assessment concurrent.   Water walking 75% depth 6 lengths in all directions. No assistance, natural UE.  Education on how to stand  without hyperextending her knee and contracting her quad more. Pt has difficulty making quad contraction.  Hip 3 ways 15x Bil in each directions with mild UE support. Single limb stance LT as RTLE performed blue noodle single leg press 10x  DATE: 02/07/22 NuStep: Level 5x 6 minutes-PT present to discuss progress.  Seated hamstring stretch 3x20 seconds  Seated piriformis stretch 3x20 seconds  Long arc quads 5" hold x10 Seated marching x20 each Weight shifting on balance pad x1 min each-verbal cues to reduce eversion at foot and knee hyperextension. Standing rockerboard x 3 minutes   DATE: 02/01/2022 Reviewed HEP (see below)  PATIENT EDUCATION:  Education details: Issued HEP Person educated: Patient Education method: Explanation, Demonstration, and Handouts Education comprehension: verbalized understanding and returned demonstration  HOME EXERCISE PROGRAM: Access Code: [email or web address] URL: [email or web address] Date: 02/01/2022 Prepared by: Shelby Dubin Menke  Exercises - Seated Hamstring Stretch  - 1 x daily - 7 x weekly - 1 sets - 2 reps - 20 sec hold - Seated Piriformis Stretch  - 1 x daily - 7 x weekly - 1 sets - 2 reps - 20 sec hold - Seated Long Arc Quad  - 1 x daily - 7 x weekly - 2 sets - 10 reps - Seated March  - 1 x daily - 7 x weekly - 2 sets - 10 reps - Seated Quad Set  - 1 x daily - 7 x weekly - 2 sets - 10 reps  ASSESSMENT:  CLINICAL IMPRESSION: Pt arrives for first aquatic PT treatment. Pt arrives with mild medial knee pain. Pt has difficulty making a quad contraction without hyperextending her knee. Pt had no increased knee pain with her exercises in the water.  OBJECTIVE IMPAIRMENTS: decreased balance, difficulty walking, decreased strength, impaired flexibility, and pain.   ACTIVITY LIMITATIONS: lifting, bending, squatting, stairs, and transfers  PARTICIPATION LIMITATIONS: cleaning, community activity, and yard work  PERSONAL FACTORS: Past/current  experiences and 3+ comorbidities: CVA, vertigo, OA  are also affecting patient's functional outcome.   REHAB POTENTIAL: Good  CLINICAL DECISION MAKING: Stable/uncomplicated  EVALUATION COMPLEXITY: Moderate   GOALS: Goals reviewed with patient? Yes  SHORT TERM GOALS: Target date: 02/22/2022  Pt will be independent with initial HEP. Baseline: Goal status: in progress  2.  Pt will decreased time with 5 times sit to/from stand to 15 sec or less to demonstrate improved functional strength. Baseline: 20.3 sec Goal status: INITIAL   LONG TERM GOALS: Target date: 03/29/2022   Pt will be independent with advanced HEP. Baseline:  Goal status: INITIAL  2.  Pt will increase FOTO to at least 56% to demonstrate improvements in functional mobility. Baseline: 40% Goal status: INITIAL  3.  Pt will increase bilateral hip and left knee strength to at least 4+ to 5/5 to allow her to navigate stairs in home with increased ease. Baseline:  Goal status: INITIAL  4.  Pt will report ability to walk for greater than 20 minutes without an increase in knee pain to allow pt to return to walking with her family. Baseline:  Goal status: INITIAL  5.  Pt will deny any dizziness during typical functional tasks. Baseline:  Goal status: INITIAL   PLAN:  PT FREQUENCY: 2x/week  PT DURATION: 8 weeks  PLANNED INTERVENTIONS: Therapeutic exercises, Therapeutic activity, Neuromuscular re-education, Balance training, Gait training, Patient/Family education, Self Care, Joint mobilization, Joint manipulation, Stair training, Vestibular training, Canalith repositioning, Aquatic Therapy, Dry Needling, Electrical stimulation, Spinal manipulation, Spinal mobilization, Cryotherapy, Moist heat, Taping, Vasopneumatic device, Ultrasound, Ionotophoresis '4mg'$ /ml Dexamethasone, Manual therapy, and Re-evaluation  PLAN FOR NEXT SESSION: See how pt did post aquatics, continue to increase LT quad activation and gradual  strength.   Myrene Galas, PTA 02/15/22 9:14 PM    [facility] [address], [address] [place], [place] [address] Phone # [phone] Fax [phone]

## 2022-02-19 ENCOUNTER — Ambulatory Visit: Payer: Medicare Other | Admitting: Rehabilitative and Restorative Service Providers"

## 2022-02-19 ENCOUNTER — Encounter: Payer: Self-pay | Admitting: Rehabilitative and Restorative Service Providers"

## 2022-02-19 DIAGNOSIS — G8929 Other chronic pain: Secondary | ICD-10-CM

## 2022-02-19 DIAGNOSIS — R262 Difficulty in walking, not elsewhere classified: Secondary | ICD-10-CM

## 2022-02-19 DIAGNOSIS — M6281 Muscle weakness (generalized): Secondary | ICD-10-CM

## 2022-02-19 DIAGNOSIS — M25562 Pain in left knee: Secondary | ICD-10-CM | POA: Diagnosis not present

## 2022-02-19 NOTE — Therapy (Signed)
OUTPATIENT PHYSICAL THERAPY TREATMENT NOTE   Patient Name: Kayla Rosales MRN: [id number] DOB:[birth year], 66 y.o., female Today's Date: 02/19/2022   PT End of Session - 02/19/22 1450     Visit Number 4    Date for PT Re-Evaluation 03/29/22    Authorization Type Medicare/[employer]    Progress Note Due on Visit 10    PT Start Time 1447    PT Stop Time 1525    PT Time Calculation (min) 38 min    Activity Tolerance Patient tolerated treatment well    Behavior During Therapy [facility] for tasks assessed/performed               Past Medical History:  Diagnosis Date   Allergy    Arthritis    Asthma    Back pain    Benign positional vertigo    Bilateral swelling of feet    Bronchitis    Chicken pox    Colon polyps    Constipation    Cough 10/09/2020   COVID-19 10/09/2020   Diverticulitis    Early menopause occurring in patient age younger than 74 years    Fibromyalgia    GERD (gastroesophageal reflux disease)    Gluten intolerance    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 Still unclear what is cuasing her pain. May be nerve related. May benefit from starting on gabapentin. Start gabapentin 300 mg TID. F/u 4 weeks   Night sweats    Palpitations    Recurrent upper respiratory infection (URI)    Right arm weakness 09/28/2015   Last Assessment & Plan:  Formatting of this note might be different from the original. Referral to PT today. Suspect residual after stroke in left basal ganglia   Sleep apnea    Snoring 01/04/2019   Stroke (cerebrum) ([place]) 2017   right UE/neck sx, resolved.    Vertigo 08/11/2015   Last Assessment & Plan:  Formatting of this note might be different from the original. Concern given gradual onset over the last week and a half in  addition to neck pain. Patient needs neuroimaging to rule out dissecting vertebral aneurysm. Patient advised to go to emergency room. She declined ambulance transport   Vitamin B 12 deficiency    Vitamin D deficiency    Past Surgical History:  Procedure Laterality Date   CARPAL TUNNEL RELEASE     COLONOSCOPY  2015   [name]- [facility], [place]; 5 yr repeat recommended   COLONOSCOPY W/ POLYPECTOMY  05/26/2018   x5 polyps   KNEE ARTHROSCOPY Bilateral 2013   meniscus  x2   NECK SURGERY  2018   disc fusion   UPPER GASTROINTESTINAL ENDOSCOPY     Patient Active Problem List   Diagnosis Date Noted   Insulin resistance 02/04/2022   Other fatigue 01/21/2022   SOB (shortness of breath) 01/21/2022   Depression screening 01/21/2022   Class 1 obesity without serious comorbidity with body mass index (BMI) of 31.0 to 31.9 in adult 01/21/2022   Vitamin D deficiency 01/21/2022   Abnormal glucose 01/21/2022   Other hyperlipidemia 01/21/2022   Other tear of medial meniscus, current injury, left knee, subsequent encounter 01/14/2022   Chronic rhinitis 03/28/2021   Other adverse food reactions, not elsewhere  classified, subsequent encounter 63/04/6008   Diastolic dysfunction 93/23/5573   Heart murmur 02/16/2019   Hyperlipidemia LDL goal <100 02/16/2019   Class 1 obesity with serious comorbidity and body mass index (BMI) of 30.0 to 30.9 in adult 01/04/2019   Pelvic floor dysfunction in female 12/06/2018   Constipation due to outlet dysfunction 12/06/2018   Lactose intolerance 12/06/2018   Diverticulosis 05/27/2018   Osteopenia 01/19/2018   Irritable bowel syndrome with constipation 01/17/2018   History of colonic polyp 01/17/2018   History of stroke 01/15/2018   GERD (gastroesophageal reflux disease)    Asthma    S/P cervical spinal fusion 09/29/2017   Macrocytosis without anemia 11/25/2015   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   Heart palpitations 09/08/2015   Vitamin B12 deficiency  04/06/2014   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   Vestibular neuritis 10/15/2006   Family history of malignant neoplasm of breast 03/18/2006   Diaphragmatic hernia 03/18/2006   Premature menopause 03/18/2006    PCP: Ma Hillock, DO  REFERRING PROVIDER: Leandrew Koyanagi, MD   REFERRING DIAG: [phone] (ICD-10-CM) - Primary osteoarthritis of left knee   THERAPY DIAG:  Chronic pain of left knee  Difficulty in walking, not elsewhere classified  Muscle weakness (generalized)  Rationale for Evaluation and Treatment Rehabilitation  ONSET DATE: 01/07/2022  SUBJECTIVE:   SUBJECTIVE STATEMENT: Pt reports that she enjoyed the pool, but did have some soreness after the pool.  States that she has been working on strengthening her quad more.  PERTINENT HISTORY: CVA, allergies, osteopenia, diverticulosis, Hx of cervical fusion, vestibular neuritis/BPPV, lumbar herniated disc, sleep apnea PAIN:  Are you having pain? Yes: NPRS scale: 0-5/10 Pain location: left knee Pain description: sharp Aggravating factors: movement Relieving factors: rest  PRECAUTIONS: None  WEIGHT BEARING RESTRICTIONS: No  FALLS:  Has patient fallen in last 6 months? No  LIVING ENVIRONMENT: Lives with: lives with their spouse Lives in: House/apartment Stairs: Yes: Internal: 15 steps; on left going up and External: 2 steps; none Has following equipment at home: None  OCCUPATION: Retired Education officer, museum  PLOF: Independent and Leisure: playing with grandchildren, sewing, gardening  PATIENT GOALS: To be able to walk and get some weight off and enjoy my grandchildren and go on hikes if I want to.  OBJECTIVE:   DIAGNOSTIC FINDINGS:  MRI of left knee on 01/24/22: IMPRESSION: 1. Severe attenuation of the body of the medial meniscus which may reflect prior meniscectomy versus maceration. Degeneration of the remainder of the anterior horn and posterior horns of the medial meniscus. Small oblique tear of  the posterior horn of the medial meniscus extending to the superior articular surface. 2. Complex signal in the posterior horn of the lateral meniscus concerning for a tear. 3. Cartilage fissuring of the patellar apex. Partial-thickness cartilage loss of the patellofemoral compartment most severe in the medial patellofemoral compartment. 4. Extensive full-thickness cartilage loss of the medial femorotibial compartment with subchondral reactive marrow edema in the medial tibial plateau.  PATIENT SURVEYS:  Eval:  FOTO 40% (projected 56% by visit 15)  COGNITION: Overall cognitive status: Within functional limits for tasks assessed     SENSATION: Reports some numbness/tingling down left LE  EDEMA:  Circumferential: right 43 cm, left 43.5 cm  MUSCLE LENGTH: Bilat hamstring tightness noted  POSTURE: rounded shoulders  PALPATION: Tenderness to palpation along right knee medial joint line  LOWER EXTREMITY ROM:  WFL  LOWER EXTREMITY MMT:  Eval:  Bilateral hips 4/5, Left quad/hamstring 4/5, right quad/hamstring 5/5  LOWER  EXTREMITY SPECIAL TESTS:  Knee special tests: Anterior drawer test: negative, Posterior drawer test: positive , and Patellafemoral grind test: negative  FUNCTIONAL TESTS:  Eval:   5 times sit to stand: 20.3 sec with pushing up from thighs  02/19/2022: 3 Minute Walk Test:  643 ft with pain going up to 7/10 towards end of ambulation  GAIT: Distance walked: >100 ft Assistive device utilized: None Level of assistance: Complete Independence Comments: Antalgic gait pattern   TODAY'S TREATMENT:  DATE: 02/19/2022 NuStep: Level 5x 6 minutes-PT present to discuss progress Sit to/from stand (seated on purple foam for increased chair height) 2x10 Seated:  marching, LAQ with hold at TKE, heel/toe raises, hip adduction ball squeeze, hip abduction scissors.  2x10 bilat each Seated hamstring curls with red tband 2x10 bilat Seated hamstring stretch 1x20 seconds  bilat 3 minutes of ambulation in PT clinic for 643 ft   02/15/22: Pt arrives for aquatic physical therapy. Treatment took place in 3.5-5.5 feet of water. Water temperature was. Pt entered the pool via steps reciprocally but with caution and mild use of the rails. Pt requires buoyancy of water for support and to offload joints with strengthening exercises.   Seated water bench with 75% submersion Pt performed seated LE AROM exercises 20x in all planes, pain assessment concurrent.   Water walking 75% depth 6 lengths in all directions. No assistance, natural UE.  Education on how to stand without hyperextending her knee and contracting her quad more. Pt has difficulty making quad contraction.  Hip 3 ways 15x Bil in each directions with mild UE support. Single limb stance LT as RTLE performed blue noodle single leg press 10x  DATE: 02/07/22 NuStep: Level 5x 6 minutes-PT present to discuss progress.  Seated hamstring stretch 3x20 seconds  Seated piriformis stretch 3x20 seconds  Long arc quads 5" hold x10 Seated marching x20 each Weight shifting on balance pad x1 min each-verbal cues to reduce eversion at foot and knee hyperextension. Standing rockerboard x 3 minutes    PATIENT EDUCATION:  Education details: Issued HEP Person educated: Patient Education method: Explanation, Demonstration, and Handouts Education comprehension: verbalized understanding and returned demonstration  HOME EXERCISE PROGRAM: Access Code: [email or web address] URL: [email or web address] Date: 02/01/2022 Prepared by: Shelby Dubin Gregory Dowe  Exercises - Seated Hamstring Stretch  - 1 x daily - 7 x weekly - 1 sets - 2 reps - 20 sec hold - Seated Piriformis Stretch  - 1 x daily - 7 x weekly - 1 sets - 2 reps - 20 sec hold - Seated Long Arc Quad  - 1 x daily - 7 x weekly - 2 sets - 10 reps - Seated March  - 1 x daily - 7 x weekly - 2 sets - 10 reps - Seated Quad Set  - 1 x daily - 7 x weekly - 2 sets - 10  reps  ASSESSMENT:  CLINICAL IMPRESSION: Pt arrives to PT clinic with some reports of muscle soreness following aquatics appointment, but feels that it was secondary to weakness and using her muscles in a way that she has not yet.  Pt with some difficulty with seated exercises, so did not add weight at this time.  Pt requires min cuing to slow down.  Pt with reports of increased pain at end of 3 minute walk test, but states that it subsided quickly with returning to seated position.  Pt continues to require skilled PT to progress towards goal related activities.  OBJECTIVE IMPAIRMENTS: decreased balance, difficulty walking, decreased strength, impaired  flexibility, and pain.   ACTIVITY LIMITATIONS: lifting, bending, squatting, stairs, and transfers  PARTICIPATION LIMITATIONS: cleaning, community activity, and yard work  PERSONAL FACTORS: Past/current experiences and 3+ comorbidities: CVA, vertigo, OA  are also affecting patient's functional outcome.   REHAB POTENTIAL: Good  CLINICAL DECISION MAKING: Stable/uncomplicated  EVALUATION COMPLEXITY: Moderate   GOALS: Goals reviewed with patient? Yes  SHORT TERM GOALS: Target date: 02/22/2022  Pt will be independent with initial HEP. Baseline: Goal status: in progress  2.  Pt will decreased time with 5 times sit to/from stand to 15 sec or less to demonstrate improved functional strength. Baseline: 20.3 sec Goal status: INITIAL   LONG TERM GOALS: Target date: 03/29/2022   Pt will be independent with advanced HEP. Baseline:  Goal status: INITIAL  2.  Pt will increase FOTO to at least 56% to demonstrate improvements in functional mobility. Baseline: 40% Goal status: INITIAL  3.  Pt will increase bilateral hip and left knee strength to at least 4+ to 5/5 to allow her to navigate stairs in home with increased ease. Baseline:  Goal status: INITIAL  4.  Pt will report ability to walk for greater than 20 minutes without an increase  in knee pain to allow pt to return to walking with her family. Baseline:  Goal status: INITIAL  5.  Pt will deny any dizziness during typical functional tasks. Baseline:  Goal status: INITIAL   PLAN:  PT FREQUENCY: 2x/week  PT DURATION: 8 weeks  PLANNED INTERVENTIONS: Therapeutic exercises, Therapeutic activity, Neuromuscular re-education, Balance training, Gait training, Patient/Family education, Self Care, Joint mobilization, Joint manipulation, Stair training, Vestibular training, Canalith repositioning, Aquatic Therapy, Dry Needling, Electrical stimulation, Spinal manipulation, Spinal mobilization, Cryotherapy, Moist heat, Taping, Vasopneumatic device, Ultrasound, Ionotophoresis '4mg'$ /ml Dexamethasone, Manual therapy, and Re-evaluation  PLAN FOR NEXT SESSION: Remeasure 5 times sit to/from stand at next land session, aquatics, continue to increase LT quad activation and gradual strength.    Juel Burrow, PT 02/19/22 3:42 PM    [facility] [address] [place], [place] [address] Phone # [phone] Fax [phone]

## 2022-02-21 ENCOUNTER — Ambulatory Visit: Payer: Medicare Other | Admitting: Rehabilitative and Restorative Service Providers"

## 2022-02-25 ENCOUNTER — Ambulatory Visit: Payer: Medicare Other | Admitting: Rehabilitative and Restorative Service Providers"

## 2022-02-25 ENCOUNTER — Ambulatory Visit (INDEPENDENT_AMBULATORY_CARE_PROVIDER_SITE_OTHER): Payer: Medicare Other | Admitting: Internal Medicine

## 2022-02-25 ENCOUNTER — Encounter (INDEPENDENT_AMBULATORY_CARE_PROVIDER_SITE_OTHER): Payer: Self-pay | Admitting: Internal Medicine

## 2022-02-25 VITALS — BP 116/79 | HR 69 | Temp 97.9°F | Ht 64.0 in | Wt 177.0 lb

## 2022-02-25 DIAGNOSIS — E88819 Insulin resistance, unspecified: Secondary | ICD-10-CM | POA: Diagnosis not present

## 2022-02-25 DIAGNOSIS — Z6830 Body mass index (BMI) 30.0-30.9, adult: Secondary | ICD-10-CM

## 2022-02-25 DIAGNOSIS — E669 Obesity, unspecified: Secondary | ICD-10-CM | POA: Diagnosis not present

## 2022-02-27 ENCOUNTER — Ambulatory Visit: Payer: Medicare Other | Admitting: Rehabilitative and Restorative Service Providers"

## 2022-02-27 ENCOUNTER — Encounter: Payer: Self-pay | Admitting: Rehabilitative and Restorative Service Providers"

## 2022-02-27 DIAGNOSIS — G8929 Other chronic pain: Secondary | ICD-10-CM

## 2022-02-27 DIAGNOSIS — M6281 Muscle weakness (generalized): Secondary | ICD-10-CM

## 2022-02-27 DIAGNOSIS — R262 Difficulty in walking, not elsewhere classified: Secondary | ICD-10-CM

## 2022-02-27 DIAGNOSIS — M25562 Pain in left knee: Secondary | ICD-10-CM | POA: Diagnosis not present

## 2022-02-27 NOTE — Therapy (Signed)
OUTPATIENT PHYSICAL THERAPY TREATMENT NOTE   Patient Name: Kayla Rosales MRN: [id number] DOB:April 02, 1956, 66 y.o., female Today's Date: 02/27/2022   PT End of Session - 02/27/22 0935     Visit Number 5    Date for PT Re-Evaluation 03/29/22    Authorization Type Medicare/[employer]    Progress Note Due on Visit 10    PT Start Time 0930    PT Stop Time 1010    PT Time Calculation (min) 40 min    Activity Tolerance Patient tolerated treatment well    Behavior During Therapy [facility] for tasks assessed/performed               Past Medical History:  Diagnosis Date   Allergy    Arthritis    Asthma    Back pain    Benign positional vertigo    Bilateral swelling of feet    Bronchitis    Chicken pox    Colon polyps    Constipation    Cough 10/09/2020   COVID-19 10/09/2020   Diverticulitis    Early menopause occurring in patient age younger than 85 years    Fibromyalgia    GERD (gastroesophageal reflux disease)    Gluten intolerance    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 Still unclear what is cuasing her pain. May be nerve related. May benefit from starting on gabapentin. Start gabapentin 300 mg TID. F/u 4 weeks   Night sweats    Palpitations    Recurrent upper respiratory infection (URI)    Right arm weakness 09/28/2015   Last Assessment & Plan:  Formatting of this note might be different from the original. Referral to PT today. Suspect residual after stroke in left basal ganglia   Sleep apnea    Snoring 01/04/2019   Stroke (cerebrum) ([place]) 2017   right UE/neck sx, resolved.    Vertigo 08/11/2015   Last Assessment & Plan:  Formatting of this note might be different from the original. Concern given gradual onset over the last week and a half in  addition to neck pain. Patient needs neuroimaging to rule out dissecting vertebral aneurysm. Patient advised to go to emergency room. She declined ambulance transport   Vitamin B 12 deficiency    Vitamin D deficiency    Past Surgical History:  Procedure Laterality Date   CARPAL TUNNEL RELEASE     COLONOSCOPY  2015   [name]- [facility], [place]; 5 yr repeat recommended   COLONOSCOPY W/ POLYPECTOMY  05/26/2018   x5 polyps   KNEE ARTHROSCOPY Bilateral 2013   meniscus  x2   NECK SURGERY  2018   disc fusion   UPPER GASTROINTESTINAL ENDOSCOPY     Patient Active Problem List   Diagnosis Date Noted   Insulin resistance 02/04/2022   Other fatigue 01/21/2022   SOB (shortness of breath) 01/21/2022   Depression screening 01/21/2022   Class 1 obesity without serious comorbidity with body mass index (BMI) of 31.0 to 31.9 in adult 01/21/2022   Vitamin D deficiency 01/21/2022   Abnormal glucose 01/21/2022   Other hyperlipidemia 01/21/2022   Other tear of medial meniscus, current injury, left knee, subsequent encounter 01/14/2022   Chronic rhinitis 03/28/2021   Other adverse food reactions, not elsewhere  classified, subsequent encounter 06/27/9456   Diastolic dysfunction 59/29/2446   Heart murmur 02/16/2019   Hyperlipidemia LDL goal <100 02/16/2019   Class 1 obesity with serious comorbidity and body mass index (BMI) of 30.0 to 30.9 in adult 01/04/2019   Pelvic floor dysfunction in female 12/06/2018   Constipation due to outlet dysfunction 12/06/2018   Lactose intolerance 12/06/2018   Diverticulosis 05/27/2018   Osteopenia 01/19/2018   Irritable bowel syndrome with constipation 01/17/2018   History of colonic polyp 01/17/2018   History of stroke 01/15/2018   GERD (gastroesophageal reflux disease)    Asthma    S/P cervical spinal fusion 09/29/2017   Macrocytosis without anemia 11/25/2015   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   Heart palpitations 09/08/2015   Vitamin B12 deficiency  04/06/2014   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   Vestibular neuritis 10/15/2006   Family history of malignant neoplasm of breast 03/18/2006   Diaphragmatic hernia 03/18/2006   Premature menopause 03/18/2006    PCP: Ma Hillock, DO  REFERRING PROVIDER: Leandrew Koyanagi, MD   REFERRING DIAG: [id number] (ICD-10-CM) - Primary osteoarthritis of left knee   THERAPY DIAG:  Chronic pain of left knee  Difficulty in walking, not elsewhere classified  Muscle weakness (generalized)  Rationale for Evaluation and Treatment Rehabilitation  ONSET DATE: 01/07/2022  SUBJECTIVE:   SUBJECTIVE STATEMENT: Pt reports that she can tell that she is getting stronger and she had an easier time with stairs.  PERTINENT HISTORY: CVA, allergies, osteopenia, diverticulosis, Hx of cervical fusion, vestibular neuritis/BPPV, lumbar herniated disc, sleep apnea PAIN:  Are you having pain? Yes: NPRS scale: 3/10 Pain location: left knee Pain description: sharp Aggravating factors: movement Relieving factors: rest  PRECAUTIONS: None  WEIGHT BEARING RESTRICTIONS: No  FALLS:  Has patient fallen in last 6 months? No  LIVING ENVIRONMENT: Lives with: lives with their spouse Lives in: House/apartment Stairs: Yes: Internal: 15 steps; on left going up and External: 2 steps; none Has following equipment at home: None  OCCUPATION: Retired Education officer, museum  PLOF: Independent and Leisure: playing with grandchildren, sewing, gardening  PATIENT GOALS: To be able to walk and get some weight off and enjoy my grandchildren and go on hikes if I want to.  OBJECTIVE:   DIAGNOSTIC FINDINGS:  MRI of left knee on 01/24/22: IMPRESSION: 1. Severe attenuation of the body of the medial meniscus which may reflect prior meniscectomy versus maceration. Degeneration of the remainder of the anterior horn and posterior horns of the medial meniscus. Small oblique tear of the posterior horn of the medial meniscus  extending to the superior articular surface. 2. Complex signal in the posterior horn of the lateral meniscus concerning for a tear. 3. Cartilage fissuring of the patellar apex. Partial-thickness cartilage loss of the patellofemoral compartment most severe in the medial patellofemoral compartment. 4. Extensive full-thickness cartilage loss of the medial femorotibial compartment with subchondral reactive marrow edema in the medial tibial plateau.  PATIENT SURVEYS:  Eval:  FOTO 40% (projected 56% by visit 15)  COGNITION: Overall cognitive status: Within functional limits for tasks assessed     SENSATION: Reports some numbness/tingling down left LE  EDEMA:  Circumferential: right 43 cm, left 43.5 cm  MUSCLE LENGTH: Bilat hamstring tightness noted  POSTURE: rounded shoulders  PALPATION: Tenderness to palpation along right knee medial joint line  LOWER EXTREMITY ROM:  WFL  LOWER EXTREMITY MMT:  Eval:  Bilateral hips 4/5, Left quad/hamstring 4/5, right quad/hamstring 5/5  LOWER EXTREMITY SPECIAL TESTS:  Knee special tests: Anterior  drawer test: negative, Posterior drawer test: positive , and Patellafemoral grind test: negative  FUNCTIONAL TESTS:  Eval:   5 times sit to stand: 20.3 sec with pushing up from thighs  02/19/2022: 3 Minute Walk Test:  643 ft with pain going up to 7/10 towards end of ambulation  02/27/2022: 5 times sit to stand: 13.1 sec with pushing up from thighs  GAIT: Distance walked: >100 ft Assistive device utilized: None Level of assistance: Complete Independence Comments: Antalgic gait pattern   TODAY'S TREATMENT:  DATE: 02/27/2022 NuStep: Level 5x 6 minutes-PT present to discuss progress Sit to/from stand (seated on purple foam for increased chair height) 2x10 Seated hamstring stretch 2x20 seconds bilat Seated with 2#:  marching, LAQ with hold at TKE, heel/toe raises, hip adduction ball squeeze, hip abduction scissors.  2x10 bilat  each Seated hamstring curls with red tband 2x10 bilat Standing TKE with red tband x15 bilat   DATE: 02/19/2022 NuStep: Level 5x 6 minutes-PT present to discuss progress Sit to/from stand (seated on purple foam for increased chair height) 2x10 Seated:  marching, LAQ with hold at [facility], heel/toe raises, hip adduction ball squeeze, hip abduction scissors.  2x10 bilat each Seated hamstring curls with red tband 2x10 bilat Seated hamstring stretch 1x20 seconds bilat 3 minutes of ambulation in PT clinic for 643 ft   02/15/22: Pt arrives for aquatic physical therapy. Treatment took place in 3.5-5.5 feet of water. Water temperature was. Pt entered the pool via steps reciprocally but with caution and mild use of the rails. Pt requires buoyancy of water for support and to offload joints with strengthening exercises.   Seated water bench with 75% submersion Pt performed seated LE AROM exercises 20x in all planes, pain assessment concurrent.   Water walking 75% depth 6 lengths in all directions. No assistance, natural UE.  Education on how to stand without hyperextending her knee and contracting her quad more. Pt has difficulty making quad contraction.  Hip 3 ways 15x Bil in each directions with mild UE support. Single limb stance LT as RTLE performed blue noodle single leg press 10x    PATIENT EDUCATION:  Education details: Issued HEP Person educated: Patient Education method: Explanation, Demonstration, and Handouts Education comprehension: verbalized understanding and returned demonstration  HOME EXERCISE PROGRAM: Access Code: [email or web address] URL: [email or web address] Date: 02/01/2022 Prepared by: Shelby Dubin Kylei Purington  Exercises - Seated Hamstring Stretch  - 1 x daily - 7 x weekly - 1 sets - 2 reps - 20 sec hold - Seated Piriformis Stretch  - 1 x daily - 7 x weekly - 1 sets - 2 reps - 20 sec hold - Seated Long Arc Quad  - 1 x daily - 7 x weekly - 2 sets - 10 reps - Seated March  - 1 x  daily - 7 x weekly - 2 sets - 10 reps - Seated Quad Set  - 1 x daily - 7 x weekly - 2 sets - 10 reps  ASSESSMENT:  CLINICAL IMPRESSION: Ms Kutsch presents to skilled PT reporting that she is feeling stronger.  Pt was able to add in weights for seated exercises and has improved time with 5 times sit to/from stand assessment.  Pt is making anticipated progress towards goal related activities and continues to require skilled PT to further progress towards goals.  OBJECTIVE IMPAIRMENTS: decreased balance, difficulty walking, decreased strength, impaired flexibility, and pain.   ACTIVITY LIMITATIONS: lifting, bending, squatting, stairs, and transfers  PARTICIPATION LIMITATIONS: cleaning, community activity, and yard work  PERSONAL FACTORS: Past/current experiences and 3+ comorbidities: CVA, vertigo, OA  are also affecting patient's functional outcome.   REHAB POTENTIAL: Good  CLINICAL DECISION MAKING: Stable/uncomplicated  EVALUATION COMPLEXITY: Moderate   GOALS: Goals reviewed with patient? Yes  SHORT TERM GOALS: Target date: 02/22/2022  Pt will be independent with initial HEP. Baseline: Goal status: MET on 02/27/2022  2.  Pt will decreased time with 5 times sit to/from stand to 15 sec or less to demonstrate improved functional strength. Baseline: 20.3 sec Goal status: MET on 02/27/2022   LONG TERM GOALS: Target date: 03/29/2022   Pt will be independent with advanced HEP. Baseline:  Goal status: INITIAL  2.  Pt will increase FOTO to at least 56% to demonstrate improvements in functional mobility. Baseline: 40% Goal status: INITIAL  3.  Pt will increase bilateral hip and left knee strength to at least 4+ to 5/5 to allow her to navigate stairs in home with increased ease. Baseline:  Goal status: INITIAL  4.  Pt will report ability to walk for greater than 20 minutes without an increase in knee pain to allow pt to return to walking with her family. Baseline:  Goal status:  INITIAL  5.  Pt will deny any dizziness during typical functional tasks. Baseline:  Goal status: INITIAL   PLAN:  PT FREQUENCY: 2x/week  PT DURATION: 8 weeks  PLANNED INTERVENTIONS: Therapeutic exercises, Therapeutic activity, Neuromuscular re-education, Balance training, Gait training, Patient/Family education, Self Care, Joint mobilization, Joint manipulation, Stair training, Vestibular training, Canalith repositioning, Aquatic Therapy, Dry Needling, Electrical stimulation, Spinal manipulation, Spinal mobilization, Cryotherapy, Moist heat, Taping, Vasopneumatic device, Ultrasound, Ionotophoresis 62m/ml Dexamethasone, Manual therapy, and Re-evaluation  PLAN FOR NEXT SESSION: aquatics, continue to increase LT quad activation and gradual strength.    SJuel Burrow PT 02/27/22 10:15 AM    [facility] [address] [place] [address]Phone # [phone] [phone]

## 2022-02-28 ENCOUNTER — Ambulatory Visit: Payer: Medicare Other | Admitting: Rehabilitative and Restorative Service Providers"

## 2022-02-28 NOTE — Therapy (Signed)
OUTPATIENT PHYSICAL THERAPY TREATMENT NOTE   Patient Name: Kayla Rosales MRN: [id number] DOB:01/23/1956, 66 y.o., female Today's Date: 03/01/2022   PT End of Session - 03/01/22 1900     Visit Number 6    Date for PT Re-Evaluation 03/29/22    Authorization Type Medicare/[employer]    Progress Note Due on Visit 10    PT Start Time 1345    PT Stop Time 1430    PT Time Calculation (min) 45 min    Activity Tolerance Patient tolerated treatment well    Behavior During Therapy [facility] for tasks assessed/performed               Past Medical History:  Diagnosis Date   Allergy    Arthritis    Asthma    Back pain    Benign positional vertigo    Bilateral swelling of feet    Bronchitis    Chicken pox    Colon polyps    Constipation    Cough 10/09/2020   COVID-19 10/09/2020   Diverticulitis    Early menopause occurring in patient age younger than 33 years    Fibromyalgia    GERD (gastroesophageal reflux disease)    Gluten intolerance    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 Still unclear what is cuasing her pain. May be nerve related. May benefit from starting on gabapentin. Start gabapentin 300 mg TID. F/u 4 weeks   Night sweats    Palpitations    Recurrent upper respiratory infection (URI)    Right arm weakness 09/28/2015   Last Assessment & Plan:  Formatting of this note might be different from the original. Referral to PT today. Suspect residual after stroke in left basal ganglia   Sleep apnea    Snoring 01/04/2019   Stroke (cerebrum) ([place]) 2017   right UE/neck sx, resolved.    Vertigo 08/11/2015   Last Assessment & Plan:  Formatting of this note might be different from the original. Concern given gradual onset over the last week and a half in  addition to neck pain. Patient needs neuroimaging to rule out dissecting vertebral aneurysm. Patient advised to go to emergency room. She declined ambulance transport   Vitamin B 12 deficiency    Vitamin D deficiency    Past Surgical History:  Procedure Laterality Date   CARPAL TUNNEL RELEASE     COLONOSCOPY  2015   [name]- [facility], [place]; 5 yr repeat recommended   COLONOSCOPY W/ POLYPECTOMY  05/26/2018   x5 polyps   KNEE ARTHROSCOPY Bilateral 2013   meniscus  x2   NECK SURGERY  2018   disc fusion   UPPER GASTROINTESTINAL ENDOSCOPY     Patient Active Problem List   Diagnosis Date Noted   Insulin resistance 02/04/2022   Other fatigue 01/21/2022   SOB (shortness of breath) 01/21/2022   Depression screening 01/21/2022   Class 1 obesity without serious comorbidity with body mass index (BMI) of 31.0 to 31.9 in adult 01/21/2022   Vitamin D deficiency 01/21/2022   Abnormal glucose 01/21/2022   Other hyperlipidemia 01/21/2022   Other tear of medial meniscus, current injury, left knee, subsequent encounter 01/14/2022   Chronic rhinitis 03/28/2021   Other adverse food reactions, not elsewhere  classified, subsequent encounter 35/32/9924   Diastolic dysfunction 26/83/4196   Heart murmur 02/16/2019   Hyperlipidemia LDL goal <100 02/16/2019   Class 1 obesity with serious comorbidity and body mass index (BMI) of 30.0 to 30.9 in adult 01/04/2019   Pelvic floor dysfunction in female 12/06/2018   Constipation due to outlet dysfunction 12/06/2018   Lactose intolerance 12/06/2018   Diverticulosis 05/27/2018   Osteopenia 01/19/2018   Irritable bowel syndrome with constipation 01/17/2018   History of colonic polyp 01/17/2018   History of stroke 01/15/2018   GERD (gastroesophageal reflux disease)    Asthma    S/P cervical spinal fusion 09/29/2017   Macrocytosis without anemia 11/25/2015   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   Heart palpitations 09/08/2015   Vitamin B12 deficiency  04/06/2014   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   Vestibular neuritis 10/15/2006   Family history of malignant neoplasm of breast 03/18/2006   Diaphragmatic hernia 03/18/2006   Premature menopause 03/18/2006    PCP: Ma Hillock, DO  REFERRING PROVIDER: Leandrew Koyanagi, MD   REFERRING DIAG: [id number] (ICD-10-CM) - Primary osteoarthritis of left knee   THERAPY DIAG:  Chronic pain of left knee  Difficulty in walking, not elsewhere classified  Muscle weakness (generalized)  Rationale for Evaluation and Treatment Rehabilitation  ONSET DATE: 01/07/2022  SUBJECTIVE:   SUBJECTIVE STATEMENT: My quad is getting better. I do not do my exercises every day  PERTINENT HISTORY: CVA, allergies, osteopenia, diverticulosis, Hx of cervical fusion, vestibular neuritis/BPPV, lumbar herniated disc, sleep apnea PAIN:  Are you having pain? Yes: NPRS scale: 0/10 Pain location:   Pain description:   Aggravating factors: movement Relieving factors: rest  PRECAUTIONS: None  WEIGHT BEARING RESTRICTIONS: No  FALLS:  Has patient fallen in last 6 months? No  LIVING ENVIRONMENT: Lives with: lives with their spouse Lives in: House/apartment Stairs: Yes: Internal: 15 steps; on left going up and External: 2 steps; none Has following equipment at home: None  OCCUPATION: Retired Education officer, museum  PLOF: Independent and Leisure: playing with grandchildren, sewing, gardening  PATIENT GOALS: To be able to walk and get some weight off and enjoy my grandchildren and go on hikes if I want to.  OBJECTIVE:   DIAGNOSTIC FINDINGS:  MRI of left knee on 01/24/22: IMPRESSION: 1. Severe attenuation of the body of the medial meniscus which may reflect prior meniscectomy versus maceration. Degeneration of the remainder of the anterior horn and posterior horns of the medial meniscus. Small oblique tear of the posterior horn of the medial meniscus extending to the superior articular surface. 2.  Complex signal in the posterior horn of the lateral meniscus concerning for a tear. 3. Cartilage fissuring of the patellar apex. Partial-thickness cartilage loss of the patellofemoral compartment most severe in the medial patellofemoral compartment. 4. Extensive full-thickness cartilage loss of the medial femorotibial compartment with subchondral reactive marrow edema in the medial tibial plateau.  PATIENT SURVEYS:  Eval:  FOTO 40% (projected 56% by visit 15)  COGNITION: Overall cognitive status: Within functional limits for tasks assessed     SENSATION: Reports some numbness/tingling down left LE  EDEMA:  Circumferential: right 43 cm, left 43.5 cm  MUSCLE LENGTH: Bilat hamstring tightness noted  POSTURE: rounded shoulders  PALPATION: Tenderness to palpation along right knee medial joint line  LOWER EXTREMITY ROM:  WFL  LOWER EXTREMITY MMT:  Eval:  Bilateral hips 4/5, Left quad/hamstring 4/5, right quad/hamstring 5/5  LOWER EXTREMITY SPECIAL TESTS:  Knee special tests: Anterior drawer test: negative, Posterior drawer  test: positive , and Patellafemoral grind test: negative  FUNCTIONAL TESTS:  Eval:   5 times sit to stand: 20.3 sec with pushing up from thighs  02/19/2022: 3 Minute Walk Test:  643 ft with pain going up to 7/10 towards end of ambulation  02/27/2022: 5 times sit to stand: 13.1 sec with pushing up from thighs  GAIT: Distance walked: >100 ft Assistive device utilized: None Level of assistance: Complete Independence Comments: Antalgic gait pattern   TODAY'S TREATMENT:  03/01/22:Pt arrives for aquatic physical therapy. Treatment took place in 3.5-5.5 feet of water. Water temperature was 91 degrees F.. Pt entered the pool via stairs step to step with mild use of the rails. Pt requires buoyancy of water for support and to offload joints with strengthening exercises.  Seated water bench with 75% submersion Pt performed seated LE AROM exercises 20x in  all planes, concurrent review of current status. 75% depth water walking with small noodle 8 lengths each. Standing hip 3 ways 15x Bil with some UE on pool wall for balance. Mini squat against wall with lat/core press 2x15. Standing knee extension with blue noodle 15x  DATE: 02/27/2022 NuStep: Level 5x 6 minutes-PT present to discuss progress Sit to/from stand (seated on purple foam for increased chair height) 2x10 Seated hamstring stretch 2x20 seconds bilat Seated with 2#:  marching, LAQ with hold at TKE, heel/toe raises, hip adduction ball squeeze, hip abduction scissors.  2x10 bilat each Seated hamstring curls with red tband 2x10 bilat Standing TKE with red tband x15 bilat   DATE: 02/19/2022 NuStep: Level 5x 6 minutes-PT present to discuss progress Sit to/from stand (seated on purple foam for increased chair height) 2x10 Seated:  marching, LAQ with hold at [facility], heel/toe raises, hip adduction ball squeeze, hip abduction scissors.  2x10 bilat each Seated hamstring curls with red tband 2x10 bilat Seated hamstring stretch 1x20 seconds bilat 3 minutes of ambulation in PT clinic for 643 ft   02/15/22: Pt arrives for aquatic physical therapy. Treatment took place in 3.5-5.5 feet of water. Water temperature was. Pt entered the pool via steps reciprocally but with caution and mild use of the rails. Pt requires buoyancy of water for support and to offload joints with strengthening exercises.   Seated water bench with 75% submersion Pt performed seated LE AROM exercises 20x in all planes, pain assessment concurrent.   Water walking 75% depth 6 lengths in all directions. No assistance, natural UE.  Education on how to stand without hyperextending her knee and contracting her quad more. Pt has difficulty making quad contraction.  Hip 3 ways 15x Bil in each directions with mild UE support. Single limb stance LT as RTLE performed blue noodle single leg press 10x    PATIENT EDUCATION:  Education  details: Issued HEP Person educated: Patient Education method: Explanation, Demonstration, and Handouts Education comprehension: verbalized understanding and returned demonstration  HOME EXERCISE PROGRAM: Access Code: [email or web address] URL: [email or web address] Date: 02/01/2022 Prepared by: Shelby Dubin Menke  Exercises - Seated Hamstring Stretch  - 1 x daily - 7 x weekly - 1 sets - 2 reps - 20 sec hold - Seated Piriformis Stretch  - 1 x daily - 7 x weekly - 1 sets - 2 reps - 20 sec hold - Seated Long Arc Quad  - 1 x daily - 7 x weekly - 2 sets - 10 reps - Seated March  - 1 x daily - 7 x weekly - 2 sets - 10 reps - Seated Quad Set  -  1 x daily - 7 x weekly - 2 sets - 10 reps  ASSESSMENT:  CLINICAL IMPRESSION: Pt arrives to aquatic PT with no current knee reporting she is improving and can tell she goes up & down her stairs better with less pain. Edema around medial knee appears less. Pt able to perform water walking with speed, enough to generate nice current for extra resistance. Pt had no pain with any aquatic exercise.  OBJECTIVE IMPAIRMENTS: decreased balance, difficulty walking, decreased strength, impaired flexibility, and pain.   ACTIVITY LIMITATIONS: lifting, bending, squatting, stairs, and transfers  PARTICIPATION LIMITATIONS: cleaning, community activity, and yard work  PERSONAL FACTORS: Past/current experiences and 3+ comorbidities: CVA, vertigo, OA  are also affecting patient's functional outcome.   REHAB POTENTIAL: Good  CLINICAL DECISION MAKING: Stable/uncomplicated  EVALUATION COMPLEXITY: Moderate   GOALS: Goals reviewed with patient? Yes  SHORT TERM GOALS: Target date: 02/22/2022  Pt will be independent with initial HEP. Baseline: Goal status: MET on 02/27/2022  2.  Pt will decreased time with 5 times sit to/from stand to 15 sec or less to demonstrate improved functional strength. Baseline: 20.3 sec Goal status: MET on 02/27/2022   LONG TERM GOALS:  Target date: 03/29/2022   Pt will be independent with advanced HEP. Baseline:  Goal status: INITIAL  2.  Pt will increase FOTO to at least 56% to demonstrate improvements in functional mobility. Baseline: 40% Goal status: INITIAL  3.  Pt will increase bilateral hip and left knee strength to at least 4+ to 5/5 to allow her to navigate stairs in home with increased ease. Baseline:  Goal status: INITIAL  4.  Pt will report ability to walk for greater than 20 minutes without an increase in knee pain to allow pt to return to walking with her family. Baseline:  Goal status: INITIAL  5.  Pt will deny any dizziness during typical functional tasks. Baseline:  Goal status: INITIAL   PLAN:  PT FREQUENCY: 2x/week  PT DURATION: 8 weeks  PLANNED INTERVENTIONS: Therapeutic exercises, Therapeutic activity, Neuromuscular re-education, Balance training, Gait training, Patient/Family education, Self Care, Joint mobilization, Joint manipulation, Stair training, Vestibular training, Canalith repositioning, Aquatic Therapy, Dry Needling, Electrical stimulation, Spinal manipulation, Spinal mobilization, Cryotherapy, Moist heat, Taping, Vasopneumatic device, Ultrasound, Ionotophoresis 79m/ml Dexamethasone, Manual therapy, and Re-evaluation  PLAN FOR NEXT SESSION: aquatics, continue to increase LT quad activation and gradual strength.    JMyrene Galas PTA 03/01/22 7:08 PM   [facility] [address] [place] [address]Phone # [phone] [phone]

## 2022-03-01 ENCOUNTER — Encounter: Payer: Self-pay | Admitting: Physical Therapy

## 2022-03-01 ENCOUNTER — Ambulatory Visit: Payer: Medicare Other | Admitting: Physical Therapy

## 2022-03-01 DIAGNOSIS — R262 Difficulty in walking, not elsewhere classified: Secondary | ICD-10-CM

## 2022-03-01 DIAGNOSIS — M25562 Pain in left knee: Secondary | ICD-10-CM | POA: Diagnosis not present

## 2022-03-01 DIAGNOSIS — M6281 Muscle weakness (generalized): Secondary | ICD-10-CM

## 2022-03-01 DIAGNOSIS — G8929 Other chronic pain: Secondary | ICD-10-CM

## 2022-03-05 ENCOUNTER — Ambulatory Visit: Payer: Medicare Other | Admitting: Rehabilitative and Restorative Service Providers"

## 2022-03-05 ENCOUNTER — Encounter: Payer: Self-pay | Admitting: Rehabilitative and Restorative Service Providers"

## 2022-03-05 DIAGNOSIS — M6281 Muscle weakness (generalized): Secondary | ICD-10-CM

## 2022-03-05 DIAGNOSIS — M25562 Pain in left knee: Secondary | ICD-10-CM | POA: Diagnosis not present

## 2022-03-05 DIAGNOSIS — R262 Difficulty in walking, not elsewhere classified: Secondary | ICD-10-CM

## 2022-03-05 DIAGNOSIS — G8929 Other chronic pain: Secondary | ICD-10-CM

## 2022-03-05 NOTE — Therapy (Signed)
OUTPATIENT PHYSICAL THERAPY TREATMENT NOTE   Patient Name: Kayla Rosales MRN: [id number] DOB:April 20, 1955, 66 y.o., female Today's Date: 03/05/2022   PT End of Session - 03/05/22 0936     Visit Number 7    Date for PT Re-Evaluation 03/29/22    Authorization Type Medicare/[employer]    Progress Note Due on Visit 10    PT Start Time 0935    PT Stop Time 1013    PT Time Calculation (min) 38 min    Activity Tolerance Patient tolerated treatment well    Behavior During Therapy [facility] for tasks assessed/performed               Past Medical History:  Diagnosis Date   Allergy    Arthritis    Asthma    Back pain    Benign positional vertigo    Bilateral swelling of feet    Bronchitis    Chicken pox    Colon polyps    Constipation    Cough 10/09/2020   COVID-19 10/09/2020   Diverticulitis    Early menopause occurring in patient age younger than 92 years    Fibromyalgia    GERD (gastroesophageal reflux disease)    Gluten intolerance    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 Still unclear what is cuasing her pain. May be nerve related. May benefit from starting on gabapentin. Start gabapentin 300 mg TID. F/u 4 weeks   Night sweats    Palpitations    Recurrent upper respiratory infection (URI)    Right arm weakness 09/28/2015   Last Assessment & Plan:  Formatting of this note might be different from the original. Referral to PT today. Suspect residual after stroke in left basal ganglia   Sleep apnea    Snoring 01/04/2019   Stroke (cerebrum) ([place]) 2017   right UE/neck sx, resolved.    Vertigo 08/11/2015   Last Assessment & Plan:  Formatting of this note might be different from the original. Concern given gradual onset over the last week and a half in  addition to neck pain. Patient needs neuroimaging to rule out dissecting vertebral aneurysm. Patient advised to go to emergency room. She declined ambulance transport   Vitamin B 12 deficiency    Vitamin D deficiency    Past Surgical History:  Procedure Laterality Date   CARPAL TUNNEL RELEASE     COLONOSCOPY  2015   [name]- [facility], [place]; 5 yr repeat recommended   COLONOSCOPY W/ POLYPECTOMY  05/26/2018   x5 polyps   KNEE ARTHROSCOPY Bilateral 2013   meniscus  x2   NECK SURGERY  2018   disc fusion   UPPER GASTROINTESTINAL ENDOSCOPY     Patient Active Problem List   Diagnosis Date Noted   Insulin resistance 02/04/2022   Other fatigue 01/21/2022   SOB (shortness of breath) 01/21/2022   Depression screening 01/21/2022   Class 1 obesity without serious comorbidity with body mass index (BMI) of 31.0 to 31.9 in adult 01/21/2022   Vitamin D deficiency 01/21/2022   Abnormal glucose 01/21/2022   Other hyperlipidemia 01/21/2022   Other tear of medial meniscus, current injury, left knee, subsequent encounter 01/14/2022   Chronic rhinitis 03/28/2021   Other adverse food reactions, not elsewhere  classified, subsequent encounter 94/76/5465   Diastolic dysfunction 03/54/6568   Heart murmur 02/16/2019   Hyperlipidemia LDL goal <100 02/16/2019   Class 1 obesity with serious comorbidity and body mass index (BMI) of 30.0 to 30.9 in adult 01/04/2019   Pelvic floor dysfunction in female 12/06/2018   Constipation due to outlet dysfunction 12/06/2018   Lactose intolerance 12/06/2018   Diverticulosis 05/27/2018   Osteopenia 01/19/2018   Irritable bowel syndrome with constipation 01/17/2018   History of colonic polyp 01/17/2018   History of stroke 01/15/2018   GERD (gastroesophageal reflux disease)    Asthma    S/P cervical spinal fusion 09/29/2017   Macrocytosis without anemia 11/25/2015   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   Heart palpitations 09/08/2015   Vitamin B12 deficiency  04/06/2014   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   Vestibular neuritis 10/15/2006   Family history of malignant neoplasm of breast 03/18/2006   Diaphragmatic hernia 03/18/2006   Premature menopause 03/18/2006    PCP: Ma Hillock, DO  REFERRING PROVIDER: Leandrew Koyanagi, MD   REFERRING DIAG: [id number] (ICD-10-CM) - Primary osteoarthritis of left knee   THERAPY DIAG:  Chronic pain of left knee  Difficulty in walking, not elsewhere classified  Muscle weakness (generalized)  Rationale for Evaluation and Treatment Rehabilitation  ONSET DATE: 01/07/2022  SUBJECTIVE:   SUBJECTIVE STATEMENT: Pt reports some soreness following aquatic PT, but states that it was a good work-out  PERTINENT HISTORY: CVA, allergies, osteopenia, diverticulosis, Hx of cervical fusion, vestibular neuritis/BPPV, lumbar herniated disc, sleep apnea PAIN:  Are you having pain? Yes: NPRS scale: 4-6/10 Pain location: left knee Pain description: sharp Aggravating factors: movement Relieving factors: rest  PRECAUTIONS: None  WEIGHT BEARING RESTRICTIONS: No  FALLS:  Has patient fallen in last 6 months? No  LIVING ENVIRONMENT: Lives with: lives with their spouse Lives in: House/apartment Stairs: Yes: Internal: 15 steps; on left going up and External: 2 steps; none Has following equipment at home: None  OCCUPATION: Retired Education officer, museum  PLOF: Independent and Leisure: playing with grandchildren, sewing, gardening  PATIENT GOALS: To be able to walk and get some weight off and enjoy my grandchildren and go on hikes if I want to.  OBJECTIVE:   DIAGNOSTIC FINDINGS:  MRI of left knee on 01/24/22: IMPRESSION: 1. Severe attenuation of the body of the medial meniscus which may reflect prior meniscectomy versus maceration. Degeneration of the remainder of the anterior horn and posterior horns of the medial meniscus. Small oblique tear of the posterior horn of the medial meniscus extending to the  superior articular surface. 2. Complex signal in the posterior horn of the lateral meniscus concerning for a tear. 3. Cartilage fissuring of the patellar apex. Partial-thickness cartilage loss of the patellofemoral compartment most severe in the medial patellofemoral compartment. 4. Extensive full-thickness cartilage loss of the medial femorotibial compartment with subchondral reactive marrow edema in the medial tibial plateau.  PATIENT SURVEYS:  Eval:  FOTO 40% (projected 56% by visit 15)  COGNITION: Overall cognitive status: Within functional limits for tasks assessed     SENSATION: Reports some numbness/tingling down left LE  EDEMA:  Circumferential: right 43 cm, left 43.5 cm  MUSCLE LENGTH: Bilat hamstring tightness noted  POSTURE: rounded shoulders  PALPATION: Tenderness to palpation along right knee medial joint line  LOWER EXTREMITY ROM:  WFL  LOWER EXTREMITY MMT:  Eval:  Bilateral hips 4/5, Left quad/hamstring 4/5, right quad/hamstring 5/5  LOWER EXTREMITY SPECIAL TESTS:  Knee special tests: Anterior drawer test: negative, Posterior  drawer test: positive , and Patellafemoral grind test: negative  FUNCTIONAL TESTS:  Eval:   5 times sit to stand: 20.3 sec with pushing up from thighs  02/19/2022: 3 Minute Walk Test:  643 ft with pain going up to 7/10 towards end of ambulation  02/27/2022: 5 times sit to stand: 13.1 sec with pushing up from thighs  GAIT: Distance walked: >100 ft Assistive device utilized: None Level of assistance: Complete Independence Comments: Antalgic gait pattern   TODAY'S TREATMENT:  DATE: 03/05/2022 Seated with 2#:  marching, LAQ with hold at [facility], heel/toe raises, hip adduction ball squeeze, hip abduction scissors.  2x10 bilat each Seated hip abduction and hamstring curls with red tband 2x10 bilat Sit to/from stand (seated on purple foam for increased chair height) 2x10 Seated hamstring stretch 2x20 seconds bilat NuStep: Level  5x 6 minutes-PT present to discuss progress Standing hip abduction and hip extension 2x10 bilat   03/01/22:Pt arrives for aquatic physical therapy. Treatment took place in 3.5-5.5 feet of water. Water temperature was 91 degrees F.. Pt entered the pool via stairs step to step with mild use of the rails. Pt requires buoyancy of water for support and to offload joints with strengthening exercises.  Seated water bench with 75% submersion Pt performed seated LE AROM exercises 20x in all planes, concurrent review of current status. 75% depth water walking with small noodle 8 lengths each. Standing hip 3 ways 15x Bil with some UE on pool wall for balance. Mini squat against wall with lat/core press 2x15. Standing knee extension with blue noodle 15x  DATE: 02/27/2022 NuStep: Level 5x 6 minutes-PT present to discuss progress Sit to/from stand (seated on purple foam for increased chair height) 2x10 Seated hamstring stretch 2x20 seconds bilat Seated with 2#:  marching, LAQ with hold at TKE, heel/toe raises, hip adduction ball squeeze, hip abduction scissors.  2x10 bilat each Seated hamstring curls with red tband 2x10 bilat Standing TKE with red tband x15 bilat    PATIENT EDUCATION:  Education details: Issued HEP Person educated: Patient Education method: Explanation, Demonstration, and Handouts Education comprehension: verbalized understanding and returned demonstration  HOME EXERCISE PROGRAM: Access Code: [email or web address] URL: [email or web address] Date: 02/01/2022 Prepared by: Shelby Dubin Telvin Reinders  Exercises - Seated Hamstring Stretch  - 1 x daily - 7 x weekly - 1 sets - 2 reps - 20 sec hold - Seated Piriformis Stretch  - 1 x daily - 7 x weekly - 1 sets - 2 reps - 20 sec hold - Seated Long Arc Quad  - 1 x daily - 7 x weekly - 2 sets - 10 reps - Seated March  - 1 x daily - 7 x weekly - 2 sets - 10 reps - Seated Quad Set  - 1 x daily - 7 x weekly - 2 sets - 10 reps  ASSESSMENT:  CLINICAL  IMPRESSION: Beverlee presents to skilled PT reporting that she had some knee pain this morning, but can tell that she is getting stronger.  Pt able to progress with improved strengthening today.  Pt continues to progress with improved quad strengthening.  Pt would benefit from continued skilled PT to progress towards goal related activities.  OBJECTIVE IMPAIRMENTS: decreased balance, difficulty walking, decreased strength, impaired flexibility, and pain.   ACTIVITY LIMITATIONS: lifting, bending, squatting, stairs, and transfers  PARTICIPATION LIMITATIONS: cleaning, community activity, and yard work  PERSONAL FACTORS: Past/current experiences and 3+ comorbidities: CVA, vertigo, OA  are also affecting patient's functional outcome.   REHAB POTENTIAL: Good  CLINICAL  DECISION MAKING: Stable/uncomplicated  EVALUATION COMPLEXITY: Moderate   GOALS: Goals reviewed with patient? Yes  SHORT TERM GOALS: Target date: 02/22/2022  Pt will be independent with initial HEP. Baseline: Goal status: MET on 02/27/2022  2.  Pt will decreased time with 5 times sit to/from stand to 15 sec or less to demonstrate improved functional strength. Baseline: 20.3 sec Goal status: MET on 02/27/2022   LONG TERM GOALS: Target date: 03/29/2022   Pt will be independent with advanced HEP. Baseline:  Goal status: IN PROGRESS  2.  Pt will increase FOTO to at least 56% to demonstrate improvements in functional mobility. Baseline: 40% Goal status: INITIAL  3.  Pt will increase bilateral hip and left knee strength to at least 4+ to 5/5 to allow her to navigate stairs in home with increased ease. Baseline:  Goal status: INITIAL  4.  Pt will report ability to walk for greater than 20 minutes without an increase in knee pain to allow pt to return to walking with her family. Baseline:  Goal status: INITIAL  5.  Pt will deny any dizziness during typical functional tasks. Baseline:  Goal status:  INITIAL   PLAN:  PT FREQUENCY: 2x/week  PT DURATION: 8 weeks  PLANNED INTERVENTIONS: Therapeutic exercises, Therapeutic activity, Neuromuscular re-education, Balance training, Gait training, Patient/Family education, Self Care, Joint mobilization, Joint manipulation, Stair training, Vestibular training, Canalith repositioning, Aquatic Therapy, Dry Needling, Electrical stimulation, Spinal manipulation, Spinal mobilization, Cryotherapy, Moist heat, Taping, Vasopneumatic device, Ultrasound, Ionotophoresis 48m/ml Dexamethasone, Manual therapy, and Re-evaluation  PLAN FOR NEXT SESSION: aquatics, continue to increase LT quad activation and gradual strength.    SJuel Burrow PT 03/05/22 10:29 AM   [facility] [address] [place] [address]Phone # [phone] [phone]

## 2022-03-12 ENCOUNTER — Ambulatory Visit: Payer: Medicare Other | Admitting: Rehabilitative and Restorative Service Providers"

## 2022-03-13 ENCOUNTER — Ambulatory Visit: Payer: Medicare Other | Admitting: Physical Therapy

## 2022-03-13 NOTE — Progress Notes (Signed)
Chief Complaint:   OBESITY Kayla Rosales is here to discuss her progress with her obesity treatment plan along with follow-up of her obesity related diagnoses. Kayla Rosales is on the Category 2 Plan and states she is following her eating plan approximately 25% of the time. Kayla Rosales states she is doing 0 minutes 0 times per week.  Today's visit was #: 3 Starting weight: 181 lbs Starting date: 01/21/2022 Today's weight: 177 lbs Today's date: 02/25/2022 Total lbs lost to date: 4 Total lbs lost since last in-office visit: 4  Interim History: Kayla Rosales reports recent travel affecting implementation of the plan.  She also reports snacking when she is stressed or bored, and sometimes she has problems with portion control.  She did not journal due to concerns with results.  She remains committed to lose weight, but she is struggling with making changes.   Subjective:   1. Insulin resistance This may be contributing to increased hunger signals and carbohydrate cravings.  We reviewed benefits of metformin for appetite suppression, incretin effect and weight loss maintenance.  She declined pharmacotherapy at present.  Assessment/Plan:   1. Insulin resistance Kayla Rosales will continue with her weight loss therapy.  She will work on eliminating or reducing simple sugars.  2. Obesity with current BMI 30.5 Kayla Rosales is currently in the action stage of change. As such, her goal is to continue with weight loss efforts. She has agreed to the Category 2 Plan.   Exercise goals: No exercise has been prescribed at this time.  Behavioral modification strategies: increasing lean protein intake, decreasing simple carbohydrates, meal planning and cooking strategies, keeping healthy foods in the home, ways to avoid night time snacking, better snacking choices, and decreasing junk food.  Kayla Rosales has agreed to follow-up with our clinic in 2 to 3 weeks. She was informed of the importance of frequent follow-up visits to maximize her  success with intensive lifestyle modifications for her multiple health conditions.   Objective:   Blood pressure 116/79, pulse 69, temperature 97.9 F (36.6 C), height '5\' 4"'$  (1.626 m), weight 177 lb (80.3 kg), SpO2 97 %. Body mass index is 30.38 kg/m.  General: Cooperative, alert, well developed, in no acute distress. HEENT: Conjunctivae and lids unremarkable. Cardiovascular: Regular rhythm.  Lungs: Normal work of breathing. Neurologic: No focal deficits.   Lab Results  Component Value Date   CREATININE 0.75 09/12/2021   BUN 14 09/12/2021   NA 140 09/12/2021   K 4.7 09/12/2021   CL 105 09/12/2021   CO2 27 09/12/2021   Lab Results  Component Value Date   ALT 21 12/27/2020   AST 19 12/27/2020   ALKPHOS 75 12/27/2020   BILITOT 0.9 12/27/2020   Lab Results  Component Value Date   HGBA1C 5.3 09/12/2021   HGBA1C 5.4 03/19/2021   HGBA1C 5.1 12/09/2019   HGBA1C 5.4 08/17/2019   HGBA1C 5.3 12/17/2018   Lab Results  Component Value Date   INSULIN 12.9 01/21/2022   INSULIN 12.7 12/09/2019   INSULIN 20.0 08/17/2019   Lab Results  Component Value Date   TSH 1.290 01/21/2022   Lab Results  Component Value Date   CHOL 132 01/21/2022   HDL 45 01/21/2022   LDLCALC 65 01/21/2022   TRIG 121 01/21/2022   CHOLHDL 3 03/19/2021   Lab Results  Component Value Date   VD25OH 31.53 03/19/2021   VD25OH 54.9 12/09/2019   VD25OH 42.4 08/17/2019   Lab Results  Component Value Date   WBC 5.0 12/27/2020  HGB 14.5 12/27/2020   HCT 41.7 12/27/2020   MCV 99.5 12/27/2020   PLT 225.0 12/27/2020   Lab Results  Component Value Date   IRON 73 06/15/2019   TIBC 263 06/15/2019   FERRITIN 166 (H) 06/15/2019   Attestation Statements:   Reviewed by clinician on day of visit: allergies, medications, problem list, medical history, surgical history, family history, social history, and previous encounter notes.  Time spent on visit including pre-visit chart review and post-visit care  and charting was 20 minutes.   Wilhemena Durie, am acting as transcriptionist for Thomes Dinning, MD.  I have reviewed the above documentation for accuracy and completeness, and I agree with the above. -Thomes Dinning, MD

## 2022-03-18 ENCOUNTER — Encounter (INDEPENDENT_AMBULATORY_CARE_PROVIDER_SITE_OTHER): Payer: Self-pay | Admitting: Internal Medicine

## 2022-03-18 ENCOUNTER — Ambulatory Visit (INDEPENDENT_AMBULATORY_CARE_PROVIDER_SITE_OTHER): Payer: Medicare Other | Admitting: Internal Medicine

## 2022-03-18 VITALS — BP 123/72 | HR 71 | Temp 98.1°F | Ht 64.0 in | Wt 176.0 lb

## 2022-03-18 DIAGNOSIS — Z6830 Body mass index (BMI) 30.0-30.9, adult: Secondary | ICD-10-CM | POA: Diagnosis not present

## 2022-03-18 DIAGNOSIS — R638 Other symptoms and signs concerning food and fluid intake: Secondary | ICD-10-CM | POA: Diagnosis not present

## 2022-03-18 DIAGNOSIS — E669 Obesity, unspecified: Secondary | ICD-10-CM | POA: Diagnosis not present

## 2022-03-18 MED ORDER — METFORMIN HCL ER 500 MG PO TB24: 500.0000 mg | ORAL_TABLET | Freq: Every day | ORAL | 0 refills | Status: DC

## 2022-03-19 ENCOUNTER — Ambulatory Visit: Payer: Medicare Other | Admitting: Rehabilitative and Restorative Service Providers"

## 2022-03-19 NOTE — Therapy (Signed)
OUTPATIENT PHYSICAL THERAPY TREATMENT NOTE   Patient Name: Kayla Rosales MRN: [id number] DOB:Nov 21, 1955, 66 y.o., female Today's Date: 03/20/2022   PT End of Session - 03/20/22 1022     Visit Number 8    Date for PT Re-Evaluation 03/29/22    Authorization Type Medicare/[employer]    Progress Note Due on Visit 10    PT Start Time 0840    PT Stop Time 0925    PT Time Calculation (min) 45 min    Activity Tolerance Patient tolerated treatment well    Behavior During Therapy [facility] for tasks assessed/performed               Past Medical History:  Diagnosis Date   Allergy    Arthritis    Asthma    Back pain    Benign positional vertigo    Bilateral swelling of feet    Bronchitis    Chicken pox    Colon polyps    Constipation    Cough 10/09/2020   COVID-19 10/09/2020   Diverticulitis    Early menopause occurring in patient age younger than 75 years    Fibromyalgia    GERD (gastroesophageal reflux disease)    Gluten intolerance    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 Still unclear what is cuasing her pain. May be nerve related. May benefit from starting on gabapentin. Start gabapentin 300 mg TID. F/u 4 weeks   Night sweats    Palpitations    Recurrent upper respiratory infection (URI)    Right arm weakness 09/28/2015   Last Assessment & Plan:  Formatting of this note might be different from the original. Referral to PT today. Suspect residual after stroke in left basal ganglia   Sleep apnea    Snoring 01/04/2019   Stroke (cerebrum) ([place]) 2017   right UE/neck sx, resolved.    Vertigo 08/11/2015   Last Assessment & Plan:  Formatting of this note might be different from the original. Concern given gradual onset over the last week and a half in  addition to neck pain. Patient needs neuroimaging to rule out dissecting vertebral aneurysm. Patient advised to go to emergency room. She declined ambulance transport   Vitamin B 12 deficiency    Vitamin D deficiency    Past Surgical History:  Procedure Laterality Date   CARPAL TUNNEL RELEASE     COLONOSCOPY  2015   [name]- [facility], [place]; 5 yr repeat recommended   COLONOSCOPY W/ POLYPECTOMY  05/26/2018   x5 polyps   KNEE ARTHROSCOPY Bilateral 2013   meniscus  x2   NECK SURGERY  2018   disc fusion   UPPER GASTROINTESTINAL ENDOSCOPY     Patient Active Problem List   Diagnosis Date Noted   Insulin resistance 02/04/2022   Other fatigue 01/21/2022   SOB (shortness of breath) 01/21/2022   Depression screening 01/21/2022   Class 1 obesity without serious comorbidity with body mass index (BMI) of 31.0 to 31.9 in adult 01/21/2022   Vitamin D deficiency 01/21/2022   Abnormal glucose 01/21/2022   Other hyperlipidemia 01/21/2022   Other tear of medial meniscus, current injury, left knee, subsequent encounter 01/14/2022   Chronic rhinitis 03/28/2021   Other adverse food reactions, not elsewhere  classified, subsequent encounter 82/99/3716   Diastolic dysfunction 96/78/9381   Heart murmur 02/16/2019   Hyperlipidemia LDL goal <100 02/16/2019   Class 1 obesity with serious comorbidity and body mass index (BMI) of 30.0 to 30.9 in adult 01/04/2019   Pelvic floor dysfunction in female 12/06/2018   Constipation due to outlet dysfunction 12/06/2018   Lactose intolerance 12/06/2018   Diverticulosis 05/27/2018   Osteopenia 01/19/2018   Irritable bowel syndrome with constipation 01/17/2018   History of colonic polyp 01/17/2018   History of stroke 01/15/2018   GERD (gastroesophageal reflux disease)    Asthma    S/P cervical spinal fusion 09/29/2017   Macrocytosis without anemia 11/25/2015   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   Heart palpitations 09/08/2015   Vitamin B12 deficiency  04/06/2014   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   Vestibular neuritis 10/15/2006   Family history of malignant neoplasm of breast 03/18/2006   Diaphragmatic hernia 03/18/2006   Premature menopause 03/18/2006    PCP: Ma Hillock, DO  REFERRING PROVIDER: Leandrew Koyanagi, MD   REFERRING DIAG: [phone] (ICD-10-CM) - Primary osteoarthritis of left knee   THERAPY DIAG:  Chronic pain of left knee  Difficulty in walking, not elsewhere classified  Muscle weakness (generalized)  Rationale for Evaluation and Treatment Rehabilitation  ONSET DATE: 01/07/2022  SUBJECTIVE:   SUBJECTIVE STATEMENT: I went to the beach, walked on sand and walked longer distances mainly because I was feeling better. This increased my pain and swelling almost back to where I was in the beginning. Yesterday it started to finally feel better and swelling is beginning to go down.  PERTINENT HISTORY: CVA, allergies, osteopenia, diverticulosis, Hx of cervical fusion, vestibular neuritis/BPPV, lumbar herniated disc, sleep apnea PAIN:  Are you having pain? Yes: NPRS scale: 4-6/10 Pain location: left knee Pain description: sharp Aggravating factors: movement Relieving factors: rest  PRECAUTIONS: None  WEIGHT BEARING RESTRICTIONS: No  FALLS:  Has patient fallen in last 6 months? No  LIVING ENVIRONMENT: Lives with: lives with their spouse Lives in: House/apartment Stairs: Yes: Internal: 15 steps; on left going up and External: 2 steps; none Has following equipment at home: None  OCCUPATION: Retired Education officer, museum  PLOF: Independent and Leisure: playing with grandchildren, sewing, gardening  PATIENT GOALS: To be able to walk and get some weight off and enjoy my grandchildren and go on hikes if I want to.  OBJECTIVE:   DIAGNOSTIC FINDINGS:  MRI of left knee on 01/24/22: IMPRESSION: 1. Severe attenuation of the body of the medial meniscus which may reflect prior meniscectomy versus maceration.  Degeneration of the remainder of the anterior horn and posterior horns of the medial meniscus. Small oblique tear of the posterior horn of the medial meniscus extending to the superior articular surface. 2. Complex signal in the posterior horn of the lateral meniscus concerning for a tear. 3. Cartilage fissuring of the patellar apex. Partial-thickness cartilage loss of the patellofemoral compartment most severe in the medial patellofemoral compartment. 4. Extensive full-thickness cartilage loss of the medial femorotibial compartment with subchondral reactive marrow edema in the medial tibial plateau.  PATIENT SURVEYS:  Eval:  FOTO 40% (projected 56% by visit 15)  COGNITION: Overall cognitive status: Within functional limits for tasks assessed     SENSATION: Reports some numbness/tingling down left LE  EDEMA:  Circumferential: right 43 cm, left 43.5 cm  MUSCLE LENGTH: Bilat hamstring tightness noted  POSTURE: rounded shoulders  PALPATION: Tenderness to palpation along right knee medial joint line  LOWER EXTREMITY ROM:  WFL  LOWER EXTREMITY MMT:  Eval:  Bilateral hips 4/5, Left quad/hamstring 4/5, right quad/hamstring 5/5  LOWER EXTREMITY SPECIAL TESTS:  Knee special tests: Anterior drawer test: negative, Posterior drawer test: positive , and Patellafemoral grind test: negative  FUNCTIONAL TESTS:  Eval:   5 times sit to stand: 20.3 sec with pushing up from thighs  02/19/2022: 3 Minute Walk Test:  643 ft with pain going up to 7/10 towards end of ambulation  02/27/2022: 5 times sit to stand: 13.1 sec with pushing up from thighs  GAIT: Distance walked: >100 ft Assistive device utilized: None Level of assistance: Complete Independence Comments: Antalgic gait pattern   TODAY'S TREATMENT:  03/20/22:Pt arrives for aquatic physical therapy. Treatment took place in 3.5-5.5 feet of water. Water temperature was 91 degrees F.. Pt entered the pool via stairs step to step  with moderate use of the rails. Pt requires buoyancy of water for support and to offload joints with strengthening exercises.  Seated water bench with 75% submersion and discussion of current status. Pt performed seated LE AROM exercises 20x in all planes. 75% depth water walking with hand help single buoy wts 6 lengths each with short rest break to analyze knee before changing direction. Standing hip flex/ext then abduction 10x Bil with some UE on pool wall for balance added ankle fins for resistance today. Single leg stance 10 sec no UE static 1x, added UE movements 10x sagittal plane together then reciprocally Bil. Yellow noodle LE decompression x 3 min at end of session.   DATE: 03/05/2022 Seated with 2#:  marching, LAQ with hold at [facility], heel/toe raises, hip adduction ball squeeze, hip abduction scissors.  2x10 bilat each Seated hip abduction and hamstring curls with red tband 2x10 bilat Sit to/from stand (seated on purple foam for increased chair height) 2x10 Seated hamstring stretch 2x20 seconds bilat NuStep: Level 5x 6 minutes-PT present to discuss progress Standing hip abduction and hip extension 2x10 bilat   03/01/22:Pt arrives for aquatic physical therapy. Treatment took place in 3.5-5.5 feet of water. Water temperature was 91 degrees F.. Pt entered the pool via stairs step to step with mild use of the rails. Pt requires buoyancy of water for support and to offload joints with strengthening exercises.  Seated water bench with 75% submersion Pt performed seated LE AROM exercises 20x in all planes, concurrent review of current status. 75% depth water walking with small noodle 8 lengths each. Standing hip 3 ways 15x Bil with some UE on pool wall for balance. Mini squat against wall with lat/core press 2x15. Standing knee extension with blue noodle 15x    PATIENT EDUCATION:  Education details: Issued HEP Person educated: Patient Education method: Explanation, Demonstration, and  Handouts Education comprehension: verbalized understanding and returned demonstration  HOME EXERCISE PROGRAM: Access Code: [email or web address] URL: [email or web address] Date: 02/01/2022 Prepared by: Shelby Dubin Menke  Exercises - Seated Hamstring Stretch  - 1 x daily - 7 x weekly - 1 sets - 2 reps - 20 sec hold - Seated Piriformis Stretch  - 1 x daily - 7 x weekly - 1 sets - 2 reps - 20 sec hold - Seated Long Arc Quad  - 1 x daily - 7 x weekly - 2 sets - 10 reps - Seated March  - 1 x daily - 7 x weekly - 2 sets - 10 reps - Seated Quad Set  - 1 x daily - 7 x weekly - 2 sets - 10 reps  ASSESSMENT:  CLINICAL IMPRESSION: Pt  returns from trip to beach where she aggrevated her knee by walking in the sane and walking longer distances in the sand. Pt reports yesterday it began to feel better and she could bend her knee better. Pt required VC to stand taller in her LTLE in single.  leg stance so she did not compress as much.Pain was less when pt contracted her quad more to decompress the joint.   OBJECTIVE IMPAIRMENTS: decreased balance, difficulty walking, decreased strength, impaired flexibility, and pain.   ACTIVITY LIMITATIONS: lifting, bending, squatting, stairs, and transfers  PARTICIPATION LIMITATIONS: cleaning, community activity, and yard work  PERSONAL FACTORS: Past/current experiences and 3+ comorbidities: CVA, vertigo, OA  are also affecting patient's functional outcome.   REHAB POTENTIAL: Good  CLINICAL DECISION MAKING: Stable/uncomplicated  EVALUATION COMPLEXITY: Moderate   GOALS: Goals reviewed with patient? Yes  SHORT TERM GOALS: Target date: 02/22/2022  Pt will be independent with initial HEP. Baseline: Goal status: MET on 02/27/2022  2.  Pt will decreased time with 5 times sit to/from stand to 15 sec or less to demonstrate improved functional strength. Baseline: 20.3 sec Goal status: MET on 02/27/2022   LONG TERM GOALS: Target date: 03/29/2022   Pt will be  independent with advanced HEP. Baseline:  Goal status: IN PROGRESS  2.  Pt will increase FOTO to at least 56% to demonstrate improvements in functional mobility. Baseline: 40% Goal status: INITIAL  3.  Pt will increase bilateral hip and left knee strength to at least 4+ to 5/5 to allow her to navigate stairs in home with increased ease. Baseline:  Goal status: INITIAL  4.  Pt will report ability to walk for greater than 20 minutes without an increase in knee pain to allow pt to return to walking with her family. Baseline:  Goal status: INITIAL  5.  Pt will deny any dizziness during typical functional tasks. Baseline:  Goal status: INITIAL   PLAN:  PT FREQUENCY: 2x/week  PT DURATION: 8 weeks  PLANNED INTERVENTIONS: Therapeutic exercises, Therapeutic activity, Neuromuscular re-education, Balance training, Gait training, Patient/Family education, Self Care, Joint mobilization, Joint manipulation, Stair training, Vestibular training, Canalith repositioning, Aquatic Therapy, Dry Needling, Electrical stimulation, Spinal manipulation, Spinal mobilization, Cryotherapy, Moist heat, Taping, Vasopneumatic device, Ultrasound, Ionotophoresis 79m/ml Dexamethasone, Manual therapy, and Re-evaluation  PLAN FOR NEXT SESSION: land next, follow up with pt to see if she continued to improved post beach trip flare up    JMyrene Galas PTA 03/20/22 10:23 AM   [place] SLos HuisachesGBattle [place] [address]Phone # [phone] [phone]

## 2022-03-20 ENCOUNTER — Ambulatory Visit: Payer: Medicare Other | Attending: Orthopaedic Surgery | Admitting: Physical Therapy

## 2022-03-20 ENCOUNTER — Encounter: Payer: Self-pay | Admitting: Physical Therapy

## 2022-03-20 DIAGNOSIS — G8929 Other chronic pain: Secondary | ICD-10-CM | POA: Insufficient documentation

## 2022-03-20 DIAGNOSIS — R262 Difficulty in walking, not elsewhere classified: Secondary | ICD-10-CM | POA: Insufficient documentation

## 2022-03-20 DIAGNOSIS — M6281 Muscle weakness (generalized): Secondary | ICD-10-CM | POA: Insufficient documentation

## 2022-03-20 DIAGNOSIS — M25562 Pain in left knee: Secondary | ICD-10-CM | POA: Diagnosis present

## 2022-03-22 ENCOUNTER — Telehealth: Payer: Self-pay | Admitting: Family Medicine

## 2022-03-22 NOTE — Telephone Encounter (Signed)
Left message for patient to schedule Annual Wellness Visit.  Please schedule (telephone/video call) with Nurse Health Advisor Tina Betterson, RN at [facility]. Please call [phone] ask for Kathy 

## 2022-03-23 ENCOUNTER — Other Ambulatory Visit: Payer: Self-pay | Admitting: Family Medicine

## 2022-03-26 ENCOUNTER — Ambulatory Visit: Payer: Medicare Other | Admitting: Rehabilitative and Restorative Service Providers"

## 2022-03-26 ENCOUNTER — Encounter: Payer: Self-pay | Admitting: Rehabilitative and Restorative Service Providers"

## 2022-03-26 DIAGNOSIS — M6281 Muscle weakness (generalized): Secondary | ICD-10-CM

## 2022-03-26 DIAGNOSIS — R262 Difficulty in walking, not elsewhere classified: Secondary | ICD-10-CM

## 2022-03-26 DIAGNOSIS — M25562 Pain in left knee: Secondary | ICD-10-CM | POA: Diagnosis not present

## 2022-03-26 DIAGNOSIS — G8929 Other chronic pain: Secondary | ICD-10-CM

## 2022-03-26 NOTE — Therapy (Unsigned)
OUTPATIENT PHYSICAL THERAPY TREATMENT NOTE AND REASSESSMENT NOTE   Patient Name: JEANI FASSNACHT MRN: [id number] DOB:Sep 05, 1955, 66 y.o., female Today's Date: 03/26/2022   Progress Note Reporting Period 02/01/2022 to 03/27/2023  See note below for Objective Data and Assessment of Progress/Goals.        PT End of Session - 03/26/22 0849     Visit Number 9    Date for PT Re-Evaluation 05/24/22    Authorization Type Medicare/[employer]    Progress Note Due on Visit 52    PT Start Time 0845    PT Stop Time 0925    PT Time Calculation (min) 40 min    Activity Tolerance Patient tolerated treatment well    Behavior During Therapy [facility] for tasks assessed/performed               Past Medical History:  Diagnosis Date   Allergy    Arthritis    Asthma    Back pain    Benign positional vertigo    Bilateral swelling of feet    Bronchitis    Chicken pox    Colon polyps    Constipation    Cough 10/09/2020   COVID-19 10/09/2020   Diverticulitis    Early menopause occurring in patient age younger than 96 years    Fibromyalgia    GERD (gastroesophageal reflux disease)    Gluten intolerance    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 Still unclear what is cuasing her pain. May be nerve related. May benefit from starting on gabapentin. Start gabapentin 300 mg TID. F/u 4 weeks   Night sweats    Palpitations    Recurrent upper respiratory infection (URI)    Right arm weakness 09/28/2015   Last Assessment & Plan:  Formatting of this note might be different from the original. Referral to PT today. 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    Vertigo  08/11/2015   Last Assessment & Plan:  Formatting of this note might be different from the original. Concern given gradual onset over the last week and a half in addition to neck pain. Patient needs neuroimaging to rule out dissecting vertebral aneurysm. Patient advised to go to emergency room. She declined ambulance transport   Vitamin B 12 deficiency    Vitamin D deficiency    Past Surgical History:  Procedure Laterality Date   CARPAL TUNNEL RELEASE     COLONOSCOPY  2015   [name]- [facility], [place]; 5 yr repeat recommended   COLONOSCOPY W/ POLYPECTOMY  05/26/2018   x5 polyps   KNEE ARTHROSCOPY Bilateral 2013   meniscus  x2   NECK SURGERY  2018   disc fusion   UPPER GASTROINTESTINAL ENDOSCOPY     Patient Active Problem List   Diagnosis Date Noted   Insulin resistance 02/04/2022   Other fatigue 01/21/2022   SOB (shortness of breath) 01/21/2022   Depression screening 01/21/2022   Class 1 obesity without serious comorbidity with body mass index (BMI) of 31.0 to 31.9 in adult 01/21/2022   Vitamin D deficiency 01/21/2022   Abnormal glucose 01/21/2022   Other hyperlipidemia  01/21/2022   Other tear of medial meniscus, current injury, left knee, subsequent encounter 01/14/2022   Chronic rhinitis 03/28/2021   Other adverse food reactions, not elsewhere classified, subsequent encounter 50/27/7412   Diastolic dysfunction 87/86/7672   Heart murmur 02/16/2019   Hyperlipidemia LDL goal <100 02/16/2019   Class 1 obesity with serious comorbidity and body mass index (BMI) of 30.0 to 30.9 in adult 01/04/2019   Pelvic floor dysfunction in female 12/06/2018   Constipation due to outlet dysfunction 12/06/2018   Lactose intolerance 12/06/2018   Diverticulosis 05/27/2018   Osteopenia 01/19/2018   Irritable bowel syndrome with constipation 01/17/2018   History of colonic polyp 01/17/2018   History of stroke 01/15/2018   GERD (gastroesophageal reflux disease)    Asthma    S/P cervical spinal  fusion 09/29/2017   Macrocytosis without anemia 11/25/2015   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   Heart palpitations 09/08/2015   Vitamin B12 deficiency 04/06/2014   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   Vestibular neuritis 10/15/2006   Family history of malignant neoplasm of breast 03/18/2006   Diaphragmatic hernia 03/18/2006   Premature menopause 03/18/2006    PCP: Ma Hillock, DO  REFERRING PROVIDER: Leandrew Koyanagi, MD   REFERRING DIAG: [phone] (ICD-10-CM) - Primary osteoarthritis of left knee   THERAPY DIAG:  Chronic pain of left knee - Plan: PT plan of care cert/re-cert  Difficulty in walking, not elsewhere classified - Plan: PT plan of care cert/re-cert  Muscle weakness (generalized) - Plan: PT plan of care cert/re-cert  Rationale for Evaluation and Treatment Rehabilitation  ONSET DATE: 01/07/2022  SUBJECTIVE:   SUBJECTIVE STATEMENT: I went to the beach, walked on sand and walked longer distances mainly because I was feeling better. This increased my pain and swelling almost back to where I was in the beginning. Yesterday it started to finally feel better and swelling is beginning to go down.  PERTINENT HISTORY: CVA, allergies, osteopenia, diverticulosis, Hx of cervical fusion, vestibular neuritis/BPPV, lumbar herniated disc, sleep apnea PAIN:  Are you having pain? Yes: NPRS scale: 4-6/10 Pain location: left knee Pain description: sharp Aggravating factors: movement Relieving factors: rest  PRECAUTIONS: None  WEIGHT BEARING RESTRICTIONS: No  FALLS:  Has patient fallen in last 6 months? No  LIVING ENVIRONMENT: Lives with: lives with their spouse Lives in: House/apartment Stairs: Yes: Internal: 15 steps; on left going up and External: 2 steps; none Has following equipment at home: None  OCCUPATION: Retired Education officer, museum  PLOF: Independent and Leisure: playing with grandchildren, sewing, gardening  PATIENT GOALS: To be able to walk and get  some weight off and enjoy my grandchildren and go on hikes if I want to.  OBJECTIVE:   DIAGNOSTIC FINDINGS:  MRI of left knee on 01/24/22: IMPRESSION: 1. Severe attenuation of the body of the medial meniscus which may reflect prior meniscectomy versus maceration. Degeneration of the remainder of the anterior horn and posterior horns of the medial meniscus. Small oblique tear of the posterior horn of the medial meniscus extending to the superior articular surface. 2. Complex signal in the posterior horn of the lateral meniscus concerning for a tear. 3. Cartilage fissuring of the patellar apex. Partial-thickness cartilage loss of the patellofemoral compartment most severe in the medial patellofemoral compartment. 4. Extensive full-thickness cartilage loss of the medial femorotibial compartment with subchondral reactive marrow edema in the medial tibial plateau.  PATIENT SURVEYS:  Eval:  FOTO 40% (projected 56% by visit 15) 03/26/2022:  FOTO 52%  COGNITION: Overall cognitive status:  Within functional limits for tasks assessed     SENSATION: Reports some numbness/tingling down left LE  EDEMA:  Circumferential: right 43 cm, left 43.5 cm  MUSCLE LENGTH: Bilat hamstring tightness noted  POSTURE: rounded shoulders  PALPATION: Tenderness to palpation along right knee medial joint line  LOWER EXTREMITY ROM:  WFL  LOWER EXTREMITY MMT:  Eval:  Bilateral hips 4/5, Left quad/hamstring 4/5, right quad/hamstring 5/5 03/26/2022:  Bilateral hip strength of 4 to 4+/5, Left quad/hamstrings 4 to 4+/5  LOWER EXTREMITY SPECIAL TESTS:  Knee special tests: Anterior drawer test: negative, Posterior drawer test: positive , and Patellafemoral grind test: negative  FUNCTIONAL TESTS:  Eval:   5 times sit to stand: 20.3 sec with pushing up from thighs  02/19/2022: 3 Minute Walk Test:  643 ft with pain going up to 7/10 towards end of ambulation  02/27/2022: 5 times sit to stand: 13.1 sec  with pushing up from thighs  03/26/2022: 3 Minute Walk Test: 714 ft with pain of 3/10 at end of ambulation 5 times sit to stand: 10.9 sec without UE use, but did use rocking momentum  GAIT: Distance walked: >100 ft Assistive device utilized: None Level of assistance: Complete Independence Comments: Antalgic gait pattern   TODAY'S TREATMENT:  DATE: 03/26/2022 NuStep: Level 5x 7 minutes-PT present to discuss progress Seated with 2#:  marching, LAQ with hold at TKE, heel/toe raises, hip abduction scissors.  2x10 bilat each 3 minute walk test:  714 ft Sit to/from stand x5 Seated hamstring stretch 2x20 seconds bilat Standing hip abduction and hip extension 2x10 bilat Standing calf stretch 2x20 sec bilat   03/20/22:Pt arrives for aquatic physical therapy. Treatment took place in 3.5-5.5 feet of water. Water temperature was 91 degrees F.. Pt entered the pool via stairs step to step with moderate use of the rails. Pt requires buoyancy of water for support and to offload joints with strengthening exercises.  Seated water bench with 75% submersion and discussion of current status. Pt performed seated LE AROM exercises 20x in all planes. 75% depth water walking with hand help single buoy wts 6 lengths each with short rest break to analyze knee before changing direction. Standing hip flex/ext then abduction 10x Bil with some UE on pool wall for balance added ankle fins for resistance today. Single leg stance 10 sec no UE static 1x, added UE movements 10x sagittal plane together then reciprocally Bil. Yellow noodle LE decompression x 3 min at end of session.   DATE: 03/05/2022 Seated with 2#:  marching, LAQ with hold at [facility], heel/toe raises, hip adduction ball squeeze, hip abduction scissors.  2x10 bilat each Seated hip abduction and hamstring curls with red tband 2x10 bilat Sit to/from stand (seated on purple foam for increased chair height) 2x10 Seated hamstring stretch 2x20 seconds  bilat NuStep: Level 5x 6 minutes-PT present to discuss progress Standing hip abduction and hip extension 2x10 bilat   PATIENT EDUCATION:  Education details: Issued HEP Person educated: Patient Education method: Explanation, Demonstration, and Handouts Education comprehension: verbalized understanding and returned demonstration  HOME EXERCISE PROGRAM: Access Code: [email or web address] URL: [email or web address] Date: 02/01/2022 Prepared by: Shelby Dubin Menke  Exercises - Seated Hamstring Stretch  - 1 x daily - 7 x weekly - 1 sets - 2 reps - 20 sec hold - Seated Piriformis Stretch  - 1 x daily - 7 x weekly - 1 sets - 2 reps - 20 sec hold - Seated Long Arc Quad  - 1 x daily - 7 x weekly -  2 sets - 10 reps - Seated March  - 1 x daily - 7 x weekly - 2 sets - 10 reps - Seated Quad Set  - 1 x daily - 7 x weekly - 2 sets - 10 reps  ASSESSMENT:  CLINICAL IMPRESSION: Ms Eaker presents to skilled PT reporting that she is still having some increased left knee pain after traveling down to [place] and walking for 2 miles.  Patient does state that she is starting to feel better, but had increased pain for a long time following the long walk.  Patient is making progress with improved distance/decreased pain on 3 minute walk test and improved time with 5 times sit to/from stand.  Patient continues making progress with strengthening.  Goals not yet met and pt continues to have increased pain, therefore pt would benefit from continued skilled PT of 2x/week for 8 weeks to further progress towards decreased pain with functional activities.  OBJECTIVE IMPAIRMENTS: decreased balance, difficulty walking, decreased strength, impaired flexibility, and pain.   ACTIVITY LIMITATIONS: lifting, bending, squatting, stairs, and transfers  PARTICIPATION LIMITATIONS: cleaning, community activity, and yard work  PERSONAL FACTORS: Past/current experiences and 3+ comorbidities: CVA, vertigo, OA  are also affecting  patient's functional outcome.   REHAB POTENTIAL: Good  CLINICAL DECISION MAKING: Stable/uncomplicated  EVALUATION COMPLEXITY: Moderate   GOALS: Goals reviewed with patient? Yes  SHORT TERM GOALS: Target date: 02/22/2022  Pt will be independent with initial HEP. Baseline: Goal status: MET on 02/27/2022  2.  Pt will decreased time with 5 times sit to/from stand to 15 sec or less to demonstrate improved functional strength. Baseline: 20.3 sec Goal status: MET on 02/27/2022   LONG TERM GOALS: Target date: 03/29/2022   Pt will be independent with advanced HEP. Baseline:  Goal status: IN PROGRESS  2.  Pt will increase FOTO to at least 56% to demonstrate improvements in functional mobility. Baseline: 40% Goal status: IN PROGRESS  3.  Pt will increase bilateral hip and left knee strength to at least 4+ to 5/5 to allow her to navigate stairs in home with increased ease. Baseline:  Goal status: IN PROGRESS  4.  Pt will report ability to walk for greater than 20 minutes without an increase in knee pain to allow pt to return to walking with her family. Baseline:  Goal status: IN PROGRESS (ambulated for 2 miles over Thanksgiving and had increased pain noted)  5.  Pt will deny any dizziness during typical functional tasks. Baseline:  Goal status: IN PROGRESS (no dizziness reported on 03/26/22)   PLAN:  PT FREQUENCY: 2x/week  PT DURATION: 8 weeks  PLANNED INTERVENTIONS: Therapeutic exercises, Therapeutic activity, Neuromuscular re-education, Balance training, Gait training, Patient/Family education, Self Care, Joint mobilization, Joint manipulation, Stair training, Vestibular training, Canalith repositioning, Aquatic Therapy, Dry Needling, Electrical stimulation, Spinal manipulation, Spinal mobilization, Cryotherapy, Moist heat, Taping, Vasopneumatic device, Ultrasound, Ionotophoresis 30m/ml Dexamethasone, Manual therapy, and Re-evaluation  PLAN FOR NEXT SESSION: strengthening,  follow up with pt to see if she continued to improved post beach trip flare up    SJuel Burrow PT, DPT 03/26/22 9:31 AM   [place] [place] [id number] # [phone] [phone]

## 2022-03-26 NOTE — Therapy (Signed)
OUTPATIENT PHYSICAL THERAPY TREATMENT NOTE AND REASSESSMENT NOTE   Patient Name: JEANI FASSNACHT MRN: [id number] DOB:Sep 05, 1955, 66 y.o., female Today's Date: 03/26/2022   Progress Note Reporting Period 02/01/2022 to 03/27/2023  See note below for Objective Data and Assessment of Progress/Goals.        PT End of Session - 03/26/22 0849     Visit Number 9    Date for PT Re-Evaluation 05/24/22    Authorization Type Medicare/[employer]    Progress Note Due on Visit 52    PT Start Time 0845    PT Stop Time 0925    PT Time Calculation (min) 40 min    Activity Tolerance Patient tolerated treatment well    Behavior During Therapy [facility] for tasks assessed/performed               Past Medical History:  Diagnosis Date   Allergy    Arthritis    Asthma    Back pain    Benign positional vertigo    Bilateral swelling of feet    Bronchitis    Chicken pox    Colon polyps    Constipation    Cough 10/09/2020   COVID-19 10/09/2020   Diverticulitis    Early menopause occurring in patient age younger than 96 years    Fibromyalgia    GERD (gastroesophageal reflux disease)    Gluten intolerance    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 Still unclear what is cuasing her pain. May be nerve related. May benefit from starting on gabapentin. Start gabapentin 300 mg TID. F/u 4 weeks   Night sweats    Palpitations    Recurrent upper respiratory infection (URI)    Right arm weakness 09/28/2015   Last Assessment & Plan:  Formatting of this note might be different from the original. Referral to PT today. 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    Vertigo  08/11/2015   Last Assessment & Plan:  Formatting of this note might be different from the original. Concern given gradual onset over the last week and a half in addition to neck pain. Patient needs neuroimaging to rule out dissecting vertebral aneurysm. Patient advised to go to emergency room. She declined ambulance transport   Vitamin B 12 deficiency    Vitamin D deficiency    Past Surgical History:  Procedure Laterality Date   CARPAL TUNNEL RELEASE     COLONOSCOPY  2015   [name]- [facility], [place]; 5 yr repeat recommended   COLONOSCOPY W/ POLYPECTOMY  05/26/2018   x5 polyps   KNEE ARTHROSCOPY Bilateral 2013   meniscus  x2   NECK SURGERY  2018   disc fusion   UPPER GASTROINTESTINAL ENDOSCOPY     Patient Active Problem List   Diagnosis Date Noted   Insulin resistance 02/04/2022   Other fatigue 01/21/2022   SOB (shortness of breath) 01/21/2022   Depression screening 01/21/2022   Class 1 obesity without serious comorbidity with body mass index (BMI) of 31.0 to 31.9 in adult 01/21/2022   Vitamin D deficiency 01/21/2022   Abnormal glucose 01/21/2022   Other hyperlipidemia  01/21/2022   Other tear of medial meniscus, current injury, left knee, subsequent encounter 01/14/2022   Chronic rhinitis 03/28/2021   Other adverse food reactions, not elsewhere classified, subsequent encounter 50/27/7412   Diastolic dysfunction 87/86/7672   Heart murmur 02/16/2019   Hyperlipidemia LDL goal <100 02/16/2019   Class 1 obesity with serious comorbidity and body mass index (BMI) of 30.0 to 30.9 in adult 01/04/2019   Pelvic floor dysfunction in female 12/06/2018   Constipation due to outlet dysfunction 12/06/2018   Lactose intolerance 12/06/2018   Diverticulosis 05/27/2018   Osteopenia 01/19/2018   Irritable bowel syndrome with constipation 01/17/2018   History of colonic polyp 01/17/2018   History of stroke 01/15/2018   GERD (gastroesophageal reflux disease)    Asthma    S/P cervical spinal  fusion 09/29/2017   Macrocytosis without anemia 11/25/2015   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   Heart palpitations 09/08/2015   Vitamin B12 deficiency 04/06/2014   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   Vestibular neuritis 10/15/2006   Family history of malignant neoplasm of breast 03/18/2006   Diaphragmatic hernia 03/18/2006   Premature menopause 03/18/2006    PCP: Ma Hillock, DO  REFERRING PROVIDER: Leandrew Koyanagi, MD   REFERRING DIAG: [phone] (ICD-10-CM) - Primary osteoarthritis of left knee   THERAPY DIAG:  Chronic pain of left knee - Plan: PT plan of care cert/re-cert  Difficulty in walking, not elsewhere classified - Plan: PT plan of care cert/re-cert  Muscle weakness (generalized) - Plan: PT plan of care cert/re-cert  Rationale for Evaluation and Treatment Rehabilitation  ONSET DATE: 01/07/2022  SUBJECTIVE:   SUBJECTIVE STATEMENT: I went to the beach, walked on sand and walked longer distances mainly because I was feeling better. This increased my pain and swelling almost back to where I was in the beginning. Yesterday it started to finally feel better and swelling is beginning to go down.  PERTINENT HISTORY: CVA, allergies, osteopenia, diverticulosis, Hx of cervical fusion, vestibular neuritis/BPPV, lumbar herniated disc, sleep apnea PAIN:  Are you having pain? Yes: NPRS scale: 4-6/10 Pain location: left knee Pain description: sharp Aggravating factors: movement Relieving factors: rest  PRECAUTIONS: None  WEIGHT BEARING RESTRICTIONS: No  FALLS:  Has patient fallen in last 6 months? No  LIVING ENVIRONMENT: Lives with: lives with their spouse Lives in: House/apartment Stairs: Yes: Internal: 15 steps; on left going up and External: 2 steps; none Has following equipment at home: None  OCCUPATION: Retired Education officer, museum  PLOF: Independent and Leisure: playing with grandchildren, sewing, gardening  PATIENT GOALS: To be able to walk and get  some weight off and enjoy my grandchildren and go on hikes if I want to.  OBJECTIVE:   DIAGNOSTIC FINDINGS:  MRI of left knee on 01/24/22: IMPRESSION: 1. Severe attenuation of the body of the medial meniscus which may reflect prior meniscectomy versus maceration. Degeneration of the remainder of the anterior horn and posterior horns of the medial meniscus. Small oblique tear of the posterior horn of the medial meniscus extending to the superior articular surface. 2. Complex signal in the posterior horn of the lateral meniscus concerning for a tear. 3. Cartilage fissuring of the patellar apex. Partial-thickness cartilage loss of the patellofemoral compartment most severe in the medial patellofemoral compartment. 4. Extensive full-thickness cartilage loss of the medial femorotibial compartment with subchondral reactive marrow edema in the medial tibial plateau.  PATIENT SURVEYS:  Eval:  FOTO 40% (projected 56% by visit 15) 03/26/2022:  FOTO 52%  COGNITION: Overall cognitive status:  Within functional limits for tasks assessed     SENSATION: Reports some numbness/tingling down left LE  EDEMA:  Circumferential: right 43 cm, left 43.5 cm  MUSCLE LENGTH: Bilat hamstring tightness noted  POSTURE: rounded shoulders  PALPATION: Tenderness to palpation along right knee medial joint line  LOWER EXTREMITY ROM:  WFL  LOWER EXTREMITY MMT:  Eval:  Bilateral hips 4/5, Left quad/hamstring 4/5, right quad/hamstring 5/5 03/26/2022:  Bilateral hip strength of 4 to 4+/5, Left quad/hamstrings 4 to 4+/5  LOWER EXTREMITY SPECIAL TESTS:  Knee special tests: Anterior drawer test: negative, Posterior drawer test: positive , and Patellafemoral grind test: negative  FUNCTIONAL TESTS:  Eval:   5 times sit to stand: 20.3 sec with pushing up from thighs  02/19/2022: 3 Minute Walk Test:  643 ft with pain going up to 7/10 towards end of ambulation  02/27/2022: 5 times sit to stand: 13.1 sec  with pushing up from thighs  03/26/2022: 3 Minute Walk Test: 714 ft with pain of 3/10 at end of ambulation 5 times sit to stand: 10.9 sec without UE use, but did use rocking momentum  GAIT: Distance walked: >100 ft Assistive device utilized: None Level of assistance: Complete Independence Comments: Antalgic gait pattern   TODAY'S TREATMENT:  DATE: 03/26/2022 NuStep: Level 5x 7 minutes-PT present to discuss progress Seated with 2#:  marching, LAQ with hold at TKE, heel/toe raises, hip abduction scissors.  2x10 bilat each 3 minute walk test:  714 ft Sit to/from stand x5 Seated hamstring stretch 2x20 seconds bilat Standing hip abduction and hip extension 2x10 bilat Standing calf stretch 2x20 sec bilat   03/20/22:Pt arrives for aquatic physical therapy. Treatment took place in 3.5-5.5 feet of water. Water temperature was 91 degrees F.. Pt entered the pool via stairs step to step with moderate use of the rails. Pt requires buoyancy of water for support and to offload joints with strengthening exercises.  Seated water bench with 75% submersion and discussion of current status. Pt performed seated LE AROM exercises 20x in all planes. 75% depth water walking with hand help single buoy wts 6 lengths each with short rest break to analyze knee before changing direction. Standing hip flex/ext then abduction 10x Bil with some UE on pool wall for balance added ankle fins for resistance today. Single leg stance 10 sec no UE static 1x, added UE movements 10x sagittal plane together then reciprocally Bil. Yellow noodle LE decompression x 3 min at end of session.   DATE: 03/05/2022 Seated with 2#:  marching, LAQ with hold at [facility], heel/toe raises, hip adduction ball squeeze, hip abduction scissors.  2x10 bilat each Seated hip abduction and hamstring curls with red tband 2x10 bilat Sit to/from stand (seated on purple foam for increased chair height) 2x10 Seated hamstring stretch 2x20 seconds  bilat NuStep: Level 5x 6 minutes-PT present to discuss progress Standing hip abduction and hip extension 2x10 bilat   PATIENT EDUCATION:  Education details: Issued HEP Person educated: Patient Education method: Explanation, Demonstration, and Handouts Education comprehension: verbalized understanding and returned demonstration  HOME EXERCISE PROGRAM: Access Code: [email or web address] URL: [email or web address] Date: 02/01/2022 Prepared by: Shelby Dubin Menke  Exercises - Seated Hamstring Stretch  - 1 x daily - 7 x weekly - 1 sets - 2 reps - 20 sec hold - Seated Piriformis Stretch  - 1 x daily - 7 x weekly - 1 sets - 2 reps - 20 sec hold - Seated Long Arc Quad  - 1 x daily - 7 x weekly -  2 sets - 10 reps - Seated March  - 1 x daily - 7 x weekly - 2 sets - 10 reps - Seated Quad Set  - 1 x daily - 7 x weekly - 2 sets - 10 reps  ASSESSMENT:  CLINICAL IMPRESSION: Ms Eaker presents to skilled PT reporting that she is still having some increased left knee pain after traveling down to [place] and walking for 2 miles.  Patient does state that she is starting to feel better, but had increased pain for a long time following the long walk.  Patient is making progress with improved distance/decreased pain on 3 minute walk test and improved time with 5 times sit to/from stand.  Patient continues making progress with strengthening.  Goals not yet met and pt continues to have increased pain, therefore pt would benefit from continued skilled PT of 2x/week for 8 weeks to further progress towards decreased pain with functional activities.  OBJECTIVE IMPAIRMENTS: decreased balance, difficulty walking, decreased strength, impaired flexibility, and pain.   ACTIVITY LIMITATIONS: lifting, bending, squatting, stairs, and transfers  PARTICIPATION LIMITATIONS: cleaning, community activity, and yard work  PERSONAL FACTORS: Past/current experiences and 3+ comorbidities: CVA, vertigo, OA  are also affecting  patient's functional outcome.   REHAB POTENTIAL: Good  CLINICAL DECISION MAKING: Stable/uncomplicated  EVALUATION COMPLEXITY: Moderate   GOALS: Goals reviewed with patient? Yes  SHORT TERM GOALS: Target date: 02/22/2022  Pt will be independent with initial HEP. Baseline: Goal status: MET on 02/27/2022  2.  Pt will decreased time with 5 times sit to/from stand to 15 sec or less to demonstrate improved functional strength. Baseline: 20.3 sec Goal status: MET on 02/27/2022   LONG TERM GOALS: Target date: 03/29/2022   Pt will be independent with advanced HEP. Baseline:  Goal status: IN PROGRESS  2.  Pt will increase FOTO to at least 56% to demonstrate improvements in functional mobility. Baseline: 40% Goal status: IN PROGRESS  3.  Pt will increase bilateral hip and left knee strength to at least 4+ to 5/5 to allow her to navigate stairs in home with increased ease. Baseline:  Goal status: IN PROGRESS  4.  Pt will report ability to walk for greater than 20 minutes without an increase in knee pain to allow pt to return to walking with her family. Baseline:  Goal status: IN PROGRESS (ambulated for 2 miles over Thanksgiving and had increased pain noted)  5.  Pt will deny any dizziness during typical functional tasks. Baseline:  Goal status: IN PROGRESS (no dizziness reported on 03/26/22)   PLAN:  PT FREQUENCY: 2x/week  PT DURATION: 8 weeks  PLANNED INTERVENTIONS: Therapeutic exercises, Therapeutic activity, Neuromuscular re-education, Balance training, Gait training, Patient/Family education, Self Care, Joint mobilization, Joint manipulation, Stair training, Vestibular training, Canalith repositioning, Aquatic Therapy, Dry Needling, Electrical stimulation, Spinal manipulation, Spinal mobilization, Cryotherapy, Moist heat, Taping, Vasopneumatic device, Ultrasound, Ionotophoresis 30m/ml Dexamethasone, Manual therapy, and Re-evaluation  PLAN FOR NEXT SESSION: strengthening,  follow up with pt to see if she continued to improved post beach trip flare up    SJuel Burrow PT, DPT 03/26/22 9:31 AM   [place] [place] [id number] # [phone] [phone]

## 2022-03-27 ENCOUNTER — Encounter: Payer: Self-pay | Admitting: Physical Therapy

## 2022-03-27 ENCOUNTER — Ambulatory Visit: Payer: Medicare Other | Admitting: Physical Therapy

## 2022-03-27 DIAGNOSIS — M25562 Pain in left knee: Secondary | ICD-10-CM | POA: Diagnosis not present

## 2022-03-27 DIAGNOSIS — R262 Difficulty in walking, not elsewhere classified: Secondary | ICD-10-CM

## 2022-03-27 DIAGNOSIS — M6281 Muscle weakness (generalized): Secondary | ICD-10-CM

## 2022-03-27 DIAGNOSIS — G8929 Other chronic pain: Secondary | ICD-10-CM

## 2022-03-29 ENCOUNTER — Ambulatory Visit (INDEPENDENT_AMBULATORY_CARE_PROVIDER_SITE_OTHER): Payer: Medicare Other | Admitting: Family Medicine

## 2022-03-29 ENCOUNTER — Encounter: Payer: Self-pay | Admitting: Family Medicine

## 2022-03-29 VITALS — BP 117/75 | HR 70 | Temp 97.9°F | Wt 181.0 lb

## 2022-03-29 DIAGNOSIS — E6609 Other obesity due to excess calories: Secondary | ICD-10-CM

## 2022-03-29 DIAGNOSIS — E785 Hyperlipidemia, unspecified: Secondary | ICD-10-CM

## 2022-03-29 DIAGNOSIS — E66811 Obesity, class 1: Secondary | ICD-10-CM

## 2022-03-29 DIAGNOSIS — Z5181 Encounter for therapeutic drug level monitoring: Secondary | ICD-10-CM

## 2022-03-29 DIAGNOSIS — E538 Deficiency of other specified B group vitamins: Secondary | ICD-10-CM | POA: Diagnosis not present

## 2022-03-29 DIAGNOSIS — Z860100 Personal history of colon polyps, unspecified: Secondary | ICD-10-CM

## 2022-03-29 DIAGNOSIS — Z23 Encounter for immunization: Secondary | ICD-10-CM | POA: Diagnosis not present

## 2022-03-29 DIAGNOSIS — E559 Vitamin D deficiency, unspecified: Secondary | ICD-10-CM

## 2022-03-29 DIAGNOSIS — Z6830 Body mass index (BMI) 30.0-30.9, adult: Secondary | ICD-10-CM

## 2022-03-29 DIAGNOSIS — K219 Gastro-esophageal reflux disease without esophagitis: Secondary | ICD-10-CM

## 2022-03-29 DIAGNOSIS — Z79899 Other long term (current) drug therapy: Secondary | ICD-10-CM

## 2022-03-29 DIAGNOSIS — Z8673 Personal history of transient ischemic attack (TIA), and cerebral infarction without residual deficits: Secondary | ICD-10-CM

## 2022-03-29 DIAGNOSIS — M8589 Other specified disorders of bone density and structure, multiple sites: Secondary | ICD-10-CM

## 2022-03-29 MED ORDER — ESOMEPRAZOLE MAGNESIUM 40 MG PO CPDR: 40.0000 mg | DELAYED_RELEASE_CAPSULE | Freq: Every day | ORAL | 3 refills | Status: DC

## 2022-03-29 MED ORDER — ATORVASTATIN CALCIUM 20 MG PO TABS: 20.0000 mg | ORAL_TABLET | Freq: Every day | ORAL | 3 refills | Status: DC

## 2022-03-29 NOTE — Patient Instructions (Signed)
No follow-ups on file.        Great to see you today.  I have refilled the medication(s) we provide.   If labs were collected, we will inform you of lab results once received either by echart message or telephone call.   - echart message- for normal results that have been seen by the patient already.   - telephone call: abnormal results or if patient has not viewed results in their echart.  

## 2022-03-29 NOTE — Progress Notes (Signed)
Patient ID: Kayla Rosales, female  DOB: [birth year], 66 y.o.   MRN: [id number] Patient Care Team    Relationship Specialty Notifications Start End  Ma Hillock, DO PCP - General Family Medicine  01/15/18   Elouise Munroe, MD PCP - Cardiology Cardiology Admissions 07/09/19   Gatha Mayer, MD Consulting Physician Gastroenterology  01/19/18   Princess Bruins, MD Consulting Physician Obstetrics and Gynecology  04/03/20     Chief Complaint  Patient presents with   Hyperlipidemia    Subjective: Kayla Rosales is a 66 y.o.  Female  present for chronic medical conditions. .  All past medical history, surgical history, allergies, family history, immunizations, medications and social history were updated in the electronic medical record today. All recent labs, ED visits and hospitalizations within the last year were reviewed.  Hyperlipidemia/h/o stroke/murmur:  Patient suffered from a stroke in 2017.  Since that time she has been on atorvastatin 20 mg qd. She is compliant with medication.        01/21/2022    8:28 AM 03/19/2021    9:31 AM 10/24/2020    8:53 AM 04/03/2020   10:40 AM 08/17/2019    8:29 AM  Depression screen PHQ 2/9  Decreased Interest 3 0 0 0 1  Down, Depressed, Hopeless 1 0 0 0 1  PHQ - 2 Score 4 0 0 0 2  Altered sleeping 1 1 0 3 3  Tired, decreased energy 3 1 0 3 3  Change in appetite 2 0 0 0 1  Feeling bad or failure about yourself  0 0 0 0 1  Trouble concentrating 1 0 0 0 1  Moving slowly or fidgety/restless 0 0 0 0 2  Suicidal thoughts 0 0 0 0 0  PHQ-9 Score 11 2 0 6 13  Difficult doing work/chores Somewhat difficult  Not difficult at all  Somewhat difficult      01/21/2022    8:28 AM 03/19/2021    9:31 AM 10/24/2020    8:53 AM 04/03/2020   10:40 AM 08/17/2019    8:29 AM  Depression screen PHQ 2/9  Decreased Interest 3 0 0 0 1  Down, Depressed, Hopeless 1 0 0 0 1  PHQ - 2 Score 4 0 0 0 2  Altered sleeping 1 1 0 3 3  Tired, decreased energy 3 1 0  3 3  Change in appetite 2 0 0 0 1  Feeling bad or failure about yourself  0 0 0 0 1  Trouble concentrating 1 0 0 0 1  Moving slowly or fidgety/restless 0 0 0 0 2  Suicidal thoughts 0 0 0 0 0  PHQ-9 Score 11 2 0 6 13  Difficult doing work/chores Somewhat difficult  Not difficult at all  Somewhat difficult      03/19/2021    9:31 AM  GAD 7 : Generalized Anxiety Score  Nervous, Anxious, on Edge 0  Control/stop worrying 0  Worry too much - different things 1  Trouble relaxing 0  Restless 0  Easily annoyed or irritable 0  Afraid - awful might happen 0  Total GAD 7 Score 1     Immunization History  Administered Date(s) Administered   Fluad Quad(high Dose 65+) 03/27/2020, 03/19/2021, 03/29/2022   Influenza Inj Mdck Quad Pf 03/27/2017   Influenza, High Dose Seasonal PF 01/15/2018   Influenza, Quadrivalent, Recombinant, Inj, Pf 02/16/2019   Influenza, Seasonal, Injecte, Preservative Fre 03/27/2017   Influenza,inj,Quad PF,6+ Mos  02/16/2019   Influenza-Unspecified 01/21/2016, 02/16/2019   Moderna Covid-19 Vaccine Bivalent Booster 38yr & up 03/30/2021   Moderna Sars-Covid-2 Vaccination 06/10/2019, 07/09/2019, 03/02/2020   Pneumococcal Conjugate-13 04/03/2020   Pneumococcal Polysaccharide-23 08/13/2015   Tdap 12/30/2011, 01/21/2016   Zoster Recombinat (Shingrix) 02/16/2019, 04/22/2019    Past Medical History:  Diagnosis Date   Allergy    Arthritis    Asthma    Back pain    Benign positional vertigo    Bilateral swelling of feet    Bronchitis    Chicken pox    Colon polyps    Constipation    Cough 10/09/2020   COVID-19 10/09/2020   Diverticulitis    Early menopause occurring in patient age younger than 467years    Fibromyalgia    GERD (gastroesophageal reflux disease)    Gluten intolerance    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 Still unclear what is cuasing her pain. May be nerve related. May benefit from starting on gabapentin. Start gabapentin 300 mg TID. F/u 4 weeks   Night sweats    Palpitations    Recurrent upper respiratory infection (URI)    Right arm weakness 09/28/2015   Last Assessment & Plan:  Formatting of this note might be different from the original. Referral to PT today. Suspect residual after stroke in left basal ganglia   Sleep apnea    Snoring 01/04/2019   St[place]cerebrum) [place]   right UE/neck sx, resolved.    Vertigo 08/11/2015   Last Assessment & Plan:  Formatting of this note might be different from the original. Concern given gradual onset over the last week and a half in addition to neck pain. Patient needs neuroimaging to rule out dissecting vertebral aneurysm. Patient advised to go to emergency room. She declined ambulance transport   Vitamin B 12 deficiency    Vitamin D deficiency    Allergies  Allergen Reactions   Hydromorphone Other (See Comments)    HYPOTENSION   Meperidine Other (See Comments)    "BP drops"   Sulfa Antibiotics Other (See Comments)    States she does not know the reaction    Past Surgical History:  Procedure Laterality Date   CARPAL TUNNEL RELEASE     [place]   COLONOSCOPY W/ POLYPECTOMY  05/26/2018   x5 polyps   KNEE ARTHROSCOPY Bilateral 2013   meniscus  x2   NECK SURGERY  2018   disc fusion   UPPER GASTROINTESTINAL ENDOSCOPY     Family History  Problem Relation Age of Onset   Depression Mother    Heart disease Mother    Early death Mother 449  Hypertension Mother    Thyroid disease Mother    Anxiety disorder Mother    Obesity Mother    Arthritis Father    Heart disease Father    Hypertension Father    Stroke Father    Depression Sister 530  Early death Sister    Drug  abuse Sister    Lung cancer Sister    Mental illness Sister    COPD Sister    Diabetes Maternal Grandmother    Early death Maternal Grandmother  Early death Maternal Grandfather    Heart disease Maternal Grandfather    Breast cancer Cousin    Colon cancer Neg Hx    Esophageal cancer Neg Hx    Stomach cancer Neg Hx    Pancreatic cancer Neg Hx    Rectal cancer Neg Hx    Social History   Social History Narrative   Married second family - 52 kids 4 grandkids blended   Haematologist, retired Pharmacist, hospital   Caffeine 2/day   Smoker - never   EtOH - rare   No drugs   Wears her seatbelt, smoke alarm at home.   Feels safe in her relationships.    Allergies as of 03/29/2022       Reactions   Hydromorphone Other (See Comments)   HYPOTENSION   Meperidine Other (See Comments)   "BP drops"   Sulfa Antibiotics Other (See Comments)   States she does not know the reaction        Medication List        Accurate as of March 29, 2022  3:21 PM. If you have any questions, ask your nurse or doctor.          STOP taking these medications    benzonatate 200 MG capsule Commonly known as: TESSALON Stopped by: Howard Pouch, DO       TAKE these medications    albuterol 108 (90 Base) MCG/ACT inhaler Commonly known as: VENTOLIN HFA TAKE 2 PUFFS BY MOUTH EVERY 6 HOURS AS NEEDED FOR WHEEZE OR SHORTNESS OF BREATH   ALIVE WOMENS 50+ PO Take by mouth daily.   aspirin EC 81 MG tablet   atorvastatin 20 MG tablet Commonly known as: LIPITOR Take 1 tablet (20 mg total) by mouth daily.   Benefiber Powd Take 5 mg by mouth daily.   cetirizine 5 MG tablet Commonly known as: ZYRTEC Take 5 mg by mouth daily.   Cholecalciferol 25 MCG (1000 UT) capsule Take 1,000 Units by mouth daily.   cyanocobalamin 1000 MCG tablet Take 500 mcg by mouth daily.   esomeprazole 40 MG capsule Commonly known as: NEXIUM Take 1 capsule (40 mg total) by mouth daily at 12 noon.   fexofenadine  180 MG tablet Commonly known as: ALLEGRA Take 180 mg by mouth daily.   fluticasone 110 MCG/ACT inhaler Commonly known as: FLOVENT HFA Inhale into the lungs 2 (two) times daily.   fluticasone 50 MCG/ACT nasal spray Commonly known as: FLONASE Place into both nostrils daily.   Magnesium 250 MG Tabs Take 1 tablet by mouth daily.   metFORMIN 500 MG 24 hr tablet Commonly known as: GLUCOPHAGE-XR Take 1 tablet (500 mg total) by mouth daily with breakfast.   polyethylene glycol powder 17 GM/SCOOP powder Commonly known as: GLYCOLAX/MIRALAX Take by mouth.   Probiotic Advanced Caps daily.        All past medical history, surgical history, allergies, family history, immunizations andmedications were updated in the EMR today and reviewed under the history and medication portions of their EMR.      ROS: 14 pt review of systems performed and negative (unless mentioned in an HPI)  Objective: BP 117/75   Pulse 70   Temp 97.9 F (36.6 C)   Wt 181 lb (82.1 kg)   SpO2 96%   BMI 31.07 kg/m  Physical Exam Vitals and nursing note reviewed.  Constitutional:      General: She is not in acute distress.    Appearance: Normal appearance. She is not ill-appearing  or toxic-appearing.  HENT:     Head: Normocephalic and atraumatic.     Right Ear: Tympanic membrane, ear canal and external ear normal. There is no impacted cerumen.     Left Ear: Tympanic membrane, ear canal and external ear normal. There is no impacted cerumen.     Nose: No congestion or rhinorrhea.     Mouth/Throat:     Mouth: Mucous membranes are moist.     Pharynx: Oropharynx is clear. No oropharyngeal exudate or posterior oropharyngeal erythema.  Eyes:     General: No scleral icterus.       Right eye: No discharge.        Left eye: No discharge.     Extraocular Movements: Extraocular movements intact.     Conjunctiva/sclera: Conjunctivae normal.     Pupils: Pupils are equal, round, and reactive to light.   Cardiovascular:     Rate and Rhythm: Normal rate and regular rhythm.     Pulses: Normal pulses.     Heart sounds: Normal heart sounds. No murmur heard.    No friction rub. No gallop.  Pulmonary:     Effort: Pulmonary effort is normal. No respiratory distress.     Breath sounds: Normal breath sounds. No stridor. No wheezing, rhonchi or rales.  Chest:     Chest wall: No tenderness.  Abdominal:     General: Abdomen is flat. Bowel sounds are normal. There is no distension.     Palpations: Abdomen is soft. There is no mass.     Tenderness: There is no abdominal tenderness. There is no right CVA tenderness, left CVA tenderness, guarding or rebound.     Hernia: No hernia is present.  Musculoskeletal:        General: No swelling, tenderness or deformity. Normal range of motion.     Cervical back: Normal range of motion and neck supple. No rigidity or tenderness.     Right lower leg: No edema.     Left lower leg: No edema.  Lymphadenopathy:     Cervical: No cervical adenopathy.  Skin:    General: Skin is warm and dry.     Coloration: Skin is not jaundiced or pale.     Findings: No bruising, erythema, lesion or rash.  Neurological:     General: No focal deficit present.     Mental Status: She is alert and oriented to person, place, and time. Mental status is at baseline.     Cranial Nerves: No cranial nerve deficit.     Sensory: No sensory deficit.     Motor: No weakness.     Coordination: Coordination normal.     Gait: Gait normal.     Deep Tendon Reflexes: Reflexes normal.  Psychiatric:        Mood and Affect: Mood normal.        Behavior: Behavior normal.        Thought Content: Thought content normal.        Judgment: Judgment normal.      No results found.  Assessment/plan: Kayla Rosales is a 66 y.o. female present for [facility] Hyperlipidemia LDL goal <100/ History of stroke/obesity Continue atrovastatin 20 mg qd. Diet and exercise.  CBC, CMP, TSH, A1c and lipids collected  today  Gastroesophageal reflux disease, unspecified whether esophagitis present/long-term PPI therapy Stable Continue nexium. B12, vitamin D and magnesium collected today  History of colonic polyp Up to date- due 2025  Osteopenia of multiple sites/vit d/d/o bone/Disorder of bone -  Vitamin D (25 hydroxy) Dexa due 2025  Immunizations: PNA20 and influenza vaccines administered today.  .  Return in about 24 weeks (around 09/13/2022) for Routine chronic condition follow-up.   Orders Placed This Encounter  Procedures   Flu Vaccine QUAD High Dose(Fluad)   Pneumococcal conjugate vaccine 20-valent   Vitamin B12   VITAMIN D 25 Hydroxy (Vit-D Deficiency, Fractures)   TSH   Lipid panel   Hemoglobin A1c   Comprehensive metabolic panel   CBC   Magnesium   Meds ordered this encounter  Medications   atorvastatin (LIPITOR) 20 MG tablet    Sig: Take 1 tablet (20 mg total) by mouth daily.    Dispense:  90 tablet    Refill:  3   esomeprazole (NEXIUM) 40 MG capsule    Sig: Take 1 capsule (40 mg total) by mouth daily at 12 noon.    Dispense:  90 capsule    Refill:  3    Referral Orders  No referral(s) requested today      Electronically signed by: Howard Pouch, Leigh

## 2022-03-30 LAB — COMPREHENSIVE METABOLIC PANEL
AG Ratio: 1.9 (calc) (ref 1.0–2.5)
ALT: 18 U/L (ref 6–29)
AST: 18 U/L (ref 10–35)
Albumin: 4.3 g/dL (ref 3.6–5.1)
Alkaline phosphatase (APISO): 92 U/L (ref 37–153)
BUN: 16 mg/dL (ref 7–25)
CO2: 28 mmol/L (ref 20–32)
Calcium: 9.2 mg/dL (ref 8.6–10.4)
Chloride: 102 mmol/L (ref 98–110)
Creat: 0.61 mg/dL (ref 0.50–1.05)
Globulin: 2.3 g/dL (calc) (ref 1.9–3.7)
Glucose, Bld: 86 mg/dL (ref 65–99)
Potassium: 4.2 mmol/L (ref 3.5–5.3)
Sodium: 136 mmol/L (ref 135–146)
Total Bilirubin: 0.9 mg/dL (ref 0.2–1.2)
Total Protein: 6.6 g/dL (ref 6.1–8.1)

## 2022-03-30 LAB — LIPID PANEL
Cholesterol: 122 mg/dL (ref <200)
HDL: 38 mg/dL — ABNORMAL LOW (ref >=50)
LDL Cholesterol (Calc): 57 mg/dL (calc)
Non-HDL Cholesterol (Calc): 84 mg/dL (calc) (ref <130)
Total CHOL/HDL Ratio: 3.2 (calc) (ref <5.0)
Triglycerides: 209 mg/dL — ABNORMAL HIGH (ref <150)

## 2022-03-30 LAB — CBC
HCT: 42.5 % (ref 35.0–45.0)
Hemoglobin: 15.4 g/dL (ref 11.7–15.5)
MCH: 35.5 pg — ABNORMAL HIGH (ref 27.0–33.0)
MCHC: 36.2 g/dL — ABNORMAL HIGH (ref 32.0–36.0)
MCV: 97.9 fL (ref 80.0–100.0)
MPV: 11.7 fL (ref 7.5–12.5)
Platelets: 232 10*3/uL (ref 140–400)
RBC: 4.34 10*6/uL (ref 3.80–5.10)
RDW: 12.1 % (ref 11.0–15.0)
WBC: 6.1 10*3/uL (ref 3.8–10.8)

## 2022-03-30 LAB — HEMOGLOBIN A1C
Hgb A1c MFr Bld: 5.3 % of total Hgb (ref <5.7)
Mean Plasma Glucose: 105 mg/dL
eAG (mmol/L): 5.8 mmol/L

## 2022-03-30 LAB — MAGNESIUM: Magnesium: 2.1 mg/dL (ref 1.5–2.5)

## 2022-03-30 LAB — VITAMIN D 25 HYDROXY (VIT D DEFICIENCY, FRACTURES): Vit D, 25-Hydroxy: 32 ng/mL (ref 30–100)

## 2022-03-30 LAB — TSH: TSH: 1.48 mIU/L (ref 0.40–4.50)

## 2022-03-30 LAB — VITAMIN B12: Vitamin B-12: 865 pg/mL (ref 200–1100)

## 2022-03-30 NOTE — Progress Notes (Signed)
Chief Complaint:   OBESITY Kayla Rosales is here to discuss her progress with her obesity treatment plan along with follow-up of her obesity related diagnoses. Kayla Rosales is on the Category 2 Plan and states she is following her eating plan approximately 35% of the time. Kayla Rosales states she is not currently exercising.  Today's visit was #: 4 Starting weight: 181 lbs Starting date: 01/21/2022 Today's weight: 176 lbs Today's date: 03/18/2022 Total lbs lost to date: 5 Total lbs lost since last in-office visit: 1  Interim History: Kayla Rosales just returned from a beach vacation and Friendsgiving. She lost 1 lb. She acknowledges she could do better, particularly in the evenings. Kayla Rosales still has increased hunger and also cravings for carbs sometimes. She does not eat a filling meal in the morning and most calories are consumed in the afternoon and evenings.  Subjective:   1. Abnormal craving Symptoms in the presence of insulin resistance and elevated fasting blood glucose. May contribute to carb cravings.  May be also related to inadequate consumption of calories in the morning.  Assessment/Plan:   1. Abnormal craving After discussion of benefits and side effects and off label use, Kayla Rosales will start Metformin XR 500 mg once daily for incretin effect.  Start- metFORMIN (GLUCOPHAGE-XR) 500 MG 24 hr tablet; Take 1 tablet (500 mg total) by mouth daily with breakfast. (Patient not taking: Reported on 03/29/2022)  Dispense: 30 tablet; Refill: 0  2. Obesity with current BMI 30.2 Kayla Rosales is currently in the action stage of change. As such, her goal is to continue with weight loss efforts. She has agreed to the Category 2 Plan.   Work on Program[employer], multimedia. Kayla Rosales will start Metformin XR 500 mg a day for incretin effect and carb cravings. She has lost 9 lbs since September.  Exercise goals: All adults should avoid inactivity. Some physical activity is better than none, and adults who participate in any amount of  physical activity gain some health benefits.  Behavioral modification strategies: increasing lean protein intake, increasing water intake, no skipping meals, meal planning and cooking strategies, holiday eating strategies , avoiding temptations, and planning for success.  Kayla Rosales has agreed to follow-up with our clinic in 2-3 weeks. She was informed of the importance of frequent follow-up visits to maximize her success with intensive lifestyle modifications for her multiple health conditions.   Objective:   Blood pressure 123/72, pulse 71, temperature 98.1 F (36.7 C), height '5\' 4"'$  (1.626 m), weight 176 lb (79.8 kg), SpO2 97 %. Body mass index is 30.21 kg/m.  General: Cooperative, alert, well developed, in no acute distress. HEENT: Conjunctivae and lids unremarkable. Cardiovascular: Regular rhythm.  Lungs: Normal work of breathing. Neurologic: No focal deficits.   Lab Results  Component Value Date   CREATININE 0.61 03/29/2022   BUN 16 03/29/2022   NA 136 03/29/2022   K 4.2 03/29/2022   CL 102 03/29/2022   CO2 28 03/29/2022   Lab Results  Component Value Date   ALT 18 03/29/2022   AST 18 03/29/2022   ALKPHOS 75 12/27/2020   BILITOT 0.9 03/29/2022   Lab Results  Component Value Date   HGBA1C 5.3 03/29/2022   HGBA1C 5.3 09/12/2021   HGBA1C 5.4 03/19/2021   HGBA1C 5.1 12/09/2019   HGBA1C 5.4 08/17/2019   Lab Results  Component Value Date   INSULIN 12.9 01/21/2022   INSULIN 12.7 12/09/2019   INSULIN 20.0 08/17/2019   Lab Results  Component Value Date   TSH 1.48 03/29/2022  Lab Results  Component Value Date   CHOL 122 03/29/2022   HDL 38 (L) 03/29/2022   LDLCALC 57 03/29/2022   TRIG 209 (H) 03/29/2022   CHOLHDL 3.2 03/29/2022   Lab Results  Component Value Date   VD25OH 32 03/29/2022   VD25OH 31.53 03/19/2021   VD25OH 54.9 12/09/2019   Lab Results  Component Value Date   WBC 6.1 03/29/2022   HGB 15.4 03/29/2022   HCT 42.5 03/29/2022   MCV 97.9  03/29/2022   PLT 232 03/29/2022   Lab Results  Component Value Date   IRON 73 06/15/2019   TIBC 263 06/15/2019   FERRITIN 166 (H) 06/15/2019    Attestation Statements:   Reviewed by clinician on day of visit: allergies, medications, problem list, medical history, surgical history, family history, social history, and previous encounter notes.  Time spent on visit including pre-visit chart review and post-visit care and charting was 20 minutes.   I, Kathlene November, BS, CMA, am acting as transcriptionist for Thomes Dinning, MD.  I have reviewed the above documentation for accuracy and completeness, and I agree with the above. -Thomes Dinning, MD

## 2022-04-01 ENCOUNTER — Other Ambulatory Visit: Payer: Self-pay | Admitting: Family Medicine

## 2022-04-01 ENCOUNTER — Telehealth: Payer: Self-pay | Admitting: Family Medicine

## 2022-04-01 NOTE — Telephone Encounter (Signed)
Provided patient with lab results, however she had some concerns about some labs that were  elevated. Please give the patient a call to discuss.

## 2022-04-01 NOTE — Telephone Encounter (Signed)
Spoke with pt regarding labs and instructions.   

## 2022-04-03 ENCOUNTER — Ambulatory Visit (INDEPENDENT_AMBULATORY_CARE_PROVIDER_SITE_OTHER): Payer: Medicare Other

## 2022-04-03 ENCOUNTER — Other Ambulatory Visit (HOSPITAL_COMMUNITY)
Admission: RE | Admit: 2022-04-03 | Discharge: 2022-04-03 | Disposition: A | Discharge status: Home / Self Care | Payer: Medicare Other | Source: Ambulatory Visit | Attending: Obstetrics & Gynecology | Admitting: Obstetrics & Gynecology

## 2022-04-03 ENCOUNTER — Encounter: Payer: Self-pay | Admitting: Obstetrics & Gynecology

## 2022-04-03 ENCOUNTER — Ambulatory Visit (INDEPENDENT_AMBULATORY_CARE_PROVIDER_SITE_OTHER): Payer: Medicare Other | Admitting: Obstetrics & Gynecology

## 2022-04-03 VITALS — BP 124/80 | HR 82 | Ht 64.0 in | Wt 181.0 lb

## 2022-04-03 DIAGNOSIS — Z124 Encounter for screening for malignant neoplasm of cervix: Secondary | ICD-10-CM

## 2022-04-03 DIAGNOSIS — Z01419 Encounter for gynecological examination (general) (routine) without abnormal findings: Secondary | ICD-10-CM | POA: Diagnosis present

## 2022-04-03 DIAGNOSIS — B009 Herpesviral infection, unspecified: Secondary | ICD-10-CM

## 2022-04-03 DIAGNOSIS — Z0000 Encounter for general adult medical examination without abnormal findings: Secondary | ICD-10-CM | POA: Diagnosis not present

## 2022-04-03 DIAGNOSIS — M8589 Other specified disorders of bone density and structure, multiple sites: Secondary | ICD-10-CM | POA: Diagnosis not present

## 2022-04-03 DIAGNOSIS — Z780 Asymptomatic menopausal state: Secondary | ICD-10-CM

## 2022-04-03 DIAGNOSIS — Z9189 Other specified personal risk factors, not elsewhere classified: Secondary | ICD-10-CM | POA: Diagnosis not present

## 2022-04-03 NOTE — Patient Instructions (Signed)

## 2022-04-03 NOTE — Progress Notes (Signed)
Subjective:   Kayla Rosales is a 66 y.o. female who presents for Medicare Annual (Subsequent) preventive examination.  I connected with  Kayla Rosales on 04/03/22 by an audio only telemedicine application and verified that I am speaking with the correct person using two identifiers.   I discussed the limitations, risks, security and privacy concerns of performing an evaluation and management service by telephone and the availability of in person appointments. I also discussed with the patient that there may be a patient responsible charge related to this service. The patient expressed understanding and verbally consented to this telephonic visit.  Location of Patient: home Location of Provider: office  List any persons and their role that are participating in the visit with the patient.   [place], CMA  Review of Systems    Defer to PCP Cardiac Risk Factors include: advanced age (>51mn, >>22women)     Objective:    Today's Vitals   04/03/22 0823  PainSc: 2    There is no height or weight on file to calculate BMI.     04/03/2022    8:35 AM 02/01/2022    8:56 AM 03/19/2021    9:25 AM 05/20/2019    9:55 AM 01/26/2018    3:34 PM  Advanced Directives  Does Patient Have a Medical Advance Directive? No No No No No  Would patient like information on creating a medical advance directive? No - Patient declined No - Patient declined  No - Patient declined     Current Medications (verified) Outpatient Encounter Medications as of 04/03/2022  Medication Sig   albuterol (VENTOLIN HFA) 108 (90 Base) MCG/ACT inhaler TAKE 2 PUFFS BY MOUTH EVERY 6 HOURS AS NEEDED FOR WHEEZE OR SHORTNESS OF BREATH   aspirin EC 81 MG tablet    atorvastatin (LIPITOR) 20 MG tablet Take 1 tablet (20 mg total) by mouth daily.   cetirizine (ZYRTEC) 5 MG tablet Take 5 mg by mouth daily.   Cholecalciferol 25 MCG (1000 UT) capsule Take 1,000 Units by mouth daily.   cyanocobalamin 1000 MCG tablet  Take 500 mcg by mouth daily.   esomeprazole (NEXIUM) 40 MG capsule Take 1 capsule (40 mg total) by mouth daily at 12 noon.   fluticasone (FLONASE) 50 MCG/ACT nasal spray Place into both nostrils daily.   Magnesium 250 MG TABS Take 1 tablet by mouth daily.   metFORMIN (GLUCOPHAGE-XR) 500 MG 24 hr tablet Take 1 tablet (500 mg total) by mouth daily with breakfast. (Patient not taking: Reported on 03/29/2022)   Multiple Vitamins-Minerals (ALIVE WOMENS 50+ PO) Take by mouth daily.   polyethylene glycol powder (GLYCOLAX/MIRALAX) 17 GM/SCOOP powder Take by mouth.   Probiotic Product (PROBIOTIC ADVANCED) CAPS daily.    Wheat Dextrin (BENEFIBER) POWD Take 5 mg by mouth daily.   No facility-administered encounter medications on file as of 04/03/2022.    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 Still unclear what is cuasing her pain. May be nerve related. May benefit from starting on gabapentin. Start gabapentin 300 mg TID. F/u 4 weeks   Night sweats    Palpitations    Recurrent upper respiratory infection (URI)    Right arm weakness 09/28/2015   Last Assessment & Plan:  Formatting of this note might be different from the original. Referral to PT today. 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    Vertigo 08/11/2015   Last Assessment & Plan:  Formatting of this note might be different from the original. Concern given gradual onset over the last week and a half in addition to neck pain. Patient needs neuroimaging to rule out dissecting vertebral aneurysm. Patient advised to go to emergency room. She declined ambulance transport   Vitamin B 12 deficiency    Vitamin D deficiency    Past Surgical History:  Procedure Laterality Date   CARPAL TUNNEL RELEASE     COLONOSCOPY  2015   [name]- [place], [place]; 5 yr repeat recommended   COLONOSCOPY W/ POLYPECTOMY  05/26/2018   x5 polyps   KNEE ARTHROSCOPY Bilateral 2013   meniscus  x2   NECK SURGERY  2018   disc fusion   UPPER GASTROINTESTINAL ENDOSCOPY     Family History  Problem Relation Age of Onset   Depression Mother    Heart disease Mother    Early death Mother 61   Hypertension Mother    Thyroid disease Mother    Anxiety disorder Mother    Obesity Mother    Arthritis Father    Heart disease Father    Hypertension Father    Stroke Father    Depression Sister 47   Early death Sister    Drug abuse Sister    Lung cancer Sister    Mental illness Sister    COPD Sister    Diabetes Maternal Grandmother    Early death Maternal Grandmother    Early death Maternal Grandfather    Heart disease Maternal Grandfather    Breast cancer Cousin    Colon cancer Neg Hx    Esophageal cancer Neg Hx    Stomach cancer Neg Hx    Pancreatic cancer Neg Hx    Rectal cancer Neg Hx    Social History   Socioeconomic History   Marital status: Married    Spouse name: Kayla Rosales   Number of children: 3   Years of education: Not on file   Highest education level: Bachelor's degree (e.g., BA, AB, BS)  Occupational History   Occupation: retired Pharmacist, hospital  Tobacco Use   Smoking status: Never    Passive exposure:  Never   Smokeless tobacco: Never  Vaping Use   Vaping Use: Never used  Substance and Sexual Activity   Alcohol use: Yes    Alcohol/week: 1.0 standard drink of alcohol    Types: 1 Glasses of wine per week   Drug use: Never   Sexual activity: Yes    Partners: Male    Birth control/protection: Post-menopausal    Comment: married,intercourse  age 63, less than 5 sexual partners,des neg  Other Topics Concern   Not on file  Social History Narrative   Married second family - 53 kids 56 grandkids blended   Haematologist, retired Pharmacist, hospital   Caffeine 2/day   Smoker - never   EtOH - rare   No drugs   Wears her seatbelt, smoke alarm at home.   Feels safe in her relationships.   Social Determinants of Health   Financial Resource Strain: Low Risk  (04/03/2022)   Overall Financial Resource Strain (CARDIA)    Difficulty of Paying Living Expenses: Not hard at all  Food Insecurity: No Food Insecurity (04/03/2022)   Hunger Vital Sign    Worried About Running Out of Food in the Last Year: Never true    Ran Out of Food in the Last Year: Never true  Transportation Needs: No Transportation Needs (04/03/2022)   PRAPARE - Hydrologist (Medical): No    Lack of Transportation (Non-Medical): No  Physical Activity: Inactive (04/03/2022)   Exercise Vital Sign    Days of Exercise per Week: 0 days    Minutes of Exercise per Session: 0 min  Stress: No Stress Concern Present (04/03/2022)   [place]    Feeling of Stress : Not at all  Social Connections: [place] (04/03/2022)   Social Connection and Isolation Panel [NHANES]    Frequency of Communication with Friends and Family: More than three times a week    Frequency of Social Gatherings with Friends and Family: Twice a week    Attends Religious Services: More than 4 times per year    Active Member of [employer] or Organizations: Yes    Attends Arts development officer: More than 4 times per year    Marital Status: Married    Tobacco Counseling Counseling given: Not Answered   Clinical Intake:     Pain : 0-10 Pain Score: 2  Pain Location: Knee     Nutritional Risks: None  How often do you need to have someone help you when you read instructions, pamphlets, or other written materials from your doctor or pharmacy?: 1 - Never  Diabetic?no         Activities of Daily Living    04/03/2022    8:24 AM  In your present state of health, do you have any difficulty performing the following activities:  Hearing? 0  Vision? 0  Difficulty concentrating or making decisions? 0  Walking or climbing stairs? 1  Comment knee problem  Dressing or bathing? 0  Doing errands, shopping? 0  Preparing Food and eating ? N  Using the Toilet? N  In the past six months, have you accidently leaked urine? Y  Comment when coughing  Do you have problems with loss of bowel control? N  Managing your Medications? N  Managing your Finances? N  Housekeeping or managing your Housekeeping? N    Patient Care Team: Ma Hillock, DO as PCP - General (Family Medicine) Elouise Munroe, MD as PCP - Cardiology (Cardiology) Gatha Mayer, MD as Consulting Physician (Gastroenterology) Princess Bruins, MD as Consulting Physician (Obstetrics and Gynecology)  Indicate any recent Medical Services you may have received from other than [facility] providers in the past year (date may be approximate).     Assessment:   This is a routine wellness examination for Kayla Rosales.  Hearing/Vision screen No results found.  Dietary issues and exercise  activities discussed: Current Exercise Habits: The patient does not participate in regular exercise at present, Exercise limited by: None identified   Goals Addressed             This Visit's Progress    Quality of Life Maintained       Evidence-based guidance:  Assess patient's thoughts about  quality of life, goals and expectations, and dissatisfaction or desire to improve.  Identify issues of primary importance such as mental health, illness, exercise tolerance, pain, sexual function and intimacy, cognitive change, social isolation, finances and relationships.  Assess and monitor for signs/symptoms of psychosocial concerns, especially depression or ideations regarding harm to others or self; provide or refer for mental health services as needed.  Identify sensory issues that impact quality of life such as hearing loss, vision deficit; strategize ways to maintain or improve hearing, vision.  Promote access to services in the community to support independence such as support groups, home visiting programs, financial assistance, handicapped parking tags, durable medical equipment and emergency responder.  Promote activities to decrease social isolation such as group support or social, leisure and recreational activities, employment, use of social media; consider safety concerns about being out of home for activities.  Provide patient an opportunity to share by storytelling or a "life review" to give positive meaning to life and to assist with coping and negative experiences.  Encourage patient to tap into hope to improve sense of self.  Counsel based on prognosis and as early as possible about end-of-life and palliative care; consider referral to palliative care provider.  Advocate for the development of palliative care plan that may include avoidance of unnecessary testing and intervention, symptom control, discontinuation of medications, hospice and organ donation.  Counsel as early as possible those with life-limiting chronic disease about palliative care; consider referral to palliative care provider.  Advocate for the development of palliative care plan.   Notes:       Depression Screen    04/03/2022    8:25 AM 01/21/2022    8:28 AM 03/19/2021    9:31 AM 10/24/2020    8:53 AM  04/03/2020   10:40 AM 08/17/2019    8:29 AM 05/20/2019    3:01 PM  PHQ 2/9 Scores  PHQ - 2 Score 0 4 0 0 0 2 0  PHQ- 9 Score  11 2 0 6 13     Fall Risk    04/03/2022    8:27 AM 05/21/2021    8:26 PM 03/19/2021    9:26 AM 02/12/2021    2:44 PM 04/03/2020   10:39 AM  Fall Risk   Falls in the past year? 0 0 0 0 0  Number falls in past yr: 0  0 0 0  Injury with Fall? 0  0 0 0  Risk for fall due to :   No Fall Risks No Fall Risks   Follow up Falls evaluation completed  Falls evaluation completed Falls evaluation completed Falls evaluation completed    [place]:  Any stairs in or around the home? Yes  If so, are there any without handrails? Yes  Home free of loose throw rugs in walkways, pet beds, electrical cords, etc? Yes  Adequate lighting in your home to reduce risk of falls? Yes   ASSISTIVE DEVICES UTILIZED TO PREVENT FALLS:  Life alert? No  Use of a cane, walker or w/c? No  Grab bars in the bathroom? No  Shower chair or bench  in shower? No  Elevated toilet seat or a handicapped toilet? No   TIMED UP AND GO:  Was the test performed? No .  Length of time to ambulate 10 feet: n/a sec.     Cognitive Function:        04/03/2022    8:34 AM 03/19/2021    9:26 AM  6CIT Screen  What Year? 0 points 0 points  What month? 0 points 0 points  What time? 0 points 0 points  Count back from 20 0 points 0 points  Months in reverse 0 points 0 points  Repeat phrase 0 points 0 points  Total Score 0 points 0 points    Immunizations Immunization History  Administered Date(s) Administered   Fluad Quad(high Dose 65+) 03/27/2020, 03/19/2021, 03/29/2022   Influenza Inj Mdck Quad Pf 03/27/2017   Influenza, High Dose Seasonal PF 01/15/2018   Influenza, Quadrivalent, Recombinant, Inj, Pf 02/16/2019   Influenza, Seasonal, Injecte, Preservative Fre 03/27/2017   Influenza,inj,Quad PF,6+ Mos 02/16/2019   Influenza-Unspecified 01/21/2016, 02/16/2019    Moderna Covid-19 Vaccine Bivalent Booster 54yr & up 03/30/2021   Moderna Sars-Covid-2 Vaccination 06/10/2019, 07/09/2019, 03/02/2020   PNEUMOCOCCAL CONJUGATE-20 03/29/2022   Pneumococcal Conjugate-13 04/03/2020   Pneumococcal Polysaccharide-23 08/13/2015   Tdap 12/30/2011, 01/21/2016   Zoster Recombinat (Shingrix) 02/16/2019, 04/22/2019    TDAP status: Up to date  Flu Vaccine status: Up to date  Pneumococcal vaccine status: Up to date  Covid-19 vaccine status: Completed vaccines  Qualifies for Shingles Vaccine? Yes   Zostavax completed No   Shingrix Completed?: Yes  Screening Tests Health Maintenance  Topic Date Due   Medicare Annual Wellness (AWV)  04/04/2023   MAMMOGRAM  05/23/2023   COLONOSCOPY (Pts 45-453yrInsurance coverage will need to be confirmed)  02/02/2024   DEXA SCAN  05/23/2025   DTaP/Tdap/Td (3 - Td or Tdap) 01/20/2026   Pneumonia Vaccine 6551Years old  Completed   INFLUENZA VACCINE  Completed   Hepatitis C Screening  Completed   Zoster Vaccines- Shingrix  Completed   HPV VACCINES  Aged Out   COVID-19 Vaccine  Discontinued    Health Maintenance  There are no preventive care reminders to display for this patient.   Colorectal cancer screening: Type of screening: Colonoscopy. Completed 02/01/21. Repeat every 3 years  Mammogram status: Completed 05/22/21. Repeat every year  Bone Density status: Completed 05/23/20. Results reflect: Bone density results: OSTEOPENIA. Repeat every 2 years.  Lung Cancer Screening: (Low Dose CT Chest recommended if Age 67-80ears, 30 pack-year currently smoking OR have quit w/in 15years.) does not qualify.   Lung Cancer Screening Referral: n/a  Additional Screening:  Hepatitis C Screening: does qualify; Completed 12/28/2018  Vision Screening: Recommended annual ophthalmology exams for early detection of glaucoma and other disorders of the eye. Is the patient up to date with their annual eye exam?  Yes  Who is the  provider or what is the name of the office in which the patient attends annual eye exams? N/a If pt is not established with a provider, would they like to be referred to a provider to establish care? No .   Dental Screening: Recommended annual dental exams for proper oral hygiene  Community Resource Referral / Chronic Care Management: CRR required this visit?  No   CCM required this visit?  No      Plan:     I have personally reviewed and noted the following in the patient's chart:   Medical and social history Use of  alcohol, tobacco or illicit drugs  Current medications and supplements including opioid prescriptions. Patient is not currently taking opioid prescriptions. Functional ability and status Nutritional status Physical activity Advanced directives List of other physicians Hospitalizations, surgeries, and ER visits in previous 12 months Vitals Screenings to include cognitive, depression, and falls Referrals and appointments  In addition, I have reviewed and discussed with patient certain preventive protocols, quality metrics, and best practice recommendations. A written personalized care plan for preventive services as well as general preventive health recommendations were provided to patient.     Kayla Rosales, [place]   04/03/2022   Nurse Notes: Non-Face to Face or Face to Face 6 minute visit Encounter   Kayla Rosales , Thank you for taking time to come for your Medicare Wellness Visit. I appreciate your ongoing commitment to your health goals. Please review the following plan we discussed and let me know if I can assist you in the future.   These are the goals we discussed:  Goals      Quality of Life Maintained     Evidence-based guidance:  Assess patient's thoughts about quality of life, goals and expectations, and dissatisfaction or desire to improve.  Identify issues of primary importance such as mental health, illness, exercise tolerance, pain, sexual function  and intimacy, cognitive change, social isolation, finances and relationships.  Assess and monitor for signs/symptoms of psychosocial concerns, especially depression or ideations regarding harm to others or self; provide or refer for mental health services as needed.  Identify sensory issues that impact quality of life such as hearing loss, vision deficit; strategize ways to maintain or improve hearing, vision.  Promote access to services in the community to support independence such as support groups, home visiting programs, financial assistance, handicapped parking tags, durable medical equipment and emergency responder.  Promote activities to decrease social isolation such as group support or social, leisure and recreational activities, employment, use of social media; consider safety concerns about being out of home for activities.  Provide patient an opportunity to share by storytelling or a "life review" to give positive meaning to life and to assist with coping and negative experiences.  Encourage patient to tap into hope to improve sense of self.  Counsel based on prognosis and as early as possible about end-of-life and palliative care; consider referral to palliative care provider.  Advocate for the development of palliative care plan that may include avoidance of unnecessary testing and intervention, symptom control, discontinuation of medications, hospice and organ donation.  Counsel as early as possible those with life-limiting chronic disease about palliative care; consider referral to palliative care provider.  Advocate for the development of palliative care plan.   Notes:         This is a list of the screening recommended for you and due dates:  Health Maintenance  Topic Date Due   Medicare Annual Wellness Visit  04/04/2023   Mammogram  05/23/2023   Colon Cancer Screening  02/02/2024   DEXA scan (bone density measurement)  05/23/2025   DTaP/Tdap/Td vaccine (3 - Td or Tdap)  01/20/2026   Pneumonia Vaccine  Completed   Flu Shot  Completed   Hepatitis C Screening: USPSTF Recommendation to screen - Ages 38-79 yo.  Completed   Zoster (Shingles) Vaccine  Completed   HPV Vaccine  Aged Out   COVID-19 Vaccine  Discontinued

## 2022-04-03 NOTE — Progress Notes (Signed)
Kayla Rosales 1956-03-19 [id number]   History:    66 y.o. G4P3A1L3 Remarried x 9 years.  Husband has 5 sons.  Many grand-children both sides.   RP:  Established patient presenting for annual gyn exam    HPI: Early menopause in her 19s, on no HRT.  No PMB.  Had a stroke on HRT 5 yrs ago and stopped hormones since then.  Good recovery from stroke.  No pelvic pain.  No pain with intercourse.  Pap Neg in 03/2021.  Urine normal/BMs normal. Colono 01/2021.  Precancer Polyps removed, twisted Colon. Followed by Gertie Fey.Breasts normal.  Mammo Neg 05/2021.  Body mass index at 31.07.  Health labs with family physician.  BD Osteopenia T-Score -1.7 in 05/2020.    Past medical history,surgical history, family history and social history were all reviewed and documented in the EPIC chart.  Gynecologic History No LMP recorded. Patient is postmenopausal.  Obstetric History OB History  Gravida Para Term Preterm AB Living  '4 3   1 1 3  '$ SAB IAB Ectopic Multiple Live Births      0        # Outcome Date GA Lbr Len/2nd Weight Sex Delivery Anes PTL Lv  4 AB           3 Para           2 Para           1 Preterm             ROS: A ROS was performed and pertinent positives and negatives are included in the history. GENERAL: No fevers or chills. HEENT: No change in vision, no earache, sore throat or sinus congestion. NECK: No pain or stiffness. CARDIOVASCULAR: No chest pain or pressure. No palpitations. PULMONARY: No shortness of breath, cough or wheeze. GASTROINTESTINAL: No abdominal pain, nausea, vomiting or diarrhea, melena or bright red blood per rectum. GENITOURINARY: No urinary frequency, urgency, hesitancy or dysuria. MUSCULOSKELETAL: No joint or muscle pain, no back pain, no recent trauma. DERMATOLOGIC: No rash, no itching, no lesions. ENDOCRINE: No polyuria, polydipsia, no heat or cold intolerance. No recent change in weight. HEMATOLOGICAL: No anemia or easy bruising or bleeding. NEUROLOGIC: No headache,  seizures, numbness, tingling or weakness. PSYCHIATRIC: No depression, no loss of interest in normal activity or change in sleep pattern.     Exam:   BP 124/80 (BP Location: Right Arm, Patient Position: Sitting, Cuff Size: Normal)   Pulse 82   Ht '5\' 4"'$  (1.626 m)   Wt 181 lb (82.1 kg)   BMI 31.07 kg/m   Body mass index is 31.07 kg/m.  General appearance : Well developed well nourished female. No acute distress HEENT: Eyes: no retinal hemorrhage or exudates,  Neck supple, trachea midline, no carotid bruits, no thyroidmegaly Lungs: Clear to auscultation, no rhonchi or wheezes, or rib retractions  Heart: Regular rate and rhythm, no murmurs or gallops Breast:Examined in sitting and supine position were symmetrical in appearance, no palpable masses or tenderness,  no skin retraction, no nipple inversion, no nipple discharge, no skin discoloration, no axillary or supraclavicular lymphadenopathy Abdomen: no palpable masses or tenderness, no rebound or guarding Extremities: no edema or skin discoloration or tenderness  Pelvic: Vulva: Normal             Vagina: No gross lesions or discharge  Cervix: No gross lesions or discharge.  Pap reflex done.  Uterus  AV, normal size, shape and consistency, non-tender and mobile  Adnexa  Without masses or tenderness  Anus: Normal   Assessment/Plan:  66 y.o. female for annual exam   1. Encounter for routine gynecological examination with Papanicolaou smear of cervix Early menopause in her 68s, on no HRT.  No PMB.  Had a stroke on HRT 5 yrs ago and stopped hormones since then.  Good recovery from stroke.  No pelvic pain.  No pain with intercourse.  Pap Neg in 03/2021.  Urine normal/BMs normal. Colono 01/2021.  Precancer Polyps removed, twisted Colon. Followed by Gertie Fey.Breasts normal.  Mammo Neg 05/2021.  Body mass index at 31.07.  Health labs with family physician.  BD Osteopenia T-Score -1.7 in 05/2020.   - Cytology - PAP( [place])  2.  Postmenopausal Early menopause in her 71s, on no HRT.  No PMB.  Had a stroke on HRT 5 yrs ago and stopped hormones since then.  Good recovery from stroke.  No pelvic pain.  No pain with intercourse.   3. Osteopenia of multiple sites   BD Osteopenia T-Score -1.7 in 05/2020.  Repeat BD in 2024.  Princess Bruins MD, 3:31 PM

## 2022-04-04 ENCOUNTER — Ambulatory Visit (INDEPENDENT_AMBULATORY_CARE_PROVIDER_SITE_OTHER): Payer: Medicare Other | Admitting: Physician Assistant

## 2022-04-04 ENCOUNTER — Other Ambulatory Visit (INDEPENDENT_AMBULATORY_CARE_PROVIDER_SITE_OTHER): Payer: Self-pay | Admitting: Internal Medicine

## 2022-04-04 DIAGNOSIS — R638 Other symptoms and signs concerning food and fluid intake: Secondary | ICD-10-CM

## 2022-04-05 ENCOUNTER — Ambulatory Visit: Payer: Medicare Other | Admitting: Obstetrics & Gynecology

## 2022-04-09 LAB — CYTOLOGY - PAP: Diagnosis: NEGATIVE

## 2022-04-10 ENCOUNTER — Ambulatory Visit: Payer: Medicare Other | Admitting: Physical Therapy

## 2022-04-11 ENCOUNTER — Encounter: Payer: Self-pay | Admitting: Family Medicine

## 2022-04-16 ENCOUNTER — Encounter: Payer: Self-pay | Admitting: Family Medicine

## 2022-04-16 DIAGNOSIS — Z1389 Encounter for screening for other disorder: Secondary | ICD-10-CM

## 2022-04-16 DIAGNOSIS — Z1283 Encounter for screening for malignant neoplasm of skin: Secondary | ICD-10-CM

## 2022-04-17 ENCOUNTER — Ambulatory Visit: Payer: Medicare Other | Attending: Orthopaedic Surgery | Admitting: Rehabilitative and Restorative Service Providers"

## 2022-04-17 ENCOUNTER — Encounter: Payer: Self-pay | Admitting: Rehabilitative and Restorative Service Providers"

## 2022-04-17 DIAGNOSIS — M25562 Pain in left knee: Secondary | ICD-10-CM | POA: Diagnosis not present

## 2022-04-17 DIAGNOSIS — G8929 Other chronic pain: Secondary | ICD-10-CM | POA: Insufficient documentation

## 2022-04-17 DIAGNOSIS — R262 Difficulty in walking, not elsewhere classified: Secondary | ICD-10-CM | POA: Insufficient documentation

## 2022-04-17 DIAGNOSIS — M6281 Muscle weakness (generalized): Secondary | ICD-10-CM | POA: Diagnosis present

## 2022-04-17 NOTE — Telephone Encounter (Signed)
Ok to place referral for a skin check.  There is not a dermatologist in the  "[facility]" system.

## 2022-04-17 NOTE — Therapy (Signed)
OUTPATIENT PHYSICAL THERAPY TREATMENT NOTE    Patient Name: OLA RAAP MRN: [id number] DOB:06/04/1955, 67 y.o., female Today's Date: 04/17/2022       PT End of Session - 04/17/22 1018     Visit Number 11    Date for PT Re-Evaluation 05/24/22    Authorization Type Medicare/[employer]    Progress Note Due on Visit 92    PT Start Time 1016    PT Stop Time 1055    PT Time Calculation (min) 39 min    Activity Tolerance Patient tolerated treatment well    Behavior During Therapy [facility] for tasks assessed/performed               Past Medical History:  Diagnosis Date   Allergy    Arthritis    Asthma    Back pain    Benign positional vertigo    Bilateral swelling of feet    Bronchitis    Chicken pox    Colon polyps    Constipation    Cough 10/09/2020   COVID-19 10/09/2020   Diverticulitis    Early menopause occurring in patient age younger than 51 years    Fibromyalgia    GERD (gastroesophageal reflux disease)    Gluten intolerance    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 Still unclear what is cuasing her pain. May be nerve related. May benefit from starting on gabapentin. Start gabapentin 300 mg TID. F/u 4 weeks   Night sweats    Palpitations    Recurrent upper respiratory infection (URI)    Right arm weakness 09/28/2015   Last Assessment & Plan:  Formatting of this note might be different from the original. Referral to PT today. Suspect residual after stroke in left basal ganglia   Sleep apnea    Snoring 01/04/2019   Stroke (cerebrum) ([place]) 2017   right UE/neck sx, resolved.    Vertigo 08/11/2015   Last Assessment & Plan:  Formatting of this note might be different from the original. Concern given gradual onset over the last week and a half  in addition to neck pain. Patient needs neuroimaging to rule out dissecting vertebral aneurysm. Patient advised to go to emergency room. She declined ambulance transport   Vitamin B 12 deficiency    Vitamin D deficiency    Past Surgical History:  Procedure Laterality Date   CARPAL TUNNEL RELEASE     COLONOSCOPY  2015   [name]- [facility], [place]; 5 yr repeat recommended   COLONOSCOPY W/ POLYPECTOMY  05/26/2018   x5 polyps   KNEE ARTHROSCOPY Bilateral 2013   meniscus  x2   NECK SURGERY  2018   disc fusion   UPPER GASTROINTESTINAL ENDOSCOPY     Patient Active Problem List   Diagnosis Date Noted   Vitamin D deficiency 01/21/2022   Other tear of medial meniscus, current injury, left knee, subsequent encounter 01/14/2022   Other adverse food reactions, not elsewhere classified, subsequent encounter 56/81/2751   Diastolic dysfunction 70/04/7492   Heart murmur 02/16/2019   Hyperlipidemia LDL goal <100 02/16/2019   Class 1 obesity with serious comorbidity and body mass index (BMI) of 30.0 to 30.9 in adult 01/04/2019   Pelvic floor dysfunction in female 12/06/2018   Constipation  due to outlet dysfunction 12/06/2018   Lactose intolerance 12/06/2018   Diverticulosis 05/27/2018   Osteopenia 01/19/2018   Irritable bowel syndrome with constipation 01/17/2018   History of colonic polyp 01/17/2018   History of stroke 01/15/2018   GERD (gastroesophageal reflux disease)    Asthma    S/P cervical spinal fusion 09/29/2017   Macrocytosis without anemia 11/25/2015   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   Heart palpitations 09/08/2015   Vitamin B12 deficiency 04/06/2014   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   Vestibular neuritis 10/15/2006   Family history of malignant neoplasm of breast 03/18/2006   Diaphragmatic hernia 03/18/2006   Premature menopause 03/18/2006    PCP: Ma Hillock, DO  REFERRING PROVIDER: Leandrew Koyanagi, MD   REFERRING DIAG: [phone] (ICD-10-CM) - Primary  osteoarthritis of left knee   THERAPY DIAG:  Chronic pain of left knee  Difficulty in walking, not elsewhere classified  Muscle weakness (generalized)  Rationale for Evaluation and Treatment Rehabilitation  ONSET DATE: 01/07/2022  SUBJECTIVE:   SUBJECTIVE STATEMENT: Pt reports that she is still having some stiffness in her knee, but overall, it is feeling better.  PERTINENT HISTORY: CVA, allergies, osteopenia, diverticulosis, Hx of cervical fusion, vestibular neuritis/BPPV, lumbar herniated disc, sleep apnea PAIN:  Are you having pain? Yes: NPRS scale: 4/10 Pain location: left knee Pain description: sore Aggravating factors: movement Relieving factors: rest  PRECAUTIONS: None  WEIGHT BEARING RESTRICTIONS: No  FALLS:  Has patient fallen in last 6 months? No  LIVING ENVIRONMENT: Lives with: lives with their spouse Lives in: House/apartment Stairs: Yes: Internal: 15 steps; on left going up and External: 2 steps; none Has following equipment at home: None  OCCUPATION: Retired Education officer, museum  PLOF: Independent and Leisure: playing with grandchildren, sewing, gardening  PATIENT GOALS: To be able to walk and get some weight off and enjoy my grandchildren and go on hikes if I want to.  OBJECTIVE:   DIAGNOSTIC FINDINGS:  MRI of left knee on 01/24/22: IMPRESSION: 1. Severe attenuation of the body of the medial meniscus which may reflect prior meniscectomy versus maceration. Degeneration of the remainder of the anterior horn and posterior horns of the medial meniscus. Small oblique tear of the posterior horn of the medial meniscus extending to the superior articular surface. 2. Complex signal in the posterior horn of the lateral meniscus concerning for a tear. 3. Cartilage fissuring of the patellar apex. Partial-thickness cartilage loss of the patellofemoral compartment most severe in the medial patellofemoral compartment. 4. Extensive full-thickness cartilage loss of  the medial femorotibial compartment with subchondral reactive marrow edema in the medial tibial plateau.  PATIENT SURVEYS:  Eval:  FOTO 40% (projected 56% by visit 15) 03/26/2022:  FOTO 52%  COGNITION: Overall cognitive status: Within functional limits for tasks assessed     SENSATION: Reports some numbness/tingling down left LE  EDEMA:  Circumferential: right 43 cm, left 43.5 cm  MUSCLE LENGTH: Bilat hamstring tightness noted  POSTURE: rounded shoulders  PALPATION: Tenderness to palpation along right knee medial joint line  LOWER EXTREMITY ROM:  WFL  LOWER EXTREMITY MMT:  Eval:  Bilateral hips 4/5, Left quad/hamstring 4/5, right quad/hamstring 5/5 03/26/2022:  Bilateral hip strength of 4 to 4+/5, Left quad/hamstrings 4 to 4+/5  LOWER EXTREMITY SPECIAL TESTS:  Knee special tests: Anterior drawer test: negative, Posterior drawer test: positive , and Patellafemoral grind test: negative  FUNCTIONAL TESTS:  Eval:   5 times sit to stand: 20.3 sec with pushing up from thighs  02/19/2022: 3  Minute Walk Test:  030 ft with pain going up to 7/10 towards end of ambulation  02/27/2022: 5 times sit to stand: 13.1 sec with pushing up from thighs  03/26/2022: 3 Minute Walk Test: 714 ft with pain of 3/10 at end of ambulation 5 times sit to stand: 10.9 sec without UE use, but did use rocking momentum  GAIT: Distance walked: >100 ft Assistive device utilized: None Level of assistance: Complete Independence Comments: Antalgic gait pattern   TODAY'S TREATMENT:  DATE: 04/17/2022 NuStep: Level 5x 7 minutes-PT present to discuss progress Seated with 3#:  marching, LAQ with hold at TKE, heel/toe raises, hip abduction scissors.  2x10 bilat each Seated hamstring stretch 2x20 seconds bilat Ambulation around PT clinic with 3# ankle weights High marching 6 x 10 ft with 3# ankle weights Standing with 3# hip abduction and hip extension 2x10 bilat Standing calf stretch 2x20 sec bilat  on slanted rocker board Wall squats 2x10   03/27/22:Pt arrives for aquatic physical therapy. Treatment took place in 3.5-5.5 feet of water. Water temperature was 91 degrees F.. Pt entered the pool via stairs step to step with moderate use of the rails. Pt requires buoyancy of water for support and to offload joints with strengthening exercises.  Seated water bench with 75% submersion and discussion of current status. Pt performed seated LE AROM exercises 20x in all planes. 75% depth water walking with hand help single buoy wts 8 lengths each with short rest break to analyze knee before changing direction. Standing hip flex/ext , abduction, and circles 15x Bil with some UE on pool wall for balance added ankle fins for resistance. Core press with double bouy 20x. Single leg stance 10 sec no UE static 1x, added UE movements 20x sagittal plane together then reciprocally Bil. Standing knee extension Bil with small noodle 10x Bil. Standing high knee marching holding onto yellow noodle 20x . Yellow noodle LE decompression x 3 min at end of session.    DATE: 03/26/2022 NuStep: Level 5x 7 minutes-PT present to discuss progress Seated with 2#:  marching, LAQ with hold at [facility], heel/toe raises, hip abduction scissors.  2x10 bilat each 3 minute walk test:  714 ft Sit to/from stand x5 Seated hamstring stretch 2x20 seconds bilat Standing hip abduction and hip extension 2x10 bilat Standing calf stretch 2x20 sec bilat    PATIENT EDUCATION:  Education details: Issued HEP Person educated: Patient Education method: Explanation, Demonstration, and Handouts Education comprehension: verbalized understanding and returned demonstration  HOME EXERCISE PROGRAM: Access Code: [email or web address] URL: [email or web address] Date: 04/17/2022 Prepared by: Shelby Dubin Hilton Saephan  Exercises - Seated Hamstring Stretch  - 1 x daily - 7 x weekly - 1 sets - 2 reps - 20 sec hold - Seated Piriformis Stretch  - 1 x daily - 7 x  weekly - 1 sets - 2 reps - 20 sec hold - Seated Long Arc Quad  - 1 x daily - 7 x weekly - 2 sets - 10 reps - Seated March  - 1 x daily - 7 x weekly - 2 sets - 10 reps - Seated Quad Set  - 1 x daily - 7 x weekly - 2 sets - 10 reps - Wall Squat  - 1 x daily - 7 x weekly - 2 sets - 10 reps - Standing Hip Abduction with Counter Support  - 1 x daily - 7 x weekly - 2 sets - 10 reps - Standing Hip Extension with Counter Support  - 1 x daily -  7 x weekly - 2 sets - 10 reps - Standing March with Counter Support  - 1 x daily - 7 x weekly - 2 sets - 10 reps  ASSESSMENT:  CLINICAL IMPRESSION:  Ms Drennen arrives to skilled PT after having a slight break secondary to holidays and having to care for her grandchildren.  Patient was able to progress with exercises and updated HEP.  Patient requires some cuing during newer exercises for technique and posture.  Patient continues to require skilled PT to progress towards goal related activities.  OBJECTIVE IMPAIRMENTS: decreased balance, difficulty walking, decreased strength, impaired flexibility, and pain.   ACTIVITY LIMITATIONS: lifting, bending, squatting, stairs, and transfers  PARTICIPATION LIMITATIONS: cleaning, community activity, and yard work  PERSONAL FACTORS: Past/current experiences and 3+ comorbidities: CVA, vertigo, OA  are also affecting patient's functional outcome.   REHAB POTENTIAL: Good  CLINICAL DECISION MAKING: Stable/uncomplicated  EVALUATION COMPLEXITY: Moderate   GOALS: Goals reviewed with patient? Yes  SHORT TERM GOALS: Target date: 02/22/2022  Pt will be independent with initial HEP. Baseline: Goal status: MET on 02/27/2022  2.  Pt will decreased time with 5 times sit to/from stand to 15 sec or less to demonstrate improved functional strength. Baseline: 20.3 sec Goal status: MET on 02/27/2022   LONG TERM GOALS: Target date: 03/29/2022   Pt will be independent with advanced HEP. Baseline:  Goal status: IN  PROGRESS  2.  Pt will increase FOTO to at least 56% to demonstrate improvements in functional mobility. Baseline: 40% Goal status: IN PROGRESS  3.  Pt will increase bilateral hip and left knee strength to at least 4+ to 5/5 to allow her to navigate stairs in home with increased ease. Baseline:  Goal status: IN PROGRESS  4.  Pt will report ability to walk for greater than 20 minutes without an increase in knee pain to allow pt to return to walking with her family. Baseline:  Goal status: IN PROGRESS (ambulated for 2 miles over Thanksgiving and had increased pain noted)  5.  Pt will deny any dizziness during typical functional tasks. Baseline:  Goal status: IN PROGRESS (no dizziness reported on 03/26/22)   PLAN:  PT FREQUENCY: 2x/week  PT DURATION: 8 weeks  PLANNED INTERVENTIONS: Therapeutic exercises, Therapeutic activity, Neuromuscular re-education, Balance training, Gait training, Patient/Family education, Self Care, Joint mobilization, Joint manipulation, Stair training, Vestibular training, Canalith repositioning, Aquatic Therapy, Dry Needling, Electrical stimulation, Spinal manipulation, Spinal mobilization, Cryotherapy, Moist heat, Taping, Vasopneumatic device, Ultrasound, Ionotophoresis 76m/ml Dexamethasone, Manual therapy, and Re-evaluation  PLAN FOR NEXT SESSION: aquatic PT   SJuel Burrow PT 04/17/22 11:03 AM   [id number] [place] [place] [place] [id number] # [phone] [phone]

## 2022-04-17 NOTE — Telephone Encounter (Signed)
Please advise on referral

## 2022-04-18 NOTE — Addendum Note (Signed)
Addended by: Beatrix Fetters on: 04/18/2022 08:35 AM   Modules accepted: Orders

## 2022-04-19 ENCOUNTER — Ambulatory Visit: Payer: Medicare Other | Admitting: Physical Therapy

## 2022-04-22 ENCOUNTER — Encounter: Payer: Self-pay | Admitting: Rehabilitative and Restorative Service Providers"

## 2022-04-22 ENCOUNTER — Ambulatory Visit: Payer: Medicare Other | Admitting: Rehabilitative and Restorative Service Providers"

## 2022-04-22 ENCOUNTER — Ambulatory Visit (INDEPENDENT_AMBULATORY_CARE_PROVIDER_SITE_OTHER): Payer: Medicare Other | Admitting: Internal Medicine

## 2022-04-22 DIAGNOSIS — M6281 Muscle weakness (generalized): Secondary | ICD-10-CM

## 2022-04-22 DIAGNOSIS — G8929 Other chronic pain: Secondary | ICD-10-CM

## 2022-04-22 DIAGNOSIS — R262 Difficulty in walking, not elsewhere classified: Secondary | ICD-10-CM

## 2022-04-22 DIAGNOSIS — M25562 Pain in left knee: Secondary | ICD-10-CM | POA: Diagnosis not present

## 2022-04-22 NOTE — Therapy (Signed)
OUTPATIENT PHYSICAL THERAPY TREATMENT NOTE    Patient Name: Kayla Rosales MRN: [id number] DOB:09-07-1955, 67 y.o., female Today's Date: 04/22/2022       PT End of Session - 04/22/22 0854     Visit Number 12    Date for PT Re-Evaluation 05/24/22    Authorization Type Medicare/[employer]    Progress Note Due on Visit 36    PT Start Time 0848    PT Stop Time 0926    PT Time Calculation (min) 38 min    Activity Tolerance Patient tolerated treatment well    Behavior During Therapy [facility] for tasks assessed/performed               Past Medical History:  Diagnosis Date   Allergy    Arthritis    Asthma    Back pain    Benign positional vertigo    Bilateral swelling of feet    Bronchitis    Chicken pox    Colon polyps    Constipation    Cough 10/09/2020   COVID-19 10/09/2020   Diverticulitis    Early menopause occurring in patient age younger than 25 years    Fibromyalgia    GERD (gastroesophageal reflux disease)    Gluten intolerance    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 Still unclear what is cuasing her pain. May be nerve related. May benefit from starting on gabapentin. Start gabapentin 300 mg TID. F/u 4 weeks   Night sweats    Palpitations    Recurrent upper respiratory infection (URI)    Right arm weakness 09/28/2015   Last Assessment & Plan:  Formatting of this note might be different from the original. Referral to PT today. Suspect residual after stroke in left basal ganglia   Sleep apnea    Snoring 01/04/2019   Stroke (cerebrum) ([place]) 2017   right UE/neck sx, resolved.    Vertigo 08/11/2015   Last Assessment & Plan:  Formatting of this note might be different from the original. Concern given gradual onset over the last week and a half  in addition to neck pain. Patient needs neuroimaging to rule out dissecting vertebral aneurysm. Patient advised to go to emergency room. She declined ambulance transport   Vitamin B 12 deficiency    Vitamin D deficiency    Past Surgical History:  Procedure Laterality Date   CARPAL TUNNEL RELEASE     COLONOSCOPY  2015   [name]- [facility], [place]; 5 yr repeat recommended   COLONOSCOPY W/ POLYPECTOMY  05/26/2018   x5 polyps   KNEE ARTHROSCOPY Bilateral 2013   meniscus  x2   NECK SURGERY  2018   disc fusion   UPPER GASTROINTESTINAL ENDOSCOPY     Patient Active Problem List   Diagnosis Date Noted   Vitamin D deficiency 01/21/2022   Other tear of medial meniscus, current injury, left knee, subsequent encounter 01/14/2022   Other adverse food reactions, not elsewhere classified, subsequent encounter 27/06/5007   Diastolic dysfunction 38/18/2993   Heart murmur 02/16/2019   Hyperlipidemia LDL goal <100 02/16/2019   Class 1 obesity with serious comorbidity and body mass index (BMI) of 30.0 to 30.9 in adult 01/04/2019   Pelvic floor dysfunction in female 12/06/2018   Constipation  due to outlet dysfunction 12/06/2018   Lactose intolerance 12/06/2018   Diverticulosis 05/27/2018   Osteopenia 01/19/2018   Irritable bowel syndrome with constipation 01/17/2018   History of colonic polyp 01/17/2018   History of stroke 01/15/2018   GERD (gastroesophageal reflux disease)    Asthma    S/P cervical spinal fusion 09/29/2017   Macrocytosis without anemia 11/25/2015   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   Heart palpitations 09/08/2015   Vitamin B12 deficiency 04/06/2014   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   Vestibular neuritis 10/15/2006   Family history of malignant neoplasm of breast 03/18/2006   Diaphragmatic hernia 03/18/2006   Premature menopause 03/18/2006    PCP: Ma Hillock, DO  REFERRING PROVIDER: Leandrew Koyanagi, MD   REFERRING DIAG: [phone] (ICD-10-CM) - Primary  osteoarthritis of left knee   THERAPY DIAG:  Chronic pain of left knee  Difficulty in walking, not elsewhere classified  Muscle weakness (generalized)  Rationale for Evaluation and Treatment Rehabilitation  ONSET DATE: 01/07/2022  SUBJECTIVE:   SUBJECTIVE STATEMENT: Pt reports that her knee is feeling better at night.  PERTINENT HISTORY: CVA, allergies, osteopenia, diverticulosis, Hx of cervical fusion, vestibular neuritis/BPPV, lumbar herniated disc, sleep apnea PAIN:  Are you having pain? Yes: NPRS scale: 2/10 Pain location: left knee Pain description: sore Aggravating factors: movement Relieving factors: rest  PRECAUTIONS: None  WEIGHT BEARING RESTRICTIONS: No  FALLS:  Has patient fallen in last 6 months? No  LIVING ENVIRONMENT: Lives with: lives with their spouse Lives in: House/apartment Stairs: Yes: Internal: 15 steps; on left going up and External: 2 steps; none Has following equipment at home: None  OCCUPATION: Retired Education officer, museum  PLOF: Independent and Leisure: playing with grandchildren, sewing, gardening  PATIENT GOALS: To be able to walk and get some weight off and enjoy my grandchildren and go on hikes if I want to.  OBJECTIVE:   DIAGNOSTIC FINDINGS:  MRI of left knee on 01/24/22: IMPRESSION: 1. Severe attenuation of the body of the medial meniscus which may reflect prior meniscectomy versus maceration. Degeneration of the remainder of the anterior horn and posterior horns of the medial meniscus. Small oblique tear of the posterior horn of the medial meniscus extending to the superior articular surface. 2. Complex signal in the posterior horn of the lateral meniscus concerning for a tear. 3. Cartilage fissuring of the patellar apex. Partial-thickness cartilage loss of the patellofemoral compartment most severe in the medial patellofemoral compartment. 4. Extensive full-thickness cartilage loss of the medial femorotibial compartment with  subchondral reactive marrow edema in the medial tibial plateau.  PATIENT SURVEYS:  Eval:  FOTO 40% (projected 56% by visit 15) 03/26/2022:  FOTO 52%  COGNITION: Overall cognitive status: Within functional limits for tasks assessed     SENSATION: Reports some numbness/tingling down left LE  EDEMA:  Circumferential: right 43 cm, left 43.5 cm  MUSCLE LENGTH: Bilat hamstring tightness noted  POSTURE: rounded shoulders  PALPATION: Tenderness to palpation along right knee medial joint line  LOWER EXTREMITY ROM:  WFL  LOWER EXTREMITY MMT:  Eval:  Bilateral hips 4/5, Left quad/hamstring 4/5, right quad/hamstring 5/5 03/26/2022:  Bilateral hip strength of 4 to 4+/5, Left quad/hamstrings 4 to 4+/5  LOWER EXTREMITY SPECIAL TESTS:  Knee special tests: Anterior drawer test: negative, Posterior drawer test: positive , and Patellafemoral grind test: negative  FUNCTIONAL TESTS:  Eval:   5 times sit to stand: 20.3 sec with pushing up from thighs  02/19/2022: 3 Minute Walk Test:  643 ft with pain  going up to 7/10 towards end of ambulation  02/27/2022: 5 times sit to stand: 13.1 sec with pushing up from thighs  03/26/2022: 3 Minute Walk Test: 714 ft with pain of 3/10 at end of ambulation 5 times sit to stand: 10.9 sec without UE use, but did use rocking momentum  GAIT: Distance walked: >100 ft Assistive device utilized: None Level of assistance: Complete Independence Comments: Antalgic gait pattern   TODAY'S TREATMENT:  DATE: 04/22/2022 NuStep: Level 5x 6 minutes-PT present to discuss progress Standing hamstring stretch at step 2x20 sec bilat Leg Press (seat at 5) 70# 2x10 Standing at barre with blue loop around ankles:  hip abduction and hip extension 2x10 bilat (with cuing for positioning and posture) FWD and backwards monster walking 3 x 10 ft each Standing calf stretch 2x20 sec bilat on slanted rocker board FWD and side partial lunges on bosu x10 each bilat Farmer's  carry with 6# dumbbells around PT gyms x2   DATE: 04/17/2022 NuStep: Level 5x 7 minutes-PT present to discuss progress Seated with 3#:  marching, LAQ with hold at TKE, heel/toe raises, hip abduction scissors.  2x10 bilat each Seated hamstring stretch 2x20 seconds bilat Ambulation around PT clinic with 3# ankle weights High marching 6 x 10 ft with 3# ankle weights Standing with 3# hip abduction and hip extension 2x10 bilat Standing calf stretch 2x20 sec bilat on slanted rocker board Wall squats 2x10   03/27/22:Pt arrives for aquatic physical therapy. Treatment took place in 3.5-5.5 feet of water. Water temperature was 91 degrees F.. Pt entered the pool via stairs step to step with moderate use of the rails. Pt requires buoyancy of water for support and to offload joints with strengthening exercises.  Seated water bench with 75% submersion and discussion of current status. Pt performed seated LE AROM exercises 20x in all planes. 75% depth water walking with hand help single buoy wts 8 lengths each with short rest break to analyze knee before changing direction. Standing hip flex/ext , abduction, and circles 15x Bil with some UE on pool wall for balance added ankle fins for resistance. Core press with double bouy 20x. Single leg stance 10 sec no UE static 1x, added UE movements 20x sagittal plane together then reciprocally Bil. Standing knee extension Bil with small noodle 10x Bil. Standing high knee marching holding onto yellow noodle 20x . Yellow noodle LE decompression x 3 min at end of session.     PATIENT EDUCATION:  Education details: Issued HEP Person educated: Patient Education method: Explanation, Media planner, and Handouts Education comprehension: verbalized understanding and returned demonstration  HOME EXERCISE PROGRAM: Access Code: [email or web address] URL: [email or web address] Date: 04/17/2022 Prepared by: Shelby Dubin Klay Sobotka  Exercises - Seated Hamstring Stretch  - 1 x daily  - 7 x weekly - 1 sets - 2 reps - 20 sec hold - Seated Piriformis Stretch  - 1 x daily - 7 x weekly - 1 sets - 2 reps - 20 sec hold - Seated Long Arc Quad  - 1 x daily - 7 x weekly - 2 sets - 10 reps - Seated March  - 1 x daily - 7 x weekly - 2 sets - 10 reps - Seated Quad Set  - 1 x daily - 7 x weekly - 2 sets - 10 reps - Wall Squat  - 1 x daily - 7 x weekly - 2 sets - 10 reps - Standing Hip Abduction with Counter Support  - 1 x daily - 7 x weekly -  2 sets - 10 reps - Standing Hip Extension with Counter Support  - 1 x daily - 7 x weekly - 2 sets - 10 reps - Standing March with Counter Support  - 1 x daily - 7 x weekly - 2 sets - 10 reps  ASSESSMENT:  CLINICAL IMPRESSION:  Ms Berrett arrives to skilled PT reporting pain of 2/10.  Patient continues to progress with strengthening and able to advance strengthening during session today.  Patient with overall decrease pain noted throughout.  Patient continues to require skilled PT to progress towards goal related activities.  OBJECTIVE IMPAIRMENTS: decreased balance, difficulty walking, decreased strength, impaired flexibility, and pain.   ACTIVITY LIMITATIONS: lifting, bending, squatting, stairs, and transfers  PARTICIPATION LIMITATIONS: cleaning, community activity, and yard work  PERSONAL FACTORS: Past/current experiences and 3+ comorbidities: CVA, vertigo, OA  are also affecting patient's functional outcome.   REHAB POTENTIAL: Good  CLINICAL DECISION MAKING: Stable/uncomplicated  EVALUATION COMPLEXITY: Moderate   GOALS: Goals reviewed with patient? Yes  SHORT TERM GOALS: Target date: 02/22/2022  Pt will be independent with initial HEP. Baseline: Goal status: MET on 02/27/2022  2.  Pt will decreased time with 5 times sit to/from stand to 15 sec or less to demonstrate improved functional strength. Baseline: 20.3 sec Goal status: MET on 02/27/2022   LONG TERM GOALS: Target date: 03/29/2022   Pt will be independent with advanced  HEP. Baseline:  Goal status: IN PROGRESS  2.  Pt will increase FOTO to at least 56% to demonstrate improvements in functional mobility. Baseline: 40% Goal status: IN PROGRESS  3.  Pt will increase bilateral hip and left knee strength to at least 4+ to 5/5 to allow her to navigate stairs in home with increased ease. Baseline:  Goal status: IN PROGRESS  4.  Pt will report ability to walk for greater than 20 minutes without an increase in knee pain to allow pt to return to walking with her family. Baseline:  Goal status: IN PROGRESS (ambulated for 2 miles over Thanksgiving and had increased pain noted)  5.  Pt will deny any dizziness during typical functional tasks. Baseline:  Goal status: IN PROGRESS (no dizziness reported on 03/26/22)   PLAN:  PT FREQUENCY: 2x/week  PT DURATION: 8 weeks  PLANNED INTERVENTIONS: Therapeutic exercises, Therapeutic activity, Neuromuscular re-education, Balance training, Gait training, Patient/Family education, Self Care, Joint mobilization, Joint manipulation, Stair training, Vestibular training, Canalith repositioning, Aquatic Therapy, Dry Needling, Electrical stimulation, Spinal manipulation, Spinal mobilization, Cryotherapy, Moist heat, Taping, Vasopneumatic device, Ultrasound, Ionotophoresis '4mg'$ /ml Dexamethasone, Manual therapy, and Re-evaluation  PLAN FOR NEXT SESSION: aquatic PT weekly, strengthening, flexibility   Juel Burrow, PT 04/22/22 9:35 AM   [facility] [address] [place], [place] [address] Phone # [phone] Fax [phone]

## 2022-04-23 ENCOUNTER — Ambulatory Visit (INDEPENDENT_AMBULATORY_CARE_PROVIDER_SITE_OTHER): Payer: Medicare Other | Admitting: Internal Medicine

## 2022-04-23 ENCOUNTER — Encounter (INDEPENDENT_AMBULATORY_CARE_PROVIDER_SITE_OTHER): Payer: Self-pay | Admitting: Internal Medicine

## 2022-04-23 VITALS — BP 130/72 | HR 79 | Temp 98.0°F | Ht 64.0 in | Wt 177.0 lb

## 2022-04-23 DIAGNOSIS — E669 Obesity, unspecified: Secondary | ICD-10-CM

## 2022-04-23 DIAGNOSIS — Z6830 Body mass index (BMI) 30.0-30.9, adult: Secondary | ICD-10-CM | POA: Diagnosis not present

## 2022-04-23 DIAGNOSIS — R638 Other symptoms and signs concerning food and fluid intake: Secondary | ICD-10-CM | POA: Diagnosis not present

## 2022-04-23 DIAGNOSIS — E559 Vitamin D deficiency, unspecified: Secondary | ICD-10-CM | POA: Diagnosis not present

## 2022-04-23 NOTE — Progress Notes (Incomplete)
Interval history: Kayla Rosales presents today for follow-up.  She acknowledges suboptimal adherence to meal plan over the holidays.  She reports was feeling stressed due to events.  She was exposed to tempting foods and had a difficult time adhering with reduced calorie nutrition plan.  She also had been prescribed metformin for its incretin effect but after talking with some of her friends she decided not to try medications out of concern for medication side effects.  She has not been exercising, and is considering finding a gym close to home.  She inquires about most effective approach for weight loss and was counseled on this topic again today.  Plan: We reviewed the 3 pillars of weight lost focused around nutrition, behavioral and physical activity.  She is not a candidate for pharmacotherapy at present time.  We discussed the importance of journaling and we demonstrated the use of my fitness pal.  She will work on tracking her calories and try to stay within prescribed range of 1200 cal based on her basal metabolic rate.  Metformin was discontinued from medication list.

## 2022-04-24 ENCOUNTER — Ambulatory Visit: Payer: Medicare Other | Admitting: Physical Therapy

## 2022-04-24 ENCOUNTER — Encounter: Payer: Self-pay | Admitting: Physical Therapy

## 2022-04-24 DIAGNOSIS — M6281 Muscle weakness (generalized): Secondary | ICD-10-CM

## 2022-04-24 DIAGNOSIS — M25562 Pain in left knee: Secondary | ICD-10-CM | POA: Diagnosis not present

## 2022-04-24 DIAGNOSIS — G8929 Other chronic pain: Secondary | ICD-10-CM

## 2022-04-24 DIAGNOSIS — R262 Difficulty in walking, not elsewhere classified: Secondary | ICD-10-CM

## 2022-04-24 NOTE — Therapy (Signed)
OUTPATIENT PHYSICAL THERAPY TREATMENT NOTE    Patient Name: Kayla Rosales MRN: [id number] DOB:Feb 23, 1956, 67 y.o., female Today's Date: 04/24/2022       PT End of Session - 04/24/22 1600     Visit Number 13    Date for PT Re-Evaluation 05/24/22    Authorization Type Medicare/[employer]    Progress Note Due on Visit 29    PT Start Time 1100    PT Stop Time 1145    PT Time Calculation (min) 45 min    Activity Tolerance Patient tolerated treatment well    Behavior During Therapy [facility] for tasks assessed/performed               Past Medical History:  Diagnosis Date   Allergy    Arthritis    Asthma    Back pain    Benign positional vertigo    Bilateral swelling of feet    Bronchitis    Chicken pox    Colon polyps    Constipation    Cough 10/09/2020   COVID-19 10/09/2020   Diverticulitis    Early menopause occurring in patient age younger than 77 years    Fibromyalgia    GERD (gastroesophageal reflux disease)    Gluten intolerance    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 Still unclear what is cuasing her pain. May be nerve related. May benefit from starting on gabapentin. Start gabapentin 300 mg TID. F/u 4 weeks   Night sweats    Palpitations    Recurrent upper respiratory infection (URI)    Right arm weakness 09/28/2015   Last Assessment & Plan:  Formatting of this note might be different from the original. Referral to PT today. Suspect residual after stroke in left basal ganglia   Sleep apnea    Snoring 01/04/2019   Stroke (cerebrum) ([place]) 2017   right UE/neck sx, resolved.    Vertigo 08/11/2015   Last Assessment & Plan:  Formatting of this note might be different from the original. Concern given gradual onset over the last week and a  half in addition to neck pain. Patient needs neuroimaging to rule out dissecting vertebral aneurysm. Patient advised to go to emergency room. She declined ambulance transport   Vitamin B 12 deficiency    Vitamin D deficiency    Past Surgical History:  Procedure Laterality Date   CARPAL TUNNEL RELEASE     COLONOSCOPY  2015   [name]- [facility], [place]; 5 yr repeat recommended   COLONOSCOPY W/ POLYPECTOMY  05/26/2018   x5 polyps   KNEE ARTHROSCOPY Bilateral 2013   meniscus  x2   NECK SURGERY  2018   disc fusion   UPPER GASTROINTESTINAL ENDOSCOPY     Patient Active Problem List   Diagnosis Date Noted   Vitamin D deficiency 01/21/2022   Other tear of medial meniscus, current injury, left knee, subsequent encounter 01/14/2022   Other adverse food reactions, not elsewhere classified, subsequent encounter 93/23/5573   Diastolic dysfunction 22/05/5425   Heart murmur 02/16/2019   Hyperlipidemia LDL goal <100 02/16/2019   Class 1 obesity with serious comorbidity and body mass index (BMI) of 30.0 to 30.9 in adult 01/04/2019   Pelvic floor dysfunction in female 12/06/2018   Constipation  due to outlet dysfunction 12/06/2018   Lactose intolerance 12/06/2018   Diverticulosis 05/27/2018   Osteopenia 01/19/2018   Irritable bowel syndrome with constipation 01/17/2018   History of colonic polyp 01/17/2018   History of stroke 01/15/2018   GERD (gastroesophageal reflux disease)    Asthma    S/P cervical spinal fusion 09/29/2017   Macrocytosis without anemia 11/25/2015   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   Heart palpitations 09/08/2015   Vitamin B12 deficiency 04/06/2014   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   Vestibular neuritis 10/15/2006   Family history of malignant neoplasm of breast 03/18/2006   Diaphragmatic hernia 03/18/2006   Premature menopause 03/18/2006    PCP: Ma Hillock, DO  REFERRING PROVIDER: Leandrew Koyanagi, MD   REFERRING DIAG: [phone] (ICD-10-CM) -  Primary osteoarthritis of left knee   THERAPY DIAG:  Chronic pain of left knee  Difficulty in walking, not elsewhere classified  Muscle weakness (generalized)  Rationale for Evaluation and Treatment Rehabilitation  ONSET DATE: 01/07/2022  SUBJECTIVE:   SUBJECTIVE STATEMENT: Pt reports that her knee is feeling better at night.  PERTINENT HISTORY: CVA, allergies, osteopenia, diverticulosis, Hx of cervical fusion, vestibular neuritis/BPPV, lumbar herniated disc, sleep apnea PAIN:  Are you having pain? Yes: NPRS scale: 1/10 Pain location: left knee Pain description: sore Aggravating factors: movement Relieving factors: rest  PRECAUTIONS: None  WEIGHT BEARING RESTRICTIONS: No  FALLS:  Has patient fallen in last 6 months? No  LIVING ENVIRONMENT: Lives with: lives with their spouse Lives in: House/apartment Stairs: Yes: Internal: 15 steps; on left going up and External: 2 steps; none Has following equipment at home: None  OCCUPATION: Retired Education officer, museum  PLOF: Independent and Leisure: playing with grandchildren, sewing, gardening  PATIENT GOALS: To be able to walk and get some weight off and enjoy my grandchildren and go on hikes if I want to.  OBJECTIVE:   DIAGNOSTIC FINDINGS:  MRI of left knee on 01/24/22: IMPRESSION: 1. Severe attenuation of the body of the medial meniscus which may reflect prior meniscectomy versus maceration. Degeneration of the remainder of the anterior horn and posterior horns of the medial meniscus. Small oblique tear of the posterior horn of the medial meniscus extending to the superior articular surface. 2. Complex signal in the posterior horn of the lateral meniscus concerning for a tear. 3. Cartilage fissuring of the patellar apex. Partial-thickness cartilage loss of the patellofemoral compartment most severe in the medial patellofemoral compartment. 4. Extensive full-thickness cartilage loss of the medial femorotibial compartment  with subchondral reactive marrow edema in the medial tibial plateau.  PATIENT SURVEYS:  Eval:  FOTO 40% (projected 56% by visit 15) 03/26/2022:  FOTO 52%  COGNITION: Overall cognitive status: Within functional limits for tasks assessed     SENSATION: Reports some numbness/tingling down left LE  EDEMA:  Circumferential: right 43 cm, left 43.5 cm  MUSCLE LENGTH: Bilat hamstring tightness noted  POSTURE: rounded shoulders  PALPATION: Tenderness to palpation along right knee medial joint line  LOWER EXTREMITY ROM:  WFL  LOWER EXTREMITY MMT:  Eval:  Bilateral hips 4/5, Left quad/hamstring 4/5, right quad/hamstring 5/5 03/26/2022:  Bilateral hip strength of 4 to 4+/5, Left quad/hamstrings 4 to 4+/5  LOWER EXTREMITY SPECIAL TESTS:  Knee special tests: Anterior drawer test: negative, Posterior drawer test: positive , and Patellafemoral grind test: negative  FUNCTIONAL TESTS:  Eval:   5 times sit to stand: 20.3 sec with pushing up from thighs  02/19/2022: 3 Minute Walk Test:  643 ft with pain  going up to 7/10 towards end of ambulation  02/27/2022: 5 times sit to stand: 13.1 sec with pushing up from thighs  03/26/2022: 3 Minute Walk Test: 714 ft with pain of 3/10 at end of ambulation 5 times sit to stand: 10.9 sec without UE use, but did use rocking momentum  GAIT: Distance walked: >100 ft Assistive device utilized: None Level of assistance: Complete Independence Comments: Antalgic gait pattern   TODAY'S TREATMENT:  04/24/22:Pt arrives for aquatic physical therapy. Treatment took place in 3.5-5.5 feet of water. Water temperature was 88 degrees F.. Pt entered the pool via stairs step to step with moderate use of the rails. Pt requires buoyancy of water for support and to offload joints with strengthening exercises.  Seated water bench with 75% submersion and discussion of current status. Pt performed seated LE AROM exercises 20x in all planes. 75% depth water walking  with hand help single buoy wts 8 lengths each with short rest break to analyze knee before changing direction. Standing hip flex/ext , abduction, and circles 15x Bil with some UE on pool wall for balance ankle fins for resistance. Core press with double bouy 20x. Single leg stance 10 sec no UE static 1x, added UE movements 20x sagittal plane together then reciprocally Bil. Standing knee extension Bil with small noodle 10x Bil. Standing high knee marching holding onto yellow noodle 20x . Yellow noodle LE decompression x 3 min at end of session.  DATE: 04/22/2022 NuStep: Level 5x 6 minutes-PT present to discuss progress Standing hamstring stretch at step 2x20 sec bilat Leg Press (seat at 5) 70# 2x10 Standing at barre with blue loop around ankles:  hip abduction and hip extension 2x10 bilat (with cuing for positioning and posture) FWD and backwards monster walking 3 x 10 ft each Standing calf stretch 2x20 sec bilat on slanted rocker board FWD and side partial lunges on bosu x10 each bilat Farmer's carry with 6# dumbbells around PT gyms x2   DATE: 04/17/2022 NuStep: Level 5x 7 minutes-PT present to discuss progress Seated with 3#:  marching, LAQ with hold at TKE, heel/toe raises, hip abduction scissors.  2x10 bilat each Seated hamstring stretch 2x20 seconds bilat Ambulation around PT clinic with 3# ankle weights High marching 6 x 10 ft with 3# ankle weights Standing with 3# hip abduction and hip extension 2x10 bilat Standing calf stretch 2x20 sec bilat on slanted rocker board Wall squats 2x10    PATIENT EDUCATION:  Education details: Issued HEP Person educated: Patient Education method: Explanation, Media planner, and Handouts Education comprehension: verbalized understanding and returned demonstration  HOME EXERCISE PROGRAM: Access Code: [email or web address] URL: [email or web address] Date: 04/17/2022 Prepared by: Shelby Dubin Menke  Exercises - Seated Hamstring Stretch  - 1 x daily - 7 x  weekly - 1 sets - 2 reps - 20 sec hold - Seated Piriformis Stretch  - 1 x daily - 7 x weekly - 1 sets - 2 reps - 20 sec hold - Seated Long Arc Quad  - 1 x daily - 7 x weekly - 2 sets - 10 reps - Seated March  - 1 x daily - 7 x weekly - 2 sets - 10 reps - Seated Quad Set  - 1 x daily - 7 x weekly - 2 sets - 10 reps - Wall Squat  - 1 x daily - 7 x weekly - 2 sets - 10 reps - Standing Hip Abduction with Counter Support  - 1 x daily - 7 x weekly - 2 sets -  10 reps - Standing Hip Extension with Counter Support  - 1 x daily - 7 x weekly - 2 sets - 10 reps - Standing March with Counter Support  - 1 x daily - 7 x weekly - 2 sets - 10 reps  ASSESSMENT:  CLINICAL IMPRESSION:  Pt arrives for aquatic PT with very little knee pain and continued to have very little pain as she performed her exercises. Pt seemed to be able to move faster and with less caution. Pt tolerates all resistance added very well.   OBJECTIVE IMPAIRMENTS: decreased balance, difficulty walking, decreased strength, impaired flexibility, and pain.   ACTIVITY LIMITATIONS: lifting, bending, squatting, stairs, and transfers  PARTICIPATION LIMITATIONS: cleaning, community activity, and yard work  PERSONAL FACTORS: Past/current experiences and 3+ comorbidities: CVA, vertigo, OA  are also affecting patient's functional outcome.   REHAB POTENTIAL: Good  CLINICAL DECISION MAKING: Stable/uncomplicated  EVALUATION COMPLEXITY: Moderate   GOALS: Goals reviewed with patient? Yes  SHORT TERM GOALS: Target date: 02/22/2022  Pt will be independent with initial HEP. Baseline: Goal status: MET on 02/27/2022  2.  Pt will decreased time with 5 times sit to/from stand to 15 sec or less to demonstrate improved functional strength. Baseline: 20.3 sec Goal status: MET on 02/27/2022   LONG TERM GOALS: Target date: 03/29/2022   Pt will be independent with advanced HEP. Baseline:  Goal status: IN PROGRESS  2.  Pt will increase FOTO to at  least 56% to demonstrate improvements in functional mobility. Baseline: 40% Goal status: IN PROGRESS  3.  Pt will increase bilateral hip and left knee strength to at least 4+ to 5/5 to allow her to navigate stairs in home with increased ease. Baseline:  Goal status: IN PROGRESS  4.  Pt will report ability to walk for greater than 20 minutes without an increase in knee pain to allow pt to return to walking with her family. Baseline:  Goal status: IN PROGRESS (ambulated for 2 miles over Thanksgiving and had increased pain noted)  5.  Pt will deny any dizziness during typical functional tasks. Baseline:  Goal status: IN PROGRESS (no dizziness reported on 03/26/22)   PLAN:  PT FREQUENCY: 2x/week  PT DURATION: 8 weeks  PLANNED INTERVENTIONS: Therapeutic exercises, Therapeutic activity, Neuromuscular re-education, Balance training, Gait training, Patient/Family education, Self Care, Joint mobilization, Joint manipulation, Stair training, Vestibular training, Canalith repositioning, Aquatic Therapy, Dry Needling, Electrical stimulation, Spinal manipulation, Spinal mobilization, Cryotherapy, Moist heat, Taping, Vasopneumatic device, Ultrasound, Ionotophoresis '4mg'$ /ml Dexamethasone, Manual therapy, and Re-evaluation  PLAN FOR NEXT SESSION: aquatic PT weekly, strengthening, flexibility   Myrene Galas, PTA 04/24/22 4:01 PM   [facility] [address] [place], [place] [address] Phone # [phone] Fax [phone]

## 2022-04-26 ENCOUNTER — Other Ambulatory Visit: Payer: Self-pay | Admitting: Family Medicine

## 2022-04-26 DIAGNOSIS — Z1231 Encounter for screening mammogram for malignant neoplasm of breast: Secondary | ICD-10-CM

## 2022-05-01 ENCOUNTER — Encounter: Payer: Self-pay | Admitting: Physical Therapy

## 2022-05-01 ENCOUNTER — Ambulatory Visit: Payer: Medicare Other | Admitting: Physical Therapy

## 2022-05-01 DIAGNOSIS — M25562 Pain in left knee: Secondary | ICD-10-CM | POA: Diagnosis not present

## 2022-05-01 DIAGNOSIS — R262 Difficulty in walking, not elsewhere classified: Secondary | ICD-10-CM

## 2022-05-01 DIAGNOSIS — M6281 Muscle weakness (generalized): Secondary | ICD-10-CM

## 2022-05-01 DIAGNOSIS — G8929 Other chronic pain: Secondary | ICD-10-CM

## 2022-05-01 NOTE — Therapy (Signed)
OUTPATIENT PHYSICAL THERAPY TREATMENT NOTE    Patient Name: Kayla Rosales MRN: [id number] DOB:04/28/1955, 67 y.o., female Today's Date: 05/01/2022       PT End of Session - 05/01/22 0942     Visit Number 14    Date for PT Re-Evaluation 05/24/22    Authorization Type Medicare/AARP    Progress Note Due on Visit 44    PT Start Time 0941    PT Stop Time 1020    PT Time Calculation (min) 39 min    Activity Tolerance Patient tolerated treatment well    Behavior During Therapy [facility] for tasks assessed/performed               Past Medical History:  Diagnosis Date   Allergy    Arthritis    Asthma    Back pain    Benign positional vertigo    Bilateral swelling of feet    Bronchitis    Chicken pox    Colon polyps    Constipation    Cough 10/09/2020   COVID-19 10/09/2020   Diverticulitis    Early menopause occurring in patient age younger than 81 years    Fibromyalgia    GERD (gastroesophageal reflux disease)    Gluten intolerance    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 Still unclear what is cuasing her pain. May be nerve related. May benefit from starting on gabapentin. Start gabapentin 300 mg TID. F/u 4 weeks   Night sweats    Palpitations    Recurrent upper respiratory infection (URI)    Right arm weakness 09/28/2015   Last Assessment & Plan:  Formatting of this note might be different from the original. Referral to PT today. Suspect residual after stroke in left basal ganglia   Sleep apnea    Snoring 01/04/2019   Stroke (cerebrum) ([place]) 2017   right UE/neck sx, resolved.    Vertigo 08/11/2015   Last Assessment & Plan:  Formatting of this note might be different from the original. Concern given gradual onset over the last week and a  half in addition to neck pain. Patient needs neuroimaging to rule out dissecting vertebral aneurysm. Patient advised to go to emergency room. She declined ambulance transport   Vitamin B 12 deficiency    Vitamin D deficiency    Past Surgical History:  Procedure Laterality Date   CARPAL TUNNEL RELEASE     COLONOSCOPY  2015   [name]- [facility], [place]; 5 yr repeat recommended   COLONOSCOPY W/ POLYPECTOMY  05/26/2018   x5 polyps   KNEE ARTHROSCOPY Bilateral 2013   meniscus  x2   NECK SURGERY  2018   disc fusion   UPPER GASTROINTESTINAL ENDOSCOPY     Patient Active Problem List   Diagnosis Date Noted   Vitamin D deficiency 01/21/2022   Other tear of medial meniscus, current injury, left knee, subsequent encounter 01/14/2022   Other adverse food reactions, not elsewhere classified, subsequent encounter 92/42/6834   Diastolic dysfunction 19/62/2297   Heart murmur 02/16/2019   Hyperlipidemia LDL goal <100 02/16/2019   Class 1 obesity with serious comorbidity and body mass index (BMI) of 30.0 to 30.9 in adult 01/04/2019   Pelvic floor dysfunction in female 12/06/2018   Constipation  due to outlet dysfunction 12/06/2018   Lactose intolerance 12/06/2018   Diverticulosis 05/27/2018   Osteopenia 01/19/2018   Irritable bowel syndrome with constipation 01/17/2018   History of colonic polyp 01/17/2018   History of stroke 01/15/2018   GERD (gastroesophageal reflux disease)    Asthma    S/P cervical spinal fusion 09/29/2017   Macrocytosis without anemia 11/25/2015   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   Heart palpitations 09/08/2015   Vitamin B12 deficiency 04/06/2014   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   Vestibular neuritis 10/15/2006   Family history of malignant neoplasm of breast 03/18/2006   Diaphragmatic hernia 03/18/2006   Premature menopause 03/18/2006    PCP: Ma Hillock, DO  REFERRING PROVIDER: Leandrew Koyanagi, MD   REFERRING DIAG: [phone] (ICD-10-CM) -  Primary osteoarthritis of left knee   THERAPY DIAG:  Chronic pain of left knee  Difficulty in walking, not elsewhere classified  Muscle weakness (generalized)  Rationale for Evaluation and Treatment Rehabilitation  ONSET DATE: 01/07/2022  SUBJECTIVE:   SUBJECTIVE STATEMENT:  Had to take care of sick grandson over the weekend and that challenged my knee ( up & down al lot). It is better today but last week was really good.  PERTINENT HISTORY: CVA, allergies, osteopenia, diverticulosis, Hx of cervical fusion, vestibular neuritis/BPPV, lumbar herniated disc, sleep apnea PAIN:  Are you having pain? Yes: NPRS scale: 3/10 Pain location: left knee Pain description: sore Aggravating factors: movement Relieving factors: rest  PRECAUTIONS: None  WEIGHT BEARING RESTRICTIONS: No  FALLS:  Has patient fallen in last 6 months? No  LIVING ENVIRONMENT: Lives with: lives with their spouse Lives in: House/apartment Stairs: Yes: Internal: 15 steps; on left going up and External: 2 steps; none Has following equipment at home: None  OCCUPATION: Retired Education officer, museum  PLOF: Independent and Leisure: playing with grandchildren, sewing, gardening  PATIENT GOALS: To be able to walk and get some weight off and enjoy my grandchildren and go on hikes if I want to.  OBJECTIVE:   DIAGNOSTIC FINDINGS:  MRI of left knee on 01/24/22: IMPRESSION: 1. Severe attenuation of the body of the medial meniscus which may reflect prior meniscectomy versus maceration. Degeneration of the remainder of the anterior horn and posterior horns of the medial meniscus. Small oblique tear of the posterior horn of the medial meniscus extending to the superior articular surface. 2. Complex signal in the posterior horn of the lateral meniscus concerning for a tear. 3. Cartilage fissuring of the patellar apex. Partial-thickness cartilage loss of the patellofemoral compartment most severe in the medial patellofemoral  compartment. 4. Extensive full-thickness cartilage loss of the medial femorotibial compartment with subchondral reactive marrow edema in the medial tibial plateau.  PATIENT SURVEYS:  Eval:  FOTO 40% (projected 56% by visit 15) 03/26/2022:  FOTO 52%  COGNITION: Overall cognitive status: Within functional limits for tasks assessed     SENSATION: Reports some numbness/tingling down left LE  EDEMA:  Circumferential: right 43 cm, left 43.5 cm  MUSCLE LENGTH: Bilat hamstring tightness noted  POSTURE: rounded shoulders  PALPATION: Tenderness to palpation along right knee medial joint line  LOWER EXTREMITY ROM:  WFL  LOWER EXTREMITY MMT:  Eval:  Bilateral hips 4/5, Left quad/hamstring 4/5, right quad/hamstring 5/5 03/26/2022:  Bilateral hip strength of 4 to 4+/5, Left quad/hamstrings 4 to 4+/5  LOWER EXTREMITY SPECIAL TESTS:  Knee special tests: Anterior drawer test: negative, Posterior drawer test: positive , and Patellafemoral grind test: negative  FUNCTIONAL TESTS:  Eval:   5  times sit to stand: 20.3 sec with pushing up from thighs  02/19/2022: 3 Minute Walk Test:  643 ft with pain going up to 7/10 towards end of ambulation  02/27/2022: 5 times sit to stand: 13.1 sec with pushing up from thighs  03/26/2022: 3 Minute Walk Test: 714 ft with pain of 3/10 at end of ambulation 5 times sit to stand: 10.9 sec without UE use, but did use rocking momentum  GAIT: Distance walked: >100 ft Assistive device utilized: None Level of assistance: Complete Independence Comments: Antalgic gait pattern   TODAY'S TREATMENT:  05/01/22:Pt arrives for aquatic physical therapy. Treatment took place in 3.5-5.5 feet of water. Water temperature was 88 degrees F.. Pt entered the pool via stairs step to step with moderate use of the rails. Pt requires buoyancy of water for support and to offload joints with strengthening exercises as well as utilizing the upthrust to challenge her LE and  pelvic muscle strength.75% depth water walking with hand help single buoy wts 10 lengths. Standing hip flex/ext , abduction, and circles 15x Bil with some UE on pool wall for balance ankle fins for resistance. Core press with double bouy 20x. Single leg stance 10 sec no UE static 1x, added UE movements 20x sagittal plane together then reciprocally Bil. Standing knee extension Bil with small noodle 10x Bil. Standing high knee marching holding onto yellow noodle 20x . Yellow noodle LE decompression x 3 min at end of session and 5 min underwater bicycle4 min   04/24/22:Pt arrives for aquatic physical therapy. Treatment took place in 3.5-5.5 feet of water. Water temperature was 88 degrees F.. Pt entered the pool via stairs step to step with moderate use of the rails. Pt requires buoyancy of water for support and to offload joints with strengthening exercises.  Seated water bench with 75% submersion and discussion of current status. Pt performed seated LE AROM exercises 20x in all planes. 75% depth water walking with hand help single buoy wts 8 lengths each with short rest break to analyze knee before changing direction. Standing hip flex/ext , abduction, and circles 15x Bil with some UE on pool wall for balance ankle fins for resistance. Core press with double bouy 20x. Single leg stance 10 sec no UE static 1x, added UE movements 20x sagittal plane together then reciprocally Bil. Standing knee extension Bil with small noodle 10x Bil. Standing high knee marching holding onto yellow noodle 20x . Yellow noodle LE decompression x 3 min at end of session.  DATE: 04/22/2022 NuStep: Level 5x 6 minutes-PT present to discuss progress Standing hamstring stretch at step 2x20 sec bilat Leg Press (seat at 5) 70# 2x10 Standing at barre with blue loop around ankles:  hip abduction and hip extension 2x10 bilat (with cuing for positioning and posture) FWD and backwards monster walking 3 x 10 ft each Standing calf stretch 2x20  sec bilat on slanted rocker board FWD and side partial lunges on bosu x10 each bilat Farmer's carry with 6# dumbbells around PT gyms x2   PATIENT EDUCATION:  Education details: Issued HEP Person educated: Patient Education method: Consulting civil engineer, Media planner, and Handouts Education comprehension: verbalized understanding and returned demonstration  HOME EXERCISE PROGRAM: Access Code: [email or web address] URL: [email or web address] Date: 04/17/2022 Prepared by: Shelby Dubin Menke  Exercises - Seated Hamstring Stretch  - 1 x daily - 7 x weekly - 1 sets - 2 reps - 20 sec hold - Seated Piriformis Stretch  - 1 x daily - 7 x weekly - 1 sets - 2  reps - 20 sec hold - Seated Long Arc Quad  - 1 x daily - 7 x weekly - 2 sets - 10 reps - Seated March  - 1 x daily - 7 x weekly - 2 sets - 10 reps - Seated Quad Set  - 1 x daily - 7 x weekly - 2 sets - 10 reps - Wall Squat  - 1 x daily - 7 x weekly - 2 sets - 10 reps - Standing Hip Abduction with Counter Support  - 1 x daily - 7 x weekly - 2 sets - 10 reps - Standing Hip Extension with Counter Support  - 1 x daily - 7 x weekly - 2 sets - 10 reps - Standing March with Counter Support  - 1 x daily - 7 x weekly - 2 sets - 10 reps  ASSESSMENT:  CLINICAL IMPRESSION:  Pt arrives for aquatic PT with very little knee pain and continued to have very little pain as she performed her exercises. Pt was challenged taking care of her sick grandchild this past weekend that involved a lot of getting up and down resulting in a sore knee. Today she was better and continues to tolerate aquatic exercise very well. Pt more than likely going to join [place] at end of her PT.   OBJECTIVE IMPAIRMENTS: decreased balance, difficulty walking, decreased strength, impaired flexibility, and pain.   ACTIVITY LIMITATIONS: lifting, bending, squatting, stairs, and transfers  PARTICIPATION LIMITATIONS: cleaning, community activity, and yard work  PERSONAL FACTORS: Past/current  experiences and 3+ comorbidities: CVA, vertigo, OA  are also affecting patient's functional outcome.   REHAB POTENTIAL: Good  CLINICAL DECISION MAKING: Stable/uncomplicated  EVALUATION COMPLEXITY: Moderate   GOALS: Goals reviewed with patient? Yes  SHORT TERM GOALS: Target date: 02/22/2022  Pt will be independent with initial HEP. Baseline: Goal status: MET on 02/27/2022  2.  Pt will decreased time with 5 times sit to/from stand to 15 sec or less to demonstrate improved functional strength. Baseline: 20.3 sec Goal status: MET on 02/27/2022   LONG TERM GOALS: Target date: 03/29/2022   Pt will be independent with advanced HEP. Baseline:  Goal status: IN PROGRESS  2.  Pt will increase FOTO to at least 56% to demonstrate improvements in functional mobility. Baseline: 40% Goal status: IN PROGRESS  3.  Pt will increase bilateral hip and left knee strength to at least 4+ to 5/5 to allow her to navigate stairs in home with increased ease. Baseline:  Goal status: IN PROGRESS  4.  Pt will report ability to walk for greater than 20 minutes without an increase in knee pain to allow pt to return to walking with her family. Baseline:  Goal status: IN PROGRESS (ambulated for 2 miles over Thanksgiving and had increased pain noted)  5.  Pt will deny any dizziness during typical functional tasks. Baseline:  Goal status: IN PROGRESS (no dizziness reported on 03/26/22)   PLAN:  PT FREQUENCY: 2x/week  PT DURATION: 8 weeks  PLANNED INTERVENTIONS: Therapeutic exercises, Therapeutic activity, Neuromuscular re-education, Balance training, Gait training, Patient/Family education, Self Care, Joint mobilization, Joint manipulation, Stair training, Vestibular training, Canalith repositioning, Aquatic Therapy, Dry Needling, Electrical stimulation, Spinal manipulation, Spinal mobilization, Cryotherapy, Moist heat, Taping, Vasopneumatic device, Ultrasound, Ionotophoresis '4mg'$ /ml Dexamethasone,  Manual therapy, and Re-evaluation  PLAN FOR NEXT SESSION: aquatic PT weekly, strengthening, flexibility   Myrene Galas, PTA 05/01/22 11:04 AM   [facility] [address], [place], [place] [address] Phone # [phone]  Fax [phone]

## 2022-05-01 NOTE — Progress Notes (Signed)
Chief Complaint:   OBESITY Kayla Rosales is here to discuss her progress with her obesity treatment plan along with follow-up of her obesity related diagnoses. Kayla Rosales is on the Category 2 Plan and states she is following her eating plan approximately 25% of the time. Kayla Rosales states she is doing 0 minutes 0 times per week.  Today's visit was #: 5 Starting weight: 181 lbs Starting date: 01/21/2022 Today's weight: 177 lbs Today's date: 04/23/2022 Total lbs lost to date: 4 Total lbs lost since last in-office visit: 0  Interim History: Kayla Rosales presents today for follow-up.  She acknowledges suboptimal adherence to meal plan over the holidays.  She reports was feeling stressed due to events.  She was exposed to tempting foods and had a difficult time adhering with reduced calorie nutrition plan.  She also had been prescribed metformin for its incretin effect but after talking with some of her friends she decided not to try medications out of concern for medication side effects.  She has not been exercising, and is considering finding a gym close to home.  She inquires about most effective approach for weight loss and was counseled on this topic today.  Subjective:   1. Abnormal craving Unchanged. Counseled on causes. Patient declines metformin.   2. Vitamin D deficiency Vitamin D level 32. Patient is on supplementation with no side effects. I discussed labs with the patient today.   Assessment/Plan:   1. Abnormal craving Continue weight loss therapy, increase protein for incretion effect, and decrease simple carbohydrates.   2. Vitamin D deficiency Continue supplementation for a goal of 50-60.  3. Obesity with current BMI 30.4 We reviewed the 3 pillars of weight lost focused around nutrition, behavioral and physical activity.  She is not a candidate for pharmacotherapy at present time.  We discussed the importance of journaling and we demonstrated the use of my fitness pal.  She will work on  tracking her calories and try to stay within prescribed range of 1200 cal based on her basal metabolic rate.  Metformin was discontinued from medication list.  Kayla Rosales is currently in the action stage of change. As such, her goal is to continue with weight loss efforts. She has agreed to change to keeping a food journal and adhering to recommended goals of 1200 calories and 80-90 grams of protein daily.   Exercise goals: Kayla Rosales.   Behavioral modification strategies: increasing lean protein intake, decreasing simple carbohydrates, increasing vegetables, increasing water intake, increasing high fiber foods, no skipping meals, meal planning and cooking strategies, better snacking choices, avoiding temptations, and planning for success.  Kayla Rosales has agreed to follow-up with our clinic in 3 weeks. She was informed of the importance of frequent follow-up visits to maximize her success with intensive lifestyle modifications for her multiple health conditions.   Objective:   Blood pressure 130/72, pulse 79, temperature 98 F (36.7 C), height '5\' 4"'$  (1.626 m), weight 177 lb (80.3 kg), SpO2 96 %. Body mass index is 30.38 kg/m.  General: Cooperative, alert, well developed, in no acute distress. HEENT: Conjunctivae and lids unremarkable. Cardiovascular: Regular rhythm.  Lungs: Normal work of breathing. Neurologic: No focal deficits.   Lab Results  Component Value Date   CREATININE 0.61 03/29/2022   BUN 16 03/29/2022   NA 136 03/29/2022   K 4.2 03/29/2022   CL 102 03/29/2022   CO2 28 03/29/2022   Lab Results  Component Value Date   ALT 18 03/29/2022   AST 18 03/29/2022  ALKPHOS 75 12/27/2020   BILITOT 0.9 03/29/2022   Lab Results  Component Value Date   HGBA1C 5.3 03/29/2022   HGBA1C 5.3 09/12/2021   HGBA1C 5.4 03/19/2021   HGBA1C 5.1 12/09/2019   HGBA1C 5.4 08/17/2019   Lab Results  Component Value Date   INSULIN 12.9 01/21/2022   INSULIN 12.7 12/09/2019   INSULIN 20.0  08/17/2019   Lab Results  Component Value Date   TSH 1.48 03/29/2022   Lab Results  Component Value Date   CHOL 122 03/29/2022   HDL 38 (L) 03/29/2022   LDLCALC 57 03/29/2022   TRIG 209 (H) 03/29/2022   CHOLHDL 3.2 03/29/2022   Lab Results  Component Value Date   VD25OH 32 03/29/2022   VD25OH 31.53 03/19/2021   VD25OH 54.9 12/09/2019   Lab Results  Component Value Date   WBC 6.1 03/29/2022   HGB 15.4 03/29/2022   HCT 42.5 03/29/2022   MCV 97.9 03/29/2022   PLT 232 03/29/2022   Lab Results  Component Value Date   IRON 73 06/15/2019   TIBC 263 06/15/2019   FERRITIN 166 (H) 06/15/2019   Attestation Statements:   Reviewed by clinician on day of visit: allergies, medications, problem list, medical history, surgical history, family history, social history, and previous encounter notes.  Time spent on visit including pre-visit chart review and post-visit care and charting was 30 minutes.   Wilhemena Durie, am acting as transcriptionist for Thomes Dinning, MD.  I have reviewed the above documentation for accuracy and completeness, and I agree with the above. -Thomes Dinning, MD

## 2022-05-02 ENCOUNTER — Ambulatory Visit: Payer: Medicare Other | Admitting: Rehabilitative and Restorative Service Providers"

## 2022-05-02 ENCOUNTER — Encounter: Payer: Medicare Other | Admitting: Rehabilitative and Restorative Service Providers"

## 2022-05-02 ENCOUNTER — Encounter: Payer: Self-pay | Admitting: Rehabilitative and Restorative Service Providers"

## 2022-05-02 DIAGNOSIS — M6281 Muscle weakness (generalized): Secondary | ICD-10-CM

## 2022-05-02 DIAGNOSIS — G8929 Other chronic pain: Secondary | ICD-10-CM

## 2022-05-02 DIAGNOSIS — R262 Difficulty in walking, not elsewhere classified: Secondary | ICD-10-CM

## 2022-05-02 DIAGNOSIS — M25562 Pain in left knee: Secondary | ICD-10-CM | POA: Diagnosis not present

## 2022-05-02 NOTE — Therapy (Signed)
OUTPATIENT PHYSICAL THERAPY TREATMENT NOTE    Patient Name: [name] MRN: [id number] DOB:[birth year], 67 y.o., female Today's Date: 05/02/2022       PT End of Session - 05/02/22 0849     Visit Number 15    Date for PT Re-Evaluation 05/24/22    Authorization Type Medicare/[employer]    Progress Note Due on Visit 29    PT Start Time 0846    PT Stop Time 0926    PT Time Calculation (min) 40 min    Activity Tolerance Patient tolerated treatment well    Behavior During Therapy [facility] for tasks assessed/performed               Past Medical History:  Diagnosis Date   Allergy    Arthritis    Asthma    Back pain    Benign positional vertigo    Bilateral swelling of feet    Bronchitis    Chicken pox    Colon polyps    Constipation    Cough 10/09/2020   COVID-19 10/09/2020   Diverticulitis    Early menopause occurring in patient age younger than 88 years    Fibromyalgia    GERD (gastroesophageal reflux disease)    Gluten intolerance    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 Still unclear what is cuasing her pain. May be nerve related. May benefit from starting on gabapentin. Start gabapentin 300 mg TID. F/u 4 weeks   Night sweats    Palpitations    Recurrent upper respiratory infection (URI)    Right arm weakness 09/28/2015   Last Assessment & Plan:  Formatting of this note might be different from the original. Referral to PT today. Suspect residual after stroke in left basal ganglia   Sleep apnea    Snoring 01/04/2019   Stroke (cerebrum) ([place]) 2017   right UE/neck sx, resolved.    Vertigo 08/11/2015   Last Assessment & Plan:  Formatting of this note might be different from the original. Concern given gradual onset over the last week and a  half in addition to neck pain. Patient needs neuroimaging to rule out dissecting vertebral aneurysm. Patient advised to go to emergency room. She declined ambulance transport   Vitamin B 12 deficiency    Vitamin D deficiency    Past Surgical History:  Procedure Laterality Date   CARPAL TUNNEL RELEASE     COLONOSCOPY  2015   [name]- [facility], [place]; 5 yr repeat recommended   COLONOSCOPY W/ POLYPECTOMY  05/26/2018   x5 polyps   KNEE ARTHROSCOPY Bilateral 2013   meniscus  x2   NECK SURGERY  2018   disc fusion   UPPER GASTROINTESTINAL ENDOSCOPY     Patient Active Problem List   Diagnosis Date Noted   Vitamin D deficiency 01/21/2022   Other tear of medial meniscus, current injury, left knee, subsequent encounter 01/14/2022   Other adverse food reactions, not elsewhere classified, subsequent encounter 12/75/1700   Diastolic dysfunction 17/49/4496   Heart murmur 02/16/2019   Hyperlipidemia LDL goal <100 02/16/2019   Class 1 obesity with serious comorbidity and body mass index (BMI) of 30.0 to 30.9 in adult 01/04/2019   Pelvic floor dysfunction in female 12/06/2018   Constipation  due to outlet dysfunction 12/06/2018   Lactose intolerance 12/06/2018   Diverticulosis 05/27/2018   Osteopenia 01/19/2018   Irritable bowel syndrome with constipation 01/17/2018   History of colonic polyp 01/17/2018   History of stroke 01/15/2018   GERD (gastroesophageal reflux disease)    Asthma    S/P cervical spinal fusion 09/29/2017   Macrocytosis without anemia 11/25/2015   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   Heart palpitations 09/08/2015   Vitamin B12 deficiency 04/06/2014   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   Vestibular neuritis 10/15/2006   Family history of malignant neoplasm of breast 03/18/2006   Diaphragmatic hernia 03/18/2006   Premature menopause 03/18/2006    PCP: Ma Hillock, DO  REFERRING PROVIDER: Leandrew Koyanagi, MD   REFERRING DIAG: [phone] (ICD-10-CM) -  Primary osteoarthritis of left knee   THERAPY DIAG:  Chronic pain of left knee  Difficulty in walking, not elsewhere classified  Muscle weakness (generalized)  Rationale for Evaluation and Treatment Rehabilitation  ONSET DATE: 01/07/2022  SUBJECTIVE:   SUBJECTIVE STATEMENT:  Pt reports having increased knee pain yesterday when she was holding her grandson, but today, she is feeling better.  Pt states that she did join [employer] so she can continue her exercise plan.  PERTINENT HISTORY: CVA, allergies, osteopenia, diverticulosis, Hx of cervical fusion, vestibular neuritis/BPPV, lumbar herniated disc, sleep apnea PAIN:  Are you having pain? Yes: NPRS scale: 2/10 Pain location: left knee Pain description: sore Aggravating factors: movement Relieving factors: rest  PRECAUTIONS: None  WEIGHT BEARING RESTRICTIONS: No  FALLS:  Has patient fallen in last 6 months? No  LIVING ENVIRONMENT: Lives with: lives with their spouse Lives in: House/apartment Stairs: Yes: Internal: 15 steps; on left going up and External: 2 steps; none Has following equipment at home: None  OCCUPATION: Retired Education officer, museum  PLOF: Independent and Leisure: playing with grandchildren, sewing, gardening  PATIENT GOALS: To be able to walk and get some weight off and enjoy my grandchildren and go on hikes if I want to.  OBJECTIVE:   DIAGNOSTIC FINDINGS:  MRI of left knee on 01/24/22: IMPRESSION: 1. Severe attenuation of the body of the medial meniscus which may reflect prior meniscectomy versus maceration. Degeneration of the remainder of the anterior horn and posterior horns of the medial meniscus. Small oblique tear of the posterior horn of the medial meniscus extending to the superior articular surface. 2. Complex signal in the posterior horn of the lateral meniscus concerning for a tear. 3. Cartilage fissuring of the patellar apex. Partial-thickness cartilage loss of the patellofemoral  compartment most severe in the medial patellofemoral compartment. 4. Extensive full-thickness cartilage loss of the medial femorotibial compartment with subchondral reactive marrow edema in the medial tibial plateau.  PATIENT SURVEYS:  Eval:  FOTO 40% (projected 56% by visit 15) 03/26/2022:  FOTO 52%  COGNITION: Overall cognitive status: Within functional limits for tasks assessed     SENSATION: Reports some numbness/tingling down left LE  EDEMA:  Circumferential: right 43 cm, left 43.5 cm  MUSCLE LENGTH: Bilat hamstring tightness noted  POSTURE: rounded shoulders  PALPATION: Tenderness to palpation along right knee medial joint line  LOWER EXTREMITY ROM:  WFL  LOWER EXTREMITY MMT:  Eval:  Bilateral hips 4/5, Left quad/hamstring 4/5, right quad/hamstring 5/5 03/26/2022:  Bilateral hip strength of 4 to 4+/5, Left quad/hamstrings 4 to 4+/5  LOWER EXTREMITY SPECIAL TESTS:  Knee special tests: Anterior drawer test: negative, Posterior drawer test: positive , and Patellafemoral grind test: negative  FUNCTIONAL TESTS:  Eval:  5 times sit to stand: 20.3 sec with pushing up from thighs  02/19/2022: 3 Minute Walk Test:  643 ft with pain going up to 7/10 towards end of ambulation  02/27/2022: 5 times sit to stand: 13.1 sec with pushing up from thighs  03/26/2022: 3 Minute Walk Test: 714 ft with pain of 3/10 at end of ambulation 5 times sit to stand: 10.9 sec without UE use, but did use rocking momentum  GAIT: Distance walked: >100 ft Assistive device utilized: None Level of assistance: Complete Independence Comments: Antalgic gait pattern   TODAY'S TREATMENT:  DATE: 05/02/2022 NuStep: Level 5x 7 minutes-PT present to discuss progress Standing hamstring stretch 2x20 sec bilat FWD and lateral step ups x10 bilat with 6 inch step and UE support Iontopatch application to left knee for 4 hours with dextamethasone FWD and backwards monster walking with blue loop 3 x  10 ft each Side walking with blue loop 3x10 ft bilat FWD and side partial lunges on bosu x10 each bilat Rocker board x1 min Leg press (seat at 6) 70# 2x10 Standing hip extension with blue loop 2x10 bilat   05/01/22:Pt arrives for aquatic physical therapy. Treatment took place in 3.5-5.5 feet of water. Water temperature was 88 degrees F.. Pt entered the pool via stairs step to step with moderate use of the rails. Pt requires buoyancy of water for support and to offload joints with strengthening exercises as well as utilizing the upthrust to challenge her LE and pelvic muscle strength.75% depth water walking with hand help single buoy wts 10 lengths. Standing hip flex/ext , abduction, and circles 15x Bil with some UE on pool wall for balance ankle fins for resistance. Core press with double bouy 20x. Single leg stance 10 sec no UE static 1x, added UE movements 20x sagittal plane together then reciprocally Bil. Standing knee extension Bil with small noodle 10x Bil. Standing high knee marching holding onto yellow noodle 20x . Yellow noodle LE decompression x 3 min at end of session and 5 min underwater bicycle4 min   04/24/22:Pt arrives for aquatic physical therapy. Treatment took place in 3.5-5.5 feet of water. Water temperature was 88 degrees F.. Pt entered the pool via stairs step to step with moderate use of the rails. Pt requires buoyancy of water for support and to offload joints with strengthening exercises.  Seated water bench with 75% submersion and discussion of current status. Pt performed seated LE AROM exercises 20x in all planes. 75% depth water walking with hand help single buoy wts 8 lengths each with short rest break to analyze knee before changing direction. Standing hip flex/ext , abduction, and circles 15x Bil with some UE on pool wall for balance ankle fins for resistance. Core press with double bouy 20x. Single leg stance 10 sec no UE static 1x, added UE movements 20x sagittal plane  together then reciprocally Bil. Standing knee extension Bil with small noodle 10x Bil. Standing high knee marching holding onto yellow noodle 20x . Yellow noodle LE decompression x 3 min at end of session.    PATIENT EDUCATION:  Education details: Issued HEP Person educated: Patient Education method: Explanation, Media planner, and Handouts Education comprehension: verbalized understanding and returned demonstration  HOME EXERCISE PROGRAM: Access Code: [email or web address] URL: [email or web address] Date: 04/17/2022 Prepared by: Shelby Dubin Alexandrina Fiorini  Exercises - Seated Hamstring Stretch  - 1 x daily - 7 x weekly - 1 sets - 2 reps - 20 sec hold - Seated Piriformis Stretch  - 1 x daily - 7  x weekly - 1 sets - 2 reps - 20 sec hold - Seated Long Arc Quad  - 1 x daily - 7 x weekly - 2 sets - 10 reps - Seated March  - 1 x daily - 7 x weekly - 2 sets - 10 reps - Seated Quad Set  - 1 x daily - 7 x weekly - 2 sets - 10 reps - Wall Squat  - 1 x daily - 7 x weekly - 2 sets - 10 reps - Standing Hip Abduction with Counter Support  - 1 x daily - 7 x weekly - 2 sets - 10 reps - Standing Hip Extension with Counter Support  - 1 x daily - 7 x weekly - 2 sets - 10 reps - Standing March with Counter Support  - 1 x daily - 7 x weekly - 2 sets - 10 reps  ASSESSMENT:  CLINICAL IMPRESSION:  Ms Khamis reports that she has joined [employer] and plans to continue exercising there after discharge.  Patient reports that she had some increased pain yesterday secondary to carrying her young grandson yesterday when she was keeping him.  Patient educated on possible benefits to iontopatch, and she was agreeable to trying the patch to assess if there were any benefits from use.  Patient able to progress with strengthening and stair negotiation during visit.  Patient requires use of UE on rails during step up exercise.  OBJECTIVE IMPAIRMENTS: decreased balance, difficulty walking, decreased strength, impaired flexibility, and  pain.   ACTIVITY LIMITATIONS: lifting, bending, squatting, stairs, and transfers  PARTICIPATION LIMITATIONS: cleaning, community activity, and yard work  PERSONAL FACTORS: Past/current experiences and 3+ comorbidities: CVA, vertigo, OA  are also affecting patient's functional outcome.   REHAB POTENTIAL: Good  CLINICAL DECISION MAKING: Stable/uncomplicated  EVALUATION COMPLEXITY: Moderate   GOALS: Goals reviewed with patient? Yes  SHORT TERM GOALS: Target date: 02/22/2022  Pt will be independent with initial HEP. Baseline: Goal status: MET on 02/27/2022  2.  Pt will decreased time with 5 times sit to/from stand to 15 sec or less to demonstrate improved functional strength. Baseline: 20.3 sec Goal status: MET on 02/27/2022   LONG TERM GOALS: Target date: 05/24/2022   Pt will be independent with advanced HEP. Baseline:  Goal status: IN PROGRESS  2.  Pt will increase FOTO to at least 56% to demonstrate improvements in functional mobility. Baseline: 40% Goal status: IN PROGRESS  3.  Pt will increase bilateral hip and left knee strength to at least 4+ to 5/5 to allow her to navigate stairs in home with increased ease. Baseline:  Goal status: IN PROGRESS  4.  Pt will report ability to walk for greater than 20 minutes without an increase in knee pain to allow pt to return to walking with her family. Baseline:  Goal status: IN PROGRESS (ambulated for 2 miles over Thanksgiving and had increased pain noted)  5.  Pt will deny any dizziness during typical functional tasks. Baseline:  Goal status: IN PROGRESS (no dizziness reported on 03/26/22)   PLAN:  PT FREQUENCY: 2x/week  PT DURATION: 8 weeks  PLANNED INTERVENTIONS: Therapeutic exercises, Therapeutic activity, Neuromuscular re-education, Balance training, Gait training, Patient/Family education, Self Care, Joint mobilization, Joint manipulation, Stair training, Vestibular training, Canalith repositioning, Aquatic Therapy,  Dry Needling, Electrical stimulation, Spinal manipulation, Spinal mobilization, Cryotherapy, Moist heat, Taping, Vasopneumatic device, Ultrasound, Ionotophoresis '4mg'$ /ml Dexamethasone, Manual therapy, and Re-evaluation  PLAN FOR NEXT SESSION: assess response to iontopatch, aquatic PT weekly, strengthening, flexibility  Juel Burrow, PT 05/02/22 9:37 AM   [facility] [address], [place], [place] [address] Phone # [phone] Fax [phone]

## 2022-05-02 NOTE — Patient Instructions (Signed)

## 2022-05-06 ENCOUNTER — Ambulatory Visit: Payer: Medicare Other | Admitting: Rehabilitative and Restorative Service Providers"

## 2022-05-06 ENCOUNTER — Encounter: Payer: Self-pay | Admitting: Rehabilitative and Restorative Service Providers"

## 2022-05-06 DIAGNOSIS — G8929 Other chronic pain: Secondary | ICD-10-CM

## 2022-05-06 DIAGNOSIS — M25562 Pain in left knee: Secondary | ICD-10-CM | POA: Diagnosis not present

## 2022-05-06 DIAGNOSIS — M6281 Muscle weakness (generalized): Secondary | ICD-10-CM

## 2022-05-06 DIAGNOSIS — R262 Difficulty in walking, not elsewhere classified: Secondary | ICD-10-CM

## 2022-05-06 NOTE — Therapy (Signed)
OUTPATIENT PHYSICAL THERAPY TREATMENT NOTE    Patient Name: Kayla Rosales MRN: [id number] DOB:1955/07/12, 67 y.o., female Today's Date: 05/06/2022       PT End of Session - 05/06/22 0851     Visit Number 16    Date for PT Re-Evaluation 05/24/22    Authorization Type Medicare/[employer]ARP    Progress Note Due on Visit 85    PT Start Time 0847    PT Stop Time 0928    PT Time Calculation (min) 41 min    Activity Tolerance Patient tolerated treatment well    Behavior During Therapy [facility] for tasks assessed/performed               Past Medical History:  Diagnosis Date   Allergy    Arthritis    Asthma    Back pain    Benign positional vertigo    Bilateral swelling of feet    Bronchitis    Chicken pox    Colon polyps    Constipation    Cough 10/09/2020   COVID-19 10/09/2020   Diverticulitis    Early menopause occurring in patient age younger than 12 years    Fibromyalgia    GERD (gastroesophageal reflux disease)    Gluten intolerance    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 Still unclear what is cuasing her pain. May be nerve related. May benefit from starting on gabapentin. Start gabapentin 300 mg TID. F/u 4 weeks   Night sweats    Palpitations    Recurrent upper respiratory infection (URI)    Right arm weakness 09/28/2015   Last Assessment & Plan:  Formatting of this note might be different from the original. Referral to PT today. Suspect residual after stroke in left basal ganglia   Sleep apnea    Snoring 01/04/2019   Stroke (cerebrum) ([place]) 2017   right UE/neck sx, resolved.    Vertigo 08/11/2015   Last Assessment & Plan:  Formatting of this note might be different from the original. Concern given gradual onset over the last week and a  half in addition to neck pain. Patient needs neuroimaging to rule out dissecting vertebral aneurysm. Patient advised to go to emergency room. She declined ambulance transport   Vitamin B 12 deficiency    Vitamin D deficiency    Past Surgical History:  Procedure Laterality Date   CARPAL TUNNEL RELEASE     COLONOSCOPY  2015   [name]- [facility], [place]; 5 yr repeat recommended   COLONOSCOPY W/ POLYPECTOMY  05/26/2018   x5 polyps   KNEE ARTHROSCOPY Bilateral 2013   meniscus  x2   NECK SURGERY  2018   disc fusion   UPPER GASTROINTESTINAL ENDOSCOPY     Patient Active Problem List   Diagnosis Date Noted   Vitamin D deficiency 01/21/2022   Other tear of medial meniscus, current injury, left knee, subsequent encounter 01/14/2022   Other adverse food reactions, not elsewhere classified, subsequent encounter 90/30/0923   Diastolic dysfunction 30/10/6224   Heart murmur 02/16/2019   Hyperlipidemia LDL goal <100 02/16/2019   Class 1 obesity with serious comorbidity and body mass index (BMI) of 30.0 to 30.9 in adult 01/04/2019   Pelvic floor dysfunction in female 12/06/2018   Constipation  due to outlet dysfunction 12/06/2018   Lactose intolerance 12/06/2018   Diverticulosis 05/27/2018   Osteopenia 01/19/2018   Irritable bowel syndrome with constipation 01/17/2018   History of colonic polyp 01/17/2018   History of stroke 01/15/2018   GERD (gastroesophageal reflux disease)    Asthma    S/P cervical spinal fusion 09/29/2017   Macrocytosis without anemia 11/25/2015   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   Heart palpitations 09/08/2015   Vitamin B12 deficiency 04/06/2014   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   Vestibular neuritis 10/15/2006   Family history of malignant neoplasm of breast 03/18/2006   Diaphragmatic hernia 03/18/2006   Premature menopause 03/18/2006    PCP: Ma Hillock, DO  REFERRING PROVIDER: Leandrew Koyanagi, MD   REFERRING DIAG: [id number] (ICD-10-CM) -  Primary osteoarthritis of left knee   THERAPY DIAG:  Chronic pain of left knee  Difficulty in walking, not elsewhere classified  Muscle weakness (generalized)  Rationale for Evaluation and Treatment Rehabilitation  ONSET DATE: 01/07/2022  SUBJECTIVE:   SUBJECTIVE STATEMENT:  Pt reports that she was able to get up from the floor with her grandchildren over the weekend.  PERTINENT HISTORY: CVA, allergies, osteopenia, diverticulosis, Hx of cervical fusion, vestibular neuritis/BPPV, lumbar herniated disc, sleep apnea PAIN:  Are you having pain? Yes: NPRS scale: 4/10 Pain location: left knee Pain description: sore Aggravating factors: movement Relieving factors: rest  PRECAUTIONS: None  WEIGHT BEARING RESTRICTIONS: No  FALLS:  Has patient fallen in last 6 months? No  LIVING ENVIRONMENT: Lives with: lives with their spouse Lives in: House/apartment Stairs: Yes: Internal: 15 steps; on left going up and External: 2 steps; none Has following equipment at home: None  OCCUPATION: Retired Education officer, museum  PLOF: Independent and Leisure: playing with grandchildren, sewing, gardening  PATIENT GOALS: To be able to walk and get some weight off and enjoy my grandchildren and go on hikes if I want to.  OBJECTIVE:   DIAGNOSTIC FINDINGS:  MRI of left knee on 01/24/22: IMPRESSION: 1. Severe attenuation of the body of the medial meniscus which may reflect prior meniscectomy versus maceration. Degeneration of the remainder of the anterior horn and posterior horns of the medial meniscus. Small oblique tear of the posterior horn of the medial meniscus extending to the superior articular surface. 2. Complex signal in the posterior horn of the lateral meniscus concerning for a tear. 3. Cartilage fissuring of the patellar apex. Partial-thickness cartilage loss of the patellofemoral compartment most severe in the medial patellofemoral compartment. 4. Extensive full-thickness cartilage  loss of the medial femorotibial compartment with subchondral reactive marrow edema in the medial tibial plateau.  PATIENT SURVEYS:  Eval:  FOTO 40% (projected 56% by visit 15) 03/26/2022:  FOTO 52%  COGNITION: Overall cognitive status: Within functional limits for tasks assessed     SENSATION: Reports some numbness/tingling down left LE  EDEMA:  Circumferential: right 43 cm, left 43.5 cm  MUSCLE LENGTH: Bilat hamstring tightness noted  POSTURE: rounded shoulders  PALPATION: Tenderness to palpation along right knee medial joint line  LOWER EXTREMITY ROM:  WFL  LOWER EXTREMITY MMT:  Eval:  Bilateral hips 4/5, Left quad/hamstring 4/5, right quad/hamstring 5/5 03/26/2022:  Bilateral hip strength of 4 to 4+/5, Left quad/hamstrings 4 to 4+/5  LOWER EXTREMITY SPECIAL TESTS:  Knee special tests: Anterior drawer test: negative, Posterior drawer test: positive , and Patellafemoral grind test: negative  FUNCTIONAL TESTS:  Eval:   5 times sit to stand: 20.3 sec with pushing up from thighs  02/19/2022:  3 Minute Walk Test:  643 ft with pain going up to 7/10 towards end of ambulation  02/27/2022: 5 times sit to stand: 13.1 sec with pushing up from thighs  03/26/2022: 3 Minute Walk Test: 714 ft with pain of 3/10 at end of ambulation 5 times sit to stand: 10.9 sec without UE use, but did use rocking momentum  GAIT: Distance walked: >100 ft Assistive device utilized: None Level of assistance: Complete Independence Comments: Antalgic gait pattern   TODAY'S TREATMENT:  DATE: 05/06/2022 NuStep: Level 5x 14 minutes-PT present to discuss progress Seated hamstring stretch 2x20 sec bilat FWD and lateral step ups 2x10 bilat with 6 inch step and UE support FWD and side partial lunges on bosu 2x10 each bilat Leg press (seat at 6) 70# 2x10 Rocker board x1 min Single leg stance on flat side of half bolster 2x20 sec bilat Standing at barre:  hip abduction and hip extension with  blue loop x10 bilat FWD and backwards monster walking with blue loop 3 x 10 ft each Side walking with blue loop 3x10 ft bilat Iontopatch application to left knee for 4 hours with dextamethasone   DATE: 05/02/2022 NuStep: Level 5x 7 minutes-PT present to discuss progress Standing hamstring stretch 2x20 sec bilat FWD and lateral step ups x10 bilat with 6 inch step and UE support Iontopatch application to left knee for 4 hours with dextamethasone FWD and backwards monster walking with blue loop 3 x 10 ft each Side walking with blue loop 3x10 ft bilat FWD and side partial lunges on bosu x10 each bilat Rocker board x1 min Leg press (seat at 6) 70# 2x10 Standing hip extension with blue loop 2x10 bilat   05/01/22:Pt arrives for aquatic physical therapy. Treatment took place in 3.5-5.5 feet of water. Water temperature was 88 degrees F.. Pt entered the pool via stairs step to step with moderate use of the rails. Pt requires buoyancy of water for support and to offload joints with strengthening exercises as well as utilizing the upthrust to challenge her LE and pelvic muscle strength.75% depth water walking with hand help single buoy wts 10 lengths. Standing hip flex/ext , abduction, and circles 15x Bil with some UE on pool wall for balance ankle fins for resistance. Core press with double bouy 20x. Single leg stance 10 sec no UE static 1x, added UE movements 20x sagittal plane together then reciprocally Bil. Standing knee extension Bil with small noodle 10x Bil. Standing high knee marching holding onto yellow noodle 20x . Yellow noodle LE decompression x 3 min at end of session and 5 min underwater bicycle4 min    PATIENT EDUCATION:  Education details: Issued HEP Person educated: Patient Education method: Explanation, Demonstration, and Handouts Education comprehension: verbalized understanding and returned demonstration  HOME EXERCISE PROGRAM: Access Code: [email or web address] URL:  [email or web address] Date: 04/17/2022 Prepared by: Shelby Dubin Raymond Azure  Exercises - Seated Hamstring Stretch  - 1 x daily - 7 x weekly - 1 sets - 2 reps - 20 sec hold - Seated Piriformis Stretch  - 1 x daily - 7 x weekly - 1 sets - 2 reps - 20 sec hold - Seated Long Arc Quad  - 1 x daily - 7 x weekly - 2 sets - 10 reps - Seated March  - 1 x daily - 7 x weekly - 2 sets - 10 reps - Seated Quad Set  - 1 x daily - 7 x weekly - 2 sets - 10 reps - Wall Squat  - 1  x daily - 7 x weekly - 2 sets - 10 reps - Standing Hip Abduction with Counter Support  - 1 x daily - 7 x weekly - 2 sets - 10 reps - Standing Hip Extension with Counter Support  - 1 x daily - 7 x weekly - 2 sets - 10 reps - Standing March with Counter Support  - 1 x daily - 7 x weekly - 2 sets - 10 reps  ASSESSMENT:  CLINICAL IMPRESSION:  Ms Freelove reports that she was a little sore after last PT session and she was getting up and down from the floor with her grandchildren over the weekend, so she is having some increased pain today.  Patient reported that she could not tell much benefits from iontopatch, but was willing to try again today. Patient is progressing with increased strengthening and able to progress with standing balance during therex.  Patient continues to report improved benefits from use of aquatics PT with decreased pain and improved functional mobility.  OBJECTIVE IMPAIRMENTS: decreased balance, difficulty walking, decreased strength, impaired flexibility, and pain.   ACTIVITY LIMITATIONS: lifting, bending, squatting, stairs, and transfers  PARTICIPATION LIMITATIONS: cleaning, community activity, and yard work  PERSONAL FACTORS: Past/current experiences and 3+ comorbidities: CVA, vertigo, OA  are also affecting patient's functional outcome.   REHAB POTENTIAL: Good  CLINICAL DECISION MAKING: Stable/uncomplicated  EVALUATION COMPLEXITY: Moderate   GOALS: Goals reviewed with patient? Yes  SHORT TERM  GOALS: Target date: 02/22/2022  Pt will be independent with initial HEP. Baseline: Goal status: MET on 02/27/2022  2.  Pt will decreased time with 5 times sit to/from stand to 15 sec or less to demonstrate improved functional strength. Baseline: 20.3 sec Goal status: MET on 02/27/2022   LONG TERM GOALS: Target date: 05/24/2022   Pt will be independent with advanced HEP. Baseline:  Goal status: IN PROGRESS  2.  Pt will increase FOTO to at least 56% to demonstrate improvements in functional mobility. Baseline: 40% Goal status: IN PROGRESS  3.  Pt will increase bilateral hip and left knee strength to at least 4+ to 5/5 to allow her to navigate stairs in home with increased ease. Baseline:  Goal status: IN PROGRESS  4.  Pt will report ability to walk for greater than 20 minutes without an increase in knee pain to allow pt to return to walking with her family. Baseline:  Goal status: IN PROGRESS (ambulated for 2 miles over Thanksgiving and had increased pain noted)  5.  Pt will deny any dizziness during typical functional tasks. Baseline:  Goal status: IN PROGRESS (no dizziness reported on 03/26/22)   PLAN:  PT FREQUENCY: 2x/week  PT DURATION: 8 weeks  PLANNED INTERVENTIONS: Therapeutic exercises, Therapeutic activity, Neuromuscular re-education, Balance training, Gait training, Patient/Family education, Self Care, Joint mobilization, Joint manipulation, Stair training, Vestibular training, Canalith repositioning, Aquatic Therapy, Dry Needling, Electrical stimulation, Spinal manipulation, Spinal mobilization, Cryotherapy, Moist heat, Taping, Vasopneumatic device, Ultrasound, Ionotophoresis '4mg'$ /ml Dexamethasone, Manual therapy, and Re-evaluation  PLAN FOR NEXT SESSION: assess response to iontopatch, aquatic PT weekly, strengthening, flexibility   Juel Burrow, PT 05/06/22 9:45 AM   [facility] [address] [place],   [address] Phone # [phone] Fax [phone]

## 2022-05-08 ENCOUNTER — Encounter: Payer: Self-pay | Admitting: Physical Therapy

## 2022-05-08 ENCOUNTER — Ambulatory Visit: Payer: Medicare Other | Admitting: Physical Therapy

## 2022-05-08 DIAGNOSIS — M25562 Pain in left knee: Secondary | ICD-10-CM | POA: Diagnosis not present

## 2022-05-08 DIAGNOSIS — G8929 Other chronic pain: Secondary | ICD-10-CM

## 2022-05-08 DIAGNOSIS — R262 Difficulty in walking, not elsewhere classified: Secondary | ICD-10-CM

## 2022-05-08 DIAGNOSIS — M6281 Muscle weakness (generalized): Secondary | ICD-10-CM

## 2022-05-08 NOTE — Therapy (Signed)
OUTPATIENT PHYSICAL THERAPY TREATMENT NOTE    Patient Name: Kayla Rosales MRN: [id number] DOB:05/04/1955, 67 y.o., female Today's Date: 05/06/2022       PT End of Session - 05/06/22 0851     Visit Number 16    Date for PT Re-Evaluation 05/24/22    Authorization Type Medicare/[employer]    Progress Note Due on Visit 71    PT Start Time 0847    PT Stop Time 0928    PT Time Calculation (min) 41 min    Activity Tolerance Patient tolerated treatment well    Behavior During Therapy [facility] for tasks assessed/performed               Past Medical History:  Diagnosis Date   Allergy    Arthritis    Asthma    Back pain    Benign positional vertigo    Bilateral swelling of feet    Bronchitis    Chicken pox    Colon polyps    Constipation    Cough 10/09/2020   COVID-19 10/09/2020   Diverticulitis    Early menopause occurring in patient age younger than 76 years    Fibromyalgia    GERD (gastroesophageal reflux disease)    Gluten intolerance    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 Still unclear what is cuasing her pain. May be nerve related. May benefit from starting on gabapentin. Start gabapentin 300 mg TID. F/u 4 weeks   Night sweats    Palpitations    Recurrent upper respiratory infection (URI)    Right arm weakness 09/28/2015   Last Assessment & Plan:  Formatting of this note might be different from the original. Referral to PT today. Suspect residual after stroke in left basal ganglia   Sleep apnea    Snoring 01/04/2019   Stroke (cerebrum) ([place]) 2017   right UE/neck sx, resolved.    Vertigo 08/11/2015   Last Assessment & Plan:  Formatting of this note might be different from the original. Concern given gradual onset over the last week and a  half in addition to neck pain. Patient needs neuroimaging to rule out dissecting vertebral aneurysm. Patient advised to go to emergency room. She declined ambulance transport   Vitamin B 12 deficiency    Vitamin D deficiency    Past Surgical History:  Procedure Laterality Date   CARPAL TUNNEL RELEASE     COLONOSCOPY  2015   [name]- [facility], [place]; 5 yr repeat recommended   COLONOSCOPY W/ POLYPECTOMY  05/26/2018   x5 polyps   KNEE ARTHROSCOPY Bilateral 2013   meniscus  x2   NECK SURGERY  2018   disc fusion   UPPER GASTROINTESTINAL ENDOSCOPY     Patient Active Problem List   Diagnosis Date Noted   Vitamin D deficiency 01/21/2022   Other tear of medial meniscus, current injury, left knee, subsequent encounter 01/14/2022   Other adverse food reactions, not elsewhere classified, subsequent encounter 91/50/5697   Diastolic dysfunction 94/80/1655   Heart murmur 02/16/2019   Hyperlipidemia LDL goal <100 02/16/2019   Class 1 obesity with serious comorbidity and body mass index (BMI) of 30.0 to 30.9 in adult 01/04/2019   Pelvic floor dysfunction in female 12/06/2018   Constipation  due to outlet dysfunction 12/06/2018   Lactose intolerance 12/06/2018   Diverticulosis 05/27/2018   Osteopenia 01/19/2018   Irritable bowel syndrome with constipation 01/17/2018   History of colonic polyp 01/17/2018   History of stroke 01/15/2018   GERD (gastroesophageal reflux disease)    Asthma    S/P cervical spinal fusion 09/29/2017   Macrocytosis without anemia 11/25/2015   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   Heart palpitations 09/08/2015   Vitamin B12 deficiency 04/06/2014   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   Vestibular neuritis 10/15/2006   Family history of malignant neoplasm of breast 03/18/2006   Diaphragmatic hernia 03/18/2006   Premature menopause 03/18/2006    PCP: Ma Hillock, DO  REFERRING PROVIDER: Leandrew Koyanagi, MD   REFERRING DIAG: [phone] (ICD-10-CM) -  Primary osteoarthritis of left knee   THERAPY DIAG:  Chronic pain of left knee  Difficulty in walking, not elsewhere classified  Muscle weakness (generalized)  Rationale for Evaluation and Treatment Rehabilitation  ONSET DATE: 01/07/2022  SUBJECTIVE:   SUBJECTIVE STATEMENT:  I had a great day yesterday and last night. Maybe that patch helped!  PERTINENT HISTORY: CVA, allergies, osteopenia, diverticulosis, Hx of cervical fusion, vestibular neuritis/BPPV, lumbar herniated disc, sleep apnea PAIN:  Are you having pain? Yes: NPRS scale: 4/10 Pain location: left knee Pain description: sore Aggravating factors: movement Relieving factors: rest  PRECAUTIONS: None  WEIGHT BEARING RESTRICTIONS: No  FALLS:  Has patient fallen in last 6 months? No  LIVING ENVIRONMENT: Lives with: lives with their spouse Lives in: House/apartment Stairs: Yes: Internal: 15 steps; on left going up and External: 2 steps; none Has following equipment at home: None  OCCUPATION: Retired Education officer, museum  PLOF: Independent and Leisure: playing with grandchildren, sewing, gardening  PATIENT GOALS: To be able to walk and get some weight off and enjoy my grandchildren and go on hikes if I want to.  OBJECTIVE:   DIAGNOSTIC FINDINGS:  MRI of left knee on 01/24/22: IMPRESSION: 1. Severe attenuation of the body of the medial meniscus which may reflect prior meniscectomy versus maceration. Degeneration of the remainder of the anterior horn and posterior horns of the medial meniscus. Small oblique tear of the posterior horn of the medial meniscus extending to the superior articular surface. 2. Complex signal in the posterior horn of the lateral meniscus concerning for a tear. 3. Cartilage fissuring of the patellar apex. Partial-thickness cartilage loss of the patellofemoral compartment most severe in the medial patellofemoral compartment. 4. Extensive full-thickness cartilage loss of the  medial femorotibial compartment with subchondral reactive marrow edema in the medial tibial plateau.  PATIENT SURVEYS:  Eval:  FOTO 40% (projected 56% by visit 15) 03/26/2022:  FOTO 52%  COGNITION: Overall cognitive status: Within functional limits for tasks assessed     SENSATION: Reports some numbness/tingling down left LE  EDEMA:  Circumferential: right 43 cm, left 43.5 cm  MUSCLE LENGTH: Bilat hamstring tightness noted  POSTURE: rounded shoulders  PALPATION: Tenderness to palpation along right knee medial joint line  LOWER EXTREMITY ROM:  WFL  LOWER EXTREMITY MMT:  Eval:  Bilateral hips 4/5, Left quad/hamstring 4/5, right quad/hamstring 5/5 03/26/2022:  Bilateral hip strength of 4 to 4+/5, Left quad/hamstrings 4 to 4+/5  LOWER EXTREMITY SPECIAL TESTS:  Knee special tests: Anterior drawer test: negative, Posterior drawer test: positive , and Patellafemoral grind test: negative  FUNCTIONAL TESTS:  Eval:   5 times sit to stand: 20.3 sec with pushing up from thighs  02/19/2022: 3 Minute Walk Test:  643 ft with pain going up to 7/10 towards end of ambulation  02/27/2022: 5 times sit to stand: 13.1 sec with pushing up from thighs  03/26/2022: 3 Minute Walk Test: 714 ft with pain of 3/10 at end of ambulation 5 times sit to stand: 10.9 sec without UE use, but did use rocking momentum  GAIT: Distance walked: >100 ft Assistive device utilized: None Level of assistance: Complete Independence Comments: Antalgic gait pattern   TODAY'S TREATMENT:  05/08/22:Pt arrives for aquatic physical therapy. Treatment took place in 3.5-5.5 feet of water. Water temperature was 91 degrees F.. Pt entered the pool via stairs step to step with mild use of the rails. Pt requires buoyancy of water for support and to offload joints with strengthening exercises as well as utilizing the upthrust to challenge her LE and pelvic muscle strength.75% depth water walking with water bells 10  lengths in each direction. Standing hip flex/ext , abduction, and circles 20x Bil with no UE on pool wall for balance: ankle fins for resistance. Core press with double bouy 20x. Single leg stance 10 sec no UE static 1x, added UE movements 20x sagittal plane together then reciprocally Bil added water bells for resistance but no upthrust. VC to squeeze gluteals.  Standing knee extension Bil with small noodle 10x Bil. Standing high knee marching holding onto UE weight for more upthrust 6 lenghts . Yellow noodle LE decompression x 3 min at end of session and 5 min underwater bicycle4 min  DATE: 05/06/2022 NuStep: Level 5x 14 minutes-PT present to discuss progress Seated hamstring stretch 2x20 sec bilat FWD and lateral step ups 2x10 bilat with 6 inch step and UE support FWD and side partial lunges on bosu 2x10 each bilat Leg press (seat at 6) 70# 2x10 Rocker board x1 min Single leg stance on flat side of half bolster 2x20 sec bilat Standing at barre:  hip abduction and hip extension with blue loop x10 bilat FWD and backwards monster walking with blue loop 3 x 10 ft each Side walking with blue loop 3x10 ft bilat Iontopatch application to left knee for 4 hours with dextamethasone   DATE: 05/02/2022 NuStep: Level 5x 7 minutes-PT present to discuss progress Standing hamstring stretch 2x20 sec bilat FWD and lateral step ups x10 bilat with 6 inch step and UE support Iontopatch application to left knee for 4 hours with dextamethasone FWD and backwards monster walking with blue loop 3 x 10 ft each Side walking with blue loop 3x10 ft bilat FWD and side partial lunges on bosu x10 each bilat Rocker board x1 min Leg press (seat at 6) 70# 2x10 Standing hip extension with blue loop 2x10 bilat   PATIENT EDUCATION:  Education details: Issued HEP Person educated: Patient Education method: Explanation, Demonstration, and Handouts Education comprehension: verbalized understanding and returned  demonstration  HOME EXERCISE PROGRAM: Access Code: [email or web address] URL: [email or web address] Date: 04/17/2022 Prepared by: Shelby Dubin Menke  Exercises - Seated Hamstring Stretch  - 1 x daily - 7 x weekly - 1 sets - 2 reps - 20 sec hold - Seated Piriformis Stretch  - 1 x daily - 7 x weekly - 1 sets - 2 reps - 20 sec hold - Seated Long Arc Quad  - 1 x daily - 7 x weekly - 2 sets - 10 reps - Seated March  - 1 x daily - 7 x weekly - 2 sets - 10 reps - Seated Quad Set  - 1 x daily - 7 x weekly - 2  sets - 10 reps - Wall Squat  - 1 x daily - 7 x weekly - 2 sets - 10 reps - Standing Hip Abduction with Counter Support  - 1 x daily - 7 x weekly - 2 sets - 10 reps - Standing Hip Extension with Counter Support  - 1 x daily - 7 x weekly - 2 sets - 10 reps - Standing March with Counter Support  - 1 x daily - 7 x weekly - 2 sets - 10 reps  ASSESSMENT:  CLINICAL IMPRESSION: Pt arrives to aquatic PT with no knee pain. Pt feels the patch was helpful. Pt slept with no knee pain last night. Pt entered pool today with less use of handrails. Pt requires VC to not lock her LT knee out into TKE.This is very difficult for pt to do. Some improvement noted during session, better when she activates her core more.    OBJECTIVE IMPAIRMENTS: decreased balance, difficulty walking, decreased strength, impaired flexibility, and pain.   ACTIVITY LIMITATIONS: lifting, bending, squatting, stairs, and transfers  PARTICIPATION LIMITATIONS: cleaning, community activity, and yard work  PERSONAL FACTORS: Past/current experiences and 3+ comorbidities: CVA, vertigo, OA  are also affecting patient's functional outcome.   REHAB POTENTIAL: Good  CLINICAL DECISION MAKING: Stable/uncomplicated  EVALUATION COMPLEXITY: Moderate   GOALS: Goals reviewed with patient? Yes  SHORT TERM GOALS: Target date: 02/22/2022  Pt will be independent with initial HEP. Baseline: Goal status: MET on 02/27/2022  2.  Pt will decreased  time with 5 times sit to/from stand to 15 sec or less to demonstrate improved functional strength. Baseline: 20.3 sec Goal status: MET on 02/27/2022   LONG TERM GOALS: Target date: 05/24/2022   Pt will be independent with advanced HEP. Baseline:  Goal status: IN PROGRESS  2.  Pt will increase FOTO to at least 56% to demonstrate improvements in functional mobility. Baseline: 40% Goal status: IN PROGRESS  3.  Pt will increase bilateral hip and left knee strength to at least 4+ to 5/5 to allow her to navigate stairs in home with increased ease. Baseline:  Goal status: IN PROGRESS  4.  Pt will report ability to walk for greater than 20 minutes without an increase in knee pain to allow pt to return to walking with her family. Baseline:  Goal status: IN PROGRESS (ambulated for 2 miles over Thanksgiving and had increased pain noted)  5.  Pt will deny any dizziness during typical functional tasks. Baseline:  Goal status: IN PROGRESS (no dizziness reported on 03/26/22)   PLAN:  PT FREQUENCY: 2x/week  PT DURATION: 8 weeks  PLANNED INTERVENTIONS: Therapeutic exercises, Therapeutic activity, Neuromuscular re-education, Balance training, Gait training, Patient/Family education, Self Care, Joint mobilization, Joint manipulation, Stair training, Vestibular training, Canalith repositioning, Aquatic Therapy, Dry Needling, Electrical stimulation, Spinal manipulation, Spinal mobilization, Cryotherapy, Moist heat, Taping, Vasopneumatic device, Ultrasound, Ionotophoresis '4mg'$ /ml Dexamethasone, Manual therapy, and Re-evaluation  PLAN FOR NEXT SESSION: assess response to iontopatch, aquatic PT weekly, strengthening, flexibility   Myrene Galas, PTA 05/08/22 10:41 AM   [facility] [address] [place], [place] [address] Phone # [phone] Fax [phone]

## 2022-05-10 ENCOUNTER — Encounter (INDEPENDENT_AMBULATORY_CARE_PROVIDER_SITE_OTHER): Payer: Self-pay | Admitting: Internal Medicine

## 2022-05-13 ENCOUNTER — Ambulatory Visit: Payer: Medicare Other | Admitting: Rehabilitative and Restorative Service Providers"

## 2022-05-14 ENCOUNTER — Ambulatory Visit (INDEPENDENT_AMBULATORY_CARE_PROVIDER_SITE_OTHER): Payer: Medicare Other | Admitting: Internal Medicine

## 2022-05-14 NOTE — Therapy (Unsigned)
OUTPATIENT PHYSICAL THERAPY TREATMENT NOTE    Patient Name: Kayla Rosales MRN: [id number] DOB:1956-02-24, 67 y.o., female Today's Date: 05/15/2022       PT End of Session - 05/15/22 1527     Visit Number 18    Date for PT Re-Evaluation 05/24/22    Authorization Type Medicare/[employer]    Progress Note Due on Visit 29    PT Start Time 0935    PT Stop Time 1020    PT Time Calculation (min) 45 min    Activity Tolerance Patient tolerated treatment well               Past Medical History:  Diagnosis Date   Allergy    Arthritis    Asthma    Back pain    Benign positional vertigo    Bilateral swelling of feet    Bronchitis    Chicken pox    Colon polyps    Constipation    Cough 10/09/2020   COVID-19 10/09/2020   Diverticulitis    Early menopause occurring in patient age younger than 45 years    Fibromyalgia    GERD (gastroesophageal reflux disease)    Gluten intolerance    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 Still unclear what is cuasing her pain. May be nerve related. May benefit from starting on gabapentin. Start gabapentin 300 mg TID. F/u 4 weeks   Night sweats    Palpitations    Recurrent upper respiratory infection (URI)    Right arm weakness 09/28/2015   Last Assessment & Plan:  Formatting of this note might be different from the original. Referral to PT today. 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    Vertigo 08/11/2015   Last Assessment & Plan:  Formatting of this note might be different from the original. Concern given gradual onset over the last week and a half in addition to neck pain. Patient needs neuroimaging to  rule out dissecting vertebral aneurysm. Patient advised to go to emergency room. She declined ambulance transport   Vitamin B 12 deficiency    Vitamin D deficiency    Past Surgical History:  Procedure Laterality Date   CARPAL TUNNEL RELEASE     COLONOSCOPY  2015   Dr. Jean Rosenthal- [facility], [place]; 5 yr repeat recommended   COLONOSCOPY W/ POLYPECTOMY  05/26/2018   x5 polyps   KNEE ARTHROSCOPY Bilateral 2013   meniscus  x2   NECK SURGERY  2018   disc fusion   UPPER GASTROINTESTINAL ENDOSCOPY     Patient Active Problem List   Diagnosis Date Noted   Vitamin D deficiency 01/21/2022   Other tear of medial meniscus, current injury, left knee, subsequent encounter 01/14/2022   Other adverse food reactions, not elsewhere classified, subsequent encounter 03/28/2021   Diastolic dysfunction 08/02/2019   Heart murmur 02/16/2019   Hyperlipidemia LDL goal <100 02/16/2019   Class 1 obesity with serious comorbidity and body mass index (BMI) of 30.0 to 30.9 in adult 01/04/2019   Pelvic floor dysfunction in female 12/06/2018   Constipation due to outlet dysfunction 12/06/2018   Lactose intolerance 12/06/2018  Diverticulosis 05/27/2018   Osteopenia 01/19/2018   Irritable bowel syndrome with constipation 01/17/2018   History of colonic polyp 01/17/2018   History of stroke 01/15/2018   GERD (gastroesophageal reflux disease)    Asthma    S/P cervical spinal fusion 09/29/2017   Macrocytosis without anemia 11/25/2015   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   Heart palpitations 09/08/2015   Vitamin B12 deficiency 04/06/2014   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   Vestibular neuritis 10/15/2006   Family history of malignant neoplasm of breast 03/18/2006   Diaphragmatic hernia 03/18/2006   Premature menopause 03/18/2006    PCP: Natalia Leatherwood, DO  REFERRING PROVIDER: Tarry Kos, MD   REFERRING DIAG: [phone] (ICD-10-CM) - Primary osteoarthritis of left knee   THERAPY DIAG:   Difficulty in walking, not elsewhere classified  Chronic pain of left knee  Muscle weakness (generalized)  Rationale for Evaluation and Treatment Rehabilitation  ONSET DATE: 01/07/2022  SUBJECTIVE:   SUBJECTIVE STATEMENT:  I had to cancel my land appt because something came up last minute. Knee doing ok, about the same as last week which was good. I think I lock my knee when I walk PERTINENT HISTORY: CVA, allergies, osteopenia, diverticulosis, Hx of cervical fusion, vestibular neuritis/BPPV, lumbar herniated disc, sleep apnea PAIN:  Are you having pain? Yes: NPRS scale: 3/10 Pain location: left knee Pain description: sore Aggravating factors: movement Relieving factors: rest  PRECAUTIONS: None  WEIGHT BEARING RESTRICTIONS: No  FALLS:  Has patient fallen in last 6 months? No  LIVING ENVIRONMENT: Lives with: lives with their spouse Lives in: House/apartment Stairs: Yes: Internal: 15 steps; on left going up and External: 2 steps; none Has following equipment at home: None  OCCUPATION: Retired Engineer, site  PLOF: Independent and Leisure: playing with grandchildren, sewing, gardening  PATIENT GOALS: To be able to walk and get some weight off and enjoy my grandchildren and go on hikes if I want to.  OBJECTIVE:   DIAGNOSTIC FINDINGS:  MRI of left knee on 01/24/22: IMPRESSION: 1. Severe attenuation of the body of the medial meniscus which may reflect prior meniscectomy versus maceration. Degeneration of the remainder of the anterior horn and posterior horns of the medial meniscus. Small oblique tear of the posterior horn of the medial meniscus extending to the superior articular surface. 2. Complex signal in the posterior horn of the lateral meniscus concerning for a tear. 3. Cartilage fissuring of the patellar apex. Partial-thickness cartilage loss of the patellofemoral compartment most severe in the medial patellofemoral compartment. 4. Extensive full-thickness  cartilage loss of the medial femorotibial compartment with subchondral reactive marrow edema in the medial tibial plateau.  PATIENT SURVEYS:  Eval:  FOTO 40% (projected 56% by visit 15) 03/26/2022:  FOTO 52%  COGNITION: Overall cognitive status: Within functional limits for tasks assessed     SENSATION: Reports some numbness/tingling down left LE  EDEMA:  Circumferential: right 43 cm, left 43.5 cm  MUSCLE LENGTH: Bilat hamstring tightness noted  POSTURE: rounded shoulders  PALPATION: Tenderness to palpation along right knee medial joint line  LOWER EXTREMITY ROM:  WFL  LOWER EXTREMITY MMT:  Eval:  Bilateral hips 4/5, Left quad/hamstring 4/5, right quad/hamstring 5/5 03/26/2022:  Bilateral hip strength of 4 to 4+/5, Left quad/hamstrings 4 to 4+/5  LOWER EXTREMITY SPECIAL TESTS:  Knee special tests: Anterior drawer test: negative, Posterior drawer test: positive , and Patellafemoral grind test: negative  FUNCTIONAL TESTS:  Eval:   5 times sit to stand: 20.3 sec with pushing up from  thighs  02/19/2022: 3 Minute Walk Test:  643 ft with pain going up to 7/10 towards end of ambulation  02/27/2022: 5 times sit to stand: 13.1 sec with pushing up from thighs  03/26/2022: 3 Minute Walk Test: 714 ft with pain of 3/10 at end of ambulation 5 times sit to stand: 10.9 sec without UE use, but did use rocking momentum  GAIT: Distance walked: >100 ft Assistive device utilized: None Level of assistance: Complete Independence Comments: Antalgic gait pattern   TODAY'S TREATMENT:  05/15/22:Pt arrives for aquatic physical therapy. Treatment took place in 3.5-5.5 feet of water. Water temperature was 91 degrees F.. Pt entered the pool via stairs step to step with mild use of the rails. Pt requires buoyancy of water for support and to offload joints with strengthening exercises as well as utilizing the upthrust to challenge her LE and pelvic muscle strength.75% depth water walking  with water bells 10 lengths in each direction. Standing hip flex/ext , abduction, and circles 20x Bil with no UE on pool wall for balance: ankle fins for resistance. Core press with double bouy 20xhen walked across pool 4x, Single leg stance 10 sec no UE static 1x, added UE movements 20x sagittal plane together then reciprocally Bil added water bells for resistance but no upthrust. VC to squeeze gluteals.  Standing knee extension Bil with small noodle 10x2 Bil. Standing high knee marching holding onto UE weight for more upthrust 6 lenghts . Yellow noodle LE decompression x 3 min at end of session and 5 min underwater bicycle.  05/08/22:Pt arrives for aquatic physical therapy. Treatment took place in 3.5-5.5 feet of water. Water temperature was 91 degrees F.. Pt entered the pool via stairs step to step with mild use of the rails. Pt requires buoyancy of water for support and to offload joints with strengthening exercises as well as utilizing the upthrust to challenge her LE and pelvic muscle strength.75% depth water walking with water bells 10 lengths in each direction. Standing hip flex/ext , abduction, and circles 20x Bil with no UE on pool wall for balance: ankle fins for resistance. Core press with double bouy 20x. Single leg stance 10 sec no UE static 1x, added UE movements 20x sagittal plane together then reciprocally Bil added water bells for resistance but no upthrust. VC to squeeze gluteals.  Standing knee extension Bil with small noodle 10x Bil. Standing high knee marching holding onto UE weight for more upthrust 6 lenghts . Yellow noodle LE decompression x 3 min at end of session and 5 min underwater bicycle4 min  DATE: 05/06/2022 NuStep: Level 5x 14 minutes-PT present to discuss progress Seated hamstring stretch 2x20 sec bilat FWD and lateral step ups 2x10 bilat with 6 inch step and UE support FWD and side partial lunges on bosu 2x10 each bilat Leg press (seat at 6) 70# 2x10 Rocker board x1  min Single leg stance on flat side of half bolster 2x20 sec bilat Standing at barre:  hip abduction and hip extension with blue loop x10 bilat FWD and backwards monster walking with blue loop 3 x 10 ft each Side walking with blue loop 3x10 ft bilat Iontopatch application to left knee for 4 hours with dextamethasone    PATIENT EDUCATION:  Education details: Issued HEP Person educated: Patient Education method: Explanation, Facilities manager, and Handouts Education comprehension: verbalized understanding and returned demonstration  HOME EXERCISE PROGRAM: Access Code: [email or web address] URL: [email or web address] Date: 04/17/2022 Prepared by: Reather Laurence  Exercises - Seated Hamstring Stretch  -  1 x daily - 7 x weekly - 1 sets - 2 reps - 20 sec hold - Seated Piriformis Stretch  - 1 x daily - 7 x weekly - 1 sets - 2 reps - 20 sec hold - Seated Long Arc Quad  - 1 x daily - 7 x weekly - 2 sets - 10 reps - Seated March  - 1 x daily - 7 x weekly - 2 sets - 10 reps - Seated Quad Set  - 1 x daily - 7 x weekly - 2 sets - 10 reps - Wall Squat  - 1 x daily - 7 x weekly - 2 sets - 10 reps - Standing Hip Abduction with Counter Support  - 1 x daily - 7 x weekly - 2 sets - 10 reps - Standing Hip Extension with Counter Support  - 1 x daily - 7 x weekly - 2 sets - 10 reps - Standing March with Counter Support  - 1 x daily - 7 x weekly - 2 sets - 10 reps  ASSESSMENT:  CLINICAL IMPRESSION: Pt arrives to aquatic PT with  little knee pain. Pt slept with no knee pain last night. Pt entered pool today with less use of handrails. Pt still requires VC to not lock her LT knee out into TKE.This is very difficult for pt to do. Some improvement noted during session, better when she activates her core more.    OBJECTIVE IMPAIRMENTS: decreased balance, difficulty walking, decreased strength, impaired flexibility, and pain.   ACTIVITY LIMITATIONS: lifting, bending, squatting, stairs, and  transfers  PARTICIPATION LIMITATIONS: cleaning, community activity, and yard work  PERSONAL FACTORS: Past/current experiences and 3+ comorbidities: CVA, vertigo, OA  are also affecting patient's functional outcome.   REHAB POTENTIAL: Good  CLINICAL DECISION MAKING: Stable/uncomplicated  EVALUATION COMPLEXITY: Moderate   GOALS: Goals reviewed with patient? Yes  SHORT TERM GOALS: Target date: 02/22/2022  Pt will be independent with initial HEP. Baseline: Goal status: MET on 02/27/2022  2.  Pt will decreased time with 5 times sit to/from stand to 15 sec or less to demonstrate improved functional strength. Baseline: 20.3 sec Goal status: MET on 02/27/2022   LONG TERM GOALS: Target date: 05/24/2022   Pt will be independent with advanced HEP. Baseline:  Goal status: IN PROGRESS  2.  Pt will increase FOTO to at least 56% to demonstrate improvements in functional mobility. Baseline: 40% Goal status: IN PROGRESS  3.  Pt will increase bilateral hip and left knee strength to at least 4+ to 5/5 to allow her to navigate stairs in home with increased ease. Baseline:  Goal status: IN PROGRESS  4.  Pt will report ability to walk for greater than 20 minutes without an increase in knee pain to allow pt to return to walking with her family. Baseline:  Goal status: IN PROGRESS (ambulated for 2 miles over Thanksgiving and had increased pain noted)  5.  Pt will deny any dizziness during typical functional tasks. Baseline:  Goal status: IN PROGRESS (no dizziness reported on 03/26/22)   PLAN:  PT FREQUENCY: 2x/week  PT DURATION: 8 weeks  PLANNED INTERVENTIONS: Therapeutic exercises, Therapeutic activity, Neuromuscular re-education, Balance training, Gait training, Patient/Family education, Self Care, Joint mobilization, Joint manipulation, Stair training, Vestibular training, Canalith repositioning, Aquatic Therapy, Dry Needling, Electrical stimulation, Spinal manipulation, Spinal  mobilization, Cryotherapy, Moist heat, Taping, Vasopneumatic device, Ultrasound, Ionotophoresis 4mg /ml Dexamethasone, Manual therapy, and Re-evaluation  PLAN FOR NEXT SESSION: assess response to iontopatch, aquatic PT weekly, strengthening, flexibility  Ane Payment, PTA 05/15/22 3:28 PM   [facility] [address], [address] [place], [place] [address] Phone # [phone] Fax [phone]

## 2022-05-15 ENCOUNTER — Ambulatory Visit: Payer: Medicare Other | Admitting: Physical Therapy

## 2022-05-15 ENCOUNTER — Encounter: Payer: Self-pay | Admitting: Physical Therapy

## 2022-05-15 DIAGNOSIS — M6281 Muscle weakness (generalized): Secondary | ICD-10-CM

## 2022-05-15 DIAGNOSIS — R262 Difficulty in walking, not elsewhere classified: Secondary | ICD-10-CM

## 2022-05-15 DIAGNOSIS — G8929 Other chronic pain: Secondary | ICD-10-CM

## 2022-05-15 DIAGNOSIS — M25562 Pain in left knee: Secondary | ICD-10-CM | POA: Diagnosis not present

## 2022-05-22 ENCOUNTER — Encounter: Payer: Self-pay | Admitting: Physical Therapy

## 2022-05-22 ENCOUNTER — Ambulatory Visit: Payer: Medicare Other | Attending: Orthopaedic Surgery | Admitting: Physical Therapy

## 2022-05-22 DIAGNOSIS — M6281 Muscle weakness (generalized): Secondary | ICD-10-CM | POA: Diagnosis present

## 2022-05-22 DIAGNOSIS — G8929 Other chronic pain: Secondary | ICD-10-CM | POA: Diagnosis present

## 2022-05-22 DIAGNOSIS — M25562 Pain in left knee: Secondary | ICD-10-CM | POA: Insufficient documentation

## 2022-05-22 DIAGNOSIS — R262 Difficulty in walking, not elsewhere classified: Secondary | ICD-10-CM

## 2022-05-22 NOTE — Therapy (Signed)
OUTPATIENT PHYSICAL THERAPY TREATMENT NOTE    Patient Name: Kayla Rosales MRN: [id number] DOB:Aug 14, 1955, 67 y.o., female Today's Date: 05/22/2022       PT End of Session - 05/22/22 0934     Visit Number 19    Date for PT Re-Evaluation 05/24/22    Authorization Type Medicare/[employer]    Progress Note Due on Visit 63    PT Start Time 0933    PT Stop Time 1015    PT Time Calculation (min) 42 min    Activity Tolerance Patient tolerated treatment well               Past Medical History:  Diagnosis Date   Allergy    Arthritis    Asthma    Back pain    Benign positional vertigo    Bilateral swelling of feet    Bronchitis    Chicken pox    Colon polyps    Constipation    Cough 10/09/2020   COVID-19 10/09/2020   Diverticulitis    Early menopause occurring in patient age younger than 73 years    Fibromyalgia    GERD (gastroesophageal reflux disease)    Gluten intolerance    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 Still unclear what is cuasing her pain. May be nerve related. May benefit from starting on gabapentin. Start gabapentin 300 mg TID. F/u 4 weeks   Night sweats    Palpitations    Recurrent upper respiratory infection (URI)    Right arm weakness 09/28/2015   Last Assessment & Plan:  Formatting of this note might be different from the original. Referral to PT today. Suspect residual after stroke in left basal ganglia   Sleep apnea    Snoring 01/04/2019   Stroke (cerebrum) ([place]) 2017   right UE/neck sx, resolved.    Vertigo 08/11/2015   Last Assessment & Plan:  Formatting of this note might be different from the original. Concern given gradual onset over the last week and a half in addition to neck pain. Patient needs neuroimaging to rule  out dissecting vertebral aneurysm. Patient advised to go to emergency room. She declined ambulance transport   Vitamin B 12 deficiency    Vitamin D deficiency    Past Surgical History:  Procedure Laterality Date   CARPAL TUNNEL RELEASE     COLONOSCOPY  2015   [name]- [facility], [place]; 5 yr repeat recommended   COLONOSCOPY W/ POLYPECTOMY  05/26/2018   x5 polyps   KNEE ARTHROSCOPY Bilateral 2013   meniscus  x2   NECK SURGERY  2018   disc fusion   UPPER GASTROINTESTINAL ENDOSCOPY     Patient Active Problem List   Diagnosis Date Noted   Vitamin D deficiency 01/21/2022   Other tear of medial meniscus, current injury, left knee, subsequent encounter 01/14/2022   Other adverse food reactions, not elsewhere classified, subsequent encounter 73/41/9379   Diastolic dysfunction 02/40/9735   Heart murmur 02/16/2019   Hyperlipidemia LDL goal <100 02/16/2019   Class 1 obesity with serious comorbidity and body mass index (BMI) of 30.0 to 30.9 in adult 01/04/2019   Pelvic floor dysfunction in female 12/06/2018   Constipation due to outlet dysfunction 12/06/2018   Lactose intolerance 12/06/2018  Diverticulosis 05/27/2018   Osteopenia 01/19/2018   Irritable bowel syndrome with constipation 01/17/2018   History of colonic polyp 01/17/2018   History of stroke 01/15/2018   GERD (gastroesophageal reflux disease)    Asthma    S/P cervical spinal fusion 09/29/2017   Macrocytosis without anemia 11/25/2015   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   Heart palpitations 09/08/2015   Vitamin B12 deficiency 04/06/2014   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   Vestibular neuritis 10/15/2006   Family history of malignant neoplasm of breast 03/18/2006   Diaphragmatic hernia 03/18/2006   Premature menopause 03/18/2006    PCP: Ma Hillock, DO  REFERRING PROVIDER: Leandrew Koyanagi, MD   REFERRING DIAG: [phone] (ICD-10-CM) - Primary osteoarthritis of left knee   THERAPY DIAG:  Difficulty in  walking, not elsewhere classified  Chronic pain of left knee  Muscle weakness (generalized)  Rationale for Evaluation and Treatment Rehabilitation  ONSET DATE: 01/07/2022  SUBJECTIVE:   SUBJECTIVE STATEMENT:  I am very discouraged. On Saturday I went to a basketball game and had to walk up bleacher steps ( and down them) and my knee was not good after at all. It swelled and hurt in the back of my knee again. It was better the next day but I am thinking of getting the injection now.  PERTINENT HISTORY: CVA, allergies, osteopenia, diverticulosis, Hx of cervical fusion, vestibular neuritis/BPPV, lumbar herniated disc, sleep apnea PAIN:  Are you having pain? Yes: NPRS scale: 4/10 Pain location: left knee Pain description: sore Aggravating factors: movement Relieving factors: rest  PRECAUTIONS: None  WEIGHT BEARING RESTRICTIONS: No  FALLS:  Has patient fallen in last 6 months? No  LIVING ENVIRONMENT: Lives with: lives with their spouse Lives in: House/apartment Stairs: Yes: Internal: 15 steps; on left going up and External: 2 steps; none Has following equipment at home: None  OCCUPATION: Retired Education officer, museum  PLOF: Independent and Leisure: playing with grandchildren, sewing, gardening  PATIENT GOALS: To be able to walk and get some weight off and enjoy my grandchildren and go on hikes if I want to.  OBJECTIVE:   DIAGNOSTIC FINDINGS:  MRI of left knee on 01/24/22: IMPRESSION: 1. Severe attenuation of the body of the medial meniscus which may reflect prior meniscectomy versus maceration. Degeneration of the remainder of the anterior horn and posterior horns of the medial meniscus. Small oblique tear of the posterior horn of the medial meniscus extending to the superior articular surface. 2. Complex signal in the posterior horn of the lateral meniscus concerning for a tear. 3. Cartilage fissuring of the patellar apex. Partial-thickness cartilage loss of the  patellofemoral compartment most severe in the medial patellofemoral compartment. 4. Extensive full-thickness cartilage loss of the medial femorotibial compartment with subchondral reactive marrow edema in the medial tibial plateau.  PATIENT SURVEYS:  Eval:  FOTO 40% (projected 56% by visit 15) 03/26/2022:  FOTO 52%  COGNITION: Overall cognitive status: Within functional limits for tasks assessed     SENSATION: Reports some numbness/tingling down left LE  EDEMA:  Circumferential: right 43 cm, left 43.5 cm  MUSCLE LENGTH: Bilat hamstring tightness noted  POSTURE: rounded shoulders  PALPATION: Tenderness to palpation along right knee medial joint line  LOWER EXTREMITY ROM:  WFL  LOWER EXTREMITY MMT:  Eval:  Bilateral hips 4/5, Left quad/hamstring 4/5, right quad/hamstring 5/5 03/26/2022:  Bilateral hip strength of 4 to 4+/5, Left quad/hamstrings 4 to 4+/5  LOWER EXTREMITY SPECIAL TESTS:  Knee special tests: Anterior drawer test: negative, Posterior drawer test:  positive , and Patellafemoral grind test: negative  FUNCTIONAL TESTS:  Eval:   5 times sit to stand: 20.3 sec with pushing up from thighs  02/19/2022: 3 Minute Walk Test:  643 ft with pain going up to 7/10 towards end of ambulation  02/27/2022: 5 times sit to stand: 13.1 sec with pushing up from thighs  03/26/2022: 3 Minute Walk Test: 714 ft with pain of 3/10 at end of ambulation 5 times sit to stand: 10.9 sec without UE use, but did use rocking momentum  GAIT: Distance walked: >100 ft Assistive device utilized: None Level of assistance: Complete Independence Comments: Antalgic gait pattern   TODAY'S TREATMENT:  05/22/22:Pt arrives for aquatic physical therapy. Treatment took place in 3.5-5.5 feet of water. Water temperature was 91 degrees F.. Pt entered the pool via stairs step to step with mild use of the rails. Pt requires buoyancy of water for support and to offload joints with strengthening  exercises as well as utilizing the upthrust to challenge her LE and pelvic muscle strength.75% depth water walking with water bells 10 lengths in each direction. Standing hip flex/ext , abduction, and circles 20x Bil with no UE on pool wall for balance: ankle fins for resistance. Core press with double bouy 20xhen walked across pool. Standing knee extension Bil with small noodle 10x2 Bil. Standing high knee marching holding onto UE weight for more upthrust 6 lenghts . Yellow noodle LE decompression x 3 min at end of session and 5 min underwater bicycle.  05/15/22:Pt arrives for aquatic physical therapy. Treatment took place in 3.5-5.5 feet of water. Water temperature was 91 degrees F.. Pt entered the pool via stairs step to step with mild use of the rails. Pt requires buoyancy of water for support and to offload joints with strengthening exercises as well as utilizing the upthrust to challenge her LE and pelvic muscle strength.75% depth water walking with water bells 10 lengths in each direction. Standing hip flex/ext , abduction, and circles 20x Bil with no UE on pool wall for balance: ankle fins for resistance. Core press with double bouy 20xhen walked across pool 4x, Single leg stance 10 sec no UE static 1x, added UE movements 20x sagittal plane together then reciprocally Bil added water bells for resistance but no upthrust. VC to squeeze gluteals.  Standing knee extension Bil with small noodle 10x2 Bil. Standing high knee marching holding onto UE weight for more upthrust 6 lenghts . Yellow noodle LE decompression x 3 min at end of session and 5 min underwater bicycle.  05/08/22:Pt arrives for aquatic physical therapy. Treatment took place in 3.5-5.5 feet of water. Water temperature was 91 degrees F.. Pt entered the pool via stairs step to step with mild use of the rails. Pt requires buoyancy of water for support and to offload joints with strengthening exercises as well as utilizing the upthrust to challenge  her LE and pelvic muscle strength.75% depth water walking with water bells 10 lengths in each direction. Standing hip flex/ext , abduction, and circles 20x Bil with no UE on pool wall for balance: ankle fins for resistance. Core press with double bouy 20x. Single leg stance 10 sec no UE static 1x, added UE movements 20x sagittal plane together then reciprocally Bil added water bells for resistance but no upthrust. VC to squeeze gluteals.  Standing knee extension Bil with small noodle 10x Bil. Standing high knee marching holding onto UE weight for more upthrust 6 lenghts . Yellow noodle LE decompression x 3 min  at end of session and 5 min underwater bicycle4 min   PATIENT EDUCATION:  Education details: Issued HEP Person educated: Patient Education method: Explanation, Demonstration, and Handouts Education comprehension: verbalized understanding and returned demonstration  HOME EXERCISE PROGRAM: Access Code: [email or web address] URL: [email or web address] Date: 04/17/2022 Prepared by: Shelby Dubin Menke  Exercises - Seated Hamstring Stretch  - 1 x daily - 7 x weekly - 1 sets - 2 reps - 20 sec hold - Seated Piriformis Stretch  - 1 x daily - 7 x weekly - 1 sets - 2 reps - 20 sec hold - Seated Long Arc Quad  - 1 x daily - 7 x weekly - 2 sets - 10 reps - Seated March  - 1 x daily - 7 x weekly - 2 sets - 10 reps - Seated Quad Set  - 1 x daily - 7 x weekly - 2 sets - 10 reps - Wall Squat  - 1 x daily - 7 x weekly - 2 sets - 10 reps - Standing Hip Abduction with Counter Support  - 1 x daily - 7 x weekly - 2 sets - 10 reps - Standing Hip Extension with Counter Support  - 1 x daily - 7 x weekly - 2 sets - 10 reps - Standing March with Counter Support  - 1 x daily - 7 x weekly - 2 sets - 10 reps  ASSESSMENT:  CLINICAL IMPRESSION: Pt arrives with still mild knee pain but she had a flare up on Saturday climbing school bleachers. She is frustrated that she has to "take it easy and rest and recovery again."  Water exercises did not induce any further pain but she did report "feeling" her RT knee during the exercises. She is considering asking the MD for an injection.   OBJECTIVE IMPAIRMENTS: decreased balance, difficulty walking, decreased strength, impaired flexibility, and pain.   ACTIVITY LIMITATIONS: lifting, bending, squatting, stairs, and transfers  PARTICIPATION LIMITATIONS: cleaning, community activity, and yard work  PERSONAL FACTORS: Past/current experiences and 3+ comorbidities: CVA, vertigo, OA  are also affecting patient's functional outcome.   REHAB POTENTIAL: Good  CLINICAL DECISION MAKING: Stable/uncomplicated  EVALUATION COMPLEXITY: Moderate   GOALS: Goals reviewed with patient? Yes  SHORT TERM GOALS: Target date: 02/22/2022  Pt will be independent with initial HEP. Baseline: Goal status: MET on 02/27/2022  2.  Pt will decreased time with 5 times sit to/from stand to 15 sec or less to demonstrate improved functional strength. Baseline: 20.3 sec Goal status: MET on 02/27/2022   LONG TERM GOALS: Target date: 05/24/2022   Pt will be independent with advanced HEP. Baseline:  Goal status: IN PROGRESS  2.  Pt will increase FOTO to at least 56% to demonstrate improvements in functional mobility. Baseline: 40% Goal status: IN PROGRESS  3.  Pt will increase bilateral hip and left knee strength to at least 4+ to 5/5 to allow her to navigate stairs in home with increased ease. Baseline:  Goal status: IN PROGRESS  4.  Pt will report ability to walk for greater than 20 minutes without an increase in knee pain to allow pt to return to walking with her family. Baseline:  Goal status: IN PROGRESS (ambulated for 2 miles over Thanksgiving and had increased pain noted)  5.  Pt will deny any dizziness during typical functional tasks. Baseline:  Goal status: IN PROGRESS (no dizziness reported on 03/26/22)   PLAN:  PT FREQUENCY: 2x/week  PT DURATION: 8 weeks  PLANNED  INTERVENTIONS: Therapeutic exercises, Therapeutic  activity, Neuromuscular re-education, Balance training, Gait training, Patient/Family education, Self Care, Joint mobilization, Joint manipulation, Stair training, Vestibular training, Canalith repositioning, Aquatic Therapy, Dry Needling, Electrical stimulation, Spinal manipulation, Spinal mobilization, Cryotherapy, Moist heat, Taping, Vasopneumatic device, Ultrasound, Ionotophoresis '4mg'$ /ml Dexamethasone, Manual therapy, and Re-evaluation  PLAN FOR NEXT SESSION: Consider another Ionto patch. Re-assess.  Myrene Galas, PTA 05/22/22 4:13 PM   [facility] [address], [address] [place], [place] [address] Phone # [phone] Fax [phone]

## 2022-05-23 ENCOUNTER — Ambulatory Visit: Payer: Medicare Other | Admitting: Rehabilitative and Restorative Service Providers"

## 2022-05-23 ENCOUNTER — Encounter: Payer: Self-pay | Admitting: Rehabilitative and Restorative Service Providers"

## 2022-05-23 DIAGNOSIS — R262 Difficulty in walking, not elsewhere classified: Secondary | ICD-10-CM | POA: Diagnosis not present

## 2022-05-23 DIAGNOSIS — M6281 Muscle weakness (generalized): Secondary | ICD-10-CM

## 2022-05-23 DIAGNOSIS — G8929 Other chronic pain: Secondary | ICD-10-CM

## 2022-05-23 NOTE — Therapy (Signed)
OUTPATIENT PHYSICAL THERAPY TREATMENT NOTE AND DISCHARGE SUMMARY   Patient Name: Kayla Rosales MRN: [id number] DOB:[birth year], 67 y.o., female Today's Date: 05/23/2022       PT End of Session - 05/23/22 0851     Visit Number 20    Date for PT Re-Evaluation 05/24/22    Authorization Type Medicare/[employer]    Progress Note Due on Visit 75    PT Start Time 0847    PT Stop Time 0927    PT Time Calculation (min) 40 min    Activity Tolerance Patient tolerated treatment well    Behavior During Therapy [facility] for tasks assessed/performed               Past Medical History:  Diagnosis Date   Allergy    Arthritis    Asthma    Back pain    Benign positional vertigo    Bilateral swelling of feet    Bronchitis    Chicken pox    Colon polyps    Constipation    Cough 10/09/2020   COVID-19 10/09/2020   Diverticulitis    Early menopause occurring in patient age younger than 53 years    Fibromyalgia    GERD (gastroesophageal reflux disease)    Gluten intolerance    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 Still unclear what is cuasing her pain. May be nerve related. May benefit from starting on gabapentin. Start gabapentin 300 mg TID. F/u 4 weeks   Night sweats    Palpitations    Recurrent upper respiratory infection (URI)    Right arm weakness 09/28/2015   Last Assessment & Plan:  Formatting of this note might be different from the original. Referral to PT today. 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    Vertigo 08/11/2015   Last Assessment & Plan:  Formatting of this note might be different from the original. Concern given gradual onset over the  last week and a half in addition to neck pain. Patient needs neuroimaging to rule out dissecting vertebral aneurysm. Patient advised to go to emergency room. She declined ambulance transport   Vitamin B 12 deficiency    Vitamin D deficiency    Past Surgical History:  Procedure Laterality Date   CARPAL TUNNEL RELEASE     COLONOSCOPY  2015   [name]- [facility], [place]; 5 yr repeat recommended   COLONOSCOPY W/ POLYPECTOMY  05/26/2018   x5 polyps   KNEE ARTHROSCOPY Bilateral 2013   meniscus  x2   NECK SURGERY  2018   disc fusion   UPPER GASTROINTESTINAL ENDOSCOPY     Patient Active Problem List   Diagnosis Date Noted   Vitamin D deficiency 01/21/2022   Other tear of medial meniscus, current injury, left knee, subsequent encounter 01/14/2022   Other adverse food reactions, not elsewhere classified, subsequent encounter 54/27/0623   Diastolic dysfunction 76/28/3151   Heart murmur 02/16/2019   Hyperlipidemia LDL goal <100 02/16/2019   Class 1 obesity with serious comorbidity and body mass index (BMI) of 30.0 to 30.9 in adult 01/04/2019   Pelvic floor dysfunction in female 12/06/2018  Constipation due to outlet dysfunction 12/06/2018   Lactose intolerance 12/06/2018   Diverticulosis 05/27/2018   Osteopenia 01/19/2018   Irritable bowel syndrome with constipation 01/17/2018   History of colonic polyp 01/17/2018   History of stroke 01/15/2018   GERD (gastroesophageal reflux disease)    Asthma    S/P cervical spinal fusion 09/29/2017   Macrocytosis without anemia 11/25/2015   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   Heart palpitations 09/08/2015   Vitamin B12 deficiency 04/06/2014   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   Vestibular neuritis 10/15/2006   Family history of malignant neoplasm of breast 03/18/2006   Diaphragmatic hernia 03/18/2006   Premature menopause 03/18/2006    PCP: Ma Hillock, DO  REFERRING PROVIDER: Leandrew Koyanagi, MD   REFERRING DIAG: [phone]  (ICD-10-CM) - Primary osteoarthritis of left knee   THERAPY DIAG:  Difficulty in walking, not elsewhere classified  Chronic pain of left knee  Muscle weakness (generalized)  Rationale for Evaluation and Treatment Rehabilitation  ONSET DATE: 01/07/2022  SUBJECTIVE:   SUBJECTIVE STATEMENT:   Pt reports that she is feeling better after navigating the bleachers over the weekend.  States that she still has some stiffness, but overall is better.  Reports that she is ready for discharge from PT, but possibly wants to pursue an injection with the ortho.  Pt reports that she was able to get up and down from the floor with her grandchildren easier this weekend.  PERTINENT HISTORY: CVA, allergies, osteopenia, diverticulosis, Hx of cervical fusion, vestibular neuritis/BPPV, lumbar herniated disc, sleep apnea PAIN:  Are you having pain? Yes: NPRS scale: 2/10 Pain location: left knee Pain description: sore Aggravating factors: movement Relieving factors: rest  PRECAUTIONS: None  WEIGHT BEARING RESTRICTIONS: No  FALLS:  Has patient fallen in last 6 months? No  LIVING ENVIRONMENT: Lives with: lives with their spouse Lives in: House/apartment Stairs: Yes: Internal: 15 steps; on left going up and External: 2 steps; none Has following equipment at home: None  OCCUPATION: Retired Education officer, museum  PLOF: Independent and Leisure: playing with grandchildren, sewing, gardening  PATIENT GOALS: To be able to walk and get some weight off and enjoy my grandchildren and go on hikes if I want to.  OBJECTIVE:   DIAGNOSTIC FINDINGS:  MRI of left knee on 01/24/22: IMPRESSION: 1. Severe attenuation of the body of the medial meniscus which may reflect prior meniscectomy versus maceration. Degeneration of the remainder of the anterior horn and posterior horns of the medial meniscus. Small oblique tear of the posterior horn of the medial meniscus extending to the superior articular surface. 2.  Complex signal in the posterior horn of the lateral meniscus concerning for a tear. 3. Cartilage fissuring of the patellar apex. Partial-thickness cartilage loss of the patellofemoral compartment most severe in the medial patellofemoral compartment. 4. Extensive full-thickness cartilage loss of the medial femorotibial compartment with subchondral reactive marrow edema in the medial tibial plateau.  PATIENT SURVEYS:  Eval:  FOTO 40% (projected 56% by visit 15) 03/26/2022:  FOTO 52% 05/23/2022:  FOTO 72%  COGNITION: Overall cognitive status: Within functional limits for tasks assessed     SENSATION: Reports some numbness/tingling down left LE  EDEMA:  Circumferential: right 43 cm, left 43.5 cm  MUSCLE LENGTH: Bilat hamstring tightness noted  POSTURE: rounded shoulders  PALPATION: Tenderness to palpation along right knee medial joint line  LOWER EXTREMITY ROM:  WFL  LOWER EXTREMITY MMT:  Eval:  Bilateral hips 4/5, Left quad/hamstring 4/5, right quad/hamstring 5/5 03/26/2022:  Bilateral  hip strength of 4 to 4+/5, Left quad/hamstrings 4 to 4+/5 05/23/2022:  Bilateral LE strength is grossly 5- to 5/5 throughout  LOWER EXTREMITY SPECIAL TESTS:  Knee special tests: Anterior drawer test: negative, Posterior drawer test: positive , and Patellafemoral grind test: negative  FUNCTIONAL TESTS:  Eval:   5 times sit to stand: 20.3 sec with pushing up from thighs  02/19/2022: 3 Minute Walk Test:  643 ft with pain going up to 7/10 towards end of ambulation  02/27/2022: 5 times sit to stand: 13.1 sec with pushing up from thighs  03/26/2022: 3 Minute Walk Test: 714 ft with pain of 3/10 at end of ambulation 5 times sit to stand: 10.9 sec without UE use, but did use rocking momentum  05/23/2022: 5 times sit to stand:  8.8 sec without UE and good technique 3 Minute Walk Test:  741 ft without increased pain  GAIT: Distance walked: >100 ft Assistive device utilized: None Level of  assistance: Complete Independence Comments: Antalgic gait pattern   TODAY'S TREATMENT:  05/23/2022: Recumbent bike level 2 x7 min with PT present to discuss status FOTO 72% Sit to stand x5 3 minutes ambulation FWD step up on 6" step with alt LE hip extension x12 bilat Side step up on 6" step with alt LE hip abduction x12 bilat FWD and side lunging onto bosu x10 bilat Iontopatch to medial left knee with dexamethasone (1 mL vial) x4 hour patch wear time   05/22/22: Pt arrives for aquatic physical therapy. Treatment took place in 3.5-5.5 feet of water. Water temperature was 91 degrees F.. Pt entered the pool via stairs step to step with mild use of the rails. Pt requires buoyancy of water for support and to offload joints with strengthening exercises as well as utilizing the upthrust to challenge her LE and pelvic muscle strength.75% depth water walking with water bells 10 lengths in each direction. Standing hip flex/ext , abduction, and circles 20x Bil with no UE on pool wall for balance: ankle fins for resistance. Core press with double bouy 20xhen walked across pool. Standing knee extension Bil with small noodle 10x2 Bil. Standing high knee marching holding onto UE weight for more upthrust 6 lenghts . Yellow noodle LE decompression x 3 min at end of session and 5 min underwater bicycle.  05/15/22: Pt arrives for aquatic physical therapy. Treatment took place in 3.5-5.5 feet of water. Water temperature was 91 degrees F.. Pt entered the pool via stairs step to step with mild use of the rails. Pt requires buoyancy of water for support and to offload joints with strengthening exercises as well as utilizing the upthrust to challenge her LE and pelvic muscle strength.75% depth water walking with water bells 10 lengths in each direction. Standing hip flex/ext , abduction, and circles 20x Bil with no UE on pool wall for balance: ankle fins for resistance. Core press with double bouy 20xhen walked across pool  4x, Single leg stance 10 sec no UE static 1x, added UE movements 20x sagittal plane together then reciprocally Bil added water bells for resistance but no upthrust. VC to squeeze gluteals.  Standing knee extension Bil with small noodle 10x2 Bil. Standing high knee marching holding onto UE weight for more upthrust 6 lenghts . Yellow noodle LE decompression x 3 min at end of session and 5 min underwater bicycle.    PATIENT EDUCATION:  Education details: Issued HEP Person educated: Patient Education method: Explanation, Demonstration, and Handouts Education comprehension: verbalized understanding and returned demonstration  HOME EXERCISE PROGRAM: Access Code: [email or web address] URL: [email or web address] Date: 04/17/2022 Prepared by: Shelby Dubin Zahari Xiang  Exercises - Seated Hamstring Stretch  - 1 x daily - 7 x weekly - 1 sets - 2 reps - 20 sec hold - Seated Piriformis Stretch  - 1 x daily - 7 x weekly - 1 sets - 2 reps - 20 sec hold - Seated Long Arc Quad  - 1 x daily - 7 x weekly - 2 sets - 10 reps - Seated March  - 1 x daily - 7 x weekly - 2 sets - 10 reps - Seated Quad Set  - 1 x daily - 7 x weekly - 2 sets - 10 reps - Wall Squat  - 1 x daily - 7 x weekly - 2 sets - 10 reps - Standing Hip Abduction with Counter Support  - 1 x daily - 7 x weekly - 2 sets - 10 reps - Standing Hip Extension with Counter Support  - 1 x daily - 7 x weekly - 2 sets - 10 reps - Standing March with Counter Support  - 1 x daily - 7 x weekly - 2 sets - 10 reps  ASSESSMENT:  CLINICAL IMPRESSION:  Ms Wescott presents to skilled PT reporting that she thinks that she feels that she is ready for discharge today to continue exercising at [facility] and follow up with MD for possible injections.  Patient has met all PT goals at this time and is overall having decreased pain and improved mobility.  Patient has improved on her strength and decreased pain with functional tasks.  Patient has temporary decrease in pain with use of  iontopatch and requests last iontopatch before discharge.  Patient is discharged from skilled PT at this time to continue with HEP.   OBJECTIVE IMPAIRMENTS: decreased balance, difficulty walking, decreased strength, impaired flexibility, and pain.   ACTIVITY LIMITATIONS: lifting, bending, squatting, stairs, and transfers  PARTICIPATION LIMITATIONS: cleaning, community activity, and yard work  PERSONAL FACTORS: Past/current experiences and 3+ comorbidities: CVA, vertigo, OA  are also affecting patient's functional outcome.   REHAB POTENTIAL: Good  CLINICAL DECISION MAKING: Stable/uncomplicated  EVALUATION COMPLEXITY: Moderate   GOALS: Goals reviewed with patient? Yes  SHORT TERM GOALS: Target date: 02/22/2022  Pt will be independent with initial HEP. Baseline: Goal status: MET on 02/27/2022  2.  Pt will decreased time with 5 times sit to/from stand to 15 sec or less to demonstrate improved functional strength. Baseline: 20.3 sec Goal status: MET on 02/27/2022   LONG TERM GOALS: Target date: 05/24/2022   Pt will be independent with advanced HEP. Baseline:  Goal status: MET  2.  Pt will increase FOTO to at least 56% to demonstrate improvements in functional mobility. Baseline: 40% Goal status: MET  3.  Pt will increase bilateral hip and left knee strength to at least 4+ to 5/5 to allow her to navigate stairs in home with increased ease. Baseline:  Goal status: MET  4.  Pt will report ability to walk for greater than 20 minutes without an increase in knee pain to allow pt to return to walking with her family. Baseline:  Goal status: MET   5.  Pt will deny any dizziness during typical functional tasks. Baseline:  Goal status: MET    PLAN:  PT FREQUENCY: 2x/week  PT DURATION: 8 weeks  PLANNED INTERVENTIONS: Therapeutic exercises, Therapeutic activity, Neuromuscular re-education, Balance training, Gait training, Patient/Family education, Self Care, Joint  mobilization, Joint manipulation, Stair training, Vestibular training,  Canalith repositioning, Aquatic Therapy, Dry Needling, Electrical stimulation, Spinal manipulation, Spinal mobilization, Cryotherapy, Moist heat, Taping, Vasopneumatic device, Ultrasound, Ionotophoresis '4mg'$ /ml Dexamethasone, Manual therapy, and Re-evaluation  PLAN FOR NEXT SESSION: Consider another Ionto patch. Re-assess.    PHYSICAL THERAPY DISCHARGE SUMMARY  Patient agrees to discharge. Patient goals were met. Patient is being discharged due to meeting the stated rehab goals.    Juel Burrow, PT, DPT 05/23/22 10:59 AM   [facility] [address] [place], [place] [address] Phone # [phone] Fax [phone]

## 2022-05-27 ENCOUNTER — Ambulatory Visit: Payer: Medicare Other

## 2022-06-01 ENCOUNTER — Encounter (INDEPENDENT_AMBULATORY_CARE_PROVIDER_SITE_OTHER): Payer: Self-pay | Admitting: Internal Medicine

## 2022-06-03 NOTE — Telephone Encounter (Signed)
Please reschedule appt

## 2022-06-03 NOTE — Telephone Encounter (Signed)
Appointment has been cancelled

## 2022-06-04 ENCOUNTER — Ambulatory Visit (INDEPENDENT_AMBULATORY_CARE_PROVIDER_SITE_OTHER): Payer: Medicare Other | Admitting: Internal Medicine

## 2022-06-05 ENCOUNTER — Encounter: Payer: Self-pay | Admitting: Family Medicine

## 2022-06-05 ENCOUNTER — Ambulatory Visit (INDEPENDENT_AMBULATORY_CARE_PROVIDER_SITE_OTHER): Payer: Medicare Other | Admitting: Family Medicine

## 2022-06-05 ENCOUNTER — Ambulatory Visit
Admission: RE | Admit: 2022-06-05 | Discharge: 2022-06-05 | Disposition: A | Discharge status: Home / Self Care | Payer: Medicare Other | Source: Ambulatory Visit | Attending: Family Medicine | Admitting: Family Medicine

## 2022-06-05 VITALS — BP 126/78 | HR 70 | Temp 98.2°F | Wt 182.2 lb

## 2022-06-05 DIAGNOSIS — Z1231 Encounter for screening mammogram for malignant neoplasm of breast: Secondary | ICD-10-CM

## 2022-06-05 DIAGNOSIS — B9689 Other specified bacterial agents as the cause of diseases classified elsewhere: Secondary | ICD-10-CM | POA: Diagnosis not present

## 2022-06-05 DIAGNOSIS — J019 Acute sinusitis, unspecified: Secondary | ICD-10-CM

## 2022-06-05 MED ORDER — AMOXICILLIN-POT CLAVULANATE 875-125 MG PO TABS: 1.0000 | ORAL_TABLET | Freq: Two times a day (BID) | ORAL | 0 refills | Status: DC

## 2022-06-05 NOTE — Progress Notes (Signed)
Kayla Rosales , 1955/11/10, 67 y.o., female MRN: [id number] Patient Care Team    Relationship Specialty Notifications Start End  Ma Hillock, DO PCP - General Family Medicine  01/15/18   Elouise Munroe, MD PCP - Cardiology Cardiology Admissions 07/09/19   Gatha Mayer, MD Consulting Physician Gastroenterology  01/19/18   Princess Bruins, MD Consulting Physician Obstetrics and Gynecology  04/03/20     Chief Complaint  Patient presents with   Facial Pain    1.5 week congestion; vertigo 2 weeks off and on     Subjective: Pt presents for an OV with complaints of sinus pain and vertigo (bilateral) of 10-14 days duration.  Associated symptoms include congestion, cough and fatigue. She is taking zyrtec, flonase and mucinex ( children's).    04/03/2022    8:25 AM 01/21/2022    8:28 AM 03/19/2021    9:31 AM 10/24/2020    8:53 AM 04/03/2020   10:40 AM  Depression screen PHQ 2/9  Decreased Interest 0 3 0 0 0  Down, Depressed, Hopeless 0 1 0 0 0  PHQ - 2 Score 0 4 0 0 0  Altered sleeping  1 1 0 3  Tired, decreased energy  3 1 0 3  Change in appetite  2 0 0 0  Feeling bad or failure about yourself   0 0 0 0  Trouble concentrating  1 0 0 0  Moving slowly or fidgety/restless  0 0 0 0  Suicidal thoughts  0 0 0 0  PHQ-9 Score  11 2 0 6  Difficult doing work/chores  Somewhat difficult  Not difficult at all     Allergies  Allergen Reactions   Hydromorphone Other (See Comments)    HYPOTENSION   Meperidine Other (See Comments)    "BP drops"   Sulfa Antibiotics Other (See Comments)    States she does not know the reaction    Social History   Social History Narrative   Married second family - 8 kids 2 grandkids blended   Haematologist, retired Pharmacist, hospital   Caffeine 2/day   Smoker - never   EtOH - rare   No drugs   Wears her seatbelt, smoke alarm at home.   Feels safe in her relationships.   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 Still unclear what is cuasing her pain. May be nerve related. May benefit from starting on gabapentin. Start gabapentin 300 mg TID. F/u 4 weeks   Night sweats    Palpitations    Recurrent upper respiratory infection (URI)    Right arm weakness 09/28/2015   Last Assessment & Plan:  Formatting of this note might be different from the original. Referral to PT today. 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    Vertigo 08/11/2015   Last Assessment & Plan:  Formatting of this note might be different from the original. Concern given gradual onset over the last week and a half in addition to neck pain. Patient needs neuroimaging to rule out dissecting vertebral aneurysm. Patient advised to go to emergency room. She declined ambulance transport   Vitamin B 12 deficiency    Vitamin D deficiency    Past Surgical History:  Procedure Laterality Date   CARPAL TUNNEL RELEASE     COLONOSCOPY  2015   [name]- [place], [place]; 5 yr repeat recommended   COLONOSCOPY W/ POLYPECTOMY  05/26/2018   x5 polyps   KNEE ARTHROSCOPY Bilateral 2013   meniscus  x2   NECK SURGERY  2018   disc fusion   UPPER GASTROINTESTINAL ENDOSCOPY     Family History  Problem Relation Age of Onset    Depression Mother    Heart disease Mother    Early death Mother 77   Hypertension Mother    Thyroid disease Mother    Anxiety disorder Mother    Obesity Mother    Arthritis Father    Heart disease Father    Hypertension Father    Stroke Father    Depression Sister 53   Early death Sister    Drug abuse Sister    Lung cancer Sister    Mental illness Sister    COPD Sister    Diabetes Maternal Grandmother    Early death Maternal Grandmother    Early death Maternal Grandfather    Heart disease Maternal Grandfather    Breast cancer Cousin    Colon cancer Neg Hx    Esophageal cancer Neg Hx    Stomach cancer Neg Hx    Pancreatic cancer Neg Hx    Rectal cancer Neg Hx    Allergies as of 06/05/2022       Reactions   Hydromorphone Other (See Comments)   HYPOTENSION   Meperidine Other (See Comments)   "BP drops"   Sulfa Antibiotics Other (See Comments)   States she does not know the reaction        Medication List        Accurate as of June 05, 2022 11:49 AM. If you have any questions, ask your nurse or doctor.          albuterol 108 (90 Base) MCG/ACT inhaler Commonly known as: VENTOLIN HFA TAKE 2 PUFFS BY MOUTH EVERY 6 HOURS AS NEEDED FOR WHEEZE OR SHORTNESS OF BREATH   ALIVE WOMENS 50+ PO Take by mouth daily.   amoxicillin-clavulanate 875-125 MG tablet Commonly known as: AUGMENTIN Take 1 tablet by mouth 2 (two) times daily. Started by: Howard Pouch, DO   aspirin EC 81 MG tablet   atorvastatin 20 MG tablet Commonly known as: LIPITOR Take 1 tablet (20 mg total) by mouth daily.   Benefiber Powd Take 5 mg by mouth daily.   cetirizine 5 MG tablet Commonly known as: ZYRTEC Take 5 mg by mouth daily.   Cholecalciferol 25 MCG (1000 UT) capsule Take 1,000 Units by mouth daily.   cyanocobalamin 1000 MCG tablet Take 500 mcg by mouth daily.   esomeprazole 40 MG capsule Commonly known as: NEXIUM Take 1 capsule (  40 mg total) by mouth daily at 12 noon.    fluticasone 50 MCG/ACT nasal spray Commonly known as: FLONASE Place into both nostrils daily.   Magnesium 250 MG Tabs Take 1 tablet by mouth daily.   polyethylene glycol powder 17 GM/SCOOP powder Commonly known as: GLYCOLAX/MIRALAX Take by mouth.   Probiotic Advanced Caps daily.        All past medical history, surgical history, allergies, family history, immunizations andmedications were updated in the EMR today and reviewed under the history and medication portions of their EMR.     Review of Systems  Constitutional:  Positive for chills and malaise/fatigue. Negative for fever.  HENT:  Positive for congestion and sinus pain. Negative for ear discharge and sore throat.   Eyes: Negative.   Respiratory:  Positive for cough. Negative for sputum production, shortness of breath and wheezing.   Cardiovascular: Negative.   Gastrointestinal: Negative.   Musculoskeletal: Negative.   Skin:  Negative for rash.  Neurological:  Positive for dizziness. Negative for headaches.   Negative, with the exception of above mentioned in HPI  Objective:  BP 126/78   Pulse 70   Temp 98.2 F (36.8 C)   Wt 182 lb 3.2 oz (82.6 kg)   SpO2 96%   BMI 31.27 kg/m  Body mass index is 31.27 kg/m. Physical Exam Vitals and nursing note reviewed.  Constitutional:      General: She is not in acute distress.    Appearance: Normal appearance. She is normal weight. She is not ill-appearing or toxic-appearing.  HENT:     Head: Normocephalic and atraumatic.     Right Ear: Tympanic membrane, ear canal and external ear normal. There is no impacted cerumen.     Left Ear: Tympanic membrane and external ear normal. There is no impacted cerumen.     Nose: Congestion present. No rhinorrhea.     Mouth/Throat:     Pharynx: No oropharyngeal exudate or posterior oropharyngeal erythema.  Eyes:     General: No scleral icterus.       Right eye: No discharge.        Left eye: No discharge.     Extraocular  Movements: Extraocular movements intact.     Conjunctiva/sclera: Conjunctivae normal.     Pupils: Pupils are equal, round, and reactive to light.  Cardiovascular:     Rate and Rhythm: Normal rate and regular rhythm.  Pulmonary:     Effort: Pulmonary effort is normal. No respiratory distress.     Breath sounds: Normal breath sounds. No wheezing, rhonchi or rales.  Musculoskeletal:     Cervical back: Neck supple.  Lymphadenopathy:     Cervical: No cervical adenopathy.  Skin:    Findings: No rash.  Neurological:     Mental Status: She is alert and oriented to person, place, and time. Mental status is at baseline.     Motor: No weakness.     Coordination: Coordination normal.     Gait: Gait normal.  Psychiatric:        Mood and Affect: Mood normal.        Behavior: Behavior normal.        Thought Content: Thought content normal.        Judgment: Judgment normal.     No results found. No results found. No results found for this or any previous visit (from the past 24 hour(s)).  Assessment/Plan: RAYLEEN SERRATOS is a 67 y.o. female present for OV for  Acute bacterial sinusitis  Rest, hydrate.  +/- flonase, mucinex (DM if cough), nettie pot or nasal saline.  Augmentin BID prescribed, take until completed.  If cough present it can last up to 6-8 weeks.  F/U 2 weeks of not improved.   Reviewed expectations re: course of current medical issues. Discussed self-management of symptoms. Outlined signs and symptoms indicating need for more acute intervention. Patient verbalized understanding and all questions were answered. Patient received an After-Visit Summary.    No orders of the defined types were placed in this encounter.  Meds ordered this encounter  Medications   amoxicillin-clavulanate (AUGMENTIN) 875-125 MG tablet    Sig: Take 1 tablet by mouth 2 (two) times daily.    Dispense:  20 tablet    Refill:  0   Referral Orders  No referral(s) requested today     Note is  dictated utilizing voice recognition software. Although note has been proof read prior to signing, occasional typographical errors still can be missed. If any questions arise, please do not hesitate to call for verification.   electronically signed by:  Howard Pouch, DO  [place]

## 2022-06-13 NOTE — Progress Notes (Signed)
Cardiology Office Note:    Date:  06/14/2022   ID:  Kayla Rosales, DOB [birth year], MRN [id number]  PCP:  Natalia Leatherwood, DO  Cardiologist:  Parke Poisson, MD  Electrophysiologist:  None   Referring MD: Natalia Leatherwood, DO   Chief Complaint/Reason for Referral: Follow up; palpitations, murmur  History of Present Illness:    Kayla Rosales is a 67 y.o. female with a history of stroke in 2017 while on HRT due to early menopause, with no residual deficits, mild OSA with possible plan for septorhinoplasty, improved with weight loss, murmur with no valve disease on echo, and palpitations with infrequent SVT and ectopy, not requiring treatment. She presents for follow up.   Notices some leg discomfort, discussed screening for vascular disease. Feels unwell at times and notes that when she traveled to her daughter's house this symptoms resolved. Concerned it may be related to her well water supply. We discussed elimination of well water to trial if this is exacerbating symptoms. If not discussed statin holiday for discomfort.   The patient denies chest pain, chest pressure, dyspnea at rest, palpitations, PND, orthopnea. Denies cough, fever, chills. Denies nausea, vomiting. Denies syncope or presyncope. Denies dizziness or lightheadedness.   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 Still unclear what is cuasing her pain. May be nerve related. May benefit from starting on gabapentin. Start gabapentin 300 mg TID. F/u 4 weeks   Night sweats    Palpitations    Recurrent upper respiratory infection (URI)    Right arm weakness 09/28/2015   Last Assessment & Plan:  Formatting of this note might be different from the original. Referral to PT today. 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    Vertigo 08/11/2015   Last Assessment & Plan:  Formatting of this note might be different from the original. Concern given gradual onset over the last week and a half in addition to neck pain. Patient needs neuroimaging to rule out dissecting vertebral aneurysm. Patient advised to go to emergency room. She declined ambulance transport   Vitamin B 12 deficiency    Vitamin D deficiency     Past Surgical History:  Procedure Laterality Date   CARPAL TUNNEL RELEASE     COLONOSCOPY  2015   [facility] [place]; 5 yr repeat recommended   COLONOSCOPY W/ POLYPECTOMY  05/26/2018   x5 polyps   KNEE ARTHROSCOPY Bilateral 2013   meniscus  x2   NECK SURGERY  2018   disc fusion   UPPER GASTROINTESTINAL ENDOSCOPY  Current Medications: Current Meds  Medication Sig   albuterol (VENTOLIN HFA) 108 (90 Base) MCG/ACT inhaler TAKE 2 PUFFS BY MOUTH EVERY 6 HOURS AS NEEDED FOR WHEEZE OR SHORTNESS OF BREATH   amoxicillin-clavulanate (AUGMENTIN) 875-125 MG tablet Take 1 tablet by mouth 2 (two) times daily.   aspirin EC 81 MG tablet    atorvastatin (LIPITOR) 20 MG tablet Take 1 tablet (20 mg total) by mouth daily.   cetirizine (ZYRTEC) 5 MG tablet Take 5 mg by mouth daily.   Cholecalciferol 25 MCG (1000 UT) capsule Take 1,000 Units by mouth daily.   cyanocobalamin 1000 MCG tablet Take 500 mcg by mouth daily.   esomeprazole (NEXIUM) 40 MG capsule Take  1 capsule (40 mg total) by mouth daily at 12 noon.   fluticasone (FLONASE) 50 MCG/ACT nasal spray Place into both nostrils daily.   Magnesium 250 MG TABS Take 1 tablet by mouth daily.   Multiple Vitamins-Minerals (ALIVE WOMENS 50+ PO) Take by mouth daily.   polyethylene glycol powder (GLYCOLAX/MIRALAX) 17 GM/SCOOP powder Take by mouth.   Probiotic Product (PROBIOTIC ADVANCED) CAPS daily.    Wheat Dextrin (BENEFIBER) POWD Take 5 mg by mouth daily.     Allergies:   Hydromorphone, Meperidine, Other, and Sulfa antibiotics   Social History   Tobacco Use   Smoking status: Never    Passive exposure: Never   Smokeless tobacco: Never  Vaping Use   Vaping Use: Never used  Substance Use Topics   Alcohol use: Yes    Alcohol/week: 1.0 standard drink of alcohol    Types: 1 Glasses of wine per week   Drug use: Never     Family History: The patient's family history includes Anxiety disorder in her mother; Arthritis in her father; Breast cancer in her cousin; COPD in her sister; Depression in her mother; Depression (age of onset: 65) in her sister; Diabetes in her maternal grandmother; Drug abuse in her sister; Early death in her maternal grandfather, maternal grandmother, and sister; Early death (age of onset: 28) in her mother; Heart disease in her father, maternal grandfather, and mother; Hypertension in her father and mother; Lung cancer in her sister; Mental illness in her sister; Obesity in her mother; Stroke in her father; Thyroid disease in her mother. There is no history of Colon cancer, Esophageal cancer, Stomach cancer, Pancreatic cancer, or Rectal cancer.  ROS:   Please see the history of present illness.    (+) Cough (+) Insomnia (+) Fatigue/Malaise (+) Weight gain All other systems reviewed and are negative.  EKGs/Labs/Other Studies Reviewed:    The following studies were reviewed today:  CT Abdomen/Pelvis 01/04/2021: FINDINGS: Lower chest: No acute abnormality.    Hepatobiliary: No suspicious hepatic lesion. Calcified hepatic granulomata. Gallbladder is unremarkable. No biliary ductal dilation.   Pancreas: Unremarkable. No pancreatic ductal dilatation or surrounding inflammatory changes.   Spleen: Calcified splenic granulomata.   Adrenals/Urinary Tract: Adrenal glands are unremarkable. Kidneys are normal, without renal calculi, solid enhancing lesion, or hydronephrosis. Bladder is unremarkable for degree of distension.   Stomach/Bowel: Radiopaque enteric contrast traverses the splenic flexure. Stomach is decompressed limiting evaluation. No pathologic dilation of small or large bowel. The appendix and terminal ileum appear normal. Colonic diverticulosis without findings of acute diverticulitis. Small volume of formed stool throughout the colon.   Vascular/Lymphatic: Aortic and branch vessel atherosclerosis without abdominal aortic aneurysm. No pathologically enlarged abdominal or pelvic lymph nodes.   Reproductive: Uterus and bilateral adnexa are unremarkable.   Other: No significant abdominopelvic  ascites. Small fat containing paraumbilical hernia.   Musculoskeletal: Multilevel degenerative changes spine. Chronic changes of osteitis pubis. No acute osseous abnormality.   IMPRESSION: 1. No acute abdominopelvic findings. 2. Colonic diverticulosis without findings of acute diverticulitis. 3. Small volume of formed stool in the colon. 4.  Aortic Atherosclerosis (ICD10-I70.0).  Monitor 08/2019: Indication: palpitations   Minimum HR (bpm): 48 Maximum HR (bpm): 210   Supraventricular Ectopy: rare <1% SVT: 13 episodes of SVT, longest was 13 beats at 117 bpm, fastest was 10 beats at 210 bpm.   Ventricular Ectopy: rare <1% NSVT: none Ventricular Tachycardia: none   Pauses: none AV block: none   Atrial fibrillation: none   Diary events: Monitor trigger with sinus rhythm and supraventricular ectopy.    IMPRESSION: Brief episodes  of SVT without associated diary events. Diary events correlate with sinus rhythm and supraventricular ectopy.   Echo 07/30/2019:  1. Left ventricular ejection fraction, by estimation, is 60 to 65%. The  left ventricle has normal function. The left ventricle has no regional  wall motion abnormalities. Left ventricular diastolic parameters are  consistent with Grade I diastolic  dysfunction (impaired relaxation).   2. Right ventricular systolic function is normal. The right ventricular  size is normal. There is normal pulmonary artery systolic pressure. The  estimated right ventricular systolic pressure is 18.8 mmHg.   3. Right atrial size was mildly dilated.   4. The mitral valve is normal in structure. Trivial mitral valve  regurgitation. No evidence of mitral stenosis.   5. The aortic valve is tricuspid. Aortic valve regurgitation is trivial.  No aortic stenosis is present.   6. The inferior vena cava is normal in size with greater than 50%  respiratory variability, suggesting right atrial pressure of 3 mmHg.   EKG:  EKG is personally reviewed. 06/14/22: NSR, poor R wave prog 08/27/2021:  Sinus rhythm. Poor R wave progression. 02/23/2020: NSR  Recent Labs: 03/29/2022: ALT 18; BUN 16; Creat 0.61; Hemoglobin 15.4; Magnesium 2.1; Platelets 232; Potassium 4.2; Sodium 136; TSH 1.48   Recent Lipid Panel    Component Value Date/Time   CHOL 122 03/29/2022 1505   CHOL 132 01/21/2022 1112   TRIG 209 (H) 03/29/2022 1505   HDL 38 (L) 03/29/2022 1505   HDL 45 01/21/2022 1112   CHOLHDL 3.2 03/29/2022 1505   VLDL 18.0 03/19/2021 0952   LDLCALC 57 03/29/2022 1505    Physical Exam:    VS:  BP 138/80   Pulse 67   Ht 5\' 5"  (1.651 m)   Wt 185 lb 12.8 oz (84.3 kg)   BMI 30.92 kg/m     Wt Readings from Last 5 Encounters:  06/14/22 185 lb 12.8 oz (84.3 kg)  06/05/22 182 lb 3.2 oz (82.6 kg)  04/23/22 177 lb (80.3 kg)  04/03/22 181 lb (82.1 kg)  03/29/22 181 lb (82.1 kg)     Constitutional: No acute distress Eyes: sclera non-icteric, normal conjunctiva and lids ENMT: normal dentition, moist mucous membranes Cardiovascular: regular rhythm, normal rate, no murmurs. S1 and S2 normal. No jugular venous distention.  Respiratory: clear to auscultation bilaterally GI : normal bowel sounds, soft and nontender. No distention.   MSK: extremities warm, well perfused. No edema.  NEURO: grossly nonfocal exam, moves all extremities. PSYCH: alert and oriented x 3, normal mood and affect.   ASSESSMENT:    1. Pain in both lower extremities   2. PFO (patent foramen ovale)   3. Interscapular pain   4. Precordial pain  5. Abnormal electrocardiogram      PLAN:    LE pain - will get lower extremity vascular studies to rule out PAD.  - will attempt statin holiday and hold well water due to concern of contribution to symptoms.   Interscapular back pain Abnormal EKG -coronary cta with mild-moderate nonobstructive CAD, no significant recurrence of discomfort.  - she may take statin holiday for leg pain.   Palpitations - Plan: EKG 12-Lead -No significant recurrence  History of stroke while on hormone replacement therapy - no residual deficit. enteric-coated aspirin and statin as above.  - does have a small PFO. Reviewed with interventional cardiology, will observe for now. Since this is quite small, we can likely watch this over time and be quite careful to avoid blood clots in the legs. I have sent her some patient education materials to review how to prevent blood clots and explaining PFO in more detail previously.  Snoring - mild OSA, no recommendation for appliance at that time. Improved with weight loss.   Total time of encounter: 30 minutes total time of encounter, including 20 minutes spent in face-to-face patient care on the date of this encounter. This time includes coordination of care and counseling regarding above mentioned problem list. Remainder of  non-face-to-face time involved reviewing chart documents/testing relevant to the patient encounter and documentation in the medical record. I have independently reviewed documentation from referring provider.   Weston Brass, MD, [facility]  [facility]    Medication Adjustments/Labs and Tests Ordered: Current medicines are reviewed at length with the patient today.  Concerns regarding medicines are outlined above.   Orders Placed This Encounter  Procedures   EKG 12-Lead   VAS [place] ABI WITH/WO TBI   VAS [place] LOWER EXTREMITY ARTERIAL DUPLEX    No orders of the defined types were placed in this encounter.   Patient Instructions  Medication Instructions:   IF AFTER LIFESTYLE CHANGES NO IMPROVEMENT PLEASE HOLD STATIN FOR 2 WEEKS AND LET us KNOW HOW YOU FEEL   *If you need a refill on your cardiac medications before your next appointment, please call your pharmacy*  Lab Work: None Ordered At This Time.  If you have labs (blood work) drawn today and your tests are completely normal, you will receive your results only by: MyChart Message (if you have MyChart) OR A paper copy in the mail If you have any lab test that is abnormal or we need to change your treatment, we will call you to review the results.  Testing/Procedures: Your physician has requested that you have a lower extremity arterial duplex. During this test, ultrasound is used to evaluate arterial blood flow in the legs. Allow one hour for this exam. There are no restrictions or special instructions. This will take place at [address], [address]  Your physician has requested that you have an ankle brachial index (ABI). During this test an ultrasound and blood pressure cuff are used to evaluate the arteries that supply the arms and legs with blood. Allow thirty minutes for this exam. There are no restrictions or special instructions. This will take place at [address], [address].   Follow-Up: At [facility], you and your health needs are our priority.  As part of our continuing mission to provide you with exceptional heart care, we have created designated Provider Care Teams.  These Care Teams include your primary Cardiologist (physician) and Advanced Practice Providers (APPs -  Physician Assistants and Nurse  Practitioners) who all work together to provide you with the care you need, when you need it.  Your next appointment:   6 month(s)  Provider:   Parke Poisson, MD

## 2022-06-14 ENCOUNTER — Encounter: Payer: Self-pay | Admitting: Internal Medicine

## 2022-06-14 ENCOUNTER — Ambulatory Visit: Payer: Medicare Other | Attending: Internal Medicine | Admitting: Internal Medicine

## 2022-06-14 VITALS — BP 138/80 | HR 67 | Ht 65.0 in | Wt 185.8 lb

## 2022-06-14 DIAGNOSIS — R9431 Abnormal electrocardiogram [ECG] [EKG]: Secondary | ICD-10-CM

## 2022-06-14 DIAGNOSIS — R072 Precordial pain: Secondary | ICD-10-CM | POA: Diagnosis present

## 2022-06-14 DIAGNOSIS — M5489 Other dorsalgia: Secondary | ICD-10-CM | POA: Diagnosis present

## 2022-06-14 DIAGNOSIS — M79604 Pain in right leg: Secondary | ICD-10-CM | POA: Diagnosis present

## 2022-06-14 DIAGNOSIS — Q2112 Patent foramen ovale: Secondary | ICD-10-CM | POA: Diagnosis present

## 2022-06-14 DIAGNOSIS — M79605 Pain in left leg: Secondary | ICD-10-CM | POA: Diagnosis present

## 2022-06-14 NOTE — Patient Instructions (Signed)
Medication Instructions:   IF AFTER LIFESTYLE CHANGES NO IMPROVEMENT PLEASE HOLD STATIN FOR 2 WEEKS AND LET us KNOW [place]   *If you need a refill on your cardiac medications before your next appointment, please call your pharmacy*  Lab Work: None Ordered At This Time.  If you have labs (blood work) drawn today and your tests are completely normal, you will receive your results only by: [place] (if you have MyChart) OR A paper copy in the mail If you have any lab test that is abnormal or we need to change your treatment, we will call you to review the results.  Testing/Procedures: Your physician has requested that you have a lower extremity arterial duplex. During this test, ultrasound is used to evaluate arterial blood flow in the legs. Allow one hour for this exam. There are no restrictions or special instructions. This will take place at [place], [address].  Your physician has requested that you have an ankle brachial index (ABI). During this test an ultrasound and blood pressure cuff are used to evaluate the arteries that supply the arms and legs with blood. Allow thirty minutes for this exam. There are no restrictions or special instructions. This will take place at [place], [address].   Follow-Up: At [facility], you and your health needs are our priority.  As part of our continuing mission to provide you with exceptional heart care, we have created designated Provider Care Teams.  These Care Teams include your primary Cardiologist (physician) and Advanced Practice Providers (APPs -  Physician Assistants and Nurse Practitioners) who all work together to provide you with the care you need, when you need it.  Your next appointment:   6 month(s)  Provider:   Elouise Munroe, MD

## 2022-06-19 ENCOUNTER — Encounter: Payer: Self-pay | Admitting: Orthopaedic Surgery

## 2022-06-20 ENCOUNTER — Telehealth: Payer: Self-pay

## 2022-06-20 DIAGNOSIS — M1711 Unilateral primary osteoarthritis, right knee: Secondary | ICD-10-CM

## 2022-06-20 DIAGNOSIS — M1712 Unilateral primary osteoarthritis, left knee: Secondary | ICD-10-CM

## 2022-06-20 NOTE — Telephone Encounter (Signed)
Patient called back to schedule for gel injection.

## 2022-07-03 ENCOUNTER — Ambulatory Visit (HOSPITAL_COMMUNITY)
Admission: RE | Admit: 2022-07-03 | Discharge: 2022-07-03 | Disposition: A | Discharge status: Home / Self Care | Payer: Medicare Other | Source: Ambulatory Visit | Attending: Internal Medicine | Admitting: Internal Medicine

## 2022-07-03 DIAGNOSIS — M79605 Pain in left leg: Secondary | ICD-10-CM | POA: Diagnosis not present

## 2022-07-03 DIAGNOSIS — M79604 Pain in right leg: Secondary | ICD-10-CM | POA: Diagnosis not present

## 2022-07-03 LAB — VAS US ABI WITH/WO TBI
Left ABI: 1.1
Right ABI: 1.18

## 2022-07-15 DIAGNOSIS — M170 Bilateral primary osteoarthritis of knee: Secondary | ICD-10-CM | POA: Diagnosis not present

## 2022-07-15 DIAGNOSIS — M1711 Unilateral primary osteoarthritis, right knee: Secondary | ICD-10-CM

## 2022-07-15 NOTE — Progress Notes (Unsigned)
Office Visit Note   Patient: Kayla Rosales           Date of Birth: Jun 14, 1955           MRN: [id number] Visit Date: 07/16/2022              Requested by: Ma Hillock, DO [address] [place],  [place] [id number] PCP: Ma Hillock, DO   Assessment & Plan: Visit Diagnoses:  1. Primary osteoarthritis of left knee   2. Primary osteoarthritis of right knee     Plan: Both knees injected with Monovisc today.  She tolerated these well.  Follow-up as needed.  Follow-Up Instructions: No follow-ups on file.   Orders:  Orders Placed This Encounter  Procedures   Large Joint Inj: bilateral knee   No orders of the defined types were placed in this encounter.     Procedures: Large Joint Inj: bilateral knee on 07/15/2022 8:48 PM Indications: pain Details: 22 G needle  Arthrogram: No  Medications (Right): 2 mL lidocaine 1 %; 2 mL bupivacaine 0.5 %; 88 mg Hyaluronan 88 MG/4ML Medications (Left): 2 mL lidocaine 1 %; 2 mL bupivacaine 0.5 %; 88 mg Hyaluronan 88 MG/4ML Outcome: tolerated well, no immediate complications Patient was prepped and draped in the usual sterile fashion.      Clinical Data: No additional findings.   Subjective: Chief Complaint  Patient presents with   Right Knee - Pain   Left Knee - Pain    HPI  Kayla Rosales comes in today for bilateral knee Monovisc injections.    Review of Systems   Objective: Vital Signs: There were no vitals taken for this visit.  Physical Exam  Ortho Exam  Knee exams are unchanged.  Specialty Comments:  No specialty comments available.  Imaging: No results found.   PMFS History: Patient Active Problem List   Diagnosis Date Noted   Vitamin D deficiency 01/21/2022   Other tear of medial meniscus, current injury, left knee, subsequent encounter 01/14/2022   Other adverse food reactions, not elsewhere classified, subsequent encounter [id number]   Diastolic dysfunction [id number]   Heart murmur 02/16/2019    Hyperlipidemia LDL goal <100 02/16/2019   Class 1 obesity with serious comorbidity and body mass index (BMI) of 30.0 to 30.9 in adult 01/04/2019   Pelvic floor dysfunction in female 12/06/2018   Constipation due to outlet dysfunction 12/06/2018   Lactose intolerance 12/06/2018   Diverticulosis 05/27/2018   Osteopenia 01/19/2018   Irritable bowel syndrome with constipation 01/17/2018   History of colonic polyp 01/17/2018   History of stroke 01/15/2018   GERD (gastroesophageal reflux disease)    Asthma    S/P cervical spinal fusion 09/29/2017   Macrocytosis without anemia 11/25/2015   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   Heart palpitations 09/08/2015   Vitamin B12 deficiency 04/06/2014   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   Vestibular neuritis 10/15/2006   Family history of malignant neoplasm of breast 03/18/2006   Diaphragmatic hernia 03/18/2006   Premature menopause 03/18/2006   Past Medical History:  Diagnosis Date   Allergy    Arthritis    Asthma    Back pain    Benign positional vertigo    Bilateral swelling of feet    Bronchitis    Chicken pox    Colon polyps    Constipation    Cough 10/09/2020   COVID-19 10/09/2020   Diverticulitis    Early menopause occurring in patient age younger than 68 years  Fibromyalgia    GERD (gastroesophageal reflux disease)    Gluten intolerance    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 Still unclear what is cuasing her pain. May be nerve related. May benefit from starting on gabapentin. Start gabapentin 300 mg TID. F/u 4 weeks   Night sweats    Palpitations    Recurrent upper respiratory infection (URI)    Right arm weakness 09/28/2015   Last Assessment & Plan:  Formatting of this note might  be different from the original. Referral to PT today. Suspect residual after stroke in left basal ganglia   Sleep apnea    Snoring 01/04/2019   Stroke (cerebrum) ([place]) 2017   right UE/neck sx, resolved.    Vertigo 08/11/2015   Last Assessment & Plan:  Formatting of this note might be different from the original. Concern given gradual onset over the last week and a half in addition to neck pain. Patient needs neuroimaging to rule out dissecting vertebral aneurysm. Patient advised to go to emergency room. She declined ambulance transport   Vitamin B 12 deficiency    Vitamin D deficiency     Family History  Problem Relation Age of Onset   Depression Mother    Heart disease Mother    Early death Mother 79   Hypertension Mother    Thyroid disease Mother    Anxiety disorder Mother    Obesity Mother    Arthritis Father    Heart disease Father    Hypertension Father    Stroke Father    Depression Sister 15   Early death Sister    Drug abuse Sister    Lung cancer Sister    Mental illness Sister    COPD Sister    Diabetes Maternal Grandmother    Early death Maternal Grandmother    Early death Maternal Grandfather    Heart disease Maternal Grandfather    Breast cancer Cousin    Colon cancer Neg Hx    Esophageal cancer Neg Hx    Stomach cancer Neg Hx    Pancreatic cancer Neg Hx    Rectal cancer Neg Hx     Past Surgical History:  Procedure Laterality Date   CARPAL TUNNEL RELEASE     COLONOSCOPY  2015   [name]- [place], [place]; 5 yr repeat recommended   COLONOSCOPY W/ POLYPECTOMY  05/26/2018   x5 polyps   KNEE ARTHROSCOPY Bilateral 2013   meniscus  x2   NECK SURGERY  2018   disc fusion   UPPER GASTROINTESTINAL ENDOSCOPY     Social History   Occupational History   Occupation: retired Pharmacist, hospital  Tobacco Use   Smoking status: Never    Passive exposure: Never   Smokeless tobacco: Never  Vaping Use   Vaping Use: Never used  Substance and Sexual Activity   Alcohol use:  Yes    Alcohol/week: 1.0 standard drink of alcohol    Types: 1 Glasses of wine per week   Drug use: Never   Sexual activity: Yes    Partners: Male    Birth control/protection: Post-menopausal    Comment: married,intercourse age 67, less than 5 sexual partners,des neg

## 2022-07-16 ENCOUNTER — Ambulatory Visit (INDEPENDENT_AMBULATORY_CARE_PROVIDER_SITE_OTHER): Payer: Medicare Other | Admitting: Orthopaedic Surgery

## 2022-07-16 DIAGNOSIS — M1711 Unilateral primary osteoarthritis, right knee: Secondary | ICD-10-CM

## 2022-07-16 DIAGNOSIS — M1712 Unilateral primary osteoarthritis, left knee: Secondary | ICD-10-CM | POA: Diagnosis not present

## 2022-07-16 MED ORDER — BUPIVACAINE HCL 0.5 % IJ SOLN
2.0000 mL | INTRAMUSCULAR | Status: AC | PRN
  Administered 2022-07-15: 2 mL via INTRA_ARTICULAR

## 2022-07-16 MED ORDER — LIDOCAINE HCL 1 % IJ SOLN
2.0000 mL | INTRAMUSCULAR | Status: AC | PRN
Start: 2022-07-15 — End: 2022-07-15
  Administered 2022-07-15: 2 mL

## 2022-07-16 MED ORDER — HYALURONAN 88 MG/4ML IX SOSY
88.0000 mg | PREFILLED_SYRINGE | INTRA_ARTICULAR | Status: AC | PRN
Start: 2022-07-15 — End: 2022-07-15
  Administered 2022-07-15: 88 mg via INTRA_ARTICULAR

## 2022-07-23 ENCOUNTER — Ambulatory Visit: Payer: Medicare Other | Admitting: Family Medicine

## 2022-07-24 ENCOUNTER — Ambulatory Visit: Payer: Medicare Other | Admitting: Physical Therapy

## 2022-08-02 ENCOUNTER — Encounter: Payer: Self-pay | Admitting: Internal Medicine

## 2022-08-27 NOTE — Telephone Encounter (Signed)
Returned call to patient- states that well water did fix leg pain and remains on the statin. Advised her of Dr. Lupe Carney message above as well. Advised patient to call back to office with any issues, questions, or concerns. Patient verbalized understanding.

## 2022-09-13 ENCOUNTER — Ambulatory Visit: Payer: Medicare Other | Admitting: Family Medicine

## 2022-10-16 ENCOUNTER — Ambulatory Visit (INDEPENDENT_AMBULATORY_CARE_PROVIDER_SITE_OTHER): Payer: Medicare Other | Admitting: Physician Assistant

## 2022-10-16 ENCOUNTER — Encounter: Payer: Self-pay | Admitting: Physician Assistant

## 2022-10-16 VITALS — BP 126/78 | HR 73 | Ht 65.0 in | Wt 183.1 lb

## 2022-10-16 DIAGNOSIS — K5900 Constipation, unspecified: Secondary | ICD-10-CM

## 2022-10-16 DIAGNOSIS — K6289 Other specified diseases of anus and rectum: Secondary | ICD-10-CM

## 2022-10-16 DIAGNOSIS — K648 Other hemorrhoids: Secondary | ICD-10-CM

## 2022-10-16 MED ORDER — HYDROCORTISONE ACETATE 25 MG RE SUPP: 25.0000 mg | Freq: Two times a day (BID) | RECTAL | 1 refills | Status: AC

## 2022-10-16 NOTE — Progress Notes (Signed)
Chief Complaint: Hemorrhoids, fecal leakage, constipation  HPI:    Kayla Rosales is a  67 y/o female, known to Dr. Leone Payor, who was referred to me by Felix Pacini A, DO for a complaint of hemorrhoids, fecal leakage and constipation.      12/27/2020 office visit with Alcide Evener, NP at that time with rectal pain, no abnormality seen on rectal exam.  Recommended Desitin.    02/01/2021 colonoscopy and EGD.  Colonoscopy at three 4-7 mm polyps in the transverse colon and cecum, diverticulosis in the sigmoid colon and descending colon and redundant colon.  Pathology showed sessile serrated polyps maximum 7 mm.  Recall placed for 2025.  EGD normal.    09/16/2022 patient contacted our clinic and described issues with hemorrhoids.  At that time discussed Desitin in the anal opening into the external area 3 times a day.  Also Preparation H suppositories.    Today, the patient presents to clinic and tells me that she did try the Desitin as recommended but she found it very uncomfortable so she stopped using it.  Describes it typically what happens for her is that after her colonoscopy she does good for about a year but then everything seems to go "wacky" in her colon.  Tells me she tends towards constipation and/or feeling like she does not have a complete bowel movement and if she is eating anything dairy will use MiraLAX ahead of time.  Blames this on her "tortuous colon".  Tells me that most recently she has felt like there is more pressure in her rectum and for a couple of days she "used" stool almost like she had no control.  She feels like her muscles are more relaxed back there than normal, it has gotten slightly better over the past week or so but still feels like something is different back the year, wondering if she has hemorrhoid issues or not.  Tells me that sometimes it hurts when she is sitting on a hard surface for a long time or driving for a while.  Does take 1 dose of Benefiber daily.     Denies fever, chills, weight loss, blood in her stool or symptoms that awaken her from sleep.  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 Still unclear what is cuasing her pain. May be nerve related. May benefit from starting on gabapentin. Start gabapentin 300 mg TID. F/u 4 weeks   Night sweats    Palpitations    Recurrent upper respiratory infection (URI)    Right arm weakness 09/28/2015   Last Assessment & Plan:  Formatting of this note might be different from the original. Referral to PT today. 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    Vertigo 08/11/2015   Last Assessment & Plan:  Formatting of this note might be different from the original. Concern given gradual onset over the last week and a half in addition to neck pain. Patient needs neuroimaging to rule out dissecting vertebral aneurysm. Patient advised to go to emergency room. She declined ambulance transport   Vitamin B 12 deficiency    Vitamin D deficiency     Past Surgical History:  Procedure Laterality Date   CARPAL TUNNEL RELEASE     [facility] [place]   COLONOSCOPY W/ POLYPECTOMY  05/26/2018   x5 polyps   KNEE  ARTHROSCOPY Bilateral 2013   meniscus  x2   NECK SURGERY  2018   disc fusion   UPPER GASTROINTESTINAL ENDOSCOPY      Current Outpatient Medications  Medication Sig Dispense Refill   albuterol (VENTOLIN HFA) 108 (90 Base) MCG/ACT inhaler TAKE 2 PUFFS BY MOUTH EVERY 6 HOURS AS NEEDED FOR WHEEZE OR SHORTNESS OF BREATH 8.5 each 0   amoxicillin-clavulanate (AUGMENTIN) 875-125 MG tablet Take 1 tablet by mouth 2 (two) times daily. 20 tablet 0   aspirin EC 81 MG tablet      atorvastatin (LIPITOR) 20 MG tablet Take 1 tablet (20 mg total) by mouth daily. 90 tablet 3   cetirizine (ZYRTEC) 5 MG tablet Take 5 mg by mouth daily.     Cholecalciferol 25 MCG (1000 UT) capsule Take 1,000 Units by mouth daily.     cyanocobalamin 1000 MCG tablet Take 500 mcg by mouth daily.     esomeprazole (NEXIUM) 40 MG capsule Take 1 capsule (40 mg total) by mouth daily at 12 noon. 90 capsule 3   fluticasone (FLONASE) 50 MCG/ACT nasal spray Place into both nostrils daily.     Magnesium 250 MG TABS Take 1 tablet by mouth daily.     Multiple Vitamins-Minerals (ALIVE WOMENS 50+ PO) Take by mouth daily.     polyethylene glycol powder (GLYCOLAX/MIRALAX) 17 GM/SCOOP powder Take by mouth.     Probiotic Product (PROBIOTIC ADVANCED) CAPS daily.      Wheat Dextrin (BENEFIBER) POWD Take 5 mg by mouth daily.     No current facility-administered medications for this visit.    Allergies as of 10/16/2022 - Review Complete 06/14/2022  Allergen Reaction Noted   Hydromorphone Other (See Comments) 11/19/2016   Meperidine Other (See Comments) 10/23/2012   Other Other (See Comments) 08/11/2006   Sulfa antibiotics Other (See Comments) 08/11/2006    Family History  Problem Relation Age of Onset   Depression Mother    Heart disease Mother    Early death Mother 30   Hypertension Mother    Thyroid disease Mother    Anxiety disorder Mother    Obesity Mother    Arthritis Father    Heart disease Father    Hypertension Father     Stroke Father    Depression Sister 50   Early death Sister    Drug abuse Sister    Lung cancer Sister    Mental illness Sister    COPD Sister    Diabetes Maternal Grandmother    Early death Maternal Grandmother    Early death Maternal Grandfather    Heart disease Maternal Grandfather    Breast cancer Cousin  Colon cancer Neg Hx    Esophageal cancer Neg Hx    Stomach cancer Neg Hx    Pancreatic cancer Neg Hx    Rectal cancer Neg Hx     Social History   Socioeconomic History   Marital status: Married    Spouse name: Yoshimi Osbun   Number of children: 3   Years of education: Not on file   Highest education level: Bachelor's degree (e.g., BA, AB, BS)  Occupational History   Occupation: retired Runner, broadcasting/film/video  Tobacco Use   Smoking status: Never    Passive exposure: Never   Smokeless tobacco: Never  Vaping Use   Vaping Use: Never used  Substance and Sexual Activity   Alcohol use: Yes    Alcohol/week: 1.0 standard drink of alcohol    Types: 1 Glasses of wine per week   Drug use: Never   Sexual activity: Yes    Partners: Male    Birth control/protection: Post-menopausal    Comment: married,intercourse age 1, less than 5 sexual partners,des neg  Other Topics Concern   Not on file  Social History Narrative   Married second family - 8 kids 14 grandkids blended   Probation officer, retired Runner, broadcasting/film/video   Caffeine 2/day   Smoker - never   EtOH - rare   No drugs   Wears her seatbelt, smoke alarm at home.   Feels safe in her relationships.   Social Determinants of Health   Financial Resource Strain: Low Risk  (04/03/2022)   Overall Financial Resource Strain (CARDIA)    Difficulty of Paying Living Expenses: Not hard at all  Food Insecurity: No Food Insecurity (04/03/2022)   Hunger Vital Sign    Worried About Running Out of Food in the Last Year: Never true    Ran Out of Food in the Last Year: Never true  Transportation Needs: No Transportation Needs (04/03/2022)   PRAPARE -  Administrator, Civil Service (Medical): No    Lack of Transportation (Non-Medical): No  Physical Activity: Inactive (04/03/2022)   Exercise Vital Sign    Days of Exercise per Week: 0 days    Minutes of Exercise per Session: 0 min  Stress: No Stress Concern Present (04/03/2022)   [employer] of Occupational Health - Occupational Stress Questionnaire    Feeling of Stress : Not at all  Social Connections: Socially Integrated (04/03/2022)   Social Connection and Isolation Panel [NHANES]    Frequency of Communication with Friends and Family: More than three times a week    Frequency of Social Gatherings with Friends and Family: Twice a week    Attends Religious Services: More than 4 times per year    Active Member of [employer] or Organizations: Yes    Attends Engineer, structural: More than 4 times per year    Marital Status: Married  Catering manager Violence: Not At Risk (04/03/2022)   Humiliation, Afraid, Rape, and Kick questionnaire    Fear of Current or Ex-Partner: No    Emotionally Abused: No    Physically Abused: No    Sexually Abused: No    Review of Systems:    Constitutional: No weight loss, fever or chills Cardiovascular: No chest pain   Respiratory: No SOB  Gastrointestinal: See HPI and otherwise negative   Physical Exam:  Vital signs: BP 126/78 (BP Location: Left Arm, Patient Position: Sitting, Cuff Size: Normal)   Pulse 73   Ht 5\' 5"  (1.651 m)   Wt 183 lb 2  oz (83.1 kg)   SpO2 98%   BMI 30.47 kg/m    Constitutional:   Pleasant Caucasian female appears to be in NAD, Well developed, Well nourished, alert and cooperative Respiratory: Respirations even and unlabored. Lungs clear to auscultation bilaterally.   No wheezes, crackles, or rhonchi.  Cardiovascular: Normal S1, S2. No MRG. Regular rate and rhythm. No peripheral edema, cyanosis or pallor.  Gastrointestinal:  Soft, nondistended, nontender. No rebound or guarding. Normal bowel sounds. No  appreciable masses or hepatomegaly. Rectal: External: Prominent perineal tissue, 1 small external hemorrhoid, non thrombosed; internal: Decreased sphincter tone especially with Valsalva; and anoscopy: Grade 1 internal hemorrhoid, brown and solid stool and otherwise normal Psychiatric: Demonstrates good judgement and reason without abnormal affect or behaviors.  RELEVANT LABS AND IMAGING: CBC    Component Value Date/Time   WBC 6.1 03/29/2022 1505   RBC 4.34 03/29/2022 1505   HGB 15.4 03/29/2022 1505   HCT 42.5 03/29/2022 1505   PLT 232 03/29/2022 1505   MCV 97.9 03/29/2022 1505   MCH 35.5 (H) 03/29/2022 1505   MCHC 36.2 (H) 03/29/2022 1505   RDW 12.1 03/29/2022 1505   LYMPHSABS 0.9 12/27/2020 0958   MONOABS 0.4 12/27/2020 0958   EOSABS 0.1 12/27/2020 0958   BASOSABS 0.0 12/27/2020 0958    CMP     Component Value Date/Time   NA 136 03/29/2022 1505   NA 140 09/12/2021 0835   K 4.2 03/29/2022 1505   CL 102 03/29/2022 1505   CO2 28 03/29/2022 1505   GLUCOSE 86 03/29/2022 1505   BUN 16 03/29/2022 1505   BUN 14 09/12/2021 0835   CREATININE 0.61 03/29/2022 1505   CALCIUM 9.2 03/29/2022 1505   PROT 6.6 03/29/2022 1505   PROT 6.6 12/09/2019 0754   ALBUMIN 4.0 12/27/2020 0958   ALBUMIN 4.2 12/09/2019 0754   AST 18 03/29/2022 1505   ALT 18 03/29/2022 1505   ALKPHOS 75 12/27/2020 0958   BILITOT 0.9 03/29/2022 1505   BILITOT 0.6 12/09/2019 0754   GFRNONAA 93 12/09/2019 0754   GFRAA 107 12/09/2019 0754    Assessment: 1.  Grade 1 internal hemorrhoid: Seen at time of exam, nonbleeding, nonthrombosed, could be contributing to some of her incontinence 2.  Fecal incontinence: With below 3.  Decreased sphincter tone: Likely contributing to incontinence and leakage of stool 4.  Constipation: Maintained on fiber and MiraLAX which is typically helpful, does have history of tortuous colon  Plan: 1.  Recommend increasing Benefiber to twice daily dosing. 2.  Prescribed Hydrocortisone  suppositories twice daily x 7 days #14 with 1 refill 3.  Encouraged the patient to maintain regular bowel movements 4.  Referred the patient to pelvic floor therapy. 5.  Patient to follow in clinic with me in 3 to 4 months or sooner if necessary.  Hyacinth Meeker, PA-C [facility] 10/16/2022, 9:35 AM  Cc: Felix Pacini A, DO

## 2022-10-16 NOTE — Patient Instructions (Addendum)
We have sent the following medications to your pharmacy for you to pick up at your convenience: Hydrocortisone Suppository    Increase Benefiber to 2 times daily  You are scheduled for a follow up visit on 02/20/23 at 9 am  If your blood pressure at your visit was 140/90 or greater, please contact your primary care physician to follow up on this.  _______________________________________________________  If you are age 68 or older, your body mass index should be between 23-30. Your Body mass index is 30.47 kg/m. If this is out of the aforementioned range listed, please consider follow up with your Primary Care Provider.  If you are age 52 or younger, your body mass index should be between 19-25. Your Body mass index is 30.47 kg/m. If this is out of the aformentioned range listed, please consider follow up with your Primary Care Provider.   ________________________________________________________  The [facility] providers would like to encourage you to use [facility] to communicate with providers for non-urgent requests or questions.  Due to long hold times on the telephone, sending your provider a message by [facility] may be a faster and more efficient way to get a response.  Please allow 48 business hours for a response.  Please remember that this is for non-urgent requests.  _______________________________________________________   Thank you for entrusting me with your care and choosing [facility].  Hyacinth Meeker PA-C

## 2022-12-02 ENCOUNTER — Ambulatory Visit: Payer: Medicare Other | Admitting: Nurse Practitioner

## 2022-12-13 ENCOUNTER — Ambulatory Visit: Payer: Medicare Other | Admitting: Nurse Practitioner

## 2022-12-24 ENCOUNTER — Ambulatory Visit: Payer: Medicare Other | Attending: Internal Medicine | Admitting: Internal Medicine

## 2022-12-24 ENCOUNTER — Encounter: Payer: Self-pay | Admitting: Internal Medicine

## 2022-12-24 VITALS — BP 120/74 | HR 73 | Ht 65.0 in | Wt 186.8 lb

## 2022-12-24 DIAGNOSIS — Q2112 Patent foramen ovale: Secondary | ICD-10-CM | POA: Insufficient documentation

## 2022-12-24 DIAGNOSIS — H93A3 Pulsatile tinnitus, bilateral: Secondary | ICD-10-CM

## 2022-12-24 DIAGNOSIS — M79604 Pain in right leg: Secondary | ICD-10-CM | POA: Insufficient documentation

## 2022-12-24 DIAGNOSIS — R9431 Abnormal electrocardiogram [ECG] [EKG]: Secondary | ICD-10-CM | POA: Diagnosis present

## 2022-12-24 DIAGNOSIS — M5489 Other dorsalgia: Secondary | ICD-10-CM | POA: Diagnosis present

## 2022-12-24 DIAGNOSIS — Z6830 Body mass index (BMI) 30.0-30.9, adult: Secondary | ICD-10-CM | POA: Diagnosis present

## 2022-12-24 DIAGNOSIS — H93A9 Pulsatile tinnitus, unspecified ear: Secondary | ICD-10-CM | POA: Insufficient documentation

## 2022-12-24 DIAGNOSIS — M79605 Pain in left leg: Secondary | ICD-10-CM | POA: Diagnosis present

## 2022-12-24 NOTE — Progress Notes (Addendum)
Cardiology Office Note:    Date:  12/24/2022   ID:  Kayla Rosales, DOB [birth year], MRN [id number]  PCP:  Kayla Leatherwood, DO  Cardiologist:  Kayla Poisson, MD  Electrophysiologist:  None   Referring MD: Kayla Leatherwood, DO   Chief Complaint/Reason for Referral: Follow up; palpitations, murmur  History of Present Illness:    Kayla Rosales is a 67 y.o. female with a history of stroke in 2017 while on HRT due to early menopause, with no residual deficits, mild OSA with possible plan for septorhinoplasty, improved with weight loss, murmur with no valve disease on echo, and palpitations with infrequent SVT and ectopy, not requiring treatment. She presents for follow up.   Leg discomfort resolved off of well water, but also coincides with finishing PT. Occasional pulsatile tinnitus, we will complete carotid screen. Did not do statin holiday since leg discomfort resolved.   Endorses difficulty with weight loss despite enrollment in [facility]. We discussed GLP1-a.   The patient denies chest pain, chest pressure, dyspnea at rest, palpitations, PND, orthopnea. Denies cough, fever, chills. Denies nausea, vomiting. Denies syncope or presyncope. Denies dizziness or lightheadedness.   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 Still  unclear what is cuasing her pain. May be nerve related. May benefit from starting on gabapentin. Start gabapentin 300 mg TID. F/u 4 weeks   Night sweats    Palpitations    Recurrent upper respiratory infection (URI)    Right arm weakness 09/28/2015   Last Assessment & Plan:  Formatting of this note might be different from the original. Referral to PT today. 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    Vertigo 08/11/2015   Last Assessment & Plan:  Formatting of this note might be different from the original. Concern given gradual onset over the last week and a half in addition to neck pain. Patient needs neuroimaging to rule out dissecting vertebral aneurysm. Patient advised to go to emergency room. She declined ambulance transport   Vitamin B 12 deficiency    Vitamin D deficiency     Past Surgical History:  Procedure Laterality Date   CARPAL TUNNEL RELEASE     [facility] [place] repeat recommended   COLONOSCOPY W/ POLYPECTOMY  05/26/2018   x5 polyps   KNEE ARTHROSCOPY Bilateral 2013   meniscus  x2   NECK SURGERY  2018   disc fusion   UPPER GASTROINTESTINAL ENDOSCOPY      Current Medications: Current Meds  Medication Sig   albuterol (VENTOLIN HFA) 108 (90 Base) MCG/ACT inhaler TAKE 2 PUFFS BY MOUTH EVERY 6 HOURS AS NEEDED FOR WHEEZE OR SHORTNESS OF BREATH   aspirin EC 81 MG tablet Take 81 mg by mouth daily.   atorvastatin (LIPITOR) 20 MG tablet Take 1 tablet (20 mg total) by mouth daily.   cetirizine (ZYRTEC) 5 MG tablet Take 5 mg by mouth daily as needed for allergies.   Cholecalciferol 25 MCG (1000 UT) capsule Take 1,000 Units by mouth daily.   cyanocobalamin 1000 MCG tablet Take 500 mcg by mouth daily.   esomeprazole (NEXIUM) 40 MG capsule Take 1 capsule (40 mg total) by mouth daily at 12 noon.   fluticasone (FLONASE) 50 MCG/ACT nasal  spray Place 2 sprays into both nostrils daily.   Magnesium 250 MG TABS Take 1 tablet by mouth daily.   polyethylene glycol powder (GLYCOLAX/MIRALAX) 17 GM/SCOOP powder Take by mouth.   Probiotic Product (PROBIOTIC ADVANCED) CAPS daily.    Wheat Dextrin (BENEFIBER) POWD Take 5 mg by mouth daily.     Allergies:   Hydromorphone, Meperidine, Other, and Sulfa antibiotics   Social History   Tobacco Use   Smoking status: Never    Passive exposure: Never   Smokeless tobacco: Never  Vaping Use   Vaping status: Never Used  Substance Use Topics   Alcohol use: Yes    Alcohol/week: 1.0 standard drink of alcohol    Types: 1 Glasses of wine per week   Drug use: Never     Family History: The patient's family history includes Anxiety disorder in her mother; Arthritis in her father; Breast cancer in her cousin; COPD in her sister; Depression in her mother; Depression (age of onset: 62) in her sister; Diabetes in her maternal grandmother; Drug abuse in her sister; Early death in her maternal grandfather, maternal grandmother, and sister; Early death (age of onset: 46) in her mother; Heart disease in her father, maternal grandfather, and mother; Hypertension in her father and mother; Lung cancer in her sister; Mental illness in her sister; Obesity in her mother; Stroke in her father; Thyroid disease in her mother. There is no history of Colon cancer, Esophageal cancer, Stomach cancer, Pancreatic cancer, or Rectal cancer.  ROS:   Please see the history of present illness.     All other systems reviewed and are negative.  EKGs/Labs/Other Studies Reviewed:    The following studies were reviewed today:  CT Abdomen/Pelvis 01/04/2021: FINDINGS: Lower chest: No acute abnormality.   Hepatobiliary: No suspicious hepatic lesion. Calcified hepatic granulomata. Gallbladder is unremarkable. No biliary ductal dilation.   Pancreas: Unremarkable. No pancreatic ductal dilatation or surrounding inflammatory  changes.   Spleen: Calcified splenic granulomata.   Adrenals/Urinary Tract: Adrenal glands are unremarkable. Kidneys are normal, without renal calculi, solid enhancing lesion, or hydronephrosis. Bladder is unremarkable for degree of distension.   Stomach/Bowel: Radiopaque enteric contrast traverses the splenic flexure. Stomach is decompressed limiting evaluation. No pathologic dilation of small or large bowel. The appendix and terminal ileum appear normal. Colonic diverticulosis without findings of acute diverticulitis. Small volume of formed stool throughout the colon.   Vascular/Lymphatic: Aortic and branch vessel atherosclerosis without abdominal aortic aneurysm. No pathologically enlarged abdominal or pelvic lymph nodes.   Reproductive: Uterus and bilateral adnexa are unremarkable.   Other: No significant abdominopelvic ascites. Small fat containing paraumbilical hernia.   Musculoskeletal: Multilevel degenerative changes spine. Chronic changes of osteitis pubis. No acute osseous abnormality.   IMPRESSION: 1. No acute abdominopelvic findings.  2. Colonic diverticulosis without findings of acute diverticulitis. 3. Small volume of formed stool in the colon. 4.  Aortic Atherosclerosis (ICD10-I70.0).  Monitor 08/2019: Indication: palpitations   Minimum HR (bpm): 48 Maximum HR (bpm): 210   Supraventricular Ectopy: rare <1% SVT: 13 episodes of SVT, longest was 13 beats at 117 bpm, fastest was 10 beats at 210 bpm.   Ventricular Ectopy: rare <1% NSVT: none Ventricular Tachycardia: none   Pauses: none AV block: none   Atrial fibrillation: none   Diary events: Monitor trigger with sinus rhythm and supraventricular ectopy.    IMPRESSION: Brief episodes of SVT without associated diary events. Diary events correlate with sinus rhythm and supraventricular ectopy.   Echo 07/30/2019:  1. Left ventricular ejection fraction, by estimation, is 60 to 65%. The  left ventricle has  normal function. The left ventricle has no regional  wall motion abnormalities. Left ventricular diastolic parameters are  consistent with Grade I diastolic  dysfunction (impaired relaxation).   2. Right ventricular systolic function is normal. The right ventricular  size is normal. There is normal pulmonary artery systolic pressure. The  estimated right ventricular systolic pressure is 18.8 mmHg.   3. Right atrial size was mildly dilated.   4. The mitral valve is normal in structure. Trivial mitral valve  regurgitation. No evidence of mitral stenosis.   5. The aortic valve is tricuspid. Aortic valve regurgitation is trivial.  No aortic stenosis is present.   6. The inferior vena cava is normal in size with greater than 50%  respiratory variability, suggesting right atrial pressure of 3 mmHg.   EKG:  EKG is personally reviewed. EKG Interpretation Date/Time:  Tuesday December 24 2022 09:11:45 EDT Ventricular Rate:  71 PR Interval:  168 QRS Duration:  76 QT Interval:  400 QTC Calculation: 434 R Axis:   -29  Text Interpretation: Normal sinus rhythm Minimal voltage criteria for LVH, may be normal variant ( R in aVL ) Confirmed by Weston Brass ([id number]) on 12/28/2022 2:22:18 PM   06/14/22: NSR, poor R wave prog 08/27/2021:  Sinus rhythm. Poor R wave progression. 02/23/2020: NSR  Recent Labs: 03/29/2022: ALT 18; BUN 16; Creat 0.61; Hemoglobin 15.4; Magnesium 2.1; Platelets 232; Potassium 4.2; Sodium 136; TSH 1.48   Recent Lipid Panel    Component Value Date/Time   CHOL 122 03/29/2022 1505   CHOL 132 01/21/2022 1112   TRIG 209 (H) 03/29/2022 1505   HDL 38 (L) 03/29/2022 1505   HDL 45 01/21/2022 1112   CHOLHDL 3.2 03/29/2022 1505   VLDL 18.0 03/19/2021 0952   LDLCALC 57 03/29/2022 1505    Physical Exam:    VS:  BP 120/74 (BP Location: Left Arm, Patient Position: Sitting, Cuff Size: Normal)   Pulse 73   Ht 5\' 5"  (1.651 m)   Wt 186 lb 12.8 oz (84.7 kg)   SpO2 95%   BMI  31.09 kg/m     Wt Readings from Last 5 Encounters:  12/24/22 186 lb 12.8 oz (84.7 kg)  10/16/22 183 lb 2 oz (83.1 kg)  06/14/22 185 lb 12.8 oz (84.3 kg)  06/05/22 182 lb 3.2 oz (82.6 kg)  04/23/22 177 lb (80.3 kg)    Constitutional: No acute distress Eyes: sclera non-icteric, normal conjunctiva and lids ENMT: normal dentition, moist mucous membranes Cardiovascular: regular rhythm, normal rate, no murmurs. S1 and S2 normal. No jugular venous distention.  Respiratory: clear to auscultation bilaterally GI : normal bowel sounds, soft and nontender. No distention.   MSK: extremities  warm, well perfused. No edema.  NEURO: grossly nonfocal exam, moves all extremities. PSYCH: alert and oriented x 3, normal mood and affect.   ASSESSMENT:    1. Pulsatile tinnitus   2. BMI 30.0-30.9,adult   3. Pain in both lower extremities   4. PFO (patent foramen ovale)   5. Interscapular pain   6. Abnormal electrocardiogram   7. Pulsatile tinnitus of both ears     PLAN:    Pulsatile tinnitus, both ears - will get carotid dopplers  LE pain - vascular dopplers normal - resolved with cessation of drinking well water.   Interscapular back pain Abnormal EKG -coronary cta with mild-moderate nonobstructive CAD, no significant recurrence of discomfort.   Palpitations - Plan: EKG 12-Lead -No significant recurrence  History of stroke while on hormone replacement therapy PFO - no residual deficit. enteric-coated aspirin and statin as above.  - does have a small PFO. Reviewed with interventional cardiology, will observe for now. Since this is quite small, we can likely watch this over time and be quite careful to avoid blood clots in the legs.   Snoring - mild OSA, no recommendation for appliance at that time. Improved with weight loss.  BMI 31 - will refer to CVRR for discussion of GLP1-a.   Total time of encounter: 30 minutes total time of encounter, including 20 minutes spent in  face-to-face patient care on the date of this encounter. This time includes coordination of care and counseling regarding above mentioned problem list. Remainder of non-face-to-face time involved reviewing chart documents/testing relevant to the patient encounter and documentation in the medical record. I have independently reviewed documentation from referring provider.   Weston Brass, MD, [facility] [place]  [facility]    Medication Adjustments/Labs and Tests Ordered: Current medicines are reviewed at length with the patient today.  Concerns regarding medicines are outlined above.   Orders Placed This Encounter  Procedures   AMB Referral to [facility] Pharm-D   EKG 12-Lead   VAS US CAROTID    No orders of the defined types were placed in this encounter.   Patient Instructions  Medication Instructions:  - No changes  *If you need a refill on your cardiac medications before your next appointment, please call your pharmacy*   Lab Work: - None ordered   Testing/Procedures: Your physician has requested that you have a carotid duplex. This test is an ultrasound of the carotid arteries in your neck. It looks at blood flow through these arteries that supply the brain with blood. Allow one hour for this exam. There are no restrictions or special instructions. This will take place at [address] [facility] [address] [facility] [address], [address].    Follow-Up: At [facility], you and your health needs are our priority.  As part of our continuing mission to provide you with exceptional heart care, we have created designated Provider Care Teams.  These Care Teams include your primary Cardiologist (physician) and Advanced Practice Providers (APPs -  Physician Assistants and Nurse Practitioners) who all work together to provide you with the care you need, when you need it.  We recommend signing up for the patient portal called "MyChart".  Sign up information is provided on this After Visit Summary.  MyChart  is used to connect with patients for Virtual Visits (Telemedicine).  Patients are able to view lab/test results, encounter notes, upcoming appointments, etc.  Non-urgent messages can be sent to your provider as well.   To learn more about what you can do with  MyChart, go to [email or web address].    Your next appointment:   1 year(s)  Provider:   Parke Poisson, MD     Other: - You have been referred to see a [facility] pharmacist to discuss starting a weight loss medication.

## 2022-12-24 NOTE — Patient Instructions (Addendum)
Medication Instructions:  - No changes  *If you need a refill on your cardiac medications before your next appointment, please call your pharmacy*   Lab Work: - None ordered   Testing/Procedures: Your physician has requested that you have a carotid duplex. This test is an ultrasound of the carotid arteries in your neck. It looks at blood flow through these arteries that supply the brain with blood. Allow one hour for this exam. There are no restrictions or special instructions. This will take place at [address] [facility], [address].    Follow-Up: At [facility], you and your health needs are our priority.  As part of our continuing mission to provide you with exceptional heart care, we have created designated Provider Care Teams.  These Care Teams include your primary Cardiologist (physician) and Advanced Practice Providers (APPs -  Physician Assistants and Nurse Practitioners) who all work together to provide you with the care you need, when you need it.  We recommend signing up for the patient portal called "MyChart".  Sign up information is provided on this After Visit Summary.  MyChart is used to connect with patients for Virtual Visits (Telemedicine).  Patients are able to view lab/test results, encounter notes, upcoming appointments, etc.  Non-urgent messages can be sent to your provider as well.   To learn more about what you can do with MyChart, go to [email or web address].    Your next appointment:   1 year(s)  Provider:   Parke Poisson, MD     Other: - You have been referred to see a [facility] pharmacist to discuss starting a weight loss medication.

## 2022-12-25 ENCOUNTER — Ambulatory Visit (HOSPITAL_COMMUNITY)
Admission: RE | Admit: 2022-12-25 | Discharge: 2022-12-25 | Disposition: A | Discharge status: Home / Self Care | Payer: Medicare Other | Source: Ambulatory Visit | Attending: Internal Medicine | Admitting: Internal Medicine

## 2022-12-25 DIAGNOSIS — H93A3 Pulsatile tinnitus, bilateral: Secondary | ICD-10-CM

## 2022-12-25 DIAGNOSIS — H93A9 Pulsatile tinnitus, unspecified ear: Secondary | ICD-10-CM | POA: Diagnosis present

## 2022-12-27 ENCOUNTER — Encounter: Payer: Self-pay | Admitting: Internal Medicine

## 2023-01-01 ENCOUNTER — Ambulatory Visit: Payer: Medicare Other

## 2023-02-20 ENCOUNTER — Ambulatory Visit: Payer: Medicare Other | Admitting: Physician Assistant

## 2023-02-26 ENCOUNTER — Ambulatory Visit (INDEPENDENT_AMBULATORY_CARE_PROVIDER_SITE_OTHER): Payer: Medicare Other

## 2023-02-26 DIAGNOSIS — Z23 Encounter for immunization: Secondary | ICD-10-CM | POA: Diagnosis not present

## 2023-02-26 NOTE — Progress Notes (Signed)
Pt in for high dose flu vaccine per Dr Claiborne Billings.   Injection tolerated well.

## 2023-03-19 ENCOUNTER — Ambulatory Visit (INDEPENDENT_AMBULATORY_CARE_PROVIDER_SITE_OTHER): Payer: Medicare Other | Admitting: Physician Assistant

## 2023-03-19 ENCOUNTER — Encounter: Payer: Self-pay | Admitting: Physician Assistant

## 2023-03-19 VITALS — BP 114/76 | HR 71 | Ht 65.0 in | Wt 187.0 lb

## 2023-03-19 DIAGNOSIS — R1084 Generalized abdominal pain: Secondary | ICD-10-CM

## 2023-03-19 DIAGNOSIS — R143 Flatulence: Secondary | ICD-10-CM | POA: Diagnosis not present

## 2023-03-19 DIAGNOSIS — R194 Change in bowel habit: Secondary | ICD-10-CM | POA: Diagnosis not present

## 2023-03-19 NOTE — Progress Notes (Signed)
Chief Complaint: Hemorrhoids and decreased sphincter tone  HPI:    Kayla Rosales is a 67 year old female, known to Dr. Leone Payor, who returns to clinic today for follow-up of hemorrhoids, fecal leakage and constipation.   12/27/2020 office visit with Alcide Evener, NP at that time with rectal pain, no abnormality seen on rectal exam.  Recommended Desitin.    02/01/2021 colonoscopy and EGD.  Colonoscopy at three 4-7 mm polyps in the transverse colon and cecum, diverticulosis in the sigmoid colon and descending colon and redundant colon.  Pathology showed sessile serrated polyps maximum 7 mm.  Recall placed for 2025.  EGD normal.    09/16/2022 patient contacted our clinic and described issues with hemorrhoids.  At that time discussed Desitin in the anal opening into the external area 3 times a day.  Also Preparation H suppositories.    11/12/2022 patient followed in clinic and described that she found the Desitin uncomfortable so did not use it.  At that time continued with alternating bowel habits and some rectal discomfort.  She was using 1 dose of Benefiber daily.  At that time on rectal exam there is prominent perianal tissue and 1 small external hemorrhoid, decreased sphincter tone, and anoscopy grade 1 internal hemorrhoid.  At that point recommend she increase her Benefiber to twice daily and prescribed Hydrocortisone suppositories.  Also referred her for pelvic floor therapy.    Today, the patient tells me that after using the Hydrocortisone suppositories this really helped all of her rectal irritation and also fecal incontinence.  Now tells me that she just overall has a sensitive GI system and feels like certain foods tend to bother her.  For a long time she did MiraLAX every other night and Benefiber twice daily but felt like this was making her excessively bloated so she stopped it a few weeks ago to see how she was doing and feels like the bloating is some better and her bowel habits have not  really changed.  In fact they have gotten a little bit better over the past few days.  Continues to have issues with occasional diarrhea and occasional constipation.  Tells me she is trying to eat more healthily and avoid sugars she feels like this gives her a lot of symptoms.  Asked if she could do a bowel purge to kind of clear things out and then trial an elimination diet to figure out what is bothering her.  Also has had some excessive malodorous gas lately.    Patient's daughter is a PA who works in Ha[employer].    Denies fever, chills or weight loss.  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 Still unclear what  is cuasing her pain. May be nerve related. May benefit from starting on gabapentin. Start gabapentin 300 mg TID. F/u 4 weeks   Night sweats    Palpitations    Recurrent upper respiratory infection (URI)    Right arm weakness 09/28/2015   Last Assessment & Plan:  Formatting of this note might be different from the original. Referral to PT today. 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    Vertigo 08/11/2015   Last Assessment & Plan:  Formatting of this note might be different from the original. Concern given gradual onset over the  last week and a half in addition to neck pain. Patient needs neuroimaging to rule out dissecting vertebral aneurysm. Patient advised to go to emergency room. She declined ambulance transport   Vitamin B 12 deficiency    Vitamin D deficiency     Past Surgical History:  Procedure Laterality Date   CARPAL TUNNEL RELEASE     [facility] [place]   COLONOSCOPY W/ POLYPECTOMY  05/26/2018   x5 polyps   KNEE ARTHROSCOPY Bilateral 2013   meniscus  x2   NECK SURGERY  2018   disc fusion   UPPER GASTROINTESTINAL ENDOSCOPY      Current Outpatient Medications  Medication Sig Dispense Refill   albuterol (VENTOLIN HFA) 108 (90 Base) MCG/ACT inhaler TAKE 2 PUFFS BY MOUTH EVERY 6 HOURS AS NEEDED FOR WHEEZE OR SHORTNESS OF BREATH 8.5 each 0   aspirin EC 81 MG tablet Take 81 mg by mouth daily.     atorvastatin (LIPITOR) 20 MG tablet Take 1 tablet (20 mg total) by mouth daily. 90 tablet 3   cetirizine (ZYRTEC) 5 MG tablet Take 5 mg by mouth daily as needed for allergies.     Cholecalciferol 25 MCG (1000 UT) capsule Take 1,000 Units by mouth daily.     cyanocobalamin 1000 MCG tablet Take 500 mcg by mouth daily.     esomeprazole (NEXIUM) 40 MG capsule Take 1 capsule (40 mg total) by mouth daily at 12 noon. 90 capsule 3   fluticasone (FLONASE) 50 MCG/ACT nasal spray Place 2 sprays into both nostrils daily.     Magnesium 250 MG TABS Take 1 tablet by mouth daily.     Multiple Vitamins-Minerals (ALIVE WOMENS 50+ PO) Take by mouth daily. (Patient not taking: Reported on 12/24/2022)     polyethylene glycol powder (GLYCOLAX/MIRALAX) 17 GM/SCOOP powder Take by mouth.     Probiotic Product (PROBIOTIC ADVANCED) CAPS daily.      Wheat Dextrin (BENEFIBER) POWD Take 5 mg by mouth daily.     No current facility-administered medications for this visit.    Allergies as of 03/19/2023 - Review Complete 03/19/2023  Allergen Reaction Noted   Hydromorphone Other (See  Comments) 11/19/2016   Meperidine Other (See Comments) 10/23/2012   Other Other (See Comments) 08/11/2006   Sulfa antibiotics Other (See Comments) 08/11/2006    Family History  Problem Relation Age of Onset   Depression Mother    Heart disease Mother    Early death Mother 27   Hypertension Mother    Thyroid disease Mother    Anxiety disorder Mother    Obesity Mother    Arthritis Father    Heart disease Father    Hypertension Father    Stroke Father  Depression Sister 80   Early death Sister    Drug abuse Sister    Lung cancer Sister    Mental illness Sister    COPD Sister    Diabetes Maternal Grandmother    Early death Maternal Grandmother    Early death Maternal Grandfather    Heart disease Maternal Grandfather    Breast cancer Cousin    Colon cancer Neg Hx    Esophageal cancer Neg Hx    Stomach cancer Neg Hx    Pancreatic cancer Neg Hx    Rectal cancer Neg Hx     Social History   Socioeconomic History   Marital status: Married    Spouse name: Kayla Rosales   Number of children: 3   Years of education: Not on file   Highest education level: Bachelor's degree (e.g., BA, AB, BS)  Occupational History   Occupation: retired Runner, broadcasting/film/video  Tobacco Use   Smoking status: Never    Passive exposure: Never   Smokeless tobacco: Never  Vaping Use   Vaping status: Never Used  Substance and Sexual Activity   Alcohol use: Yes    Alcohol/week: 1.0 standard drink of alcohol    Types: 1 Glasses of wine per week   Drug use: Never   Sexual activity: Yes    Partners: Male    Birth control/protection: Post-menopausal    Comment: married,intercourse age 46, less than 5 sexual partners,des neg  Other Topics Concern   Not on file  Social History Narrative   Married second family - 8 kids 14 grandkids blended   Probation officer, retired Runner, broadcasting/film/video   Caffeine 2/day   Smoker - never   EtOH - rare   No drugs   Wears her seatbelt, smoke alarm at home.   Feels safe in her  relationships.   Social Determinants of Health   Financial Resource Strain: Low Risk  (04/03/2022)   Overall Financial Resource Strain (CARDIA)    Difficulty of Paying Living Expenses: Not hard at all  Food Insecurity: No Food Insecurity (04/03/2022)   Hunger Vital Sign    Worried About Running Out of Food in the Last Year: Never true    Ran Out of Food in the Last Year: Never true  Transportation Needs: No Transportation Needs (04/03/2022)   PRAPARE - Administrator, Civil Service (Medical): No    Lack of Transportation (Non-Medical): No  Physical Activity: Inactive (04/03/2022)   Exercise Vital Sign    Days of Exercise per Week: 0 days    Minutes of Exercise per Session: 0 min  Stress: No Stress Concern Present (04/03/2022)   [employer] of Occupational Health - Occupational Stress Questionnaire    Feeling of Stress : Not at all  Social Connections: Unknown (01/30/2023)   Received from [facility]   Social Network    Social Network: Not on file  Intimate Partner Violence: Unknown (01/30/2023)   Received from [facility]   HITS    Physically Hurt: Not on file    Insult or Talk Down To: Not on file    Threaten Physical Harm: Not on file    Scream or Curse: Not on file    Review of Systems:    Constitutional: No weight loss, fever or chills Cardiovascular: No chest pain   Respiratory: No SOB Gastrointestinal: See HPI and otherwise negative   Physical Exam:  Vital signs: BP 114/76   Pulse 71   Ht 5\' 5"  (1.651 m)   Wt 187  lb (84.8 kg)   SpO2 96%   BMI 31.12 kg/m    Constitutional:   Pleasant Caucasian female appears to be in NAD, Well developed, Well nourished, alert and cooperative Respiratory: Respirations even and unlabored. Lungs clear to auscultation bilaterally.   No wheezes, crackles, or rhonchi.  Cardiovascular: Normal S1, S2. No MRG. Regular rate and rhythm. No peripheral edema, cyanosis or pallor.  Gastrointestinal:  Soft,  nondistended, nontender. No rebound or guarding. Normal bowel sounds. No appreciable masses or hepatomegaly. Rectal:  Not performed.  Psychiatric: Oriented to person, place and time. Demonstrates good judgement and reason without abnormal affect or behaviors.  RELEVANT LABS AND IMAGING: CBC    Component Value Date/Time   WBC 6.1 03/29/2022 1505   RBC 4.34 03/29/2022 1505   HGB 15.4 03/29/2022 1505   HCT 42.5 03/29/2022 1505   PLT 232 03/29/2022 1505   MCV 97.9 03/29/2022 1505   MCH 35.5 (H) 03/29/2022 1505   MCHC 36.2 (H) 03/29/2022 1505   RDW 12.1 03/29/2022 1505   LYMPHSABS 0.9 12/27/2020 0958   MONOABS 0.4 12/27/2020 0958   EOSABS 0.1 12/27/2020 0958   BASOSABS 0.0 12/27/2020 0958    CMP     Component Value Date/Time   NA 136 03/29/2022 1505   NA 140 09/12/2021 0835   K 4.2 03/29/2022 1505   CL 102 03/29/2022 1505   CO2 28 03/29/2022 1505   GLUCOSE 86 03/29/2022 1505   BUN 16 03/29/2022 1505   BUN 14 09/12/2021 0835   CREATININE 0.61 03/29/2022 1505   CALCIUM 9.2 03/29/2022 1505   PROT 6.6 03/29/2022 1505   PROT 6.6 12/09/2019 0754   ALBUMIN 4.0 12/27/2020 0958   ALBUMIN 4.2 12/09/2019 0754   AST 18 03/29/2022 1505   ALT 18 03/29/2022 1505   ALKPHOS 75 12/27/2020 0958   BILITOT 0.9 03/29/2022 1505   BILITOT 0.6 12/09/2019 0754   GFRNONAA 93 12/09/2019 0754   GFRAA 107 12/09/2019 0754    Assessment: 1.  Generalized abdominal discomfort: More bloated at times than others, patient feels related to certain types of food; Likely diet IBS +/- SIBO 2.  Change in bowel habits: Likely related to diet +/- IBS +/- tortuous colon 3.  Gas  Plan: 1.  Recommend patient do a MiraLAX bowel purge and then trial the low FODMAP elimination diet.  She was given information in regards to this. 2.  In the future if gas continues could test for SIBO 3.  Patient to follow in clinic with me in 3 months.  Hyacinth Meeker, PA-C [facility] 03/19/2023, 11:40 AM  Cc:  Felix Pacini A, DO

## 2023-03-19 NOTE — Patient Instructions (Addendum)
Dr Hyacinth Meeker  recommends that you complete a bowel purge (to clean out your bowels). Please do the following: Purchase a bottle of Miralax over the counter as well as a box of 5 mg dulcolax tablets. Take 4 dulcolax tablets. Wait 1 hour. You will then drink 6-8 capfuls of Miralax mixed in an adequate amount of water/juice/gatorade (you may choose which of these liquids to drink) over the next 2-3 hours. You should expect results within 1 to 6 hours after completing the bowel purge.   Follow up in 3 months  If your blood pressure at your visit was 140/90 or greater, please contact your primary care physician to follow up on this.  _______________________________________________________  If you are age 21 or older, your body mass index should be between 23-30. Your Body mass index is 31.12 kg/m. If this is out of the aforementioned range listed, please consider follow up with your Primary Care Provider.  If you are age 60 or younger, your body mass index should be between 19-25. Your Body mass index is 31.12 kg/m. If this is out of the aformentioned range listed, please consider follow up with your Primary Care Provider.   ________________________________________________________  The  GI providers would like to encourage you to use [facility] to communicate with providers for non-urgent requests or questions.  Due to long hold times on the telephone, sending your provider a message by [facility] may be a faster and more efficient way to get a response.  Please allow 48 business hours for a response.  Please remember that this is for non-urgent requests.  _______________________________________________________   Thank you for entrusting me with your care and choosing [facility].  Hyacinth Meeker PA-C

## 2023-04-07 ENCOUNTER — Telehealth: Payer: Self-pay | Admitting: Pharmacist Clinician (PhC)/ Clinical Pharmacy Specialist

## 2023-04-07 ENCOUNTER — Other Ambulatory Visit (HOSPITAL_COMMUNITY): Payer: Self-pay

## 2023-04-07 ENCOUNTER — Telehealth: Payer: Self-pay | Admitting: Pharmacy Technician

## 2023-04-07 ENCOUNTER — Ambulatory Visit: Payer: Medicare Other | Attending: Cardiology | Admitting: Pharmacist Clinician (PhC)/ Clinical Pharmacy Specialist

## 2023-04-07 ENCOUNTER — Encounter: Payer: Self-pay | Admitting: Pharmacist Clinician (PhC)/ Clinical Pharmacy Specialist

## 2023-04-07 VITALS — Ht 65.0 in | Wt 189.2 lb

## 2023-04-07 DIAGNOSIS — E6609 Other obesity due to excess calories: Secondary | ICD-10-CM | POA: Diagnosis present

## 2023-04-07 DIAGNOSIS — Z6830 Body mass index (BMI) 30.0-30.9, adult: Secondary | ICD-10-CM | POA: Insufficient documentation

## 2023-04-07 DIAGNOSIS — E66811 Obesity, class 1: Secondary | ICD-10-CM | POA: Insufficient documentation

## 2023-04-07 NOTE — Progress Notes (Signed)
Office Visit    Patient Name: Kayla Rosales Date of Encounter: 04/07/2023  Primary Care Provider:  Natalia Leatherwood, DO Primary Cardiologist:  Parke Poisson, MD  Chief Complaint    Weight management  Significant Past Medical History   CVA 2017, no residual deficits  OSA Mild, no current treatment  HLD 12/23 LDL 57 on atorvastatin 20, trigs 209    Allergies  Allergen Reactions   Hydromorphone Other (See Comments)    HYPOTENSION   Meperidine Other (See Comments)    "BP drops"   Other Other (See Comments)    Pt thinks she has hx of allergic reaction   States she does not know the reaction   Sulfa Antibiotics Other (See Comments)    States she does not know the reaction     History of Present Illness    Kayla Rosales is a 67 y.o. female patient of Dr Jacques Navy, in the office to discuss options for weight management.  She has previously worked with [employer] [facility], and did lose about 20 pounds, but found the dietary restrictions and frequent visits to be unsustainable.   She has some GI issues to begin with, and follows a gluten free diet.  Over the years she has adjusted her diet to deal with GI distress and bloating.  Admits to being a snack eater, can eat a full meal then be hungry 10-15 minutes later.  If she is busy, may skip a meal, but then feels like she eats more later to make up for it  Current weight management medications: none  Previously tried: [facility]  Current meds that may affect weight: none  Baseline weight/BMI: 189.2 lb // 31.48  Insurance payor:  [employer]   [id number]  Diet: gluten free, admits snacking is her worst problem (chocolate, carbs)  Exercise: no but stays active, (chasing grandkids - 5 youngest, 18 total)  Confirmed patient not pregnant and no personal or family history of medullary thyroid carcinoma (MTC) or Multiple Endocrine Neoplasia syndrome type 2 (MEN 2).   Social History:   Tobacco:  no  Alcohol: wine few times per week  Caffeine: coffee, (diluted)   Accessory Clinical Findings    Lab Results  Component Value Date   CREATININE 0.61 03/29/2022   BUN 16 03/29/2022   NA 136 03/29/2022   K 4.2 03/29/2022   CL 102 03/29/2022   CO2 28 03/29/2022   Lab Results  Component Value Date   ALT 18 03/29/2022   AST 18 03/29/2022   ALKPHOS 75 12/27/2020   BILITOT 0.9 03/29/2022   Lab Results  Component Value Date   HGBA1C 5.3 03/29/2022      Home Medications/Allergies    Current Outpatient Medications  Medication Sig Dispense Refill   albuterol (VENTOLIN HFA) 108 (90 Base) MCG/ACT inhaler TAKE 2 PUFFS BY MOUTH EVERY 6 HOURS AS NEEDED FOR WHEEZE OR SHORTNESS OF BREATH 8.5 each 0   aspirin EC 81 MG tablet Take 81 mg by mouth daily.     atorvastatin (LIPITOR) 20 MG tablet Take 1 tablet (20 mg total) by mouth daily. 90 tablet 3   cetirizine (ZYRTEC) 5 MG tablet Take 5 mg by mouth daily as needed for allergies.     Cholecalciferol 25 MCG (1000 UT) capsule Take 1,000 Units by mouth daily.     cyanocobalamin 1000 MCG tablet Take 500 mcg by mouth daily.     esomeprazole (NEXIUM) 40 MG capsule Take  1 capsule (40 mg total) by mouth daily at 12 noon. 90 capsule 3   fluticasone (FLONASE) 50 MCG/ACT nasal spray Place 2 sprays into both nostrils daily.     loratadine (CLARITIN) 10 MG tablet Take 10 mg by mouth daily.     Magnesium 250 MG TABS Take 1 tablet by mouth daily.     Multiple Vitamins-Minerals (ALIVE WOMENS 50+ PO) Take by mouth daily.     polyethylene glycol powder (GLYCOLAX/MIRALAX) 17 GM/SCOOP powder Take by mouth.     Probiotic Product (PROBIOTIC ADVANCED) CAPS daily.      No current facility-administered medications for this visit.     Allergies  Allergen Reactions   Hydromorphone Other (See Comments)    HYPOTENSION   Meperidine Other (See Comments)    "BP drops"   Other Other (See Comments)    Pt thinks she has hx of allergic reaction   States she  does not know the reaction   Sulfa Antibiotics Other (See Comments)    States she does not know the reaction     Assessment & Plan    Class 1 obesity with serious comorbidity and body mass index (BMI) of 30.0 to 30.9 in adult  Patient has not met goal of at least 5% of body weight loss with comprehensive lifestyle modifications alone in the past 3-6 months. Pharmacotherapy is appropriate to pursue as augmentation. Will start Wegovy.   Confirmed patient not pregnant and no personal or family history of medullary thyroid carcinoma (MTC) or Multiple Endocrine Neoplasia syndrome type 2 (MEN 2).   Advised patient on common side effects including nausea, diarrhea, dyspepsia, decreased appetite, and fatigue. Counseled patient on reducing meal size and how to titrate medication to minimize side effects. Patient aware to call if intolerable side effects or if experiencing dehydration, abdominal pain, or dizziness. Patient will adhere to dietary modifications and will target at least 150 minutes of moderate intensity exercise weekly.   Injection technique reviewed at today's visit.    Titration Plan:  Will plan to follow the titration plan as below, pending patient is tolerating each dose before increasing to the next. Can slow titration if needed for tolerability.    -Month 1: Inject 0.25 mg SQ once weekly x 4 weeks -Month 2: Inject 0.5 mg SQ once weekly x 4 weeks -Month 3: Inject 1 mg SQ once weekly x 4 weeks -Month 4+: Inject 1.7 mg SQ once weekly x 4 weeks  Follow up in 3 months.    Phillips Hay PharmD CPP [facility]  [address] [place], [place] [address] [phone]

## 2023-04-07 NOTE — Patient Instructions (Signed)
We will start the prior authorization process to get Wegovy covered by your insurance.   TIPS FOR SUCCESS Write down the reasons why you want to lose weight and post it in a place where you'll see it often. Start small and work your way up. Keep in mind that it takes time to achieve goals, and small steps add up. Any additional movements help to burn calories. Taking the stairs rather than the elevator and parking at the far end of your parking lot are easy ways to start. Brisk walking for at least 30 minutes 4 or more days of the week is an excellent goal to work toward  [employer] WHAT IT MEANS TO FEEL FULL Did you know that it can take 15 minutes or more for your brain to receive the message that you've eaten? That means that, if you eat less food, but consume it slower, you may still feel satisfied. Eating a lot of fruits and vegetables can also help you feel fuller. Eat off of smaller plates so that moderate portions don't seem too small  TITRATION PLAN Will plan to follow the titration plan as below, pending patient is tolerating each dose before increasing to the next. Can slow titration if needed for tolerability.    -Weeks 1-4: Inject 0.25 mg SQ once weekly  -Weeks 5-8: Inject 0.5 mg SQ once weekly  -Weeks 9-12 Inject 1 mg SQ once weekly  -Weeks 13-16: Inject 1.7 SQ once weekly   Follow up in 3 months.  If you have any questions or concerns, please reach out to [place].  Ronesha Heenan/Chris at [phone].  THANK YOU FOR CHOOSING [facility]

## 2023-04-07 NOTE — Telephone Encounter (Signed)
Please do PA for [facility] - Patient has history of CVA

## 2023-04-07 NOTE — Assessment & Plan Note (Signed)
 Patient has not met goal of at least 5% of body weight loss with comprehensive lifestyle modifications alone in the past 3-6 months. Pharmacotherapy is appropriate to pursue as augmentation. Will start Wegovy.   Confirmed patient not pregnant and no personal or family history of medullary thyroid carcinoma (MTC) or Multiple Endocrine Neoplasia syndrome type 2 (MEN 2).   Advised patient on common side effects including nausea, diarrhea, dyspepsia, decreased appetite, and fatigue. Counseled patient on reducing meal size and how to titrate medication to minimize side effects. Patient aware to call if intolerable side effects or if experiencing dehydration, abdominal pain, or dizziness. Patient will adhere to dietary modifications and will target at least 150 minutes of moderate intensity exercise weekly.   Injection technique reviewed at today's visit.    Titration Plan:  Will plan to follow the titration plan as below, pending patient is tolerating each dose before increasing to the next. Can slow titration if needed for tolerability.    -Month 1: Inject 0.25 mg SQ once weekly x 4 weeks -Month 2: Inject 0.5 mg SQ once weekly x 4 weeks -Month 3: Inject 1 mg SQ once weekly x 4 weeks -Month 4: Inject 1.7 mg SQ once weekly x 4 weeks  Follow up in 3 months.

## 2023-04-07 NOTE — Telephone Encounter (Signed)
Pharmacy Patient Advocate Encounter   Received notification from Pt Calls Messages that prior authorization for wegovy is required/requested.   Insurance verification completed.   The patient is insured through [facility] .   Per test claim: PA required; PA submitted to above mentioned insurance via CoverMyMeds Key/confirmation #/EOC [facility] Status is pending

## 2023-04-10 ENCOUNTER — Other Ambulatory Visit (HOSPITAL_COMMUNITY): Payer: Self-pay

## 2023-04-10 NOTE — Telephone Encounter (Signed)
Pharmacy Patient Advocate Encounter  Received notification from [facility] that Prior Authorization for wegovy has been APPROVED from 04/10/23 to 04/14/24. Ran test claim, Copay is $610.43- one month. This test claim was processed through [facility]- copay amounts may vary at other pharmacies due to pharmacy/plan contracts, or as the patient moves through the different stages of their insurance plan.   PA #/Case ID/Reference #: [id number]

## 2023-04-14 ENCOUNTER — Encounter: Payer: Medicare Other | Admitting: Obstetrics and Gynecology

## 2023-04-15 ENCOUNTER — Other Ambulatory Visit (HOSPITAL_COMMUNITY): Payer: Self-pay

## 2023-04-20 ENCOUNTER — Encounter: Payer: Self-pay | Admitting: Family Medicine

## 2023-04-21 ENCOUNTER — Ambulatory Visit: Payer: Medicare Other | Admitting: Family Medicine

## 2023-04-23 ENCOUNTER — Ambulatory Visit: Payer: Medicare Other

## 2023-04-24 ENCOUNTER — Other Ambulatory Visit: Payer: Self-pay | Admitting: Family Medicine

## 2023-04-24 DIAGNOSIS — Z0000 Encounter for general adult medical examination without abnormal findings: Secondary | ICD-10-CM

## 2023-04-25 ENCOUNTER — Other Ambulatory Visit: Payer: Self-pay | Admitting: Family Medicine

## 2023-04-25 ENCOUNTER — Other Ambulatory Visit: Payer: Self-pay

## 2023-04-25 DIAGNOSIS — E785 Hyperlipidemia, unspecified: Secondary | ICD-10-CM

## 2023-04-25 MED ORDER — ALBUTEROL SULFATE HFA 108 (90 BASE) MCG/ACT IN AERS: INHALATION_SPRAY | RESPIRATORY_TRACT | 0 refills | Status: DC

## 2023-04-25 MED ORDER — ESOMEPRAZOLE MAGNESIUM 40 MG PO CPDR: 40.0000 mg | DELAYED_RELEASE_CAPSULE | Freq: Every day | ORAL | 0 refills | Status: DC

## 2023-04-25 MED ORDER — ATORVASTATIN CALCIUM 20 MG PO TABS: 20.0000 mg | ORAL_TABLET | Freq: Every day | ORAL | 0 refills | Status: DC

## 2023-04-25 NOTE — Telephone Encounter (Signed)
 Copied from CRM #[id number]. Topic: Clinical - Medication Refill >> Apr 25, 2023 10:47 AM Mercedes MATSU wrote: Most Recent Primary Care Visit:  Provider: COLE BRISKER  Department: [facility]  Visit Type: NURSE VISIT  Date: 02/26/2023  Medication: ***  Has the patient contacted their pharmacy?  (Agent: If no, request that the patient contact the pharmacy for the refill. If patient does not wish to contact the pharmacy document the reason why and proceed with request.) (Agent: If yes, when and what did the pharmacy advise?)  Is this the correct pharmacy for this prescription?  If no, delete pharmacy and type the correct one.  This is the patient's preferred pharmacy:  [id number] - [place], [place] - [address] [place] [address] Phone: [phone] Fax: [phone]   Has the prescription been filled recently?   Is the patient out of the medication?   Has the patient been seen for an appointment in the last year OR does the patient have an upcoming appointment?   Can we respond through MyChart?   Agent: Please be advised that Rx refills may take up to 3 business days. We ask that you follow-up with your pharmacy.

## 2023-04-30 ENCOUNTER — Encounter: Payer: Self-pay | Admitting: Obstetrics and Gynecology

## 2023-04-30 ENCOUNTER — Other Ambulatory Visit (HOSPITAL_COMMUNITY)
Admission: RE | Admit: 2023-04-30 | Discharge: 2023-04-30 | Disposition: A | Discharge status: Home / Self Care | Payer: Medicare Other | Source: Ambulatory Visit | Attending: Obstetrics and Gynecology | Admitting: Obstetrics and Gynecology

## 2023-04-30 ENCOUNTER — Ambulatory Visit (INDEPENDENT_AMBULATORY_CARE_PROVIDER_SITE_OTHER): Payer: Medicare Other | Admitting: Obstetrics and Gynecology

## 2023-04-30 VITALS — BP 138/76 | HR 71

## 2023-04-30 DIAGNOSIS — Z124 Encounter for screening for malignant neoplasm of cervix: Secondary | ICD-10-CM | POA: Insufficient documentation

## 2023-04-30 DIAGNOSIS — N950 Postmenopausal bleeding: Secondary | ICD-10-CM | POA: Insufficient documentation

## 2023-04-30 DIAGNOSIS — M8589 Other specified disorders of bone density and structure, multiple sites: Secondary | ICD-10-CM | POA: Diagnosis not present

## 2023-04-30 DIAGNOSIS — B009 Herpesviral infection, unspecified: Secondary | ICD-10-CM | POA: Diagnosis not present

## 2023-04-30 DIAGNOSIS — Z9189 Other specified personal risk factors, not elsewhere classified: Secondary | ICD-10-CM

## 2023-04-30 DIAGNOSIS — Z01419 Encounter for gynecological examination (general) (routine) without abnormal findings: Secondary | ICD-10-CM | POA: Insufficient documentation

## 2023-04-30 NOTE — Assessment & Plan Note (Signed)
 Continue vitamin D+Calcium Encouraged weight based exercise DXA due, ordered

## 2023-04-30 NOTE — Patient Instructions (Signed)

## 2023-04-30 NOTE — Progress Notes (Signed)
 68 y.o. [id number] postmenopausal female with osteopenia, premature menopause in her 30s, prior CVA here for annual exam. Married.  Retired Runner, broadcasting/film/video.  Pt noticed some spotting with wiping today. Did wash more vigorously and shave today. So wonders if it is related to this.  Denies bleeding issues previously.  Denies pelvic cramping, urinary or vaginal issues.  Postmenopausal bleeding: As noted above Pelvic discharge or pain: None Breast mass, nipple discharge or skin changes : None Last PAP:     Component Value Date/Time   DIAGPAP  04/03/2022 1543    - Negative for intraepithelial lesion or malignancy (NILM)   DIAGPAP  04/02/2021 0939    - Negative for intraepithelial lesion or malignancy (NILM)   ADEQPAP  04/03/2022 1543    Satisfactory for evaluation; transformation zone component PRESENT.   ADEQPAP  04/02/2021 0939    Satisfactory for evaluation; transformation zone component PRESENT.   Last mammogram: 06/07/2022 BI-RADS 1, density B  Last colonoscopy: 02/01/2021, T-score -1.7 Last DXA: 05/23/2020 Sexually active: Yes Exercising: no Smoker: no  GYN HISTORY: Premature menopause in her 30s  OB History  Gravida Para Term Preterm AB Living  4 3  1 1 3   SAB IAB Ectopic Multiple Live Births    0      # Outcome Date GA Lbr Len/2nd Weight Sex Type Anes PTL Lv  4 AB           3 Para           2 Para           1 Preterm             Past Medical History:  Diagnosis Date   Allergy    Arthritis    Asthma    Back pain    Benign positional vertigo    Bilateral swelling of feet    Bronchitis    Chicken pox    Colon polyps    Constipation    Cough 10/09/2020   COVID-19 10/09/2020   Diverticulitis    Early menopause occurring in patient age younger than 45 years    Fibromyalgia    GERD (gastroesophageal reflux disease)    Gluten intolerance    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 Still unclear what is cuasing her pain. May be nerve related. May benefit from starting on gabapentin. Start gabapentin 300 mg TID. F/u 4 weeks   Night sweats    Palpitations    Recurrent upper respiratory infection (URI)    Right arm weakness 09/28/2015   Last Assessment & Plan:  Formatting of this note might be different from the original. Referral to PT today. 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    Vertigo 08/11/2015   Last Assessment & Plan:  Formatting of this note might be different from the original. Concern given gradual onset over the last week and a half in addition to neck pain. Patient needs neuroimaging to rule out dissecting vertebral aneurysm. Patient advised to go to emergency room. She declined ambulance transport   Vitamin B 12 deficiency    Vitamin D   deficiency     Past Surgical History:  Procedure Laterality Date   CARPAL TUNNEL RELEASE     [facility] [place] repeat recommended   COLONOSCOPY W/ POLYPECTOMY  05/26/2018   x5 polyps   KNEE ARTHROSCOPY Bilateral 2013   meniscus  x2   NECK SURGERY  2018   disc fusion   UPPER GASTROINTESTINAL ENDOSCOPY      Current Outpatient Medications on File Prior to Visit  Medication Sig Dispense Refill   albuterol  (VENTOLIN  HFA) 108 (90 Base) MCG/ACT inhaler TAKE 2 PUFFS BY MOUTH EVERY 6 HOURS AS NEEDED FOR WHEEZE OR SHORTNESS OF BREATH 8.5 each 0   aspirin EC 81 MG tablet Take 81 mg by mouth daily.     atorvastatin  (LIPITOR) 20 MG tablet Take 1 tablet (20 mg total) by mouth daily. 30 tablet 0   cetirizine (ZYRTEC) 5 MG tablet Take 5 mg by mouth daily as needed for allergies.     Cholecalciferol 25 MCG (1000 UT) capsule Take 1,000 Units by  mouth daily.     cyanocobalamin 1000 MCG tablet Take 500 mcg by mouth daily.     esomeprazole  (NEXIUM ) 40 MG capsule Take 1 capsule (40 mg total) by mouth daily at 12 noon. 30 capsule 0   fluticasone  (FLONASE ) 50 MCG/ACT nasal spray Place 2 sprays into both nostrils daily.     glucosamine-chondroitin 500-400 MG tablet Take 1 tablet by mouth 3 (three) times daily.     loratadine (CLARITIN) 10 MG tablet Take 10 mg by mouth daily.     Magnesium  250 MG TABS Take 1 tablet by mouth daily.     Multiple Vitamins-Minerals (ALIVE WOMENS 50+ PO) Take by mouth daily.     polyethylene glycol powder (GLYCOLAX/MIRALAX) 17 GM/SCOOP powder Take by mouth.     Probiotic Product (PROBIOTIC ADVANCED) CAPS daily.      No current facility-administered medications on file prior to visit.    Social History   Socioeconomic History   Marital status: Married    Spouse name: Sucely Ziegler   Number of children: 3   Years of education: Not on file   Highest education level: Bachelor's degree (e.g., BA, AB, BS)  Occupational History   Occupation: retired Runner, broadcasting/film/video  Tobacco Use   Smoking status: Never    Passive exposure: Never   Smokeless tobacco: Never  Vaping Use   Vaping status: Never Used  Substance and Sexual Activity   Alcohol use: Yes    Alcohol/week: 1.0 standard drink of alcohol    Types: 1 Glasses of wine per week   Drug use: Never   Sexual activity: Yes    Partners: Male    Birth control/protection: Post-menopausal    Comment: married,intercourse age 74, less than 5 sexual partners,des neg  Other Topics Concern   Not on file  Social History Narrative   Married second family - 8 kids 14 grandkids blended   Probation officer, retired Runner, broadcasting/film/video   Caffeine 2/day   Smoker - never   EtOH - rare   No drugs   Wears her seatbelt, smoke alarm at home.   Feels safe in her relationships.   Social Drivers of Corporate investment banker Strain: Low Risk  (04/03/2022)   Overall Financial Resource Strain  (CARDIA)    Difficulty of Paying Living Expenses: Not hard at all  Food Insecurity: No Food Insecurity (04/03/2022)   Hunger Vital Sign    Worried About Running Out  of Food in the Last Year: Never true    Ran Out of Food in the Last Year: Never true  Transportation Needs: No Transportation Needs (04/03/2022)   PRAPARE - Administrator, Civil Service (Medical): No    Lack of Transportation (Non-Medical): No  Physical Activity: Inactive (04/03/2022)   Exercise Vital Sign    Days of Exercise per Week: 0 days    Minutes of Exercise per Session: 0 min  Stress: No Stress Concern Present (04/03/2022)   [employer] of Occupational Health - Occupational Stress Questionnaire    Feeling of Stress : Not at all  Social Connections: Unknown (01/30/2023)   [facility]   Social Network    Social Network: Not on file  Intimate Partner Violence: Unknown (01/30/2023)   Received from [facility]   HITS    Physically Hurt: Not on file    Insult or Talk Down To: Not on file    Threaten Physical Harm: Not on file    Scream or Curse: Not on file    Family History  Problem Relation Age of Onset   Depression Mother    Heart disease Mother    Early death Mother 55   Hypertension Mother    Thyroid  disease Mother    Anxiety disorder Mother    Obesity Mother    Arthritis Father    Heart disease Father    Hypertension Father    Stroke Father    Depression Sister 47   Early death Sister    Drug abuse Sister    Lung cancer Sister    Mental illness Sister    COPD Sister    Diabetes Maternal Grandmother    Early death Maternal Grandmother    Early death Maternal Grandfather    Heart disease Maternal Grandfather    Breast cancer Cousin    Barrett's esophagus Son    Colon cancer Neg Hx    Esophageal cancer Neg Hx    Stomach cancer Neg Hx    Pancreatic cancer Neg Hx    Rectal cancer Neg Hx     Allergies  Allergen Reactions   Hydromorphone Other (See  Comments)    HYPOTENSION   Meperidine Other (See Comments)    "BP drops"   Other Other (See Comments)    Pt thinks she has hx of allergic reaction   States she does not know the reaction   Sulfa Antibiotics Other (See Comments)    States she does not know the reaction       PE Today's Vitals   04/30/23 1019  BP: 138/76  Pulse: 71  SpO2: 97%   There is no height or weight on file to calculate BMI.  Physical Exam Vitals reviewed. Exam conducted with a chaperone present.  Constitutional:      General: She is not in acute distress.    Appearance: Normal appearance.  HENT:     Head: Normocephalic and atraumatic.     Nose: Nose normal.  Eyes:     Extraocular Movements: Extraocular movements intact.     Conjunctiva/sclera: Conjunctivae normal.  Neck:     Thyroid : No thyroid  mass, thyromegaly or thyroid  tenderness.  Pulmonary:     Effort: Pulmonary effort is normal.  Chest:     Chest wall: No mass or tenderness.  Breasts:    Right: Normal. No swelling, mass, nipple discharge, skin change or tenderness.     Left: Normal. No swelling, mass, nipple discharge, skin change  or tenderness.  Abdominal:     General: There is no distension.     Palpations: Abdomen is soft.     Tenderness: There is no abdominal tenderness.  Genitourinary:    General: Normal vulva.     Exam position: Lithotomy position.     Urethra: No prolapse.     Vagina: Normal. No vaginal discharge or bleeding.     Cervix: Normal. No lesion.     Uterus: Normal. Not enlarged and not tender.      Adnexa: Right adnexa normal and left adnexa normal.       Comments: Small abrasion noted along the right posterior fourchette. Musculoskeletal:        General: Normal range of motion.     Cervical back: Normal range of motion.  Lymphadenopathy:     Upper Body:     Right upper body: No axillary adenopathy.     Left upper body: No axillary adenopathy.     Lower Body: No right inguinal adenopathy. No left inguinal  adenopathy.  Skin:    General: Skin is warm and dry.  Neurological:     General: No focal deficit present.     Mental Status: She is alert.  Psychiatric:        Mood and Affect: Mood normal.        Behavior: Behavior normal.       Assessment and Plan:        Well woman exam with routine gynecological exam Assessment & Plan: Cervical cancer screening performed according to ASCCP guidelines. Encouraged annual mammogram screening Colonoscopy UTD DXA due Labs and immunizations with her primary Encouraged safe sexual practices as indicated Encouraged healthy lifestyle practices with diet and exercise For patients under 50-70yo, I recommend 1200mg  calcium  daily and 600IU of vitamin D  daily.   Cervical cancer screening -     Cytology - PAP  PMB (postmenopausal bleeding) Appears related to vaginal tear with evidence on exam. Will check Pap smear given new symptom. Patient to monitor symptoms.  If bleeding persist beyond 1 week, then she should return to office for an evaluation via ultrasound. -     Cytology - PAP  Osteopenia of multiple sites Assessment & Plan: Continue vitamin D +Calcium  Encouraged weight based exercise DXA due, ordered   Orders: -     DG Bone Density; Future    Romaine Closs, MD

## 2023-04-30 NOTE — Assessment & Plan Note (Signed)
 Cervical cancer screening performed according to ASCCP guidelines. Encouraged annual mammogram screening Colonoscopy UTD DXA due Labs and immunizations with her primary Encouraged safe sexual practices as indicated Encouraged healthy lifestyle practices with diet and exercise For patients under 50-70yo, I recommend 1200mg  calcium daily and 600IU of vitamin D daily.

## 2023-05-01 ENCOUNTER — Telehealth: Payer: Self-pay | Admitting: Internal Medicine

## 2023-05-01 DIAGNOSIS — Z8673 Personal history of transient ischemic attack (TIA), and cerebral infarction without residual deficits: Secondary | ICD-10-CM

## 2023-05-01 DIAGNOSIS — Z6830 Body mass index (BMI) 30.0-30.9, adult: Secondary | ICD-10-CM | POA: Insufficient documentation

## 2023-05-01 MED ORDER — WEGOVY 0.25 MG/0.5ML ~~LOC~~ SOAJ: 0.2500 mg | SUBCUTANEOUS | 0 refills | Status: DC

## 2023-05-01 NOTE — Telephone Encounter (Signed)
Do not see any messages but will send Wegovy 0.25mg  to her pharmacy

## 2023-05-01 NOTE — Telephone Encounter (Signed)
Called pt she state she received a call yesterday at 3:40 from our office. I looked over pt's chart, I do not see the call documented. Pt states the call was regarding Wegovy. Will forward to pharm D to address further.

## 2023-05-01 NOTE — Telephone Encounter (Signed)
Pt states that she spoke with someone yesterday regarding approval of Wegovy and sending prescription to pharmacy. Requesting cb due to not seeing it on med list. Status update

## 2023-05-02 LAB — CYTOLOGY - PAP: Diagnosis: NEGATIVE

## 2023-05-05 ENCOUNTER — Encounter: Payer: Self-pay | Admitting: Obstetrics and Gynecology

## 2023-05-05 ENCOUNTER — Encounter: Payer: Self-pay | Admitting: Internal Medicine

## 2023-05-06 ENCOUNTER — Other Ambulatory Visit (HOSPITAL_COMMUNITY): Payer: Self-pay

## 2023-05-12 ENCOUNTER — Encounter: Payer: Self-pay | Admitting: Physician Assistant

## 2023-05-20 ENCOUNTER — Other Ambulatory Visit: Payer: Self-pay

## 2023-05-20 DIAGNOSIS — E785 Hyperlipidemia, unspecified: Secondary | ICD-10-CM

## 2023-05-20 NOTE — Addendum Note (Signed)
Addended by: Filomena Jungling on: 05/20/2023 02:15 PM   Modules accepted: Orders

## 2023-05-21 ENCOUNTER — Encounter: Payer: Self-pay | Admitting: Internal Medicine

## 2023-05-21 ENCOUNTER — Encounter: Payer: Self-pay | Admitting: Obstetrics and Gynecology

## 2023-05-21 ENCOUNTER — Other Ambulatory Visit: Payer: Self-pay

## 2023-05-21 DIAGNOSIS — M8589 Other specified disorders of bone density and structure, multiple sites: Secondary | ICD-10-CM

## 2023-05-22 ENCOUNTER — Encounter: Payer: Self-pay | Admitting: Obstetrics and Gynecology

## 2023-05-22 ENCOUNTER — Encounter: Payer: Self-pay | Admitting: Family Medicine

## 2023-05-26 ENCOUNTER — Ambulatory Visit (HOSPITAL_BASED_OUTPATIENT_CLINIC_OR_DEPARTMENT_OTHER)
Admission: RE | Admit: 2023-05-26 | Discharge: 2023-05-26 | Disposition: A | Discharge status: Home / Self Care | Payer: Medicare Other | Source: Ambulatory Visit | Attending: Obstetrics and Gynecology | Admitting: Obstetrics and Gynecology

## 2023-05-26 DIAGNOSIS — M8589 Other specified disorders of bone density and structure, multiple sites: Secondary | ICD-10-CM | POA: Insufficient documentation

## 2023-05-28 ENCOUNTER — Encounter: Payer: Self-pay | Admitting: Obstetrics and Gynecology

## 2023-06-11 ENCOUNTER — Encounter: Payer: Self-pay | Admitting: Family Medicine

## 2023-06-11 ENCOUNTER — Ambulatory Visit (INDEPENDENT_AMBULATORY_CARE_PROVIDER_SITE_OTHER): Payer: Medicare Other | Admitting: Family Medicine

## 2023-06-11 ENCOUNTER — Ambulatory Visit: Payer: Medicare Other | Admitting: Family Medicine

## 2023-06-11 ENCOUNTER — Ambulatory Visit
Admission: RE | Admit: 2023-06-11 | Discharge: 2023-06-11 | Disposition: A | Discharge status: Home / Self Care | Payer: Medicare Other | Source: Ambulatory Visit | Attending: Family Medicine | Admitting: Family Medicine

## 2023-06-11 VITALS — BP 128/74 | HR 70 | Temp 97.9°F | Wt 186.0 lb

## 2023-06-11 DIAGNOSIS — R29898 Other symptoms and signs involving the musculoskeletal system: Secondary | ICD-10-CM

## 2023-06-11 DIAGNOSIS — E785 Hyperlipidemia, unspecified: Secondary | ICD-10-CM | POA: Diagnosis not present

## 2023-06-11 DIAGNOSIS — Z0000 Encounter for general adult medical examination without abnormal findings: Secondary | ICD-10-CM

## 2023-06-11 LAB — COMPREHENSIVE METABOLIC PANEL
ALT: 23 U/L (ref 0–35)
AST: 19 U/L (ref 0–37)
Albumin: 4.2 g/dL (ref 3.5–5.2)
Alkaline Phosphatase: 88 U/L (ref 39–117)
BUN: 14 mg/dL (ref 6–23)
CO2: 29 meq/L (ref 19–32)
Calcium: 9 mg/dL (ref 8.4–10.5)
Chloride: 104 meq/L (ref 96–112)
Creatinine, Ser: 0.68 mg/dL (ref 0.40–1.20)
GFR: 89.49 mL/min (ref >60.00)
Glucose, Bld: 102 mg/dL — ABNORMAL HIGH (ref 70–99)
Potassium: 4.2 meq/L (ref 3.5–5.1)
Sodium: 141 meq/L (ref 135–145)
Total Bilirubin: 1 mg/dL (ref 0.2–1.2)
Total Protein: 6.5 g/dL (ref 6.0–8.3)

## 2023-06-11 LAB — CBC
HCT: 43.9 % (ref 36.0–46.0)
Hemoglobin: 15.1 g/dL — ABNORMAL HIGH (ref 12.0–15.0)
MCHC: 34.3 g/dL (ref 30.0–36.0)
MCV: 101.9 fL — ABNORMAL HIGH (ref 78.0–100.0)
Platelets: 215 10*3/uL (ref 150.0–400.0)
RBC: 4.31 Mil/uL (ref 3.87–5.11)
RDW: 12.6 % (ref 11.5–15.5)
WBC: 5.1 10*3/uL (ref 4.0–10.5)

## 2023-06-11 LAB — TSH: TSH: 1.39 u[IU]/mL (ref 0.35–5.50)

## 2023-06-11 LAB — LIPID PANEL
Cholesterol: 110 mg/dL (ref 0–200)
HDL: 35.9 mg/dL — ABNORMAL LOW (ref >39.00)
LDL Cholesterol: 48 mg/dL (ref 0–99)
NonHDL: 73.97
Total CHOL/HDL Ratio: 3
Triglycerides: 132 mg/dL (ref 0.0–149.0)
VLDL: 26.4 mg/dL (ref 0.0–40.0)

## 2023-06-11 MED ORDER — ATORVASTATIN CALCIUM 20 MG PO TABS: 20.0000 mg | ORAL_TABLET | Freq: Every day | ORAL | 3 refills | Status: AC

## 2023-06-11 MED ORDER — ESOMEPRAZOLE MAGNESIUM 40 MG PO CPDR: 40.0000 mg | DELAYED_RELEASE_CAPSULE | Freq: Every day | ORAL | 3 refills | Status: AC

## 2023-06-11 NOTE — Progress Notes (Signed)
 Patient ID: Kayla Rosales, female  DOB: [birth year], 68 y.o.   MRN: [id number] Patient Care Team    Relationship Specialty Notifications Start End  Natalia Leatherwood, DO PCP - General Family Medicine  01/15/18   Parke Poisson, MD PCP - Cardiology Cardiology Admissions 07/09/19   Iva Boop, MD Consulting Physician Gastroenterology  01/19/18   Rosalyn Gess, MD Consulting Physician Obstetrics and Gynecology  04/30/23     Chief Complaint  Patient presents with   Hyperlipidemia    Pt is fasting.     Subjective: Kayla Rosales is a 68 y.o.  Female  present for chronic medical conditions. .  All past medical history, surgical history, allergies, family history, immunizations, medications and social history were updated in the electronic medical record today. All recent labs, ED visits and hospitalizations within the last year were reviewed.  Hyperlipidemia/h/o stroke/murmur:  Patient suffered from a stroke in 2017.  Since that time she has been on atorvastatin 20 mg qd. She is compliant with medication.   Patient reports she has noticed some mild leg "weakness ".  She states this is occurred for over a year.  She points to the upper posterior thigh as the location.  She reports she is not as active as she has been in the past.  She has gained some weight.  She is helping taking care of her grandchildren and is in the car frequently driving between locations.  She denies having any falls.  She denies any back pain or numbness/tingling of her extremities.  Weakness is bilateral and intermittent.      06/11/2023    8:39 AM 04/30/2023   10:16 AM 04/03/2022    8:25 AM 01/21/2022    8:28 AM 03/19/2021    9:31 AM  Depression screen PHQ 2/9  Decreased Interest 0 0 0 3 0  Down, Depressed, Hopeless 0 0 0 1 0  PHQ - 2 Score 0 0 0 4 0  Altered sleeping 0   1 1  Tired, decreased energy 2   3 1   Change in appetite 2   2 0  Feeling bad or failure about yourself  0   0 0  Trouble  concentrating 0   1 0  Moving slowly or fidgety/restless 0   0 0  Suicidal thoughts 0   0 0  PHQ-9 Score 4   11 2   Difficult doing work/chores Not difficult at all   Somewhat difficult       06/11/2023    8:39 AM 04/30/2023   10:16 AM 04/03/2022    8:25 AM 01/21/2022    8:28 AM 03/19/2021    9:31 AM  Depression screen PHQ 2/9  Decreased Interest 0 0 0 3 0  Down, Depressed, Hopeless 0 0 0 1 0  PHQ - 2 Score 0 0 0 4 0  Altered sleeping 0   1 1  Tired, decreased energy 2   3 1   Change in appetite 2   2 0  Feeling bad or failure about yourself  0   0 0  Trouble concentrating 0   1 0  Moving slowly or fidgety/restless 0   0 0  Suicidal thoughts 0   0 0  PHQ-9 Score 4   11 2   Difficult doing work/chores Not difficult at all   Somewhat difficult       06/11/2023    8:40 AM 03/19/2021    9:31 AM  GAD  7 : Generalized Anxiety Score  Nervous, Anxious, on Edge 0 0  Control/stop worrying 0 0  Worry too much - different things 0 1  Trouble relaxing 0 0  Restless 0 0  Easily annoyed or irritable 0 0  Afraid - awful might happen 0 0  Total GAD 7 Score 0 1  Anxiety Difficulty Not difficult at all      Immunization History  Administered Date(s) Administered   Fluad Quad(high Dose 65+) 03/27/2020, 03/19/2021, 03/29/2022   Fluad Trivalent(High Dose 65+) 02/26/2023   Influenza Inj Mdck Quad Pf 03/27/2017   Influenza, High Dose Seasonal PF 01/15/2018   Influenza, Quadrivalent, Recombinant, Inj, Pf 02/16/2019   Influenza, Seasonal, Injecte, Preservative Fre 03/27/2017   Influenza,inj,Quad PF,6+ Mos 02/16/2019   Influenza-Unspecified 01/21/2016, 02/16/2019   Moderna Covid-19 Vaccine Bivalent Booster 37yrs & up 03/30/2021   Moderna Sars-Covid-2 Vaccination 06/10/2019, 07/09/2019, 03/02/2020   PNEUMOCOCCAL CONJUGATE-20 03/29/2022   Pneumococcal Conjugate-13 04/03/2020   Pneumococcal Polysaccharide-23 08/13/2015   Tdap 12/30/2011, 01/21/2016   Zoster Recombinant(Shingrix) 02/16/2019,  04/22/2019    Past Medical History:  Diagnosis Date   Allergy    Arthritis    Asthma    Back pain    Benign positional vertigo    Bilateral swelling of feet    Bronchitis    Chicken pox    Colon polyps    Constipation    Cough 10/09/2020   COVID-19 10/09/2020   Diverticulitis    Early menopause occurring in patient age younger than 45 years    Fibromyalgia    GERD (gastroesophageal reflux disease)    Gluten intolerance    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 Still unclear what is cuasing her pain. May be nerve related. May benefit from starting on gabapentin. Start gabapentin 300 mg TID. F/u 4 weeks   Night sweats    Osteopenia    Palpitations    Recurrent upper respiratory infection (URI)    Right arm weakness 09/28/2015   Last Assessment & Plan:  Formatting of this note might be different from the original. Referral to PT today. 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    Vertigo 08/11/2015   Last Assessment & Plan:  Formatting of this note might be different from the original. Concern given gradual onset over the last week and a half in addition to neck pain. Patient needs neuroimaging to rule out dissecting vertebral aneurysm. Patient advised to go to emergency room. She declined ambulance transport   Vitamin B 12 deficiency    Vitamin D deficiency    Allergies  Allergen Reactions   Hydromorphone Other (See Comments)    HYPOTENSION   Meperidine Other (See Comments)    "BP drops"   Other Other (See Comments)    Pt thinks she has hx of allergic reaction   States she does not know the reaction   Sulfa Antibiotics Other (See  Comments)    States she does not know the reaction    Past Surgical History:  Procedure Laterality Date   CARPAL TUNNEL RELEASE     COLONOSCOPY  2015   Dr.  [name]- [place], [place]; 5 yr repeat recommended   COLONOSCOPY W/ POLYPECTOMY  05/26/2018   x5 polyps   KNEE ARTHROSCOPY Bilateral 2013   meniscus  x2   NECK SURGERY  2018   disc fusion   UPPER GASTROINTESTINAL ENDOSCOPY     Family History  Problem Relation Age of Onset   Depression Mother    Heart disease Mother    Early death Mother 86   Hypertension Mother    Thyroid disease Mother    Anxiety disorder Mother    Obesity Mother    Arthritis Father    Heart disease Father    Hypertension Father    Stroke Father    Depression Sister 75   Early death Sister    Drug abuse Sister    Lung cancer Sister    Mental illness Sister    COPD Sister    Diabetes Maternal Grandmother    Early death Maternal Grandmother    Early death Maternal Grandfather    Heart disease Maternal Grandfather    Breast cancer Cousin    Barrett's esophagus Son    Colon cancer Neg Hx    Esophageal cancer Neg Hx    Stomach cancer Neg Hx    Pancreatic cancer Neg Hx    Rectal cancer Neg Hx    Social History   Social History Narrative   Married second family - 8 kids 14 grandkids blended   Probation officer, retired Runner, broadcasting/film/video   Caffeine 2/day   Smoker - never   EtOH - rare   No drugs   Wears her seatbelt, smoke alarm at home.   Feels safe in her relationships.    Allergies as of 06/11/2023       Reactions   Hydromorphone Other (See Comments)   HYPOTENSION   Meperidine Other (See Comments)   "BP drops"   Other Other (See Comments)   Pt thinks she has hx of allergic reaction  States she does not know the reaction   Sulfa Antibiotics Other (See Comments)   States she does not know the reaction        Medication List        Accurate as of June 11, 2023 12:12 PM. If you have any questions, ask your nurse or doctor.           STOP taking these medications    loratadine 10 MG tablet Commonly known as: CLARITIN Stopped by: Felix Pacini   polyethylene glycol powder 17 GM/SCOOP powder Commonly known as: GLYCOLAX/MIRALAX Stopped by: Felix Pacini   Wegovy 0.25 MG/0.5ML Soaj Generic drug: Semaglutide-Weight Management Stopped by: Felix Pacini       TAKE these medications    albuterol 108 (90 Base) MCG/ACT inhaler Commonly known as: VENTOLIN HFA TAKE 2 PUFFS BY MOUTH EVERY 6 HOURS AS NEEDED FOR WHEEZE OR SHORTNESS OF BREATH   ALIVE WOMENS 50+ PO Take by mouth daily.   aspirin EC 81 MG tablet Take 81 mg by mouth daily.   atorvastatin 20 MG tablet Commonly known as: LIPITOR Take 1 tablet (20 mg total) by mouth daily.   cetirizine 5 MG tablet Commonly known as: ZYRTEC Take 5 mg by mouth daily as needed for allergies.   Cholecalciferol 25 MCG (1000 UT) capsule Take 1,000 Units by mouth daily.   cyanocobalamin 1000 MCG tablet Take 500 mcg by mouth daily.   esomeprazole 40 MG capsule Commonly known as: NEXIUM Take 1 capsule (40 mg total) by mouth daily at 12 noon.  fluticasone 50 MCG/ACT nasal spray Commonly known as: FLONASE Place 2 sprays into both nostrils daily.   glucosamine-chondroitin 500-400 MG tablet Take 1 tablet by mouth 3 (three) times daily.   Magnesium 250 MG Tabs Take 1 tablet by mouth daily.   Probiotic Advanced Caps daily.        All past medical history, surgical history, allergies, family history, immunizations andmedications were updated in the EMR today and reviewed under the history and medication portions of their EMR.      ROS: 14 pt review of systems performed and negative (unless mentioned in an HPI)  Objective: BP 128/74   Pulse 70   Temp 97.9 F (36.6 C)   Wt 186 lb (84.4 kg)   SpO2 97%   BMI 30.95 kg/m  Physical Exam Vitals and nursing note reviewed.  Constitutional:      General: She is not in acute distress.    Appearance: Normal  appearance. She is not ill-appearing or toxic-appearing.  HENT:     Head: Normocephalic and atraumatic.     Right Ear: Tympanic membrane, ear canal and external ear normal. There is no impacted cerumen.     Left Ear: Tympanic membrane, ear canal and external ear normal. There is no impacted cerumen.     Nose: No congestion or rhinorrhea.     Mouth/Throat:     Mouth: Mucous membranes are moist.     Pharynx: Oropharynx is clear. No oropharyngeal exudate or posterior oropharyngeal erythema.  Eyes:     General: No scleral icterus.       Right eye: No discharge.        Left eye: No discharge.     Extraocular Movements: Extraocular movements intact.     Conjunctiva/sclera: Conjunctivae normal.     Pupils: Pupils are equal, round, and reactive to light.  Cardiovascular:     Rate and Rhythm: Normal rate and regular rhythm.     Pulses: Normal pulses.     Heart sounds: Normal heart sounds. No murmur heard.    No friction rub. No gallop.  Pulmonary:     Effort: Pulmonary effort is normal. No respiratory distress.     Breath sounds: Normal breath sounds. No stridor. No wheezing, rhonchi or rales.  Chest:     Chest wall: No tenderness.  Abdominal:     General: Abdomen is flat. Bowel sounds are normal. There is no distension.     Palpations: Abdomen is soft. There is no mass.     Tenderness: There is no abdominal tenderness. There is no right CVA tenderness, left CVA tenderness, guarding or rebound.     Hernia: No hernia is present.  Musculoskeletal:        General: No swelling, tenderness or deformity. Normal range of motion.     Cervical back: Normal range of motion and neck supple. No rigidity or tenderness.     Right lower leg: No edema.     Left lower leg: No edema.  Lymphadenopathy:     Cervical: No cervical adenopathy.  Skin:    General: Skin is warm and dry.     Coloration: Skin is not jaundiced or pale.     Findings: No bruising, erythema, lesion or rash.  Neurological:      General: No focal deficit present.     Mental Status: She is alert and oriented to person, place, and time. Mental status is at baseline.     Cranial Nerves: No cranial nerve deficit.  Sensory: No sensory deficit.     Motor: No weakness.     Coordination: Coordination normal.     Gait: Gait normal.     Deep Tendon Reflexes: Reflexes normal.  Psychiatric:        Mood and Affect: Mood normal.        Behavior: Behavior normal.        Thought Content: Thought content normal.        Judgment: Judgment normal.    No results found.  Assessment/plan: Kayla Rosales is a 68 y.o. female present for [facility] Hyperlipidemia LDL goal <100/ History of stroke/obesity Continue  atrovastatin 20 mg qd. Diet and exercise.  Cbc, cmp, tsh, lipids collected  Gastroesophageal reflux disease, unspecified whether esophagitis present/long-term PPI therapy Stable Continue nexium. B12, vitamin D and magnesium UTD- check every 2-3 yrs  History of colonic polyp Up to date- due 01/2024  Osteopenia of multiple sites/vit d/d/o bone/Disorder of bone - Vitamin D (25 hydroxy) Completed 2025  Bilateral leg weakness: Present for over a year.  Intermittent.  Posterior upper thigh We discussed options today including workup for autoimmune/inflammatory disorder and she would like to hold on this for now.  She understands to make an appointment if she decides to move forward with a full evaluation and workup. She is taking vitamin D and her B12 daily. She is agreeable to referral to physical therapy. Encouraged her to follow-up in 4 weeks if not seeing improvement, sooner if worsening.  Return in about 1 year (around 06/11/2024) for Routine chronic condition follow-up.   Orders Placed This Encounter  Procedures   CBC   Comprehensive metabolic panel   Lipid panel   TSH   Ambulatory referral to Physical Therapy   Meds ordered this encounter  Medications   atorvastatin (LIPITOR) 20 MG tablet    Sig: Take 1  tablet (20 mg total) by mouth daily.    Dispense:  90 tablet    Refill:  3   esomeprazole (NEXIUM) 40 MG capsule    Sig: Take 1 capsule (40 mg total) by mouth daily at 12 noon.    Dispense:  90 capsule    Refill:  3    Referral Orders         Ambulatory referral to Physical Therapy        Electronically signed by: Felix Pacini, DO [facility]

## 2023-06-11 NOTE — Patient Instructions (Addendum)
 Return in about 1 year (around 06/11/2024) for Routine chronic condition follow-up.   If not feeling improvement in your leg/muscle discomfort or it is worsening, follow up in 6 weeks.      Great to see you today.  I have refilled the medication(s) we provide.   If labs were collected or images ordered, we will inform you of  results once we have received them and reviewed. We will contact you either by echart message, or telephone call.  Please give ample time to the testing facility, and our office to run,  receive and review results. Please do not call inquiring of results, even if you can see them in your chart. We will contact you as soon as we are able. If it has been over 1 week since the test was completed, and you have not yet heard from [place], then please call us.    - echart message- for normal results that have been seen by the patient already.   - telephone call: abnormal results or if patient has not viewed results in their echart.  If a referral to a specialist was entered for you, please call us in 2 weeks if you have not heard from the specialist office to schedule.

## 2023-06-12 ENCOUNTER — Encounter: Payer: Self-pay | Admitting: Family Medicine

## 2023-06-12 NOTE — Addendum Note (Signed)
 Addended by: Felix Pacini A on: 06/12/2023 09:16 AM   Modules accepted: Orders

## 2023-06-12 NOTE — Telephone Encounter (Signed)
 Please inform pt I changed the lx of her Pt order to [place] per her request

## 2023-06-13 ENCOUNTER — Encounter: Payer: Self-pay | Admitting: Family Medicine

## 2023-06-13 NOTE — Telephone Encounter (Signed)
 Please advise pt, if she would like to pursue work up surrounded elevated glucose we can certainly see her and check A1c.  Her glucose level of 102, is not in the diabetic range, even if she fasted for 9 hours. So I am not overly concerned for diabetes for her, but we can always discuss further if she desires.

## 2023-06-16 ENCOUNTER — Telehealth: Payer: Self-pay

## 2023-06-16 NOTE — Telephone Encounter (Signed)
 Copied from CRM [id number]. Topic: Clinical - Request for Lab/Test Order >> Jun 16, 2023  1:52 PM Kayla Rosales wrote: Reason for CRM: Patient would like to have A1c ordered to have blood work completed  Please advise

## 2023-06-17 NOTE — Telephone Encounter (Signed)
 Phone note from 06/13/2023 "Please advise pt, if she would like to pursue work up surrounded elevated glucose we can certainly see her and check A1c.  Her glucose level of 102, is not in the diabetic range, even if she fasted for 9 hours. So I am not overly concerned for diabetes for her, but we can always discuss further if she desires.  "  Please schedule patient for an appointment to discuss her concerns with the provider and have labs drawn.  Please advise patient insurance would likely not cover the cost last of an A1c test, since her glucose was not elevated in the diabetic range and it was not a fasting glucose.

## 2023-06-17 NOTE — Telephone Encounter (Signed)
 Pt advised of recommendations. She declined to schedule at this time but if anything changes, she will call back to schedule

## 2023-06-18 ENCOUNTER — Ambulatory Visit: Payer: Medicare Other | Attending: Family Medicine

## 2023-06-18 ENCOUNTER — Other Ambulatory Visit: Payer: Self-pay

## 2023-06-18 ENCOUNTER — Ambulatory Visit: Payer: Medicare Other | Admitting: Physical Therapy

## 2023-06-18 DIAGNOSIS — M6281 Muscle weakness (generalized): Secondary | ICD-10-CM | POA: Insufficient documentation

## 2023-06-18 DIAGNOSIS — R262 Difficulty in walking, not elsewhere classified: Secondary | ICD-10-CM | POA: Diagnosis not present

## 2023-06-18 DIAGNOSIS — R29898 Other symptoms and signs involving the musculoskeletal system: Secondary | ICD-10-CM | POA: Insufficient documentation

## 2023-06-18 DIAGNOSIS — H8113 Benign paroxysmal vertigo, bilateral: Secondary | ICD-10-CM | POA: Insufficient documentation

## 2023-06-18 DIAGNOSIS — G8929 Other chronic pain: Secondary | ICD-10-CM | POA: Insufficient documentation

## 2023-06-18 DIAGNOSIS — M25562 Pain in left knee: Secondary | ICD-10-CM | POA: Insufficient documentation

## 2023-06-18 DIAGNOSIS — M170 Bilateral primary osteoarthritis of knee: Secondary | ICD-10-CM | POA: Diagnosis not present

## 2023-06-18 DIAGNOSIS — R252 Cramp and spasm: Secondary | ICD-10-CM

## 2023-06-18 NOTE — Therapy (Unsigned)
 OUTPATIENT PHYSICAL THERAPY LOWER EXTREMITY EVALUATION   Patient Name: Kayla Rosales MRN: [id number] DOB:07-25-1955, 68 y.o., female Today's Date: 06/19/2023  END OF SESSION:  PT End of Session - 06/18/23 1459     Visit Number 1    Date for PT Re-Evaluation 08/13/23    Authorization Type Medicare A and B    Progress Note Due on Visit 10    PT Start Time 1454    PT Stop Time 1535    PT Time Calculation (min) 41 min    Activity Tolerance Patient tolerated treatment well    Behavior During Therapy [facility] for tasks assessed/performed             Past Medical History:  Diagnosis Date   Allergy    Arthritis    Asthma    Back pain    Benign positional vertigo    Bilateral swelling of feet    Bronchitis    Chicken pox    Colon polyps    Constipation    Cough 10/09/2020   COVID-19 10/09/2020   Diverticulitis    Early menopause occurring in patient age younger than 45 years    Fibromyalgia    GERD (gastroesophageal reflux disease)    Gluten intolerance    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 Still unclear what is cuasing her pain. May be nerve related. May benefit from starting on gabapentin. Start gabapentin 300 mg TID. F/u 4 weeks   Night sweats    Osteopenia    Palpitations    Recurrent upper respiratory infection (URI)    Right arm weakness 09/28/2015   Last Assessment & Plan:  Formatting of this note might be different from the original. Referral to PT today. 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    Vertigo 08/11/2015   Last Assessment & Plan:  Formatting of this note might be different from the original. Concern given gradual  onset over the last week and a half in addition to neck pain. Patient needs neuroimaging to rule out dissecting vertebral aneurysm. Patient advised to go to emergency room. She declined ambulance transport   Vitamin B 12 deficiency    Vitamin D deficiency    Past Surgical History:  Procedure Laterality Date   CARPAL TUNNEL RELEASE     COLONOSCOPY  2015   Dr. Jean Rosenthal- [facility], [place]; 5 yr repeat recommended   COLONOSCOPY W/ POLYPECTOMY  05/26/2018   x5 polyps   KNEE ARTHROSCOPY Bilateral 2013   meniscus  x2   NECK SURGERY  2018   disc fusion   UPPER GASTROINTESTINAL ENDOSCOPY     Patient Active Problem List   Diagnosis Date Noted   BMI 30.0-30.9,adult 05/01/2023   Well woman exam with routine gynecological exam 04/30/2023   Vitamin D deficiency 01/21/2022   Other tear of medial meniscus, current injury, left knee, subsequent encounter 01/14/2022   Other adverse food reactions, not elsewhere classified, subsequent encounter 03/28/2021   Diastolic dysfunction 08/02/2019   Heart murmur 02/16/2019   Hyperlipidemia LDL goal <100 02/16/2019   Class 1 obesity with serious comorbidity and body mass index (  BMI) of 30.0 to 30.9 in adult 01/04/2019   Pelvic floor dysfunction in female 12/06/2018   Constipation due to outlet dysfunction 12/06/2018   Lactose intolerance 12/06/2018   Diverticulosis 05/27/2018   Osteopenia 01/19/2018   Irritable bowel syndrome with constipation 01/17/2018   History of colonic polyp 01/17/2018   History of stroke 01/15/2018   GERD (gastroesophageal reflux disease)    Asthma    S/P cervical spinal fusion 09/29/2017   Macrocytosis without anemia 11/25/2015   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   Heart palpitations 09/08/2015   Vitamin B12 deficiency 04/06/2014   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   Vestibular neuritis 10/15/2006   Family history of malignant neoplasm of breast 03/18/2006   Diaphragmatic hernia 03/18/2006   Premature menopause  03/18/2006    PCP: Natalia Leatherwood, DO  REFERRING PROVIDER: Natalia Leatherwood, DO  REFERRING DIAG: R29.898 (ICD-10-CM) - Weakness of both lower extremities  THERAPY DIAG:  Muscle weakness (generalized) - Plan: PT plan of care cert/re-cert  Difficulty in walking, not elsewhere classified - Plan: PT plan of care cert/re-cert  Cramp and spasm - Plan: PT plan of care cert/re-cert  Bilateral primary osteoarthritis of knee - Plan: PT plan of care cert/re-cert  Rationale for Evaluation and Treatment: Rehabilitation  ONSET DATE: 06/12/2023  SUBJECTIVE:   SUBJECTIVE STATEMENT: Patient reports bilateral leg weakness.  She has experienced off and on leg and bilateral knee pain for several years and understands that she has advanced bilateral knee OA.  She feels that her weakness is just more pronounced than just her knee issues.  She was always very active, playing softball and exercising on a regular basis.  She does take statins but never had any issues with pain and weakness related to this.  She is concerned about fall risk, especially when caring for her grandchildren.  She hopes to gain strength and be able to feel confident with routine daily activity and negotiating stairs.     PERTINENT HISTORY: Bilateral knee OA PAIN:  Are you having pain? Yes: NPRS scale: 0/10, 8-9/10 at worst  Pain location: bilateral knees, hamstrings,  hips and low back Pain description: aching Aggravating factors: prolonged standing or walking/bending, stooping and squatting Relieving factors: rest  PRECAUTIONS: Fall  RED FLAGS: None   WEIGHT BEARING RESTRICTIONS: No  FALLS:  Has patient fallen in last 6 months? No  LIVING ENVIRONMENT: Lives with: lives with their family and lives with their spouse Lives in: House/apartment Stairs: Yes: Internal: 12 steps; on right going up and External: 4 steps; on right going up Has following equipment at home: None  OCCUPATION: Retired Runner, broadcasting/film/video, played softball  and did her own yardwork when she was single  PLOF: Independent, Independent with basic ADLs, Independent with household mobility without device, Independent with community mobility without device, Independent with homemaking with ambulation, Independent with gait, and Independent with transfers  PATIENT GOALS: She hopes to gain strength and be able to feel confident with routine daily activity and negotiating stairs.     NEXT MD VISIT: prn  OBJECTIVE:  Note: Objective measures were completed at Evaluation unless otherwise noted.  DIAGNOSTIC FINDINGS: na  PATIENT SURVEYS:  LEFS 29/80= 36.3%  COGNITION: Overall cognitive status: Within functional limits for tasks assessed     SENSATION: WFL   MUSCLE LENGTH: Hamstrings: Right 50 deg; Left 55 deg Thomas test: Right pos; Left pos  POSTURE: No Significant postural limitations  PALPATION: Crepitus bilateral patellofemoral with seated flexion/extension  LOWER EXTREMITY ROM:  [facility]  LOWER EXTREMITY MMT:  Generally 4 to 4+/5 with exception of right hip abduction 4-,  right hip ER 4-,  left hip abd and ER4-/5.    LOWER EXTREMITY SPECIAL TESTS:  Knee special tests: Patellafemoral grind test: positive   FUNCTIONAL TESTS:  5 times sit to stand: complete next visit ; time constraints 3 minute walk test: complete next visit; time constraints  GAIT: Distance walked: 30 feet Assistive device utilized: None Level of assistance: Complete Independence Comments: guarded                                                                                                                                TREATMENT DATE:  06/18/23  Initial eval completed and initiated HEP   PATIENT EDUCATION:  Education details: Initiated HEP Person educated: Patient Education method: Programmer, multimedia, Facilities manager, Verbal cues, and Handouts Education comprehension: verbalized understanding, returned demonstration, and verbal cues required  HOME EXERCISE  PROGRAM: Access Code: [email or web address] URL: [email or web address] Date: 06/18/2023 Prepared by: Mikey Kirschner  Exercises - Standing Hamstring Stretch on Chair  - 1 x daily - 7 x weekly - 1 sets - 3 reps - 30 sec hold - Quadricep Stretch with Chair and Counter Support  - 1 x daily - 7 x weekly - 1 sets - 3 reps - 30 sec hold - Seated Figure 4 Piriformis Stretch  - 1 x daily - 7 x weekly - 1 sets - 3 reps - 30 sed hold  ASSESSMENT:  CLINICAL IMPRESSION: Patient is a 68 y.o. female who was seen today for physical therapy evaluation and treatment for bilateral LE weakness.  She presents with decreased LE strength, primarily quads and hamstrings.  This is likely due to her bilateral knee OA but she has also experienced generalized weakness for quite some time due to low back pain and not being active.  She also has some flexibility issues that are also likely contributing to her decreased tolerance for walking and exercise.  She would benefit from skilled PT for bilateral LE flexibility exercises and strengthening along with balance and gait training.   OBJECTIVE IMPAIRMENTS: decreased activity tolerance, decreased balance, decreased endurance, decreased knowledge of condition, decreased mobility, difficulty walking, decreased strength, increased muscle spasms, impaired flexibility, and pain.   ACTIVITY LIMITATIONS: carrying, lifting, bending, standing, squatting, sleeping, stairs, transfers, dressing, and caring for others  PARTICIPATION LIMITATIONS: meal prep, cleaning, laundry, driving, shopping, community activity, occupation, yard work, and church  PERSONAL FACTORS: Fitness, Past/current experiences, and 1-2 comorbidities: asthma, hx stroke, osteopenia  are also affecting patient's functional outcome.   REHAB POTENTIAL: Fair multiple co-morbidities  CLINICAL DECISION MAKING: Evolving/moderate complexity  EVALUATION COMPLEXITY: Moderate   GOALS: Goals reviewed with patient?  Yes  SHORT TERM GOALS: Target date: 07/16/2023  Patient will be independent with initial HEP  Baseline: Goal status: INITIAL  2.  Pain report to be no greater than 4/10  Baseline:  Goal status: INITIAL   LONG TERM  GOALS: Target date: 08/13/2023   Patient to be independent with advanced HEP  Baseline:  Goal status: INITIAL  2.  Patient to report pain no greater than 2/10  Baseline:  Goal status: INITIAL  3.  Patient to be able to stand or walk for at least 15 min without rest Baseline:  Goal status: INITIAL  4.  LEFS score to improve by 3-5 points Baseline:  Goal status: INITIAL  5.  3 min walk test to improve by 100 feet Baseline:  Goal status: INITIAL  6.  Patient to be able to ascend and descend steps without confidence reported at 90%  Baseline:  Goal status: INITIAL   PLAN:  PT FREQUENCY: 1-2x/week  PT DURATION: 8 weeks  PLANNED INTERVENTIONS: 97110-Therapeutic exercises, 97530- Therapeutic activity, [id number]- Neuromuscular re-education, 97535- Self Care, [id number]- Manual therapy, [id number]- Gait training, [id number]- Aquatic Therapy, [phone]- Electrical stimulation (unattended), [phone]- Electrical stimulation (manual), [id number]- Vasopneumatic device, [id number]- Ultrasound, [id number]- Traction (mechanical), [id number]- Ionotophoresis 4mg /ml Dexamethasone, Patient/Family education, Balance training, Stair training, Taping, Dry Needling, Joint mobilization, Spinal mobilization, Scar mobilization, Vestibular training, Visual/preceptual remediation/compensation, Cryotherapy, and Moist heat  PLAN FOR NEXT SESSION: Review HEP, Nustep, begin quad rehab and general LE strengthening.    Victorino Dike B. Delilah Mulgrew, PT 06/19/23 9:22 AM [facility] [address] [place], [place] [address] Phone # [phone] Fax [phone]

## 2023-07-01 ENCOUNTER — Ambulatory Visit

## 2023-07-03 ENCOUNTER — Ambulatory Visit

## 2023-07-03 DIAGNOSIS — R252 Cramp and spasm: Secondary | ICD-10-CM

## 2023-07-03 DIAGNOSIS — R29898 Other symptoms and signs involving the musculoskeletal system: Secondary | ICD-10-CM | POA: Diagnosis not present

## 2023-07-03 DIAGNOSIS — M170 Bilateral primary osteoarthritis of knee: Secondary | ICD-10-CM

## 2023-07-03 DIAGNOSIS — G8929 Other chronic pain: Secondary | ICD-10-CM

## 2023-07-03 DIAGNOSIS — M6281 Muscle weakness (generalized): Secondary | ICD-10-CM

## 2023-07-03 DIAGNOSIS — H8113 Benign paroxysmal vertigo, bilateral: Secondary | ICD-10-CM

## 2023-07-03 DIAGNOSIS — R262 Difficulty in walking, not elsewhere classified: Secondary | ICD-10-CM

## 2023-07-03 NOTE — Therapy (Signed)
 OUTPATIENT PHYSICAL THERAPY TREATMENT   Patient Name: Kayla Rosales MRN: [id number] DOB:26-Jul-1955, 68 y.o., female Today's Date: 07/03/2023  END OF SESSION:  PT End of Session - 07/03/23 1032     Visit Number 2    Date for PT Re-Evaluation 08/13/23    Authorization Type Medicare A and B    Progress Note Due on Visit 10    PT Start Time 0932    PT Stop Time 1015    PT Time Calculation (min) 43 min    Activity Tolerance Patient tolerated treatment well    Behavior During Therapy [facility] for tasks assessed/performed              Past Medical History:  Diagnosis Date   Allergy    Arthritis    Asthma    Back pain    Benign positional vertigo    Bilateral swelling of feet    Bronchitis    Chicken pox    Colon polyps    Constipation    Cough 10/09/2020   COVID-19 10/09/2020   Diverticulitis    Early menopause occurring in patient age younger than 45 years    Fibromyalgia    GERD (gastroesophageal reflux disease)    Gluten intolerance    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 Still unclear what is cuasing her pain. May be nerve related. May benefit from starting on gabapentin. Start gabapentin 300 mg TID. F/u 4 weeks   Night sweats    Osteopenia    Palpitations    Recurrent upper respiratory infection (URI)    Right arm weakness 09/28/2015   Last Assessment & Plan:  Formatting of this note might be different from the original. Referral to PT today. 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    Vertigo 08/11/2015   Last Assessment & Plan:  Formatting of this note might be different from the original. Concern given gradual onset over the  last week and a half in addition to neck pain. Patient needs neuroimaging to rule out dissecting vertebral aneurysm. Patient advised to go to emergency room. She declined ambulance transport   Vitamin B 12 deficiency    Vitamin D deficiency    Past Surgical History:  Procedure Laterality Date   CARPAL TUNNEL RELEASE     COLONOSCOPY  2015   Dr. Jean Rosenthal- [facility], [place]; 5 yr repeat recommended   COLONOSCOPY W/ POLYPECTOMY  05/26/2018   x5 polyps   KNEE ARTHROSCOPY Bilateral 2013   meniscus  x2   NECK SURGERY  2018   disc fusion   UPPER GASTROINTESTINAL ENDOSCOPY     Patient Active Problem List   Diagnosis Date Noted   BMI 30.0-30.9,adult 05/01/2023   Well woman exam with routine gynecological exam 04/30/2023   Vitamin D deficiency 01/21/2022   Other tear of medial meniscus, current injury, left knee, subsequent encounter 01/14/2022   Other adverse food reactions, not elsewhere classified, subsequent encounter 03/28/2021   Diastolic dysfunction 08/02/2019   Heart murmur 02/16/2019   Hyperlipidemia LDL goal <100 02/16/2019   Class 1 obesity with serious comorbidity and body mass index (BMI)  of 30.0 to 30.9 in adult 01/04/2019   Pelvic floor dysfunction in female 12/06/2018   Constipation due to outlet dysfunction 12/06/2018   Lactose intolerance 12/06/2018   Diverticulosis 05/27/2018   Osteopenia 01/19/2018   Irritable bowel syndrome with constipation 01/17/2018   History of colonic polyp 01/17/2018   History of stroke 01/15/2018   GERD (gastroesophageal reflux disease)    Asthma    S/P cervical spinal fusion 09/29/2017   Macrocytosis without anemia 11/25/2015   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   Heart palpitations 09/08/2015   Vitamin B12 deficiency 04/06/2014   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   Vestibular neuritis 10/15/2006   Family history of malignant neoplasm of breast 03/18/2006   Diaphragmatic hernia 03/18/2006   Premature menopause 03/18/2006     PCP: Natalia Leatherwood, DO  REFERRING PROVIDER: Natalia Leatherwood, DO  REFERRING DIAG: R29.898 (ICD-10-CM) - Weakness of both lower extremities  THERAPY DIAG:  Muscle weakness (generalized) - Plan: PT plan of care cert/re-cert  Difficulty in walking, not elsewhere classified - Plan: PT plan of care cert/re-cert  Cramp and spasm - Plan: PT plan of care cert/re-cert  Bilateral primary osteoarthritis of knee - Plan: PT plan of care cert/re-cert  Chronic pain of left knee - Plan: PT plan of care cert/re-cert  BPPV (benign paroxysmal positional vertigo), bilateral - Plan: PT plan of care cert/re-cert  Rationale for Evaluation and Treatment: Rehabilitation  ONSET DATE: 06/12/2023  SUBJECTIVE:   SUBJECTIVE STATEMENT: I was doing the exercises and might have been doing them too much because I had some pain.  I was with my 72 year old grandson and that was painful.    From eval: Patient reports bilateral leg weakness.  She has experienced off and on leg and bilateral knee pain for several years and understands that she has advanced bilateral knee OA.  She feels that her weakness is just more pronounced than just her knee issues.  She was always very active, playing softball and exercising on a regular basis.  She does take statins but never had any issues with pain and weakness related to this.  She is concerned about fall risk, especially when caring for her grandchildren.  She hopes to gain strength and be able to feel confident with routine daily activity and negotiating stairs.     PERTINENT HISTORY: Bilateral knee OA PAIN: 07/03/23 Are you having pain? Yes: NPRS scale: 6/10   Pain location: bilateral knees, hamstrings,  hips and low back Pain description: aching Aggravating factors: prolonged standing or walking/bending, stooping and squatting Relieving factors: rest  PRECAUTIONS: Fall  RED FLAGS: None   WEIGHT BEARING RESTRICTIONS: No  FALLS:  Has patient fallen in last 6  months? 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     NEXT MD VISIT: prn  OBJECTIVE:  Note: Objective measures were completed at Evaluation unless otherwise noted.  DIAGNOSTIC FINDINGS: na  PATIENT SURVEYS:  LEFS 29/80= 36.3%  COGNITION: Overall cognitive status: Within functional limits for tasks assessed     SENSATION: WFL   MUSCLE LENGTH: Hamstrings: Right 50 deg; Left 55 deg Thomas test: Right pos; Left pos  POSTURE: No Significant postural limitations  PALPATION: Crepitus bilateral patellofemoral with seated flexion/extension  LOWER EXTREMITY ROM:  WFL  LOWER EXTREMITY MMT:  Generally 4 to 4+/5 with exception of right hip abduction 4-,  right hip ER 4-,  left hip abd and ER4-/5.    LOWER EXTREMITY SPECIAL TESTS:  Knee special tests: Patellafemoral grind test: positive   FUNCTIONAL TESTS:  5 times sit to stand: complete next visit ; time constraints 3 minute walk test: complete next visit; time constraints  GAIT: Distance walked: 30 feet Assistive device utilized: None Level of assistance: Complete Independence Comments: guarded                                                                                                                                TREATMENT DATE:  07/03/23  NuStep:  Level 4x 8 minutes- PT present to discuss progress  Seated hamstring stretch and figure 4 3x20 seconds  Sidelying clam and reverse clam: 2x10 bil  Trigger Point Dry Needling  Initial Treatment: Pt instructed on Dry Needling rational, procedures, and possible side effects. Pt instructed to expect mild to moderate muscle soreness later in the day and/or into the next day.  Pt instructed in methods to reduce muscle soreness. Pt instructed to continue prescribed HEP. Because Dry Needling was performed over or adjacent to a lung field, pt was educated on S/S of pneumothorax and to seek immediate medical attention should they occur.  Patient was educated on signs and symptoms of infection and other risk factors and advised to seek medical attention should they occur.  Patient verbalized understanding of these instructions and education.   Patient Verbal Consent Given: Yes Education Handout Provided: Yes Muscles Treated: bil gluteals and lumbar multifidi Electrical Stimulation Performed: No Treatment Response/Outcome: twitch response and improve tissue mobility  Manual to treated muscles after dry needling   06/18/23  Initial eval completed and initiated HEP   PATIENT EDUCATION:  Education details: Initiated HEP, dry needling info Person educated: Patient Education method: Programmer, multimedia, Demonstration, Verbal cues, and Handouts Education comprehension: verbalized understanding, returned demonstration, and verbal cues required  HOME EXERCISE PROGRAM: Access Code: [email or web address] URL: [email or web address] Date: 07/03/2023 Prepared by: Tresa Endo  Exercises - Standing Hamstring Stretch on Chair  - 1 x daily - 7 x weekly - 1 sets - 3 reps - 30 sec hold - Quadricep Stretch with Chair and Counter Support  - 1 x daily - 7 x weekly - 1 sets - 3 reps - 30 sec hold - Seated Figure 4 Piriformis Stretch  - 1 x daily - 7 x weekly - 1 sets - 3 reps - 30 sed hold - Sit to Stand Without Arm Support   -  1 x daily - 7 x weekly - 2 sets - 10 reps - Sidelying Reverse Clamshell  - 1 x daily - 7 x weekly - 2 sets - 10 reps - Clamshell  - 1 x daily - 7 x weekly - 2 sets - 10 reps  ASSESSMENT:  CLINICAL IMPRESSION: First time follow-up after evaluation.  Pt did well with all exercise in the clinic today and reports some gluteal pain with sit to stand.  Pt with good response to dry needling with improved tissue mobility .  Pt with dizziness with rolling in bed and change of head position.  PT sent order to MD today and she will have this assessed by vestibular PT soon. She would benefit from skilled PT for bilateral LE flexibility exercises and strengthening along with balance and gait training.   OBJECTIVE IMPAIRMENTS: decreased activity tolerance, decreased balance, decreased endurance, decreased knowledge of condition, decreased mobility, difficulty walking, decreased strength, increased muscle spasms, impaired flexibility, and pain.   ACTIVITY LIMITATIONS: carrying, lifting, bending, standing, squatting, sleeping, stairs, transfers, dressing, and caring for others  PARTICIPATION LIMITATIONS: meal prep, cleaning, laundry, driving, shopping, community activity, occupation, yard work, and church  PERSONAL FACTORS: Fitness, Past/current experiences, and 1-2 comorbidities: asthma, hx stroke, osteopenia  are also affecting patient's functional outcome.   REHAB POTENTIAL: Fair multiple co-morbidities  CLINICAL DECISION MAKING: Evolving/moderate complexity  EVALUATION COMPLEXITY: Moderate   GOALS: Goals reviewed with patient? Yes  SHORT TERM GOALS: Target date: 07/16/2023  Patient will be independent with initial HEP  Baseline: Goal status: in progress   2.  Pain report to be no greater than 4/10  Baseline: 6/10 (07/03/23) Goal status: In progress    LONG TERM GOALS: Target date: 08/27/23   Patient to be independent with advanced HEP  Baseline:  Goal status: INITIAL  2.  Patient to  report pain no greater than 2/10  Baseline:  Goal status: INITIAL  3.  Patient to be able to stand or walk for at least 15 min without rest Baseline:  Goal status: INITIAL  4.  LEFS score to improve by 3-5 points Baseline:  Goal status: INITIAL  5.  3 min walk test to improve by 100 feet Baseline:  Goal status: INITIAL  6.  Patient to be able to ascend and descend steps without confidence reported at 90%  Baseline:  Goal status: INITIAL   PLAN:  PT FREQUENCY: 1-2x/week  PT DURATION: 8 weeks  PLANNED INTERVENTIONS: 97110-Therapeutic exercises, 97530- Therapeutic activity, [id number]- Neuromuscular re-education, 97535- Self Care, [id number]- Manual therapy, [id number]- Gait training, [phone]- Canalith repositioning, [id number]- Aquatic Therapy, [id number]- Electrical stimulation (unattended), [id number]- Electrical stimulation (manual), [id number]- Vasopneumatic device, [id number]- Ultrasound, [id number]- Traction (mechanical), [id number]- Ionotophoresis 4mg /ml Dexamethasone, Patient/Family education, Balance training, Stair training, Taping, Dry Needling, Joint mobilization, Spinal mobilization, Scar mobilization, Vestibular training, Visual/preceptual remediation/compensation, Cryotherapy, and Moist heat  PLAN FOR NEXT SESSION: Review HEP, Nustep, begin quad rehab and general LE strengthening. See how pt responded to dry needling.  Balance exercises     Lorrene Reid, PT 07/03/23 11:22 AM  [facility] [address] [place], [place] [address] Phone # [phone] Fax [phone]

## 2023-07-03 NOTE — Patient Instructions (Signed)

## 2023-07-08 ENCOUNTER — Ambulatory Visit

## 2023-07-08 DIAGNOSIS — M170 Bilateral primary osteoarthritis of knee: Secondary | ICD-10-CM

## 2023-07-08 DIAGNOSIS — G8929 Other chronic pain: Secondary | ICD-10-CM

## 2023-07-08 DIAGNOSIS — M6281 Muscle weakness (generalized): Secondary | ICD-10-CM

## 2023-07-08 DIAGNOSIS — R262 Difficulty in walking, not elsewhere classified: Secondary | ICD-10-CM

## 2023-07-08 DIAGNOSIS — R252 Cramp and spasm: Secondary | ICD-10-CM

## 2023-07-08 DIAGNOSIS — R29898 Other symptoms and signs involving the musculoskeletal system: Secondary | ICD-10-CM | POA: Diagnosis not present

## 2023-07-08 NOTE — Therapy (Signed)
 OUTPATIENT PHYSICAL THERAPY TREATMENT   Patient Name: Kayla Rosales MRN: [id number] DOB:[birth year], 68 y.o., female Today's Date: 07/08/2023  END OF SESSION:  PT End of Session - 07/08/23 0944     Visit Number 3    Date for PT Re-Evaluation 08/13/23    Authorization Type Medicare A and B    Progress Note Due on Visit 10    PT Start Time 0930    PT Stop Time 1016    PT Time Calculation (min) 46 min    Activity Tolerance Patient tolerated treatment well    Behavior During Therapy [facility] for tasks assessed/performed              Past Medical History:  Diagnosis Date   Allergy    Arthritis    Asthma    Back pain    Benign positional vertigo    Bilateral swelling of feet    Bronchitis    Chicken pox    Colon polyps    Constipation    Cough 10/09/2020   COVID-19 10/09/2020   Diverticulitis    Early menopause occurring in patient age younger than 45 years    Fibromyalgia    GERD (gastroesophageal reflux disease)    Gluten intolerance    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 Still unclear what is cuasing her pain. May be nerve related. May benefit from starting on gabapentin. Start gabapentin 300 mg TID. F/u 4 weeks   Night sweats    Osteopenia    Palpitations    Recurrent upper respiratory infection (URI)    Right arm weakness 09/28/2015   Last Assessment & Plan:  Formatting of this note might be different from the original. Referral to PT today. 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    Vertigo 08/11/2015   Last Assessment & Plan:  Formatting of this note might be different from the original. Concern given gradual onset over the  last week and a half in addition to neck pain. Patient needs neuroimaging to rule out dissecting vertebral aneurysm. Patient advised to go to emergency room. She declined ambulance transport   Vitamin B 12 deficiency    Vitamin D deficiency    Past Surgical History:  Procedure Laterality Date   CARPAL TUNNEL RELEASE     COLONOSCOPY  2015   Dr. Jean Rosenthal- [facility], [place]; 5 yr repeat recommended   COLONOSCOPY W/ POLYPECTOMY  05/26/2018   x5 polyps   KNEE ARTHROSCOPY Bilateral 2013   meniscus  x2   NECK SURGERY  2018   disc fusion   UPPER GASTROINTESTINAL ENDOSCOPY     Patient Active Problem List   Diagnosis Date Noted   BMI 30.0-30.9,adult 05/01/2023   Well woman exam with routine gynecological exam 04/30/2023   Vitamin D deficiency 01/21/2022   Other tear of medial meniscus, current injury, left knee, subsequent encounter 01/14/2022   Other adverse food reactions, not elsewhere classified, subsequent encounter 03/28/2021   Diastolic dysfunction 08/02/2019   Heart murmur 02/16/2019   Hyperlipidemia LDL goal <100 02/16/2019   Class 1 obesity with serious comorbidity and body mass index (BMI)  of 30.0 to 30.9 in adult 01/04/2019   Pelvic floor dysfunction in female 12/06/2018   Constipation due to outlet dysfunction 12/06/2018   Lactose intolerance 12/06/2018   Diverticulosis 05/27/2018   Osteopenia 01/19/2018   Irritable bowel syndrome with constipation 01/17/2018   History of colonic polyp 01/17/2018   History of stroke 01/15/2018   GERD (gastroesophageal reflux disease)    Asthma    S/P cervical spinal fusion 09/29/2017   Macrocytosis without anemia 11/25/2015   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   Heart palpitations 09/08/2015   Vitamin B12 deficiency 04/06/2014   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   Vestibular neuritis 10/15/2006   Family history of malignant neoplasm of breast 03/18/2006   Diaphragmatic hernia 03/18/2006   Premature menopause 03/18/2006     PCP: Natalia Leatherwood, DO  REFERRING PROVIDER: Natalia Leatherwood, DO  REFERRING DIAG: R29.898 (ICD-10-CM) - Weakness of both lower extremities  THERAPY DIAG:  Muscle weakness (generalized)  Difficulty in walking, not elsewhere classified  Cramp and spasm  Bilateral primary osteoarthritis of knee  Chronic pain of left knee  Rationale for Evaluation and Treatment: Rehabilitation  ONSET DATE: 06/12/2023  SUBJECTIVE:   SUBJECTIVE STATEMENT: I am still having some left sided low back pain.  That seems to be worse than my legs right now.     From eval: Patient reports bilateral leg weakness.  She has experienced off and on leg and bilateral knee pain for several years and understands that she has advanced bilateral knee OA.  She feels that her weakness is just more pronounced than just her knee issues.  She was always very active, playing softball and exercising on a regular basis.  She does take statins but never had any issues with pain and weakness related to this.  She is concerned about fall risk, especially when caring for her grandchildren.  She hopes to gain strength and be able to feel confident with routine daily activity and negotiating stairs.     PERTINENT HISTORY: Bilateral knee OA PAIN:  07/08/23 Are you having pain? Yes: NPRS scale: 7-8/10   Pain location: bilateral knees, hamstrings,  hips and low back Pain description: aching Aggravating factors: prolonged standing or walking/bending, stooping and squatting Relieving factors: rest  PRECAUTIONS: Fall  RED FLAGS: None   WEIGHT BEARING RESTRICTIONS: No  FALLS:  Has patient fallen in last 6 months? 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     NEXT MD VISIT: prn  OBJECTIVE:  Note: Objective measures were completed at Evaluation unless otherwise noted.  DIAGNOSTIC FINDINGS: na  PATIENT SURVEYS:  LEFS 29/80= 36.3%  COGNITION: Overall cognitive status: Within functional limits for tasks assessed     SENSATION: WFL   MUSCLE LENGTH: Hamstrings: Right 50 deg; Left 55 deg Thomas test: Right pos; Left pos  POSTURE: No Significant postural limitations  PALPATION: Crepitus bilateral patellofemoral with seated flexion/extension  LOWER EXTREMITY  ROM:  WFL  LOWER EXTREMITY MMT:  Generally 4 to 4+/5 with exception of right hip abduction 4-,  right hip ER 4-,  left hip abd and ER4-/5.    LOWER EXTREMITY SPECIAL TESTS:  Knee special tests: Patellafemoral grind test: positive   FUNCTIONAL TESTS:  5 times sit to stand: complete next visit ; time constraints 3 minute walk test: complete next visit; time constraints  GAIT: Distance walked: 30 feet Assistive device utilized: None Level of assistance: Complete Independence Comments: guarded                                                                                                                                TREATMENT DATE:  07/08/23  NuStep: Level 4x 7 minutes- PT present to discuss progress  Standing hamstring stretch 3 x 30 sec each LE Standing quad/hip flexor stretch 3 x 30 sec each LE Sit to stand x 10 Squat to table with balance pad x 10 Seated clam x 20 with yellow loop Supine bridge x 20 PPT x 20 Discussion about pain control and how to manage  back pain.  Suggested possible consult with neurosurgeon since she has not had any evaluation of her back symptoms in several years and she has hx of HNP.    07/03/23  NuStep: Level 4x 8 minutes- PT present to discuss progress  Seated hamstring stretch and figure 4 3x20 seconds  Sidelying clam and reverse clam: 2x10 bil  Trigger Point Dry Needling  Initial Treatment: Pt instructed on Dry Needling rational, procedures, and possible side effects. Pt instructed to expect mild to moderate muscle soreness later in the day and/or into the next day.  Pt instructed in methods to reduce muscle soreness. Pt instructed to continue prescribed HEP. Because Dry Needling was performed over or adjacent to a lung field, pt was educated on S/S of pneumothorax and to seek immediate medical attention should they occur.  Patient was educated on signs and symptoms of infection and other risk factors and advised to seek medical attention should they occur.  Patient verbalized understanding of these instructions and education.   Patient Verbal Consent Given: Yes Education Handout Provided: Yes Muscles Treated: bil gluteals and lumbar multifidi Electrical Stimulation Performed: No Treatment Response/Outcome: twitch response and improve tissue mobility  Manual to treated muscles after dry needling   06/18/23  Initial eval completed and initiated HEP   PATIENT EDUCATION:  Education details: Initiated HEP, dry needling info Person educated: Patient Education method: Programmer, multimedia, Demonstration, Verbal cues, and Handouts Education comprehension: verbalized understanding, returned demonstration, and verbal cues required  HOME EXERCISE PROGRAM: Access Code: [email or web address] URL: [email or web address] Date: 07/03/2023 Prepared by: Tresa Endo  Exercises - Standing Hamstring Stretch on Chair  - 1 x daily - 7 x weekly - 1 sets - 3 reps - 30 sec hold - Quadricep Stretch with Chair and Counter Support  - 1 x daily  - 7 x weekly - 1 sets -  3 reps - 30 sec hold - Seated Figure 4 Piriformis Stretch  - 1 x daily - 7 x weekly - 1 sets - 3 reps - 30 sed hold - Sit to Stand Without Arm Support  - 1 x daily - 7 x weekly - 2 sets - 10 reps - Sidelying Reverse Clamshell  - 1 x daily - 7 x weekly - 2 sets - 10 reps - Clamshell  - 1 x daily - 7 x weekly - 2 sets - 10 reps  ASSESSMENT:  CLINICAL IMPRESSION: Patient is responding well to strengthening but has developed some back pain that is interfering with her progress.  She has hx of HNP.  Suggested she do consult with neurosurgeon just to be sure she is not having recurrence of this issue.  She responded well to DN on the right side of her low back but had some increased pain on the left.  Explained to patient that the needling will reduce the spasm and elongate the muscle but if she is having disc spacing issue or recurrence of HNP, the needling may not resolve that.   She would benefit from continuing skilled PT for bilateral LE flexibility exercises and strengthening along with balance and gait training.   OBJECTIVE IMPAIRMENTS: decreased activity tolerance, decreased balance, decreased endurance, decreased knowledge of condition, decreased mobility, difficulty walking, decreased strength, increased muscle spasms, impaired flexibility, and pain.   ACTIVITY LIMITATIONS: carrying, lifting, bending, standing, squatting, sleeping, stairs, transfers, dressing, and caring for others  PARTICIPATION LIMITATIONS: meal prep, cleaning, laundry, driving, shopping, community activity, occupation, yard work, and church  PERSONAL FACTORS: Fitness, Past/current experiences, and 1-2 comorbidities: asthma, hx stroke, osteopenia  are also affecting patient's functional outcome.   REHAB POTENTIAL: Fair multiple co-morbidities  CLINICAL DECISION MAKING: Evolving/moderate complexity  EVALUATION COMPLEXITY: Moderate   GOALS: Goals reviewed with patient? Yes  SHORT TERM GOALS:  Target date: 07/16/2023  Patient will be independent with initial HEP  Baseline: Goal status: in progress   2.  Pain report to be no greater than 4/10  Baseline: 6/10 (07/03/23) Goal status: In progress    LONG TERM GOALS: Target date: 08/27/23   Patient to be independent with advanced HEP  Baseline:  Goal status: INITIAL  2.  Patient to report pain no greater than 2/10  Baseline:  Goal status: INITIAL  3.  Patient to be able to stand or walk for at least 15 min without rest Baseline:  Goal status: INITIAL  4.  LEFS score to improve by 3-5 points Baseline:  Goal status: INITIAL  5.  3 min walk test to improve by 100 feet Baseline:  Goal status: INITIAL  6.  Patient to be able to ascend and descend steps without confidence reported at 90%  Baseline:  Goal status: INITIAL   PLAN:  PT FREQUENCY: 1-2x/week  PT DURATION: 8 weeks  PLANNED INTERVENTIONS: 97110-Therapeutic exercises, 97530- Therapeutic activity, [id number]- Neuromuscular re-education, 97535- Self Care, [id number]- Manual therapy, [id number]- Gait training, [phone]- Canalith repositioning, [id number]- Aquatic Therapy, U9811- Electrical stimulation (unattended), [id number]- Electrical stimulation (manual), [id number]- Vasopneumatic device, [id number]- Ultrasound, [id number]- Traction (mechanical), [id number]- Ionotophoresis 4mg /ml Dexamethasone, Patient/Family education, Balance training, Stair training, Taping, Dry Needling, Joint mobilization, Spinal mobilization, Scar mobilization, Vestibular training, Visual/preceptual remediation/compensation, Cryotherapy, and Moist heat  PLAN FOR NEXT SESSION: Review HEP, Nustep, begin quad rehab and general LE strengthening.  Balance exercises.     Victorino Dike B. Kayleen Alig, PT 07/08/23 10:30 AM [facility] [address]  [place], [place] [address] Phone # [phone] Fax [phone]

## 2023-07-10 ENCOUNTER — Ambulatory Visit

## 2023-07-10 DIAGNOSIS — R29898 Other symptoms and signs involving the musculoskeletal system: Secondary | ICD-10-CM | POA: Diagnosis not present

## 2023-07-10 DIAGNOSIS — R262 Difficulty in walking, not elsewhere classified: Secondary | ICD-10-CM

## 2023-07-10 DIAGNOSIS — M6281 Muscle weakness (generalized): Secondary | ICD-10-CM

## 2023-07-10 DIAGNOSIS — M170 Bilateral primary osteoarthritis of knee: Secondary | ICD-10-CM

## 2023-07-10 DIAGNOSIS — R252 Cramp and spasm: Secondary | ICD-10-CM

## 2023-07-10 NOTE — Therapy (Signed)
 OUTPATIENT PHYSICAL THERAPY TREATMENT   Patient Name: Kayla Rosales MRN: [id number] DOB:[birth year], 68 y.o., female Today's Date: 07/10/2023  END OF SESSION:  PT End of Session - 07/10/23 1059     Visit Number 4    Date for PT Re-Evaluation 08/13/23    Authorization Type Medicare A and B    Progress Note Due on Visit 10    PT Start Time 1017    PT Stop Time 1100    PT Time Calculation (min) 43 min    Activity Tolerance Patient tolerated treatment well    Behavior During Therapy WFL for tasks assessed/performed               Past Medical History:  Diagnosis Date   Allergy    Arthritis    Asthma    Back pain    Benign positional vertigo    Bilateral swelling of feet    Bronchitis    Chicken pox    Colon polyps    Constipation    Cough 10/09/2020   COVID-19 10/09/2020   Diverticulitis    Early menopause occurring in patient age younger than 45 years    Fibromyalgia    GERD (gastroesophageal reflux disease)    Gluten intolerance    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 Still unclear what is cuasing her pain. May be nerve related. May benefit from starting on gabapentin. Start gabapentin 300 mg TID. F/u 4 weeks   Night sweats    Osteopenia    Palpitations    Recurrent upper respiratory infection (URI)    Right arm weakness 09/28/2015   Last Assessment & Plan:  Formatting of this note might be different from the original. Referral to PT today. 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    Vertigo 08/11/2015   Last Assessment & Plan:  Formatting of this note might be different from the original. Concern given gradual onset over  the last week and a half in addition to neck pain. Patient needs neuroimaging to rule out dissecting vertebral aneurysm. Patient advised to go to emergency room. She declined ambulance transport   Vitamin B 12 deficiency    Vitamin D deficiency    Past Surgical History:  Procedure Laterality Date   CARPAL TUNNEL RELEASE     COLONOSCOPY  2015   Dr. Jean Rosenthal- [facility], [place]; 5 yr repeat recommended   COLONOSCOPY W/ POLYPECTOMY  05/26/2018   x5 polyps   KNEE ARTHROSCOPY Bilateral 2013   meniscus  x2   NECK SURGERY  2018   disc fusion   UPPER GASTROINTESTINAL ENDOSCOPY     Patient Active Problem List   Diagnosis Date Noted   BMI 30.0-30.9,adult 05/01/2023   Well woman exam with routine gynecological exam 04/30/2023   Vitamin D deficiency 01/21/2022   Other tear of medial meniscus, current injury, left knee, subsequent encounter 01/14/2022   Other adverse food reactions, not elsewhere classified, subsequent encounter 03/28/2021   Diastolic dysfunction 08/02/2019   Heart murmur 02/16/2019   Hyperlipidemia LDL goal <100 02/16/2019   Class 1 obesity with serious comorbidity and body mass index (  BMI) of 30.0 to 30.9 in adult 01/04/2019   Pelvic floor dysfunction in female 12/06/2018   Constipation due to outlet dysfunction 12/06/2018   Lactose intolerance 12/06/2018   Diverticulosis 05/27/2018   Osteopenia 01/19/2018   Irritable bowel syndrome with constipation 01/17/2018   History of colonic polyp 01/17/2018   History of stroke 01/15/2018   GERD (gastroesophageal reflux disease)    Asthma    S/P cervical spinal fusion 09/29/2017   Macrocytosis without anemia 11/25/2015   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   Heart palpitations 09/08/2015   Vitamin B12 deficiency 04/06/2014   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   Vestibular neuritis 10/15/2006   Family history of malignant neoplasm of breast 03/18/2006   Diaphragmatic hernia 03/18/2006   Premature menopause 03/18/2006     PCP: Natalia Leatherwood, DO  REFERRING PROVIDER: Natalia Leatherwood, DO  REFERRING DIAG: R29.898 (ICD-10-CM) - Weakness of both lower extremities  THERAPY DIAG:  Muscle weakness (generalized)  Difficulty in walking, not elsewhere classified  Cramp and spasm  Bilateral primary osteoarthritis of knee  Rationale for Evaluation and Treatment: Rehabilitation  ONSET DATE: 06/12/2023  SUBJECTIVE:   SUBJECTIVE STATEMENT: I felt better after last session.  I was on the road yesterday so I didn't do my exercises.   From eval: Patient reports bilateral leg weakness.  She has experienced off and on leg and bilateral knee pain for several years and understands that she has advanced bilateral knee OA.  She feels that her weakness is just more pronounced than just her knee issues.  She was always very active, playing softball and exercising on a regular basis.  She does take statins but never had any issues with pain and weakness related to this.  She is concerned about fall risk, especially when caring for her grandchildren.  She hopes to gain strength and be able to feel confident with routine daily activity and negotiating stairs.     PERTINENT HISTORY: Bilateral knee OA PAIN:  07/08/23 Are you having pain? Yes: NPRS scale: 6/10   Pain location: bilateral knees, hamstrings,  hips and low back Pain description: aching Aggravating factors: prolonged standing or walking/bending, stooping and squatting Relieving factors: rest  PRECAUTIONS: Fall  RED FLAGS: None   WEIGHT BEARING RESTRICTIONS: No  FALLS:  Has patient fallen in last 6 months? 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     NEXT MD VISIT: prn  OBJECTIVE:  Note: Objective measures were completed at Evaluation unless otherwise noted.  DIAGNOSTIC FINDINGS: na  PATIENT SURVEYS:  LEFS 29/80= 36.3%  COGNITION: Overall cognitive status: Within functional limits for tasks assessed     SENSATION: WFL   MUSCLE LENGTH: Hamstrings: Right 50 deg; Left 55 deg Thomas test: Right pos; Left pos  POSTURE: No Significant postural limitations  PALPATION: Crepitus bilateral patellofemoral with seated flexion/extension  LOWER EXTREMITY ROM:  WFL  LOWER EXTREMITY MMT:  Generally  4 to 4+/5 with exception of right hip abduction 4-,  right hip ER 4-,  left hip abd and ER4-/5.    LOWER EXTREMITY SPECIAL TESTS:  Knee special tests: Patellafemoral grind test: positive   FUNCTIONAL TESTS:  5 times sit to stand: complete next visit ; time constraints 3 minute walk test: complete next visit; time constraints  GAIT: Distance walked: 30 feet Assistive device utilized: None Level of assistance: Complete Independence Comments: guarded                                                                                                                                TREATMENT DATE:   07/10/23  NuStep: Level 4x 7 minutes- PT present to discuss progress  Standing hamstring stretch 3 x 30 sec each LE Figure 4 sitting 3x20 seconds  Standing quad/hip flexor stretch 3 x 30 sec each LE Sit to stand with balance pad: 5# x15 Squat to table with balance pad x 10 supine clam x 20 with yellow loop Supine bridge x 20 Standing rockerboard: x 2 minutes 5#  kettlebell in Farmer's carry: alternating step taps on 4" stepx20 07/08/23  NuStep: Level 4x 7 minutes- PT present to discuss progress  Standing hamstring stretch 3 x 30 sec each LE Standing quad/hip flexor stretch 3 x 30 sec each LE Sit to stand x 10 Squat to table with balance pad x 10 Seated clam x 20 with yellow loop Supine bridge x 20 PPT x 20 Discussion about pain control and how to manage back pain.  Suggested possible consult with neurosurgeon since she has not had any evaluation of her back symptoms in several years and she has hx of HNP.    07/03/23  NuStep: Level 4x 8 minutes- PT present to discuss progress  Seated hamstring stretch and figure 4 3x20 seconds  Sidelying clam and reverse clam: 2x10 bil  Trigger Point Dry Needling  Initial Treatment: Pt instructed on Dry Needling rational, procedures, and possible side effects. Pt instructed to expect mild to moderate muscle soreness later in the day and/or into the next day.  Pt instructed in methods to reduce muscle soreness. Pt instructed to continue prescribed HEP. Because Dry Needling was performed over or adjacent to a lung field, pt was educated on S/S of pneumothorax and to seek immediate medical attention should they occur.  Patient was educated on signs and symptoms of infection and other risk factors and advised to seek medical attention should they occur.  Patient verbalized understanding of these instructions and education.   Patient Verbal Consent Given: Yes Education Handout Provided: Yes Muscles Treated: bil gluteals and lumbar multifidi Electrical Stimulation Performed: No Treatment Response/Outcome: twitch response and improve tissue mobility  Manual to treated muscles after dry needling  PATIENT EDUCATION:  Education details: Initiated HEP, dry needling info Person educated: Patient Education method: Programmer, multimedia, Demonstration, Verbal cues, and Handouts Education comprehension: verbalized understanding,  returned demonstration, and verbal cues  required  HOME EXERCISE PROGRAM: Access Code: [email or web address] URL: [email or web address] Date: 07/03/2023 Prepared by: Tresa Endo  Exercises - Standing Hamstring Stretch on Chair  - 1 x daily - 7 x weekly - 1 sets - 3 reps - 30 sec hold - Quadricep Stretch with Chair and Counter Support  - 1 x daily - 7 x weekly - 1 sets - 3 reps - 30 sec hold - Seated Figure 4 Piriformis Stretch  - 1 x daily - 7 x weekly - 1 sets - 3 reps - 30 sed hold - Sit to Stand Without Arm Support  - 1 x daily - 7 x weekly - 2 sets - 10 reps - Sidelying Reverse Clamshell  - 1 x daily - 7 x weekly - 2 sets - 10 reps - Clamshell  - 1 x daily - 7 x weekly - 2 sets - 10 reps  ASSESSMENT:  CLINICAL IMPRESSION: Pt reports that she felt better after last session.  Pt has not been exercising regularly due to travel this week.  PT emphasized importance of compliance with HEP for progress.  Pt has made an appt to see a neurosurgeon.  PT monitored throughout session for technique, safety and pain.  She would benefit from continuing skilled PT for bilateral LE flexibility exercises and strengthening along with balance and gait training.   OBJECTIVE IMPAIRMENTS: decreased activity tolerance, decreased balance, decreased endurance, decreased knowledge of condition, decreased mobility, difficulty walking, decreased strength, increased muscle spasms, impaired flexibility, and pain.   ACTIVITY LIMITATIONS: carrying, lifting, bending, standing, squatting, sleeping, stairs, transfers, dressing, and caring for others  PARTICIPATION LIMITATIONS: meal prep, cleaning, laundry, driving, shopping, community activity, occupation, yard work, and church  PERSONAL FACTORS: Fitness, Past/current experiences, and 1-2 comorbidities: asthma, hx stroke, osteopenia  are also affecting patient's functional outcome.   REHAB POTENTIAL: Fair multiple co-morbidities  CLINICAL DECISION MAKING: Evolving/moderate  complexity  EVALUATION COMPLEXITY: Moderate   GOALS: Goals reviewed with patient? Yes  SHORT TERM GOALS: Target date: 07/16/2023  Patient will be independent with initial HEP  Baseline: Goal status: in progress   2.  Pain report to be no greater than 4/10  Baseline: 6/10 (07/10/23) Goal status: In progress    LONG TERM GOALS: Target date: 08/27/23   Patient to be independent with advanced HEP  Baseline:  Goal status: INITIAL  2.  Patient to report pain no greater than 2/10  Baseline:  Goal status: INITIAL  3.  Patient to be able to stand or walk for at least 15 min without rest Baseline:  Goal status: INITIAL  4.  LEFS score to improve by 3-5 points Baseline:  Goal status: INITIAL  5.  3 min walk test to improve by 100 feet Baseline:  Goal status: INITIAL  6.  Patient to be able to ascend and descend steps without confidence reported at 90%  Baseline:  Goal status: INITIAL   PLAN:  PT FREQUENCY: 1-2x/week  PT DURATION: 8 weeks  PLANNED INTERVENTIONS: 97110-Therapeutic exercises, 97530- Therapeutic activity, [id number]- Neuromuscular re-education, 97535- Self Care, [id number]- Manual therapy, [id number]- Gait training, [id number]- Canalith repositioning, [id number]- Aquatic Therapy, F6213- Electrical stimulation (unattended), [id number]- Electrical stimulation (manual), [id number]- Vasopneumatic device, [id number]- Ultrasound, [id number]- Traction (mechanical), [id number]- Ionotophoresis 4mg /ml Dexamethasone, Patient/Family education, Balance training, Stair training, Taping, Dry Needling, Joint mobilization, Spinal mobilization, Scar mobilization, Vestibular training, Visual/preceptual remediation/compensation, Cryotherapy, and Moist heat  PLAN FOR NEXT SESSION: Review HEP, Nustep, begin quad rehab and general LE strengthening.  Balance exercises.    Lorrene Reid,  PT 07/10/23 11:00 AM  [facility] [address] [place], [place] [address] Phone # [phone] Fax  [phone]

## 2023-07-11 ENCOUNTER — Telehealth: Payer: Self-pay

## 2023-07-11 NOTE — Telephone Encounter (Signed)
 Called and spoke with pt. I informed pt that the MRI order would need to come from her PT if they referred her to neurology. Pt understood and had no further questions.

## 2023-07-11 NOTE — Telephone Encounter (Signed)
 Copied from CRM [id number]. Topic: General - Other >> Jul 11, 2023 11:27 AM Fredrich Romans wrote: Reason for CRM: Patient called in stating that her Physcial Therapist is wanting her to see a neurologist,and her neurologist is wanting her to have a MRI before she sees them in May. Patient would like to know if Dr Claiborne Billings could place an order for her to have an MRI on her back.

## 2023-07-15 ENCOUNTER — Ambulatory Visit: Attending: Family Medicine

## 2023-07-15 DIAGNOSIS — R293 Abnormal posture: Secondary | ICD-10-CM | POA: Diagnosis present

## 2023-07-15 DIAGNOSIS — G8929 Other chronic pain: Secondary | ICD-10-CM | POA: Insufficient documentation

## 2023-07-15 DIAGNOSIS — M25562 Pain in left knee: Secondary | ICD-10-CM | POA: Diagnosis present

## 2023-07-15 DIAGNOSIS — H8113 Benign paroxysmal vertigo, bilateral: Secondary | ICD-10-CM | POA: Insufficient documentation

## 2023-07-15 DIAGNOSIS — R262 Difficulty in walking, not elsewhere classified: Secondary | ICD-10-CM | POA: Diagnosis present

## 2023-07-15 DIAGNOSIS — M170 Bilateral primary osteoarthritis of knee: Secondary | ICD-10-CM | POA: Insufficient documentation

## 2023-07-15 DIAGNOSIS — M6281 Muscle weakness (generalized): Secondary | ICD-10-CM | POA: Insufficient documentation

## 2023-07-15 DIAGNOSIS — R252 Cramp and spasm: Secondary | ICD-10-CM | POA: Diagnosis present

## 2023-07-15 NOTE — Therapy (Signed)
 OUTPATIENT PHYSICAL THERAPY TREATMENT   Patient Name: Kayla Rosales MRN: [id number] DOB:1955/11/11, 68 y.o., female Today's Date: 07/15/2023  END OF SESSION:  PT End of Session - 07/15/23 1015     Visit Number 5    Date for PT Re-Evaluation 08/13/23    Authorization Type Medicare A and B    Progress Note Due on Visit 10    PT Start Time [phone]    PT Stop Time 1015    PT Time Calculation (min) 38 min    Activity Tolerance Patient tolerated treatment well    Behavior During Therapy [facility] for tasks assessed/performed                Past Medical History:  Diagnosis Date   Allergy    Arthritis    Asthma    Back pain    Benign positional vertigo    Bilateral swelling of feet    Bronchitis    Chicken pox    Colon polyps    Constipation    Cough 10/09/2020   COVID-19 10/09/2020   Diverticulitis    Early menopause occurring in patient age younger than 45 years    Fibromyalgia    GERD (gastroesophageal reflux disease)    Gluten intolerance    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 Still unclear what is cuasing her pain. May be nerve related. May benefit from starting on gabapentin. Start gabapentin 300 mg TID. F/u 4 weeks   Night sweats    Osteopenia    Palpitations    Recurrent upper respiratory infection (URI)    Right arm weakness 09/28/2015   Last Assessment & Plan:  Formatting of this note might be different from the original. Referral to PT today. 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    Vertigo 08/11/2015   Last Assessment & Plan:  Formatting of this note might be different from the original. Concern given gradual onset over  the last week and a half in addition to neck pain. Patient needs neuroimaging to rule out dissecting vertebral aneurysm. Patient advised to go to emergency room. She declined ambulance transport   Vitamin B 12 deficiency    Vitamin D deficiency    Past Surgical History:  Procedure Laterality Date   CARPAL TUNNEL RELEASE     COLONOSCOPY  2015   Dr. Jean Rosenthal- [facility], [place]; 5 yr repeat recommended   COLONOSCOPY W/ POLYPECTOMY  05/26/2018   x5 polyps   KNEE ARTHROSCOPY Bilateral 2013   meniscus  x2   NECK SURGERY  2018   disc fusion   UPPER GASTROINTESTINAL ENDOSCOPY     Patient Active Problem List   Diagnosis Date Noted   BMI 30.0-30.9,adult 05/01/2023   Well woman exam with routine gynecological exam 04/30/2023   Vitamin D deficiency 01/21/2022   Other tear of medial meniscus, current injury, left knee, subsequent encounter 01/14/2022   Other adverse food reactions, not elsewhere classified, subsequent encounter 03/28/2021   Diastolic dysfunction 08/02/2019   Heart murmur 02/16/2019   Hyperlipidemia LDL goal <100 02/16/2019   Class 1 obesity with serious comorbidity and body mass  index (BMI) of 30.0 to 30.9 in adult 01/04/2019   Pelvic floor dysfunction in female 12/06/2018   Constipation due to outlet dysfunction 12/06/2018   Lactose intolerance 12/06/2018   Diverticulosis 05/27/2018   Osteopenia 01/19/2018   Irritable bowel syndrome with constipation 01/17/2018   History of colonic polyp 01/17/2018   History of stroke 01/15/2018   GERD (gastroesophageal reflux disease)    Asthma    S/P cervical spinal fusion 09/29/2017   Macrocytosis without anemia 11/25/2015   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   Heart palpitations 09/08/2015   Vitamin B12 deficiency 04/06/2014   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   Vestibular neuritis 10/15/2006   Family history of malignant neoplasm of breast 03/18/2006   Diaphragmatic hernia 03/18/2006   Premature menopause 03/18/2006     PCP: Natalia Leatherwood, DO  REFERRING PROVIDER: Natalia Leatherwood, DO  REFERRING DIAG: R29.898 (ICD-10-CM) - Weakness of both lower extremities  THERAPY DIAG:  Muscle weakness (generalized)  Difficulty in walking, not elsewhere classified  Cramp and spasm  Bilateral primary osteoarthritis of knee  Chronic pain of left knee  Rationale for Evaluation and Treatment: Rehabilitation  ONSET DATE: 06/12/2023  SUBJECTIVE:   SUBJECTIVE STATEMENT: I am stiff from doing a lot of driving over the weekend.    From eval: Patient reports bilateral leg weakness.  She has experienced off and on leg and bilateral knee pain for several years and understands that she has advanced bilateral knee OA.  She feels that her weakness is just more pronounced than just her knee issues.  She was always very active, playing softball and exercising on a regular basis.  She does take statins but never had any issues with pain and weakness related to this.  She is concerned about fall risk, especially when caring for her grandchildren.  She hopes to gain strength and be able to feel confident with routine daily activity and negotiating stairs.     PERTINENT HISTORY: Bilateral knee OA PAIN:  07/08/23 Are you having pain? Yes: NPRS scale: 6/10   Pain location: bilateral knees, hamstrings,  hips and low back Pain description: aching Aggravating factors: prolonged standing or walking/bending, stooping and squatting Relieving factors: rest  PRECAUTIONS: Fall  RED FLAGS: None   WEIGHT BEARING RESTRICTIONS: No  FALLS:  Has patient fallen in last 6 months? 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     NEXT MD VISIT: prn  OBJECTIVE:  Note: Objective measures were completed at Evaluation unless otherwise noted.  DIAGNOSTIC FINDINGS: na  PATIENT SURVEYS:  LEFS 29/80= 36.3%  COGNITION: Overall cognitive status: Within functional limits for tasks assessed     SENSATION: WFL   MUSCLE LENGTH: Hamstrings: Right 50 deg; Left 55 deg Thomas test: Right pos; Left pos  POSTURE: No Significant postural limitations  PALPATION: Crepitus bilateral patellofemoral with seated flexion/extension  LOWER EXTREMITY ROM:  WFL  LOWER EXTREMITY MMT:  Generally 4 to 4+/5 with exception of right hip abduction 4-,  right hip ER 4-,  left hip abd and ER4-/5.    LOWER EXTREMITY SPECIAL TESTS:  Knee special tests: Patellafemoral grind test: positive   FUNCTIONAL TESTS:  5 times sit to stand: complete next visit ; time constraints 3 minute walk test: complete next visit; time constraints  07/15/23: 3 min walk test:   GAIT: Distance walked: 30 feet Assistive device utilized: None Level of assistance: Complete Independence Comments: guarded                                                                                                                                TREATMENT DATE:   07/14/23  NuStep: Level 4x 7 minutes- PT present to discuss progress  Standing hamstring stretch 3 x 30 sec each LE Figure 4 sitting 3x20 seconds  Standing quad/hip flexor stretch 3 x 30 sec each LE Sit to stand with balance pad: 2x10 3 min walk test: 622 feet  Step up on balance pad 2x10 bil Leg press: seat 6, 80# 2x10 Standing rockerboard:  x 23 minutes 5# kettlebell in Farmer's carry: alternating step taps on 6" step x20 Rt and Lt  07/10/23  NuStep: Level 4x 7 minutes- PT present to discuss progress  Standing hamstring stretch 3 x 30 sec each LE Figure 4 sitting 3x20 seconds  Standing quad/hip flexor stretch 3 x 30 sec each LE Sit to stand with balance pad: 5# x15 Squat to table with balance pad x 10 supine clam x 20 with yellow loop Supine bridge x 20 Standing rockerboard: x 2 minutes 5# kettlebell in Farmer's carry: alternating step taps on 4" stepx20 07/08/23  NuStep: Level 4x 7 minutes- PT present to discuss progress  Standing hamstring stretch 3 x 30 sec each LE Standing quad/hip flexor stretch 3 x 30 sec each LE Sit to stand x 10 Squat to table with balance pad x 10 Seated clam x 20 with yellow loop Supine bridge x 20 PPT x 20 Discussion about pain control and how to manage back pain.  Suggested possible consult with neurosurgeon since she has not had any evaluation of her back symptoms in several years and she has hx of HNP.    PATIENT EDUCATION:  Education details: Initiated HEP, dry needling info Person educated: Patient Education method: Explanation, Demonstration, Verbal cues, and Handouts Education comprehension: verbalized understanding, returned demonstration, and verbal cues required  HOME EXERCISE PROGRAM: Access Code: [email or web address] URL: [email or web address] Date: 07/03/2023 Prepared by: Tresa Endo  Exercises - Standing Hamstring Stretch on Chair  - 1 x daily - 7 x weekly - 1 sets - 3 reps - 30 sec hold - Quadricep Stretch with Chair and Counter Support  - 1 x daily - 7 x weekly - 1 sets - 3 reps - 30 sec hold - Seated Figure 4 Piriformis Stretch  - 1 x daily - 7 x weekly - 1 sets -  3 reps - 30 sed hold - Sit to Stand Without Arm Support  - 1 x daily - 7 x weekly - 2 sets - 10 reps - Sidelying Reverse Clamshell  - 1 x daily - 7 x weekly - 2 sets - 10 reps - Clamshell  - 1 x daily - 7 x  weekly - 2 sets - 10 reps  ASSESSMENT:  CLINICAL IMPRESSION: Pt is independent with HEP and reports moderate compliance.  Established baseline for 3 min walk test.  Advanced exercises in the clinic today. PT monitored pt throughout session.  She would benefit from continuing skilled PT for bilateral LE flexibility exercises and strengthening along with balance and gait training.   OBJECTIVE IMPAIRMENTS: decreased activity tolerance, decreased balance, decreased endurance, decreased knowledge of condition, decreased mobility, difficulty walking, decreased strength, increased muscle spasms, impaired flexibility, and pain.   ACTIVITY LIMITATIONS: carrying, lifting, bending, standing, squatting, sleeping, stairs, transfers, dressing, and caring for others  PARTICIPATION LIMITATIONS: meal prep, cleaning, laundry, driving, shopping, community activity, occupation, yard work, and church  PERSONAL FACTORS: Fitness, Past/current experiences, and 1-2 comorbidities: asthma, hx stroke, osteopenia  are also affecting patient's functional outcome.   REHAB POTENTIAL: Fair multiple co-morbidities  CLINICAL DECISION MAKING: Evolving/moderate complexity  EVALUATION COMPLEXITY: Moderate   GOALS: Goals reviewed with patient? Yes  SHORT TERM GOALS: Target date: 07/16/2023  Patient will be independent with initial HEP  Baseline: Goal status: MET  2.  Pain report to be no greater than 4/10  Baseline: 6/10 (07/10/23) Goal status: In progress    LONG TERM GOALS: Target date: 08/27/23   Patient to be independent with advanced HEP  Baseline:  Goal status: INITIAL  2.  Patient to report pain no greater than 2/10  Baseline:  Goal status: INITIAL  3.  Patient to be able to stand or walk for at least 15 min without rest Baseline:  Goal status: INITIAL  4.  LEFS score to improve by 3-5 points Baseline:  Goal status: INITIAL  5.  3 min walk test to improve by 100 feet Baseline:  Goal status:  INITIAL  6.  Patient to be able to ascend and descend steps without confidence reported at 90%  Baseline:  Goal status: INITIAL   PLAN:  PT FREQUENCY: 1-2x/week  PT DURATION: 8 weeks  PLANNED INTERVENTIONS: 97110-Therapeutic exercises, 97530- Therapeutic activity, [id number]- Neuromuscular re-education, 97535- Self Care, 13086- Manual therapy, [id number]- Gait training, [phone]- Canalith repositioning, [id number]- Aquatic Therapy, N6295- Electrical stimulation (unattended), [id number]- Electrical stimulation (manual), [id number]- Vasopneumatic device, [id number]- Ultrasound, [id number]- Traction (mechanical), [id number]- Ionotophoresis 4mg /ml Dexamethasone, Patient/Family education, Balance training, Stair training, Taping, Dry Needling, Joint mobilization, Spinal mobilization, Scar mobilization, Vestibular training, Visual/preceptual remediation/compensation, Cryotherapy, and Moist heat  PLAN FOR NEXT SESSION: Review HEP, Nustep, begin quad rehab and general LE strengthening.  Balance exercises.    Lorrene Reid, PT 07/15/23 10:16 AM  [facility] [address], [address] [place], [place] [address] Phone # [phone] Fax [phone]

## 2023-07-17 ENCOUNTER — Ambulatory Visit

## 2023-07-17 DIAGNOSIS — R252 Cramp and spasm: Secondary | ICD-10-CM

## 2023-07-17 DIAGNOSIS — M6281 Muscle weakness (generalized): Secondary | ICD-10-CM

## 2023-07-17 DIAGNOSIS — M170 Bilateral primary osteoarthritis of knee: Secondary | ICD-10-CM

## 2023-07-17 DIAGNOSIS — R293 Abnormal posture: Secondary | ICD-10-CM

## 2023-07-17 DIAGNOSIS — R262 Difficulty in walking, not elsewhere classified: Secondary | ICD-10-CM

## 2023-07-17 DIAGNOSIS — G8929 Other chronic pain: Secondary | ICD-10-CM

## 2023-07-17 NOTE — Therapy (Signed)
 OUTPATIENT PHYSICAL THERAPY TREATMENT   Patient Name: Kayla Rosales MRN: [id number] DOB:02/21/1956, 68 y.o., female Today's Date: 07/17/2023  END OF SESSION:  PT End of Session - 07/17/23 0932     Visit Number 6    Date for PT Re-Evaluation 08/13/23    Authorization Type Medicare A and B    Progress Note Due on Visit 10    PT Start Time 0932    PT Stop Time 1012    PT Time Calculation (min) 40 min    Activity Tolerance Patient tolerated treatment well    Behavior During Therapy [facility] for tasks assessed/performed                Past Medical History:  Diagnosis Date   Allergy    Arthritis    Asthma    Back pain    Benign positional vertigo    Bilateral swelling of feet    Bronchitis    Chicken pox    Colon polyps    Constipation    Cough 10/09/2020   COVID-19 10/09/2020   Diverticulitis    Early menopause occurring in patient age younger than 45 years    Fibromyalgia    GERD (gastroesophageal reflux disease)    Gluten intolerance    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 Still unclear what is cuasing her pain. May be nerve related. May benefit from starting on gabapentin. Start gabapentin 300 mg TID. F/u 4 weeks   Night sweats    Osteopenia    Palpitations    Recurrent upper respiratory infection (URI)    Right arm weakness 09/28/2015   Last Assessment & Plan:  Formatting of this note might be different from the original. Referral to PT today. 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    Vertigo 08/11/2015   Last Assessment & Plan:  Formatting of this note might be different from the original. Concern given gradual onset over  the last week and a half in addition to neck pain. Patient needs neuroimaging to rule out dissecting vertebral aneurysm. Patient advised to go to emergency room. She declined ambulance transport   Vitamin B 12 deficiency    Vitamin D deficiency    Past Surgical History:  Procedure Laterality Date   CARPAL TUNNEL RELEASE     COLONOSCOPY  2015   Dr. Jean Rosenthal- [facility], [place]; 5 yr repeat recommended   COLONOSCOPY W/ POLYPECTOMY  05/26/2018   x5 polyps   KNEE ARTHROSCOPY Bilateral 2013   meniscus  x2   NECK SURGERY  2018   disc fusion   UPPER GASTROINTESTINAL ENDOSCOPY     Patient Active Problem List   Diagnosis Date Noted   BMI 30.0-30.9,adult 05/01/2023   Well woman exam with routine gynecological exam 04/30/2023   Vitamin D deficiency 01/21/2022   Other tear of medial meniscus, current injury, left knee, subsequent encounter 01/14/2022   Other adverse food reactions, not elsewhere classified, subsequent encounter 03/28/2021   Diastolic dysfunction 08/02/2019   Heart murmur 02/16/2019   Hyperlipidemia LDL goal <100 02/16/2019   Class 1 obesity with serious comorbidity and body mass  index (BMI) of 30.0 to 30.9 in adult 01/04/2019   Pelvic floor dysfunction in female 12/06/2018   Constipation due to outlet dysfunction 12/06/2018   Lactose intolerance 12/06/2018   Diverticulosis 05/27/2018   Osteopenia 01/19/2018   Irritable bowel syndrome with constipation 01/17/2018   History of colonic polyp 01/17/2018   History of stroke 01/15/2018   GERD (gastroesophageal reflux disease)    Asthma    S/P cervical spinal fusion 09/29/2017   Macrocytosis without anemia 11/25/2015   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   Heart palpitations 09/08/2015   Vitamin B12 deficiency 04/06/2014   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   Vestibular neuritis 10/15/2006   Family history of malignant neoplasm of breast 03/18/2006   Diaphragmatic hernia 03/18/2006   Premature menopause 03/18/2006     PCP: Natalia Leatherwood, DO  REFERRING PROVIDER: Natalia Leatherwood, DO  REFERRING DIAG: R29.898 (ICD-10-CM) - Weakness of both lower extremities  THERAPY DIAG:  Muscle weakness (generalized)  Difficulty in walking, not elsewhere classified  Cramp and spasm  Bilateral primary osteoarthritis of knee  Chronic pain of left knee  Abnormal posture  Rationale for Evaluation and Treatment: Rehabilitation  ONSET DATE: 06/12/2023  SUBJECTIVE:   SUBJECTIVE STATEMENT: My back has been doing great.  I'm sleeping good but now my left knee has been a little achy.  I think it was the leg press we did because I remember it feeling a little achy right after.    From eval: Patient reports bilateral leg weakness.  She has experienced off and on leg and bilateral knee pain for several years and understands that she has advanced bilateral knee OA.  She feels that her weakness is just more pronounced than just her knee issues.  She was always very active, playing softball and exercising on a regular basis.  She does take statins but never had any issues with pain and weakness related to this.  She is concerned about fall risk, especially when caring for her grandchildren.  She hopes to gain strength and be able to feel confident with routine daily activity and negotiating stairs.     PERTINENT HISTORY: Bilateral knee OA PAIN:  07/08/23 Are you having pain? Yes: NPRS scale: 6/10   Pain location: bilateral knees, hamstrings,  hips and low back Pain description: aching Aggravating factors: prolonged standing or walking/bending, stooping and squatting Relieving factors: rest  PRECAUTIONS: Fall  RED FLAGS: None   WEIGHT BEARING RESTRICTIONS: No  FALLS:  Has patient fallen in last 6 months? 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     NEXT MD VISIT: prn  OBJECTIVE:  Note: Objective measures were completed at Evaluation unless otherwise noted.  DIAGNOSTIC FINDINGS: na  PATIENT SURVEYS:  LEFS 29/80= 36.3%  COGNITION: Overall cognitive status: Within functional limits for tasks assessed     SENSATION: WFL   MUSCLE LENGTH: Hamstrings: Right 50 deg; Left 55 deg  Thomas test: Right pos; Left pos  POSTURE: No Significant postural limitations  PALPATION: Crepitus bilateral patellofemoral with seated flexion/extension  LOWER EXTREMITY ROM:  WFL  LOWER EXTREMITY MMT:  Generally 4 to 4+/5 with exception of right hip abduction 4-,  right hip ER 4-,  left hip abd and ER4-/5.    LOWER EXTREMITY SPECIAL TESTS:  Knee special tests: Patellafemoral grind test: positive   FUNCTIONAL TESTS:  5 times sit to stand: complete next visit ; time constraints 3 minute walk test: complete next visit; time constraints  07/15/23: 3 min walk test:   GAIT: Distance walked: 30 feet Assistive device utilized: None Level of assistance: Complete Independence Comments: guarded                                                                                                                                TREATMENT DATE:  07/17/23  Recumbent bike x 5 min PT present to discuss progress  Standing hamstring stretch 3 x 30 sec each LE Standing quad/hip flexor stretch 3 x 30 sec each LE Figure 4 sitting 3x20 seconds  Standing  adductor stretch x 3 on each side holding 10 seconds Sit to stand with balance pad: x 10 Squat to balance pad on mat table x 10 Step ups 2 x 10 each LE on 6" step Lateral band walks x 3 laps of 20 feet (left hip with significant weakness) Lateral heel tap off 4 inch step 2 x 10 each Le   07/14/23  NuStep: Level 4x 7 minutes- PT present to discuss progress  Standing hamstring stretch 3 x 30 sec each LE Figure 4 sitting 3x20 seconds  Standing quad/hip flexor stretch 3 x 30 sec each LE Sit to stand with balance pad: 2x10 3 min walk test: 622 feet  Step up on balance pad 2x10 bil Leg press: seat 6, 80# 2x10 Standing rockerboard: x 23 minutes 5# kettlebell in Farmer's carry: alternating step taps on 6" step x20 Rt and Lt  07/10/23  NuStep: Level 4x 7 minutes- PT present to discuss progress  Standing hamstring stretch 3 x 30 sec each LE Figure 4 sitting 3x20 seconds  Standing quad/hip flexor stretch 3 x 30 sec each LE Sit to stand with balance pad: 5# x15 Squat to table with balance pad x 10 supine clam x 20 with yellow loop Supine bridge x 20 Standing rockerboard: x 2 minutes 5# kettlebell in Farmer's carry: alternating step taps on 4" stepx20  PATIENT EDUCATION:  Education details: Initiated HEP, dry needling info Person educated: Patient Education method: Programmer, multimedia, Demonstration, Verbal cues, and Handouts Education comprehension: verbalized understanding, returned demonstration, and verbal cues required  HOME EXERCISE PROGRAM: Access Code: [email or web address] URL: [email or web address] Date: 07/03/2023 Prepared by: Tresa Endo  Exercises - Standing Hamstring Stretch on Chair  - 1 x daily - 7 x weekly - 1 sets - 3 reps - 30 sec hold - Theatre manager with Chair and Counter Support  -  1 x daily - 7 x weekly - 1 sets - 3 reps - 30 sec hold - Seated Figure 4 Piriformis Stretch  - 1 x daily - 7 x weekly - 1 sets - 3 reps - 30 sed hold - Sit to Stand Without Arm Support  - 1 x  daily - 7 x weekly - 2 sets - 10 reps - Sidelying Reverse Clamshell  - 1 x daily - 7 x weekly - 2 sets - 10 reps - Clamshell  - 1 x daily - 7 x weekly - 2 sets - 10 reps  ASSESSMENT:  CLINICAL IMPRESSION: Sabree is progressing appropriately.  She had some mild knee pain after last visit but this was only minimally reproduced today.  She is compliant and well motivated.  She should continue to do well.    She would benefit from continuing skilled PT for bilateral LE flexibility exercises and strengthening along with balance and gait training.   OBJECTIVE IMPAIRMENTS: decreased activity tolerance, decreased balance, decreased endurance, decreased knowledge of condition, decreased mobility, difficulty walking, decreased strength, increased muscle spasms, impaired flexibility, and pain.   ACTIVITY LIMITATIONS: carrying, lifting, bending, standing, squatting, sleeping, stairs, transfers, dressing, and caring for others  PARTICIPATION LIMITATIONS: meal prep, cleaning, laundry, driving, shopping, community activity, occupation, yard work, and church  PERSONAL FACTORS: Fitness, Past/current experiences, and 1-2 comorbidities: asthma, hx stroke, osteopenia  are also affecting patient's functional outcome.   REHAB POTENTIAL: Fair multiple co-morbidities  CLINICAL DECISION MAKING: Evolving/moderate complexity  EVALUATION COMPLEXITY: Moderate   GOALS: Goals reviewed with patient? Yes  SHORT TERM GOALS: Target date: 07/16/2023  Patient will be independent with initial HEP  Baseline: Goal status: MET  2.  Pain report to be no greater than 4/10  Baseline: 6/10 (07/10/23) Goal status: In progress    LONG TERM GOALS: Target date: 08/27/23   Patient to be independent with advanced HEP  Baseline:  Goal status: INITIAL  2.  Patient to report pain no greater than 2/10  Baseline:  Goal status: INITIAL  3.  Patient to be able to stand or walk for at least 15 min without rest Baseline:  Goal  status: INITIAL  4.  LEFS score to improve by 3-5 points Baseline:  Goal status: INITIAL  5.  3 min walk test to improve by 100 feet Baseline:  Goal status: INITIAL  6.  Patient to be able to ascend and descend steps without confidence reported at 90%  Baseline:  Goal status: INITIAL   PLAN:  PT FREQUENCY: 1-2x/week  PT DURATION: 8 weeks  PLANNED INTERVENTIONS: 97110-Therapeutic exercises, 97530- Therapeutic activity, [id number]- Neuromuscular re-education, 97535- Self Care, [id number]- Manual therapy, [id number]- Gait training, [phone]- Canalith repositioning, [id number]- Aquatic Therapy, [phone]- Electrical stimulation (unattended), [id number]- Electrical stimulation (manual), [id number]- Vasopneumatic device, [id number]- Ultrasound, [id number]- Traction (mechanical), [id number]- Ionotophoresis 4mg /ml Dexamethasone, Patient/Family education, Balance training, Stair training, Taping, Dry Needling, Joint mobilization, Spinal mobilization, Scar mobilization, Vestibular training, Visual/preceptual remediation/compensation, Cryotherapy, and Moist heat  PLAN FOR NEXT SESSION:  Nustep, progress quad rehab and general LE strengthening.  Balance exercises.    Victorino Dike B. Akanksha Bellmore, PT 07/17/23 10:19 AM [facility] [address], [address] [place], [place] [address] Phone # [phone] Fax [phone]

## 2023-07-22 ENCOUNTER — Ambulatory Visit

## 2023-07-24 ENCOUNTER — Ambulatory Visit: Admitting: Rehabilitative and Restorative Service Providers"

## 2023-07-24 ENCOUNTER — Encounter: Payer: Self-pay | Admitting: Rehabilitative and Restorative Service Providers"

## 2023-07-24 DIAGNOSIS — M170 Bilateral primary osteoarthritis of knee: Secondary | ICD-10-CM

## 2023-07-24 DIAGNOSIS — R252 Cramp and spasm: Secondary | ICD-10-CM

## 2023-07-24 DIAGNOSIS — M6281 Muscle weakness (generalized): Secondary | ICD-10-CM | POA: Diagnosis not present

## 2023-07-24 DIAGNOSIS — R262 Difficulty in walking, not elsewhere classified: Secondary | ICD-10-CM

## 2023-07-24 DIAGNOSIS — H8113 Benign paroxysmal vertigo, bilateral: Secondary | ICD-10-CM

## 2023-07-24 DIAGNOSIS — G8929 Other chronic pain: Secondary | ICD-10-CM

## 2023-07-24 NOTE — Therapy (Signed)
 OUTPATIENT PHYSICAL THERAPY VESTIBULAR REASSESSMENT NOTE   Patient Name: Kayla Rosales MRN: [id number] DOB:Jun 16, 1955, 68 y.o., female Today's Date: 07/24/2023   Progress Note Reporting Period 06/18/2023 to 07/24/2023  See note below for Objective Data and Assessment of Progress/Goals.      END OF SESSION:  PT End of Session - 07/24/23 0939     Visit Number 7    Date for PT Re-Evaluation 09/26/23    Authorization Type Medicare A and B    Progress Note Due on Visit 17    PT Start Time 0933    PT Stop Time 1017    PT Time Calculation (min) 44 min    Activity Tolerance Patient tolerated treatment well    Behavior During Therapy [facility] for tasks assessed/performed                Past Medical History:  Diagnosis Date   Allergy    Arthritis    Asthma    Back pain    Benign positional vertigo    Bilateral swelling of feet    Bronchitis    Chicken pox    Colon polyps    Constipation    Cough 10/09/2020   COVID-19 10/09/2020   Diverticulitis    Early menopause occurring in patient age younger than 45 years    Fibromyalgia    GERD (gastroesophageal reflux disease)    Gluten intolerance    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 Still unclear what is cuasing her pain. May be nerve related. May benefit from starting on gabapentin. Start gabapentin 300 mg TID. F/u 4 weeks   Night sweats    Osteopenia    Palpitations    Recurrent upper respiratory infection (URI)    Right arm weakness 09/28/2015   Last Assessment & Plan:  Formatting of this note might be different from the original. Referral to PT today. Suspect residual after stroke in left basal ganglia   Sleep apnea    Snoring 01/04/2019   Stroke (cerebrum) (HCC) 2017   right UE/neck sx,  resolved.    Vertigo 08/11/2015   Last Assessment & Plan:  Formatting of this note might be different from the original. Concern given gradual onset over the last week and a half in addition to neck pain. Patient needs neuroimaging to rule out dissecting vertebral aneurysm. Patient advised to go to emergency room. She declined ambulance transport   Vitamin B 12 deficiency    Vitamin D deficiency    Past Surgical History:  Procedure Laterality Date   CARPAL TUNNEL RELEASE     COLONOSCOPY  2015   Dr. Jean Rosenthal- [facility], [place]; 5 yr repeat recommended   COLONOSCOPY W/ POLYPECTOMY  05/26/2018   x5 polyps   KNEE ARTHROSCOPY Bilateral 2013   meniscus  x2   NECK SURGERY  2018   disc fusion   UPPER GASTROINTESTINAL ENDOSCOPY     Patient Active Problem List   Diagnosis Date Noted   BMI 30.0-30.9,adult 05/01/2023   Well woman exam with routine gynecological exam 04/30/2023   Vitamin D deficiency 01/21/2022   Other tear of medial meniscus, current injury, left knee, subsequent encounter 01/14/2022   Other adverse food reactions, not elsewhere classified, subsequent encounter 03/28/2021  Diastolic dysfunction 08/02/2019   Heart murmur 02/16/2019   Hyperlipidemia LDL goal <100 02/16/2019   Class 1 obesity with serious comorbidity and body mass index (BMI) of 30.0 to 30.9 in adult 01/04/2019   Pelvic floor dysfunction in female 12/06/2018   Constipation due to outlet dysfunction 12/06/2018   Lactose intolerance 12/06/2018   Diverticulosis 05/27/2018   Osteopenia 01/19/2018   Irritable bowel syndrome with constipation 01/17/2018   History of colonic polyp 01/17/2018   History of stroke 01/15/2018   GERD (gastroesophageal reflux disease)    Asthma    S/P cervical spinal fusion 09/29/2017   Macrocytosis without anemia 11/25/2015   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   Heart palpitations 09/08/2015   Vitamin B12 deficiency 04/06/2014   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    Vestibular neuritis 10/15/2006   Family history of malignant neoplasm of breast 03/18/2006   Diaphragmatic hernia 03/18/2006   Premature menopause 03/18/2006    PCP: Natalia Leatherwood, DO  REFERRING PROVIDER: Natalia Leatherwood, DO  REFERRING DIAG: R29.898 (ICD-10-CM) - Weakness of both lower extremities  THERAPY DIAG:  Muscle weakness (generalized) - Plan: PT plan of care cert/re-cert  Cramp and spasm - Plan: PT plan of care cert/re-cert  Difficulty in walking, not elsewhere classified - Plan: PT plan of care cert/re-cert  Bilateral primary osteoarthritis of knee - Plan: PT plan of care cert/re-cert  Chronic pain of left knee - Plan: PT plan of care cert/re-cert  BPPV (benign paroxysmal positional vertigo), bilateral - Plan: PT plan of care cert/re-cert  Rationale for Evaluation and Treatment: Rehabilitation  ONSET DATE: 06/12/2023  SUBJECTIVE:   SUBJECTIVE STATEMENT: Patient reports that her pain is still a 6/10.  States that her dizziness is intermittent, but worse at night when she lies down.  Reports that she is unable to roll from her left side to her right side without increased dizziness.  PERTINENT HISTORY: Bilateral knee OA PAIN:  Are you having pain? Yes: NPRS scale: 6/10   Pain location: bilateral knees, hamstrings,  hips and low back Pain description: aching Aggravating factors: prolonged standing or walking/bending, stooping and squatting Relieving factors: rest  PRECAUTIONS: Fall  RED FLAGS: None   WEIGHT BEARING RESTRICTIONS: No  FALLS:  Has patient fallen in last 6 months? 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     NEXT MD VISIT: prn  OBJECTIVE:  Note: Objective measures were completed at Evaluation unless otherwise noted.  DIAGNOSTIC FINDINGS: na  PATIENT SURVEYS:  Eval:  LEFS 29/80= 36.3%  07/24/2023: Dizziness Handicapped Inventory: Total Score: 24 / 100 Lower Extremity Functional Score: 33 / 80 = 41.3 %  COGNITION: Overall cognitive status: Within functional limits for tasks assessed     SENSATION: WFL   MUSCLE LENGTH: Hamstrings: Right 50 deg; Left 55 deg Thomas test: Right pos; Left pos  POSTURE: No Significant postural limitations  PALPATION: Crepitus bilateral patellofemoral with seated  flexion/extension  LOWER EXTREMITY ROM:  WFL  LOWER EXTREMITY MMT:  Generally 4 to 4+/5 with exception of right hip abduction 4-,  right hip ER 4-,  left hip abd and ER4-/5.    LOWER EXTREMITY SPECIAL TESTS:  Knee special tests: Patellafemoral grind test: positive   FUNCTIONAL TESTS:   07/24/2023:  3 min walk test: 668 ft with a slight pulling of right hip 5 times sit to/from stand:  13.92 sec with hands pressing up through thigh  GAIT: Distance walked: 30 feet Assistive device utilized: None Level of assistance: Complete Independence Comments: guarded  VESTIBULAR ASSESSMENT: 07/24/2023: Smooth Pursuit is intact Gaze Stabilization is intact Saccades are intact Head Impulse Test is intact Gilberto Better is negative on the left and positive on the right                                                                                                                               TREATMENT DATE:  07/24/2023: NuStep: Level 5 x 7 minutes- PT present  to discuss progress Lower Extremity Functional Scale and Dizziness Handicapped Inventory 3 minute walk test Sit to/from stand x5 Vestibular Assessment (see above) Gilberto Better: negative on the left, positive on the right Epley Maneuver x2 to treat right side    07/17/23  Recumbent bike x 5 min PT present to discuss progress  Standing hamstring stretch 3 x 30 sec each LE Standing quad/hip flexor stretch 3 x 30 sec each LE Figure 4 sitting 3x20 seconds  Standing adductor stretch x 3 on each side holding 10 seconds Sit to stand with balance pad: x 10 Squat to balance pad on mat table x 10 Step ups 2 x 10 each LE on 6" step Lateral band walks x 3 laps of 20 feet (left hip with significant weakness) Lateral heel tap off 4 inch step 2 x 10 each Le   07/14/23  NuStep: Level 4x 7 minutes- PT present to discuss progress  Standing hamstring stretch 3 x 30 sec each LE Figure 4 sitting 3x20 seconds  Standing quad/hip flexor stretch 3 x 30 sec each LE Sit to stand with balance pad: 2x10 3 min walk test: 622 feet  Step up on balance pad 2x10 bil Leg press: seat 6, 80# 2x10 Standing rockerboard: x 23 minutes 5# kettlebell in Farmer's carry: alternating step taps on 6" step x20 Rt and Lt   PATIENT EDUCATION:  Education details: Initiated HEP, dry needling info Person educated: Patient Education method: Programmer, multimedia, Facilities manager, Verbal cues, and Handouts Education comprehension: verbalized understanding, returned demonstration, and verbal cues required  HOME EXERCISE PROGRAM: Access Code: [email or web address] URL: [email or web address] Date: 07/24/2023 Prepared by: Clydie Braun Jarquez Mestre  Exercises - Standing Hamstring Stretch on Chair  - 1 x daily - 7 x weekly - 1 sets - 3 reps - 30 sec hold - Quadricep Stretch with Chair and Counter Support  - 1 x daily - 7 x weekly - 1 sets -  3 reps - 30 sec hold - Seated Figure 4 Piriformis Stretch  - 1 x daily - 7 x weekly - 1 sets - 3 reps - 30 sed  hold - Sit to Stand Without Arm Support  - 1 x daily - 7 x weekly - 2 sets - 10 reps - Sidelying Reverse Clamshell  - 1 x daily - 7 x weekly - 2 sets - 10 reps - Clamshell  - 1 x daily - 7 x weekly - 2 sets - 10 reps - Hooklying Active Hamstring Stretch  - 1 x daily - 7 x weekly - 1-2 sets - 10 reps - Self-Epley Maneuver Right Ear  - 1 x daily - 7 x weekly - 1-2 reps  ASSESSMENT:  CLINICAL IMPRESSION: Ms Rosen presents to skilled PT with reports of dizziness.  She states that she is primarily having dizziness when she rolls towards her right side.  Patient able to undergo a vestibular assessment and was unremarkable except that she had a positive Dix Hallpike on the right side.  Able to proceed with canalith repositioning x2 with patient reporting minimal to no symptoms on the 2nd maneuver.  Patient able to perform baseline measurements for 3 min walk and 5 times sit to stand.  Patient continues to have functional LE weakness and unable to perform 5 sit to stands without pressing up from thighs with her hands.  Patient would benefit from continued skilled PT of 1-2x/week for 2 more months to progress towards goal related activities with decreased pain and decreased dizziness.  OBJECTIVE IMPAIRMENTS: decreased activity tolerance, decreased balance, decreased endurance, decreased knowledge of condition, decreased mobility, difficulty walking, decreased strength, dizziness, increased muscle spasms, impaired flexibility, and pain.   ACTIVITY LIMITATIONS: carrying, lifting, bending, standing, squatting, sleeping, stairs, transfers, dressing, and caring for others  PARTICIPATION LIMITATIONS: meal prep, cleaning, laundry, driving, shopping, community activity, occupation, yard work, and church  PERSONAL FACTORS: Fitness, Past/current experiences, and 1-2 comorbidities: asthma, hx stroke, osteopenia  are also affecting patient's functional outcome.   REHAB POTENTIAL: Fair multiple  co-morbidities  CLINICAL DECISION MAKING: Evolving/moderate complexity  EVALUATION COMPLEXITY: Moderate   GOALS: Goals reviewed with patient? Yes  SHORT TERM GOALS: Target date: 07/16/2023  Patient will be independent with initial HEP  Baseline: Goal status: MET  2.  Pain report to be no greater than 4/10  Baseline: 6/10 (07/10/23) Goal status: In progress    LONG TERM GOALS: Target date: 09/26/2023   Patient to be independent with advanced HEP to allow for self progression after discharge. Baseline:  Goal status: INITIAL  2.  Patient to report ability to walk for more than 15-20 minutes without increased pain.  Baseline:  Goal status: INITIAL  3.  Patient to be able to stand or walk for at least 15 min without rest Baseline:  Goal status: Ongoing  4.  Patient will increase Lower Extremity Functional Scale to at least 50% to demonstrate improved functional mobility Baseline: 36.3% Goal status: Ongoing (see above)  5.  3 min walk test to improve by 100 feet Baseline: 668 ft on 07/24/23 Goal status: INITIAL  6.  Patient to be able to ascend and descend steps with improved confidence and without a loss of balance and no increased dizziness. Baseline:  Goal status: Ongoing  7.  Patient to have a negative Gilberto Better and report dizziness has improved at least 80%.  Baseline:  positive on the right on 07/24/23  Goal status:  New on 07/24/23  PLAN:  PT FREQUENCY: 1-2x/week  PT DURATION: 10 weeks  PLANNED INTERVENTIONS: [id number]- PT Re-evaluation, 97110-Therapeutic exercises, 97530- Therapeutic activity, [id number]- Neuromuscular re-education, 97535- Self Care, [id number]- Manual therapy, [id number]- Gait training, [phone]- Canalith repositioning, [id number]- Aquatic Therapy, [phone]- Electrical stimulation (unattended), [phone]- Electrical stimulation (manual), [id number]- Vasopneumatic device, [id number]- Ultrasound, [id number]- Traction (mechanical), [id number]- Ionotophoresis 4mg /ml Dexamethasone, Patient/Family  education, Balance training, Stair training, Taping, Dry Needling, Joint mobilization, Spinal mobilization, Scar mobilization, Vestibular training, Visual/preceptual remediation/compensation, Cryotherapy, and Moist heat  PLAN FOR NEXT SESSION:  Nustep, progress quad rehab and general LE strengthening.  Balance exercises.      Reather Laurence, PT, DPT 07/24/23, 11:30 AM  [facility] [address] [place], [place] [address] Phone # [phone] Fax [phone]

## 2023-07-29 ENCOUNTER — Ambulatory Visit

## 2023-07-29 DIAGNOSIS — R252 Cramp and spasm: Secondary | ICD-10-CM

## 2023-07-29 DIAGNOSIS — M170 Bilateral primary osteoarthritis of knee: Secondary | ICD-10-CM

## 2023-07-29 DIAGNOSIS — R293 Abnormal posture: Secondary | ICD-10-CM

## 2023-07-29 DIAGNOSIS — M6281 Muscle weakness (generalized): Secondary | ICD-10-CM | POA: Diagnosis not present

## 2023-07-29 DIAGNOSIS — G8929 Other chronic pain: Secondary | ICD-10-CM

## 2023-07-29 DIAGNOSIS — R262 Difficulty in walking, not elsewhere classified: Secondary | ICD-10-CM

## 2023-07-29 NOTE — Therapy (Signed)
 OUTPATIENT PHYSICAL THERAPY VESTIBULAR REASSESSMENT NOTE   Patient Name: Kayla Rosales MRN: [id number] DOB:[birth year], 68 y.o., female Today's Date: 07/29/2023    END OF SESSION:  PT End of Session - 07/29/23 1107     Visit Number 8    Date for PT Re-Evaluation 09/26/23    Authorization Type Medicare A and B    Progress Note Due on Visit 17    PT Start Time 1106    PT Stop Time 1145    PT Time Calculation (min) 39 min    Activity Tolerance Patient tolerated treatment well    Behavior During Therapy WFL for tasks assessed/performed                Past Medical History:  Diagnosis Date   Allergy    Arthritis    Asthma    Back pain    Benign positional vertigo    Bilateral swelling of feet    Bronchitis    Chicken pox    Colon polyps    Constipation    Cough 10/09/2020   COVID-19 10/09/2020   Diverticulitis    Early menopause occurring in patient age younger than 45 years    Fibromyalgia    GERD (gastroesophageal reflux disease)    Gluten intolerance    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 Still unclear what is cuasing her pain. May be nerve related. May benefit from starting on gabapentin. Start gabapentin 300 mg TID. F/u 4 weeks   Night sweats    Osteopenia    Palpitations    Recurrent upper respiratory infection (URI)    Right arm weakness 09/28/2015   Last Assessment & Plan:  Formatting of this note might be different from the original. Referral to PT today. 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    Vertigo 08/11/2015   Last Assessment & Plan:  Formatting of this note might be different from the original. Concern  given gradual onset over the last week and a half in addition to neck pain. Patient needs neuroimaging to rule out dissecting vertebral aneurysm. Patient advised to go to emergency room. She declined ambulance transport   Vitamin B 12 deficiency    Vitamin D deficiency    Past Surgical History:  Procedure Laterality Date   CARPAL TUNNEL RELEASE     COLONOSCOPY  2015   Dr. Jean Rosenthal- [facility], [place]; 5 yr repeat recommended   COLONOSCOPY W/ POLYPECTOMY  05/26/2018   x5 polyps   KNEE ARTHROSCOPY Bilateral 2013   meniscus  x2   NECK SURGERY  2018   disc fusion   UPPER GASTROINTESTINAL ENDOSCOPY     Patient Active Problem List   Diagnosis Date Noted   BMI 30.0-30.9,adult 05/01/2023   Well woman exam with routine gynecological exam 04/30/2023   Vitamin D deficiency 01/21/2022   Other tear of medial meniscus, current injury, left knee, subsequent encounter 01/14/2022   Other adverse food reactions, not elsewhere classified, subsequent encounter 03/28/2021   Diastolic dysfunction 08/02/2019   Heart murmur 02/16/2019   Hyperlipidemia LDL goal <100 02/16/2019   Class 1 obesity with serious  comorbidity and body mass index (BMI) of 30.0 to 30.9 in adult 01/04/2019   Pelvic floor dysfunction in female 12/06/2018   Constipation due to outlet dysfunction 12/06/2018   Lactose intolerance 12/06/2018   Diverticulosis 05/27/2018   Osteopenia 01/19/2018   Irritable bowel syndrome with constipation 01/17/2018   History of colonic polyp 01/17/2018   History of stroke 01/15/2018   GERD (gastroesophageal reflux disease)    Asthma    S/P cervical spinal fusion 09/29/2017   Macrocytosis without anemia 11/25/2015   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   Heart palpitations 09/08/2015   Vitamin B12 deficiency 04/06/2014   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   Vestibular neuritis 10/15/2006   Family history of malignant neoplasm of breast 03/18/2006   Diaphragmatic hernia 03/18/2006    Premature menopause 03/18/2006    PCP: Natalia Leatherwood, DO  REFERRING PROVIDER: Natalia Leatherwood, DO  REFERRING DIAG: R29.898 (ICD-10-CM) - Weakness of both lower extremities  THERAPY DIAG:  Cramp and spasm  Difficulty in walking, not elsewhere classified  Muscle weakness (generalized)  Bilateral primary osteoarthritis of knee  Chronic pain of left knee  Abnormal posture  Rationale for Evaluation and Treatment: Rehabilitation  ONSET DATE: 06/12/2023  SUBJECTIVE:   SUBJECTIVE STATEMENT: Patient reports continued low back pain and hip pain.  Patient admits she is not doing her exercises consistently.  I feel like I'm so active so I don't do the exercises as much.    PERTINENT HISTORY: Bilateral knee OA PAIN:  Are you having pain? Yes: NPRS scale: 6/10   Pain location: bilateral knees, hamstrings,  hips and low back Pain description: aching Aggravating factors: prolonged standing or walking/bending, stooping and squatting Relieving factors: rest  PRECAUTIONS: Fall  RED FLAGS: None   WEIGHT BEARING RESTRICTIONS: No  FALLS:  Has patient fallen in last 6 months? 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     NEXT MD VISIT: prn  OBJECTIVE:  Note: Objective measures were completed at Evaluation unless  otherwise noted.  DIAGNOSTIC FINDINGS: na  PATIENT SURVEYS:  Eval:  LEFS 29/80= 36.3%  07/24/2023: Dizziness Handicapped Inventory: Total Score: 24 / 100 Lower Extremity Functional Score: 33 / 80 = 41.3 %  COGNITION: Overall cognitive status: Within functional limits for tasks assessed     SENSATION: WFL   MUSCLE LENGTH: Hamstrings: Right 50 deg; Left 55 deg Thomas test: Right pos; Left pos  POSTURE: No Significant postural limitations  PALPATION: Crepitus bilateral patellofemoral with seated flexion/extension  LOWER EXTREMITY ROM:  WFL  LOWER EXTREMITY MMT:  Generally 4 to 4+/5 with exception of right hip abduction 4-,  right hip ER 4-,  left hip abd and ER4-/5.    LOWER EXTREMITY SPECIAL TESTS:  Knee special tests: Patellafemoral grind test: positive   FUNCTIONAL TESTS:   07/24/2023:  3 min walk test: 668 ft with a slight pulling of right hip 5 times sit to/from stand:  13.92 sec with hands pressing up through thigh  GAIT: Distance walked: 30 feet Assistive device utilized: None Level of assistance: Complete Independence Comments: guarded  VESTIBULAR ASSESSMENT: 07/24/2023: Smooth Pursuit is intact Gaze Stabilization is intact Saccades are intact Head Impulse Test is intact Etta Heritage is negative on the left and positive on the right                                                                                                                               TREATMENT DATE:  07/29/23  Recumbent bike x 5 min PT present to discuss progress  Standing hamstring stretch 3 x 30 sec each LE Standing quad/hip flexor stretch 3 x 30 sec each LE Figure 4 sitting 3x20 seconds  PPT x 20 PPT with alternating heel taps x 20 PPT with 90/90 heel taps x 20 PPT with dying bug x 20 Lower trunk rotation x 20 PPT with SLR to shoulder ext using 5 lb dumbbell Education on how "being active" should not replace her HEP as the exercises are specific to her needs.      07/24/2023: NuStep: Level 5 x 7 minutes- PT present to discuss progress Lower Extremity Functional Scale and Dizziness Handicapped Inventory 3 minute walk test Sit to/from stand x5 Vestibular Assessment (see above) Etta Heritage: negative on the left, positive on the right Epley Maneuver x2 to treat right side    07/17/23  Recumbent bike x 5 min PT present to discuss progress  Standing hamstring stretch 3 x 30 sec each LE Standing quad/hip flexor stretch 3 x 30 sec each LE Figure 4 sitting 3x20 seconds  Standing adductor stretch x 3 on each side holding 10 seconds Sit to stand with balance pad: x 10 Squat to balance pad on mat table x 10 Step ups 2 x 10 each LE on 6" step Lateral band walks x 3 laps of 20 feet (left hip with significant weakness) Lateral heel tap off 4 inch step 2 x 10 each Le   PATIENT EDUCATION:  Education details: Initiated HEP, dry needling info Person educated: Patient Education method: Programmer, multimedia, Demonstration, Verbal cues, and Handouts Education comprehension: verbalized understanding, returned demonstration, and verbal cues required  HOME EXERCISE PROGRAM: Access Code: [email or web address] URL: [email or web address] Date: 07/24/2023 Prepared by: Chaneta Comer Menke  Exercises - Standing Hamstring Stretch on Chair  - 1 x daily - 7 x weekly - 1 sets - 3 reps - 30 sec hold - Quadricep Stretch with Chair and Counter Support  - 1 x daily - 7 x weekly - 1 sets - 3 reps - 30 sec hold - Seated Figure 4 Piriformis Stretch  - 1 x daily - 7 x weekly - 1 sets - 3 reps - 30 sed hold - Sit to Stand Without Arm Support  - 1 x daily - 7 x weekly - 2 sets - 10 reps - Sidelying Reverse Clamshell  - 1 x daily - 7 x  weekly - 2 sets - 10 reps - Clamshell  - 1 x daily - 7 x weekly - 2 sets - 10 reps - Hooklying Active Hamstring Stretch  - 1 x daily - 7 x weekly - 1-2 sets - 10 reps - Self-Epley Maneuver Right Ear  - 1 x daily - 7 x weekly - 1-2  reps  ASSESSMENT:  CLINICAL IMPRESSION: Montoya would likely be meeting more goals and pain level would be better controlled if she was more consistent with her HEP.  She is able to do mod level core strengthening today with no increase in pain.  She has good underlying strength but needs to be more diligent with her HEP.    Patient would benefit from continued skilled PT for LE flexibility and core strengthening to progress toward final goals.    OBJECTIVE IMPAIRMENTS: decreased activity tolerance, decreased balance, decreased endurance, decreased knowledge of condition, decreased mobility, difficulty walking, decreased strength, dizziness, increased muscle spasms, impaired flexibility, and pain.   ACTIVITY LIMITATIONS: carrying, lifting, bending, standing, squatting, sleeping, stairs, transfers, dressing, and caring for others  PARTICIPATION LIMITATIONS: meal prep, cleaning, laundry, driving, shopping, community activity, occupation, yard work, and church  PERSONAL FACTORS: Fitness, Past/current experiences, and 1-2 comorbidities: asthma, hx stroke, osteopenia  are also affecting patient's functional outcome.   REHAB POTENTIAL: Fair multiple co-morbidities  CLINICAL DECISION MAKING: Evolving/moderate complexity  EVALUATION COMPLEXITY: Moderate   GOALS: Goals reviewed with patient? Yes  SHORT TERM GOALS: Target date: 07/16/2023  Patient will be independent with initial HEP  Baseline: Goal status: MET  2.  Pain report to be no greater than 4/10  Baseline: 6/10 (07/10/23) Goal status: In progress    LONG TERM GOALS: Target date: 09/26/2023   Patient to be independent with advanced HEP to allow for self progression after discharge. Baseline:  Goal status: INITIAL  2.  Patient to report ability to walk for more than 15-20 minutes without increased pain.  Baseline:  Goal status: INITIAL  3.  Patient to be able to stand or walk for at least 15 min without rest Baseline:  Goal  status: Ongoing  4.  Patient will increase Lower Extremity Functional Scale to at least 50% to demonstrate improved functional mobility Baseline: 36.3% Goal status: Ongoing (see above)  5.  3 min walk test to improve by 100 feet Baseline: 668 ft on 07/24/23 Goal status: INITIAL  6.  Patient to be able to ascend and descend steps with improved confidence and without a loss of balance and no increased dizziness. Baseline:  Goal status: Ongoing  7.  Patient to have a negative Gilberto Better and report dizziness has improved at least 80%.  Baseline:  positive on the right on 07/24/23  Goal status:  New on 07/24/23   PLAN:  PT FREQUENCY: 1-2x/week  PT DURATION: 10 weeks  PLANNED INTERVENTIONS: 97164- PT Re-evaluation, 97110-Therapeutic exercises, 97530- Therapeutic activity, 97112- Neuromuscular re-education, 97535- Self Care, 81191- Manual therapy, [id number]- Gait training, [phone]- Canalith repositioning, [id number]- Aquatic Therapy, [phone]- Electrical stimulation (unattended), [phone]- Electrical stimulation (manual), [id number]- Vasopneumatic device, [id number]- Ultrasound, [id number]- Traction (mechanical), [id number]- Ionotophoresis 4mg /ml Dexamethasone, Patient/Family education, Balance training, Stair training, Taping, Dry Needling, Joint mobilization, Spinal mobilization, Scar mobilization, Vestibular training, Visual/preceptual remediation/compensation, Cryotherapy, and Moist heat  PLAN FOR NEXT SESSION:  Nustep, progress quad rehab and general LE strengthening.  Balance exercises.      Victorino Dike B. Dorethia Jeanmarie, PT 07/29/23 9:41 PM [facility] [address] [place], [place] [address]  Phone # [phone] Fax [phone]

## 2023-07-31 ENCOUNTER — Ambulatory Visit

## 2023-08-05 ENCOUNTER — Ambulatory Visit

## 2023-08-05 DIAGNOSIS — R252 Cramp and spasm: Secondary | ICD-10-CM

## 2023-08-05 DIAGNOSIS — M6281 Muscle weakness (generalized): Secondary | ICD-10-CM | POA: Diagnosis not present

## 2023-08-05 DIAGNOSIS — M170 Bilateral primary osteoarthritis of knee: Secondary | ICD-10-CM

## 2023-08-05 DIAGNOSIS — R262 Difficulty in walking, not elsewhere classified: Secondary | ICD-10-CM

## 2023-08-05 NOTE — Therapy (Signed)
 OUTPATIENT PHYSICAL THERAPY VESTIBULAR REASSESSMENT NOTE   Patient Name: Kayla Rosales MRN: [id number] DOB:30-Mar-1956, 68 y.o., female Today's Date: 08/05/2023    END OF SESSION:  PT End of Session - 08/05/23 1018     Visit Number 9    Date for PT Re-Evaluation 09/26/23    Authorization Type Medicare A and B    Progress Note Due on Visit 17    PT Start Time 0932    PT Stop Time 1016    PT Time Calculation (min) 44 min    Activity Tolerance Patient tolerated treatment well    Behavior During Therapy [facility] for tasks assessed/performed                 Past Medical History:  Diagnosis Date   Allergy    Arthritis    Asthma    Back pain    Benign positional vertigo    Bilateral swelling of feet    Bronchitis    Chicken pox    Colon polyps    Constipation    Cough 10/09/2020   COVID-19 10/09/2020   Diverticulitis    Early menopause occurring in patient age younger than 45 years    Fibromyalgia    GERD (gastroesophageal reflux disease)    Gluten intolerance    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 Still unclear what is cuasing her pain. May be nerve related. May benefit from starting on gabapentin. Start gabapentin 300 mg TID. F/u 4 weeks   Night sweats    Osteopenia    Palpitations    Recurrent upper respiratory infection (URI)    Right arm weakness 09/28/2015   Last Assessment & Plan:  Formatting of this note might be different from the original. Referral to PT today. 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    Vertigo 08/11/2015   Last Assessment & Plan:  Formatting of this note might be different from the original. Concern  given gradual onset over the last week and a half in addition to neck pain. Patient needs neuroimaging to rule out dissecting vertebral aneurysm. Patient advised to go to emergency room. She declined ambulance transport   Vitamin B 12 deficiency    Vitamin D  deficiency    Past Surgical History:  Procedure Laterality Date   CARPAL TUNNEL RELEASE     COLONOSCOPY  2015   Dr. Sandy Crumb- [facility], [place]; 5 yr repeat recommended   COLONOSCOPY W/ POLYPECTOMY  05/26/2018   x5 polyps   KNEE ARTHROSCOPY Bilateral 2013   meniscus  x2   NECK SURGERY  2018   disc fusion   UPPER GASTROINTESTINAL ENDOSCOPY     Patient Active Problem List   Diagnosis Date Noted   BMI 30.0-30.9,adult 05/01/2023   Well woman exam with routine gynecological exam 04/30/2023   Vitamin D  deficiency 01/21/2022   Other tear of medial meniscus, current injury, left knee, subsequent encounter 01/14/2022   Other adverse food reactions, not elsewhere classified, subsequent encounter 03/28/2021   Diastolic dysfunction 08/02/2019   Heart murmur 02/16/2019   Hyperlipidemia LDL goal <100 02/16/2019   Class 1 obesity with  serious comorbidity and body mass index (BMI) of 30.0 to 30.9 in adult 01/04/2019   Pelvic floor dysfunction in female 12/06/2018   Constipation due to outlet dysfunction 12/06/2018   Lactose intolerance 12/06/2018   Diverticulosis 05/27/2018   Osteopenia 01/19/2018   Irritable bowel syndrome with constipation 01/17/2018   History of colonic polyp 01/17/2018   History of stroke 01/15/2018   GERD (gastroesophageal reflux disease)    Asthma    S/P cervical spinal fusion 09/29/2017   Macrocytosis without anemia 11/25/2015   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   Heart palpitations 09/08/2015   Vitamin B12 deficiency 04/06/2014   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   Vestibular neuritis 10/15/2006   Family history of malignant neoplasm of breast 03/18/2006   Diaphragmatic hernia 03/18/2006    Premature menopause 03/18/2006    PCP: Mariel Shope, DO  REFERRING PROVIDER: Mariel Shope, DO  REFERRING DIAG: R29.898 (ICD-10-CM) - Weakness of both lower extremities  THERAPY DIAG:  Cramp and spasm  Difficulty in walking, not elsewhere classified  Muscle weakness (generalized)  Bilateral primary osteoarthritis of knee  Rationale for Evaluation and Treatment: Rehabilitation  ONSET DATE: 06/12/2023  SUBJECTIVE:   SUBJECTIVE STATEMENT: I overdid it last week and over the weekend.  I have been having back pain. Knees are ok.    PERTINENT HISTORY: Bilateral knee OA PAIN: 08/05/23 Are you having pain? Yes: NPRS scale: 6/10 low back, 0/10 knees  Pain location: bilateral knees, hamstrings,  hips and low back Pain description: aching Aggravating factors: prolonged standing or walking/bending, stooping and squatting Relieving factors: rest  PRECAUTIONS: Fall  RED FLAGS: None   WEIGHT BEARING RESTRICTIONS: No  FALLS:  Has patient fallen in last 6 months? 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     NEXT MD VISIT: prn  OBJECTIVE:  Note: Objective measures were completed at Evaluation unless otherwise noted.  DIAGNOSTIC FINDINGS: na  PATIENT SURVEYS:  Eval:  LEFS 29/80=  36.3%  07/24/2023: Dizziness Handicapped Inventory: Total Score: 24 / 100 Lower Extremity Functional Score: 33 / 80 = 41.3 %  COGNITION: Overall cognitive status: Within functional limits for tasks assessed     SENSATION: WFL   MUSCLE LENGTH: Hamstrings: Right 50 deg; Left 55 deg Thomas test: Right pos; Left pos  POSTURE: No Significant postural limitations  PALPATION: Crepitus bilateral patellofemoral with seated flexion/extension  LOWER EXTREMITY ROM:  WFL  LOWER EXTREMITY MMT:  Generally 4 to 4+/5 with exception of right hip abduction 4-,  right hip ER 4-,  left hip abd and ER4-/5.    LOWER EXTREMITY SPECIAL TESTS:  Knee special tests: Patellafemoral grind test: positive   FUNCTIONAL TESTS:   07/24/2023:  3 min walk test: 668 ft with a slight pulling of right hip 5 times sit to/from stand:  13.92 sec with hands pressing up through thigh  GAIT: Distance walked: 30 feet Assistive device utilized: None Level  of assistance: Complete Independence Comments: guarded  VESTIBULAR ASSESSMENT: 07/24/2023: Smooth Pursuit is intact Gaze Stabilization is intact Saccades are intact Head Impulse Test is intact Etta Heritage is negative on the left and positive on the right                                                                                                                               TREATMENT DATE:   08/05/23  Recumbent bike x 6 min PT present to discuss progress  Seated hamstring stretch 3 x 30 sec each LE Open book stretch x10 Standing quad/hip flexor stretch 3 x 30 sec each LE Figure 4 sitting 3x20 seconds  Open book stretch with foam roll x10 each  Farmer's Carry: 5# kettle bell.  One lap around clinic in each hand PT assisted with set up of activity app on watch and phone     07/29/23  Recumbent bike x 5 min PT present to discuss progress  Standing hamstring stretch 3 x 30 sec each LE Standing quad/hip flexor stretch 3 x 30 sec each LE Figure 4  sitting 3x20 seconds  PPT x 20 PPT with alternating heel taps x 20 PPT with 90/90 heel taps x 20 PPT with dying bug x 20 Lower trunk rotation x 20 PPT with SLR to shoulder ext using 5 lb dumbbell Education on how "being active" should not replace her HEP as the exercises are specific to her needs.     07/24/2023: NuStep: Level 5 x 7 minutes- PT present to discuss progress Lower Extremity Functional Scale and Dizziness Handicapped Inventory 3 minute walk test Sit to/from stand x5 Vestibular Assessment (see above) Dix Hallpike: negative on the left, positive on the right Epley Maneuver x2 to treat right side   PATIENT EDUCATION:  Education details: Initiated HEP, dry needling info Person educated: Patient Education method: Programmer, multimedia, Demonstration, Verbal cues, and Handouts Education comprehension: verbalized understanding, returned demonstration, and verbal cues required  HOME EXERCISE PROGRAM: Access Code: [email or web address] URL: [email or web address] Date: 07/24/2023 Prepared by: Chaneta Comer Menke  Exercises - Standing Hamstring Stretch on Chair  - 1 x daily - 7 x weekly - 1 sets - 3 reps - 30 sec hold - Quadricep Stretch with Chair and Counter Support  - 1 x daily - 7 x weekly - 1 sets - 3 reps - 30 sec hold - Seated Figure 4 Piriformis Stretch  - 1 x daily - 7 x weekly - 1 sets - 3 reps - 30 sed hold - Sit to Stand Without Arm Support  - 1 x daily - 7 x weekly - 2 sets - 10 reps - Sidelying Reverse Clamshell  - 1 x daily - 7 x weekly - 2 sets - 10 reps - Clamshell  - 1 x daily - 7 x weekly - 2 sets - 10 reps - Hooklying Active Hamstring Stretch  - 1 x daily - 7 x weekly - 1-2 sets - 10  reps - Self-Epley Maneuver Right Ear  - 1 x daily - 7 x weekly - 1-2 reps  ASSESSMENT:  CLINICAL IMPRESSION: Pt arrived with increased LBP due to being very active over the past week.  She has not been consistent with exercises and is not sleeping well due to pain.  PT discussed  importance of compliance with HEP and taking pain relievers to manage her pain levels.  Patient would benefit from continued skilled PT for LE flexibility and core strengthening to progress toward final goals.    OBJECTIVE IMPAIRMENTS: decreased activity tolerance, decreased balance, decreased endurance, decreased knowledge of condition, decreased mobility, difficulty walking, decreased strength, dizziness, increased muscle spasms, impaired flexibility, and pain.   ACTIVITY LIMITATIONS: carrying, lifting, bending, standing, squatting, sleeping, stairs, transfers, dressing, and caring for others  PARTICIPATION LIMITATIONS: meal prep, cleaning, laundry, driving, shopping, community activity, occupation, yard work, and church  PERSONAL FACTORS: Fitness, Past/current experiences, and 1-2 comorbidities: asthma, hx stroke, osteopenia  are also affecting patient's functional outcome.   REHAB POTENTIAL: Fair multiple co-morbidities  CLINICAL DECISION MAKING: Evolving/moderate complexity  EVALUATION COMPLEXITY: Moderate   GOALS: Goals reviewed with patient? Yes  SHORT TERM GOALS: Target date: 07/16/2023  Patient will be independent with initial HEP  Baseline: Goal status: MET  2.  Pain report to be no greater than 4/10  Baseline: 6/10 (07/10/23) Goal status: In progress    LONG TERM GOALS: Target date: 09/26/2023   Patient to be independent with advanced HEP to allow for self progression after discharge. Baseline:  Goal status: INITIAL  2.  Patient to report ability to walk for more than 15-20 minutes without increased pain.  Baseline:  Goal status: INITIAL  3.  Patient to be able to stand or walk for at least 15 min without rest Baseline:  Goal status: Ongoing  4.  Patient will increase Lower Extremity Functional Scale to at least 50% to demonstrate improved functional mobility Baseline: 36.3% Goal status: Ongoing (see above)  5.  3 min walk test to improve by 100  feet Baseline: 668 ft on 07/24/23 Goal status: INITIAL  6.  Patient to be able to ascend and descend steps with improved confidence and without a loss of balance and no increased dizziness. Baseline:  Goal status: Ongoing  7.  Patient to have a negative Etta Heritage and report dizziness has improved at least 80%.  Baseline:  positive on the right on 07/24/23  Goal status:  New on 07/24/23   PLAN:  PT FREQUENCY: 1-2x/week  PT DURATION: 10 weeks  PLANNED INTERVENTIONS: 97164- PT Re-evaluation, 97110-Therapeutic exercises, 97530- Therapeutic activity, 97112- Neuromuscular re-education, 97535- Self Care, 16109- Manual therapy, [id number]- Gait training, [phone]- Canalith repositioning, [id number]- Aquatic Therapy, [phone]- Electrical stimulation (unattended), [phone]- Electrical stimulation (manual), [id number]- Vasopneumatic device, [id number]- Ultrasound, [id number]- Traction (mechanical), [id number]- Ionotophoresis 4mg /ml Dexamethasone, Patient/Family education, Balance training, Stair training, Taping, Dry Needling, Joint mobilization, Spinal mobilization, Scar mobilization, Vestibular training, Visual/preceptual remediation/compensation, Cryotherapy, and Moist heat  PLAN FOR NEXT SESSION:  Nustep, progress quad rehab and general LE strengthening.  Balance exercises.      Luella Sager, PT 08/05/23 10:20 AM  [facility] [address], [address] [place], [place] [address] Phone # [phone] Fax [phone]

## 2023-08-12 ENCOUNTER — Encounter

## 2023-08-14 ENCOUNTER — Ambulatory Visit: Admitting: Rehabilitative and Restorative Service Providers"

## 2023-08-19 ENCOUNTER — Encounter: Payer: Self-pay | Admitting: Rehabilitative and Restorative Service Providers"

## 2023-08-19 ENCOUNTER — Ambulatory Visit: Attending: Family Medicine | Admitting: Rehabilitative and Restorative Service Providers"

## 2023-08-19 DIAGNOSIS — M25562 Pain in left knee: Secondary | ICD-10-CM | POA: Diagnosis present

## 2023-08-19 DIAGNOSIS — G8929 Other chronic pain: Secondary | ICD-10-CM | POA: Diagnosis present

## 2023-08-19 DIAGNOSIS — H8113 Benign paroxysmal vertigo, bilateral: Secondary | ICD-10-CM | POA: Diagnosis present

## 2023-08-19 DIAGNOSIS — R252 Cramp and spasm: Secondary | ICD-10-CM | POA: Insufficient documentation

## 2023-08-19 DIAGNOSIS — M6281 Muscle weakness (generalized): Secondary | ICD-10-CM | POA: Insufficient documentation

## 2023-08-19 DIAGNOSIS — M170 Bilateral primary osteoarthritis of knee: Secondary | ICD-10-CM | POA: Diagnosis present

## 2023-08-19 DIAGNOSIS — R293 Abnormal posture: Secondary | ICD-10-CM | POA: Insufficient documentation

## 2023-08-19 DIAGNOSIS — R262 Difficulty in walking, not elsewhere classified: Secondary | ICD-10-CM | POA: Diagnosis present

## 2023-08-19 NOTE — Therapy (Signed)
 OUTPATIENT PHYSICAL THERAPY TREATMENT NOTE   Patient Name: Kayla Rosales MRN: [id number] DOB:[birth year], 68 y.o., female Today's Date: 08/19/2023    END OF SESSION:  PT End of Session - 08/19/23 0934     Visit Number 10    Date for PT Re-Evaluation 09/26/23    Authorization Type Medicare A and B    Progress Note Due on Visit 17    PT Start Time 0932    PT Stop Time 1012    PT Time Calculation (min) 40 min    Activity Tolerance Patient tolerated treatment well    Behavior During Therapy [facility] for tasks assessed/performed                 Past Medical History:  Diagnosis Date   Allergy    Arthritis    Asthma    Back pain    Benign positional vertigo    Bilateral swelling of feet    Bronchitis    Chicken pox    Colon polyps    Constipation    Cough 10/09/2020   COVID-19 10/09/2020   Diverticulitis    Early menopause occurring in patient age younger than 45 years    Fibromyalgia    GERD (gastroesophageal reflux disease)    Gluten intolerance    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 Still unclear what is cuasing her pain. May be nerve related. May benefit from starting on gabapentin. Start gabapentin 300 mg TID. F/u 4 weeks   Night sweats    Osteopenia    Palpitations    Recurrent upper respiratory infection (URI)    Right arm weakness 09/28/2015   Last Assessment & Plan:  Formatting of this note might be different from the original. Referral to PT today. 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    Vertigo 08/11/2015   Last Assessment & Plan:  Formatting of this note might be different from the original. Concern given gradual  onset over the last week and a half in addition to neck pain. Patient needs neuroimaging to rule out dissecting vertebral aneurysm. Patient advised to go to emergency room. She declined ambulance transport   Vitamin B 12 deficiency    Vitamin D  deficiency    Past Surgical History:  Procedure Laterality Date   CARPAL TUNNEL RELEASE     COLONOSCOPY  2015   Dr. Sandy Crumb- [facility] [place], [place]; 5 yr repeat recommended   COLONOSCOPY W/ POLYPECTOMY  05/26/2018   x5 polyps   KNEE ARTHROSCOPY Bilateral 2013   meniscus  x2   NECK SURGERY  2018   disc fusion   UPPER GASTROINTESTINAL ENDOSCOPY     Patient Active Problem List   Diagnosis Date Noted   BMI 30.0-30.9,adult 05/01/2023   Well woman exam with routine gynecological exam 04/30/2023   Vitamin D  deficiency 01/21/2022   Other tear of medial meniscus, current injury, left knee, subsequent encounter 01/14/2022   Other adverse food reactions, not elsewhere classified, subsequent encounter 03/28/2021   Diastolic dysfunction 08/02/2019   Heart murmur 02/16/2019   Hyperlipidemia LDL goal <100 02/16/2019   Class 1 obesity with serious  comorbidity and body mass index (BMI) of 30.0 to 30.9 in adult 01/04/2019   Pelvic floor dysfunction in female 12/06/2018   Constipation due to outlet dysfunction 12/06/2018   Lactose intolerance 12/06/2018   Diverticulosis 05/27/2018   Osteopenia 01/19/2018   Irritable bowel syndrome with constipation 01/17/2018   History of colonic polyp 01/17/2018   History of stroke 01/15/2018   GERD (gastroesophageal reflux disease)    Asthma    S/P cervical spinal fusion 09/29/2017   Macrocytosis without anemia 11/25/2015   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   Heart palpitations 09/08/2015   Vitamin B12 deficiency 04/06/2014   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   Vestibular neuritis 10/15/2006   Family history of malignant neoplasm of breast 03/18/2006   Diaphragmatic hernia 03/18/2006   Premature menopause  03/18/2006    PCP: Mariel Shope, DO  REFERRING PROVIDER: Mariel Shope, DO  REFERRING DIAG: R29.898 (ICD-10-CM) - Weakness of both lower extremities  THERAPY DIAG:  Cramp and spasm  Difficulty in walking, not elsewhere classified  Muscle weakness (generalized)  Bilateral primary osteoarthritis of knee  Chronic pain of left knee  BPPV (benign paroxysmal positional vertigo), bilateral  Rationale for Evaluation and Treatment: Rehabilitation  ONSET DATE: 06/12/2023  SUBJECTIVE:   SUBJECTIVE STATEMENT: Patient reports her biggest difficulty is negotiating the stairs.  Patient continues to deny any dizziness.  PERTINENT HISTORY: Bilateral knee OA  PAIN:  Are you having pain? Yes: NPRS scale: 6/10  Pain location: right piriformis Pain description: aching Aggravating factors: prolonged standing or walking/bending, stooping and squatting Relieving factors: rest  PRECAUTIONS: Fall  RED FLAGS: None   WEIGHT BEARING RESTRICTIONS: No  FALLS:  Has patient fallen in last 6 months? 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     NEXT MD VISIT: prn  OBJECTIVE:  Note: Objective measures were completed at Evaluation unless otherwise noted.  DIAGNOSTIC FINDINGS: na  PATIENT SURVEYS:  Eval:  LEFS 29/80=  36.3%  07/24/2023: Dizziness Handicapped Inventory: Total Score: 24 / 100 Lower Extremity Functional Score: 33 / 80 = 41.3 %  COGNITION: Overall cognitive status: Within functional limits for tasks assessed     SENSATION: WFL   MUSCLE LENGTH: Hamstrings: Right 50 deg; Left 55 deg Thomas test: Right pos; Left pos  POSTURE: No Significant postural limitations  PALPATION: Crepitus bilateral patellofemoral with seated flexion/extension  LOWER EXTREMITY ROM:  WFL  LOWER EXTREMITY MMT:  Generally 4 to 4+/5 with exception of right hip abduction 4-,  right hip ER 4-,  left hip abd and ER4-/5.    LOWER EXTREMITY SPECIAL TESTS:  Knee special tests: Patellafemoral grind test: positive   FUNCTIONAL TESTS:   07/24/2023:  3 min walk test: 668 ft with a slight pulling of right hip 5 times sit to/from stand:  13.92 sec with hands pressing up through thigh  GAIT: Distance walked: 30 feet Assistive device utilized: None Level of assistance:  Complete Independence Comments: guarded  VESTIBULAR ASSESSMENT: 07/24/2023: Smooth Pursuit is intact Gaze Stabilization is intact Saccades are intact Head Impulse Test is intact Etta Heritage is negative on the left and positive on the right                                                                                                                               TREATMENT DATE:   08/19/2023 NuStep: Level 6 x 4 minutes- PT present to discuss progress Recumbent bike level 3 x4 min with PT present to discuss status Heel tap down from 4 inch step with unilateral UE support x10 bilat Standing hip hike from 2" step 2x10 bilat Single leg stance with hand tapping to cone on PT mat 2x10 bilat Farmer's carry with 6# dumbbell and high marching x10 bilat Single leg stance with around the worlds with 6# dumbbell x10 each direction bilat 4 way resisted gait with 5# cable pulley x3 each direction Standing bird dog leaning against chair  2x10   08/05/23  Recumbent bike x 6 min PT present to discuss progress  Seated hamstring stretch 3 x 30 sec each LE Open book stretch x10 Standing quad/hip flexor stretch 3 x 30 sec each LE Figure 4 sitting 3x20 seconds  Open book stretch with foam roll x10 each  Farmer's Carry: 5# kettle bell.  One lap around clinic in each hand PT assisted with set up of activity app on watch and phone     07/29/23  Recumbent bike x 5 min PT present to discuss progress  Standing hamstring stretch 3 x 30 sec each LE Standing quad/hip flexor stretch 3 x 30 sec each LE Figure 4 sitting 3x20 seconds  PPT x 20 PPT with alternating heel taps x 20 PPT with 90/90 heel taps x 20 PPT with dying bug x 20 Lower trunk rotation x 20 PPT with SLR to shoulder ext using 5 lb dumbbell Education on how "being active" should not replace her HEP as the exercises are specific to her needs.      PATIENT EDUCATION:  Education details: Initiated HEP, dry needling info Person educated: Patient Education method: Explanation, Demonstration, Verbal cues, and Handouts Education comprehension: verbalized understanding, returned demonstration, and verbal cues required  HOME EXERCISE PROGRAM: Access Code: [email or web address] URL: [email or web address] Date: 08/19/2023 Prepared by: Chaneta Comer Filipe Greathouse  Exercises - Standing Hamstring Stretch on Chair  - 1 x daily - 7 x weekly - 1 sets - 3 reps - 30 sec hold - Quadricep Stretch with Chair and Counter Support  - 1 x daily - 7 x weekly - 1 sets - 3 reps - 30 sec hold - Seated Figure 4 Piriformis Stretch  - 1 x daily - 7 x weekly - 1 sets - 3 reps - 30 sed hold - Sit to Stand Without Arm Support  - 1 x daily - 7 x weekly - 2 sets - 10 reps - Sidelying Reverse Clamshell  -  1 x daily - 7 x weekly - 2 sets - 10 reps - Clamshell  - 1 x daily - 7 x weekly - 2 sets - 10 reps - Hooklying Active Hamstring Stretch  - 1 x daily - 7 x weekly - 1-2 sets - 10 reps - Self-Epley Maneuver  Right Ear  - 1 x daily - 7 x weekly - 1-2 reps - Bird Dog on Counter  - 1 x daily - 7 x weekly - 2 sets - 10 reps - Standing Hip Hiking  - 1 x daily - 7 x weekly - 2 sets - 10 reps - Standing March  - 1 x daily - 7 x weekly - 2 sets - 10 reps  ASSESSMENT:  CLINICAL IMPRESSION: Ms Spotts presents to skilled PT reporting that she is primarily only having pain in her right buttock region.  She states her biggest challenge at this time is stair negotiation.  Patient continues to deny any dizziness and states that she can lie down and roll over without dizziness now.  Patient able to progress with standing exercises today to focus on hip strengthening during session.  Patient with difficulty with single leg stance task secondary to hip instability.  Progressed HEP and provided patient with new handout to allow her to further progress hip strengthening during session.  Throughout session, patient reports that she can feel her hip loosening up and by end of session, patient reports pain has decreased to 3/10.  Patient states that she may need to stop PT by the end of the month secondary to the busyness of her schedule at that time.  OBJECTIVE IMPAIRMENTS: decreased activity tolerance, decreased balance, decreased endurance, decreased knowledge of condition, decreased mobility, difficulty walking, decreased strength, dizziness, increased muscle spasms, impaired flexibility, and pain.   ACTIVITY LIMITATIONS: carrying, lifting, bending, standing, squatting, sleeping, stairs, transfers, dressing, and caring for others  PARTICIPATION LIMITATIONS: meal prep, cleaning, laundry, driving, shopping, community activity, occupation, yard work, and church  PERSONAL FACTORS: Fitness, Past/current experiences, and 1-2 comorbidities: asthma, hx stroke, osteopenia  are also affecting patient's functional outcome.   REHAB POTENTIAL: Fair multiple co-morbidities  CLINICAL DECISION MAKING: Evolving/moderate  complexity  EVALUATION COMPLEXITY: Moderate   GOALS: Goals reviewed with patient? Yes  SHORT TERM GOALS: Target date: 07/16/2023  Patient will be independent with initial HEP  Baseline: Goal status: MET  2.  Pain report to be no greater than 4/10  Baseline: 6/10 (07/10/23) Goal status: In progress    LONG TERM GOALS: Target date: 09/26/2023   Patient to be independent with advanced HEP to allow for self progression after discharge. Baseline:  Goal status: Ongoing  2.  Patient to report ability to walk for more than 15-20 minutes without increased pain.  Baseline:  Goal status: Ongoing (reports 10 min on 08/19/23)  3.  Patient to be able to stand or walk for at least 15 min without rest Baseline:  Goal status: Ongoing  4.  Patient will increase Lower Extremity Functional Scale to at least 50% to demonstrate improved functional mobility Baseline: 36.3% Goal status: Ongoing (see above)  5.  3 min walk test to improve by 100 feet Baseline: 668 ft on 07/24/23 Goal status: INITIAL  6.  Patient to be able to ascend and descend steps with improved confidence and without a loss of balance and no increased dizziness. Baseline:  Goal status: Ongoing  7.  Patient to have a negative Etta Heritage and report dizziness has improved at least  80%.  Baseline:  positive on the right on 07/24/23  Goal status:  Met on 08/19/23   PLAN:  PT FREQUENCY: 1-2x/week  PT DURATION: 10 weeks  PLANNED INTERVENTIONS: [id number]- PT Re-evaluation, 97110-Therapeutic exercises, 97530- Therapeutic activity, 97112- Neuromuscular re-education, 97535- Self Care, 28413- Manual therapy, [id number]- Gait training, [phone]- Canalith repositioning, [id number]- Aquatic Therapy, [phone]- Electrical stimulation (unattended), [phone]- Electrical stimulation (manual), [id number]- Vasopneumatic device, [id number]- Ultrasound, [id number]- Traction (mechanical), [id number]- Ionotophoresis 4mg /ml Dexamethasone, Patient/Family education, Balance training, Stair  training, Taping, Dry Needling, Joint mobilization, Spinal mobilization, Scar mobilization, Vestibular training, Visual/preceptual remediation/compensation, Cryotherapy, and Moist heat  PLAN FOR NEXT SESSION:  3 minute walk test, Nustep, progress quad rehab and general LE strengthening.  Balance exercises.      Robyne Christen, PT, DPT 08/19/23, 10:22 AM  [facility] [address], [address] [place], [place] [address] Phone # [phone] Fax [phone]

## 2023-08-21 ENCOUNTER — Ambulatory Visit

## 2023-08-21 DIAGNOSIS — R252 Cramp and spasm: Secondary | ICD-10-CM | POA: Diagnosis not present

## 2023-08-21 DIAGNOSIS — R262 Difficulty in walking, not elsewhere classified: Secondary | ICD-10-CM

## 2023-08-21 DIAGNOSIS — M6281 Muscle weakness (generalized): Secondary | ICD-10-CM

## 2023-08-21 DIAGNOSIS — M170 Bilateral primary osteoarthritis of knee: Secondary | ICD-10-CM

## 2023-08-21 DIAGNOSIS — G8929 Other chronic pain: Secondary | ICD-10-CM

## 2023-08-21 DIAGNOSIS — R293 Abnormal posture: Secondary | ICD-10-CM

## 2023-08-21 NOTE — Therapy (Signed)
 OUTPATIENT PHYSICAL THERAPY TREATMENT NOTE   Patient Name: Kayla Rosales MRN: [id number] DOB:07-Dec-1955, 68 y.o., female Today's Date: 08/21/2023    END OF SESSION:  PT End of Session - 08/21/23 1025     Visit Number 11    Date for PT Re-Evaluation 09/26/23    Authorization Type Medicare A and B    Progress Note Due on Visit 17    PT Start Time 1018    PT Stop Time 1100    PT Time Calculation (min) 42 min    Activity Tolerance Patient tolerated treatment well    Behavior During Therapy WFL for tasks assessed/performed                 Past Medical History:  Diagnosis Date   Allergy    Arthritis    Asthma    Back pain    Benign positional vertigo    Bilateral swelling of feet    Bronchitis    Chicken pox    Colon polyps    Constipation    Cough 10/09/2020   COVID-19 10/09/2020   Diverticulitis    Early menopause occurring in patient age younger than 45 years    Fibromyalgia    GERD (gastroesophageal reflux disease)    Gluten intolerance    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 Still unclear what is cuasing her pain. May be nerve related. May benefit from starting on gabapentin. Start gabapentin 300 mg TID. F/u 4 weeks   Night sweats    Osteopenia    Palpitations    Recurrent upper respiratory infection (URI)    Right arm weakness 09/28/2015   Last Assessment & Plan:  Formatting of this note might be different from the original. Referral to PT today. 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    Vertigo 08/11/2015   Last Assessment & Plan:  Formatting of this note might be different from the original. Concern given gradual  onset over the last week and a half in addition to neck pain. Patient needs neuroimaging to rule out dissecting vertebral aneurysm. Patient advised to go to emergency room. She declined ambulance transport   Vitamin B 12 deficiency    Vitamin D  deficiency    Past Surgical History:  Procedure Laterality Date   CARPAL TUNNEL RELEASE     COLONOSCOPY  2015   Dr. Sandy Crumb- [facility], [place]; 5 yr repeat recommended   COLONOSCOPY W/ POLYPECTOMY  05/26/2018   x5 polyps   KNEE ARTHROSCOPY Bilateral 2013   meniscus  x2   NECK SURGERY  2018   disc fusion   UPPER GASTROINTESTINAL ENDOSCOPY     Patient Active Problem List   Diagnosis Date Noted   BMI 30.0-30.9,adult 05/01/2023   Well woman exam with routine gynecological exam 04/30/2023   Vitamin D  deficiency 01/21/2022   Other tear of medial meniscus, current injury, left knee, subsequent encounter 01/14/2022   Other adverse food reactions, not elsewhere classified, subsequent encounter 03/28/2021   Diastolic dysfunction 08/02/2019   Heart murmur 02/16/2019   Hyperlipidemia LDL goal <100 02/16/2019   Class 1 obesity with serious  comorbidity and body mass index (BMI) of 30.0 to 30.9 in adult 01/04/2019   Pelvic floor dysfunction in female 12/06/2018   Constipation due to outlet dysfunction 12/06/2018   Lactose intolerance 12/06/2018   Diverticulosis 05/27/2018   Osteopenia 01/19/2018   Irritable bowel syndrome with constipation 01/17/2018   History of colonic polyp 01/17/2018   History of stroke 01/15/2018   GERD (gastroesophageal reflux disease)    Asthma    S/P cervical spinal fusion 09/29/2017   Macrocytosis without anemia 11/25/2015   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   Heart palpitations 09/08/2015   Vitamin B12 deficiency 04/06/2014   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   Vestibular neuritis 10/15/2006   Family history of malignant neoplasm of breast 03/18/2006   Diaphragmatic hernia 03/18/2006   Premature menopause  03/18/2006    PCP: Mariel Shope, DO  REFERRING PROVIDER: Mariel Shope, DO  REFERRING DIAG: R29.898 (ICD-10-CM) - Weakness of both lower extremities  THERAPY DIAG:  Muscle weakness (generalized)  Chronic pain of left knee  Difficulty in walking, not elsewhere classified  Cramp and spasm  Bilateral primary osteoarthritis of knee  Abnormal posture  Rationale for Evaluation and Treatment: Rehabilitation  ONSET DATE: 06/12/2023  SUBJECTIVE:   SUBJECTIVE STATEMENT: Patient reports no longer having dizziness.  She continues to have some gluteal pain when she drives long distances but overall, she is doing fairly well.    PERTINENT HISTORY: Bilateral knee OA  PAIN:  Are you having pain? Yes: NPRS scale: 6/10  Pain location: right piriformis Pain description: aching Aggravating factors: prolonged standing or walking/bending, stooping and squatting Relieving factors: rest  PRECAUTIONS: Fall  RED FLAGS: None   WEIGHT BEARING RESTRICTIONS: No  FALLS:  Has patient fallen in last 6 months? 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     NEXT MD VISIT: prn  OBJECTIVE:  Note: Objective measures were completed at Evaluation unless otherwise noted.  DIAGNOSTIC FINDINGS: na  PATIENT SURVEYS:  Eval:  LEFS  29/80= 36.3%  07/24/2023: Dizziness Handicapped Inventory: Total Score: 24 / 100 Lower Extremity Functional Score: 33 / 80 = 41.3 %  COGNITION: Overall cognitive status: Within functional limits for tasks assessed     SENSATION: WFL   MUSCLE LENGTH: Hamstrings: Right 50 deg; Left 55 deg Thomas test: Right pos; Left pos  POSTURE: No Significant postural limitations  PALPATION: Crepitus bilateral patellofemoral with seated flexion/extension  LOWER EXTREMITY ROM:  WFL  LOWER EXTREMITY MMT:  Generally 4 to 4+/5 with exception of right hip abduction 4-,  right hip ER 4-,  left hip abd and ER4-/5.    LOWER EXTREMITY SPECIAL TESTS:  Knee special tests: Patellafemoral grind test: positive   FUNCTIONAL TESTS:   07/24/2023:  3 min walk test: 668 ft with a slight pulling of right hip 5 times sit to/from stand:  13.92 sec with hands pressing up through thigh  GAIT: Distance walked: 30  feet Assistive device utilized: None Level of assistance: Complete Independence Comments: guarded  VESTIBULAR ASSESSMENT: 07/24/2023: Smooth Pursuit is intact Gaze Stabilization is intact Saccades are intact Head Impulse Test is intact Etta Heritage is negative on the left and positive on the right                                                                                                                               TREATMENT DATE:  08/21/2023 Recumbent bike level 3 x 5 min with PT present to discuss status Sit to stand 2 x 10 Seated LAQ x 20 each with 5 lbs Seated march x 20 with 5 lbs Seated hip ER x 20 Hook lying clamshell x 20 with red loop Side lying clam with red loop x 20 both SAQ with 5 lbs x 20  TKE with 5 lbs x 20  08/19/2023 NuStep: Level 6 x 4 minutes- PT present to discuss progress Recumbent bike level 3 x4 min with PT present to discuss status Heel tap down from 4 inch step with unilateral UE support x10 bilat Standing hip hike from 2" step 2x10 bilat Single leg  stance with hand tapping to cone on PT mat 2x10 bilat Farmer's carry with 6# dumbbell and high marching x10 bilat Single leg stance with around the worlds with 6# dumbbell x10 each direction bilat 4 way resisted gait with 5# cable pulley x3 each direction Standing bird dog leaning against chair 2x10   08/05/23  Recumbent bike x 6 min PT present to discuss progress  Seated hamstring stretch 3 x 30 sec each LE Open book stretch x10 Standing quad/hip flexor stretch 3 x 30 sec each LE Figure 4 sitting 3x20 seconds  Open book stretch with foam roll x10 each  Farmer's Carry: 5# kettle bell.  One lap around clinic in each hand PT assisted with set up of activity app on watch and phone     07/29/23  Recumbent bike x 5 min PT present to discuss progress  Standing hamstring stretch 3 x 30 sec each LE Standing quad/hip flexor stretch 3 x 30 sec each LE Figure 4 sitting 3x20 seconds  PPT x 20 PPT with alternating heel taps x 20 PPT with 90/90 heel taps x 20 PPT with dying bug x 20 Lower trunk rotation x 20 PPT with SLR to shoulder ext using 5 lb dumbbell Education on how "being active" should not replace her HEP as the exercises are specific to her needs.      PATIENT EDUCATION:  Education details: Initiated HEP, dry needling info Person educated: Patient Education method: Explanation, Demonstration, Verbal cues, and Handouts Education comprehension: verbalized understanding, returned demonstration, and verbal cues required  HOME EXERCISE PROGRAM: Access Code: [email or web address] URL: [email or web address] Date: 08/19/2023 Prepared by: Chaneta Comer Menke  Exercises - Standing Hamstring Stretch on Chair  - 1 x daily - 7 x weekly - 1 sets - 3 reps - 30 sec hold -  Quadricep Stretch with Chair and Counter Support  - 1 x daily - 7 x weekly - 1 sets - 3 reps - 30 sec hold - Seated Figure 4 Piriformis Stretch  - 1 x daily - 7 x weekly - 1 sets - 3 reps - 30 sed hold - Sit to Stand Without  Arm Support  - 1 x daily - 7 x weekly - 2 sets - 10 reps - Sidelying Reverse Clamshell  - 1 x daily - 7 x weekly - 2 sets - 10 reps - Clamshell  - 1 x daily - 7 x weekly - 2 sets - 10 reps - Hooklying Active Hamstring Stretch  - 1 x daily - 7 x weekly - 1-2 sets - 10 reps - Self-Epley Maneuver Right Ear  - 1 x daily - 7 x weekly - 1-2 reps - Bird Dog on Counter  - 1 x daily - 7 x weekly - 2 sets - 10 reps - Standing Hip Hiking  - 1 x daily - 7 x weekly - 2 sets - 10 reps - Standing March  - 1 x daily - 7 x weekly - 2 sets - 10 reps  ASSESSMENT:  CLINICAL IMPRESSION: Kayla Rosales is still having some mild pain in her right buttock region.  She continues to have difficulty with stair negotiation.  Patient continues to deny any dizziness and states that she can lie down and roll over without dizziness now.  We focused on LE strength today including hip stability.  She reported no pain and was able to do these exercises with ease.  She should continue to do well.  She would benefit from continuing skilled PT for hip and LE strengthening, core strengthening and LE flexibility.    OBJECTIVE IMPAIRMENTS: decreased activity tolerance, decreased balance, decreased endurance, decreased knowledge of condition, decreased mobility, difficulty walking, decreased strength, dizziness, increased muscle spasms, impaired flexibility, and pain.   ACTIVITY LIMITATIONS: carrying, lifting, bending, standing, squatting, sleeping, stairs, transfers, dressing, and caring for others  PARTICIPATION LIMITATIONS: meal prep, cleaning, laundry, driving, shopping, community activity, occupation, yard work, and church  PERSONAL FACTORS: Fitness, Past/current experiences, and 1-2 comorbidities: asthma, hx stroke, osteopenia are also affecting patient's functional outcome.   REHAB POTENTIAL: Fair multiple co-morbidities  CLINICAL DECISION MAKING: Evolving/moderate complexity  EVALUATION COMPLEXITY: Moderate   GOALS: Goals  reviewed with patient? Yes  SHORT TERM GOALS: Target date: 07/16/2023  Patient will be independent with initial HEP  Baseline: Goal status: MET  2.  Pain report to be no greater than 4/10  Baseline: 6/10 (07/10/23) Goal status: In progress    LONG TERM GOALS: Target date: 09/26/2023   Patient to be independent with advanced HEP to allow for self progression after discharge. Baseline:  Goal status: Ongoing  2.  Patient to report ability to walk for more than 15-20 minutes without increased pain.  Baseline:  Goal status: Ongoing (reports 10 min on 08/19/23)  3.  Patient to be able to stand or walk for at least 15 min without rest Baseline:  Goal status: Ongoing  4.  Patient will increase Lower Extremity Functional Scale to at least 50% to demonstrate improved functional mobility Baseline: 36.3% Goal status: Ongoing (see above)  5.  3 min walk test to improve by 100 feet Baseline: 668 ft on 07/24/23 Goal status: INITIAL  6.  Patient to be able to ascend and descend steps with improved confidence and without a loss of balance and no increased dizziness. Baseline:  Goal status: Ongoing  7.  Patient to have a negative Etta Heritage and report dizziness has improved at least 80%.  Baseline:  positive on the right on 07/24/23  Goal status:  Met on 08/19/23   PLAN:  PT FREQUENCY: 1-2x/week  PT DURATION: 10 weeks  PLANNED INTERVENTIONS: 97164- PT Re-evaluation, 97110-Therapeutic exercises, 97530- Therapeutic activity, 97112- Neuromuscular re-education, 97535- Self Care, 21308- Manual therapy, [id number]- Gait training, [phone]- Canalith repositioning, [id number]- Aquatic Therapy, [phone]- Electrical stimulation (unattended), [phone]- Electrical stimulation (manual), [id number]- Vasopneumatic device, [id number]- Ultrasound, [id number]- Traction (mechanical), [id number]- Ionotophoresis 4mg /ml Dexamethasone, Patient/Family education, Balance training, Stair training, Taping, Dry Needling, Joint mobilization, Spinal  mobilization, Scar mobilization, Vestibular training, Visual/preceptual remediation/compensation, Cryotherapy, and Moist heat  PLAN FOR NEXT SESSION:  3 minute walk test, Nustep, progress quad rehab and general LE strengthening.  Balance exercises.      [facility] B. Haleemah Buckalew, PT 08/21/23 9:03 PM [facility] [address], [address] [place], [place] [address] Phone # [phone] Fax [phone]

## 2023-08-26 ENCOUNTER — Ambulatory Visit

## 2023-08-26 DIAGNOSIS — R252 Cramp and spasm: Secondary | ICD-10-CM

## 2023-08-26 DIAGNOSIS — G8929 Other chronic pain: Secondary | ICD-10-CM

## 2023-08-26 DIAGNOSIS — M170 Bilateral primary osteoarthritis of knee: Secondary | ICD-10-CM

## 2023-08-26 DIAGNOSIS — R293 Abnormal posture: Secondary | ICD-10-CM

## 2023-08-26 DIAGNOSIS — M6281 Muscle weakness (generalized): Secondary | ICD-10-CM

## 2023-08-26 DIAGNOSIS — R262 Difficulty in walking, not elsewhere classified: Secondary | ICD-10-CM

## 2023-08-26 NOTE — Therapy (Signed)
 OUTPATIENT PHYSICAL THERAPY TREATMENT NOTE   Patient Name: Kayla Rosales MRN: [id number] DOB:1955/05/16, 68 y.o., female Today's Date: 08/26/2023    END OF SESSION:  PT End of Session - 08/26/23 1019     Visit Number 12    Date for PT Re-Evaluation 09/26/23    Authorization Type Medicare A and B    Progress Note Due on Visit 17    PT Start Time 1016    PT Stop Time 1059    PT Time Calculation (min) 43 min    Activity Tolerance Patient tolerated treatment well    Behavior During Therapy WFL for tasks assessed/performed                 Past Medical History:  Diagnosis Date   Allergy    Arthritis    Asthma    Back pain    Benign positional vertigo    Bilateral swelling of feet    Bronchitis    Chicken pox    Colon polyps    Constipation    Cough 10/09/2020   COVID-19 10/09/2020   Diverticulitis    Early menopause occurring in patient age younger than 45 years    Fibromyalgia    GERD (gastroesophageal reflux disease)    Gluten intolerance    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 Still unclear what is cuasing her pain. May be nerve related. May benefit from starting on gabapentin. Start gabapentin 300 mg TID. F/u 4 weeks   Night sweats    Osteopenia    Palpitations    Recurrent upper respiratory infection (URI)    Right arm weakness 09/28/2015   Last Assessment & Plan:  Formatting of this note might be different from the original. Referral to PT today. 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    Vertigo 08/11/2015   Last Assessment & Plan:  Formatting of this note might be different from the original. Concern given gradual  onset over the last week and a half in addition to neck pain. Patient needs neuroimaging to rule out dissecting vertebral aneurysm. Patient advised to go to emergency room. She declined ambulance transport   Vitamin B 12 deficiency    Vitamin D  deficiency    Past Surgical History:  Procedure Laterality Date   CARPAL TUNNEL RELEASE     COLONOSCOPY  2015   Dr. Sandy Crumb- [facility], [place]; 5 yr repeat recommended   COLONOSCOPY W/ POLYPECTOMY  05/26/2018   x5 polyps   KNEE ARTHROSCOPY Bilateral 2013   meniscus  x2   NECK SURGERY  2018   disc fusion   UPPER GASTROINTESTINAL ENDOSCOPY     Patient Active Problem List   Diagnosis Date Noted   BMI 30.0-30.9,adult 05/01/2023   Well woman exam with routine gynecological exam 04/30/2023   Vitamin D  deficiency 01/21/2022   Other tear of medial meniscus, current injury, left knee, subsequent encounter 01/14/2022   Other adverse food reactions, not elsewhere classified, subsequent encounter 03/28/2021   Diastolic dysfunction 08/02/2019   Heart murmur 02/16/2019   Hyperlipidemia LDL goal <100 02/16/2019   Class 1 obesity with serious  comorbidity and body mass index (BMI) of 30.0 to 30.9 in adult 01/04/2019   Pelvic floor dysfunction in female 12/06/2018   Constipation due to outlet dysfunction 12/06/2018   Lactose intolerance 12/06/2018   Diverticulosis 05/27/2018   Osteopenia 01/19/2018   Irritable bowel syndrome with constipation 01/17/2018   History of colonic polyp 01/17/2018   History of stroke 01/15/2018   GERD (gastroesophageal reflux disease)    Asthma    S/P cervical spinal fusion 09/29/2017   Macrocytosis without anemia 11/25/2015   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   Heart palpitations 09/08/2015   Vitamin B12 deficiency 04/06/2014   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   Vestibular neuritis 10/15/2006   Family history of malignant neoplasm of breast 03/18/2006   Diaphragmatic hernia 03/18/2006   Premature menopause  03/18/2006    PCP: Mariel Shope, DO  REFERRING PROVIDER: Mariel Shope, DO  REFERRING DIAG: R29.898 (ICD-10-CM) - Weakness of both lower extremities  THERAPY DIAG:  Muscle weakness (generalized)  Chronic pain of left knee  Difficulty in walking, not elsewhere classified  Cramp and spasm  Abnormal posture  Bilateral primary osteoarthritis of knee  Rationale for Evaluation and Treatment: Rehabilitation  ONSET DATE: 06/12/2023  SUBJECTIVE:   SUBJECTIVE STATEMENT: Patient reports her pain level is very minimal.  She went out of town over the weekend and had some jitteryness but this has resolved.  She states she will likely end PT soon as long as her pain stays well controlled.    PERTINENT HISTORY: Bilateral knee OA  PAIN:  08/26/23 Are you having pain? Yes: NPRS scale: 0/10  Pain location: right piriformis Pain description: aching Aggravating factors: prolonged standing or walking/bending, stooping and squatting Relieving factors: rest  PRECAUTIONS: Fall  RED FLAGS: None   WEIGHT BEARING RESTRICTIONS: No  FALLS:  Has patient fallen in last 6 months? 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     NEXT MD VISIT: prn  OBJECTIVE:  Note: Objective measures were completed at Evaluation unless  otherwise noted.  DIAGNOSTIC FINDINGS: na  PATIENT SURVEYS:  Eval:  LEFS 29/80= 36.3%  07/24/2023: Dizziness Handicapped Inventory: Total Score: 24 / 100 Lower Extremity Functional Score: 33 / 80 = 41.3 %  COGNITION: Overall cognitive status: Within functional limits for tasks assessed     SENSATION: WFL   MUSCLE LENGTH: Hamstrings: Right 50 deg; Left 55 deg Thomas test: Right pos; Left pos  POSTURE: No Significant postural limitations  PALPATION: Crepitus bilateral patellofemoral with seated flexion/extension  LOWER EXTREMITY ROM:  WFL  LOWER EXTREMITY MMT:  Generally 4 to 4+/5 with exception of right hip abduction 4-,  right hip ER 4-,  left hip abd and ER4-/5.    LOWER EXTREMITY SPECIAL TESTS:  Knee special tests: Patellafemoral grind test: positive   FUNCTIONAL TESTS:   07/24/2023:  3 min walk test: 668 ft with a slight pulling of right hip 5 times  sit to/from stand:  13.92 sec with hands pressing up through thigh  GAIT: Distance walked: 30 feet Assistive device utilized: None Level of assistance: Complete Independence Comments: guarded  VESTIBULAR ASSESSMENT: 07/24/2023: Smooth Pursuit is intact Gaze Stabilization is intact Saccades are intact Head Impulse Test is intact Etta Heritage is negative on the left and positive on the right                                                                                                                               TREATMENT DATE:  08/26/2023 Recumbent bike level 3 x 5 min with PT present to discuss status Sit to stand 2 x 10 with 5 lb kb Seated LAQ x 20 each with 5 lbs Seated march x 20 with 5 lbs Seated hip ER x 20 Seated clamshell with red loop x 20 Lateral band walks with blue loop x 3 laps of 15 feet Seated hamstring curl 2 x 10 with green band   08/21/2023 Recumbent bike level 3 x 5 min with PT present to discuss status Sit to stand 2 x 10 Seated LAQ x 20 each with 5 lbs Seated march x 20 with 5  lbs Seated hip ER x 20 Hook lying clamshell x 20 with red loop Side lying clam with red loop x 20 both SAQ with 5 lbs x 20  TKE with 5 lbs x 20  08/19/2023 NuStep: Level 6 x 4 minutes- PT present to discuss progress Recumbent bike level 3 x4 min with PT present to discuss status Heel tap down from 4 inch step with unilateral UE support x10 bilat Standing hip hike from 2" step 2x10 bilat Single leg stance with hand tapping to cone on PT mat 2x10 bilat Farmer's carry with 6# dumbbell and high marching x10 bilat Single leg stance with around the worlds with 6# dumbbell x10 each direction bilat 4 way resisted gait with 5# cable pulley x3 each direction Standing bird dog leaning against chair 2x10  PATIENT EDUCATION:  Education details: Initiated HEP, dry needling info Person educated: Patient Education method: Programmer, multimedia, Demonstration, Verbal cues, and Handouts Education comprehension: verbalized understanding, returned demonstration, and verbal cues required  HOME EXERCISE PROGRAM: Access Code: [email or web address] URL: [email or web address] Date: 08/19/2023 Prepared by: Chaneta Comer Menke  Exercises - Standing Hamstring Stretch on Chair  - 1 x daily - 7 x weekly - 1 sets - 3 reps - 30 sec hold - Quadricep Stretch with Chair and Counter Support  - 1 x daily - 7 x weekly - 1 sets - 3 reps - 30 sec hold - Seated Figure 4 Piriformis Stretch  - 1 x daily - 7 x weekly - 1 sets - 3 reps - 30 sed hold - Sit to Stand Without Arm Support  - 1 x daily - 7 x weekly - 2 sets - 10 reps - Sidelying Reverse Clamshell  - 1 x daily - 7 x weekly - 2 sets -  10 reps - Clamshell  - 1 x daily - 7 x weekly - 2 sets - 10 reps - Hooklying Active Hamstring Stretch  - 1 x daily - 7 x weekly - 1-2 sets - 10 reps - Self-Epley Maneuver Right Ear  - 1 x daily - 7 x weekly - 1-2 reps - Bird Dog on Counter  - 1 x daily - 7 x weekly - 2 sets - 10 reps - Standing Hip Hiking  - 1 x daily - 7 x weekly - 2 sets - 10  reps - Standing March  - 1 x daily - 7 x weekly - 2 sets - 10 reps  ASSESSMENT:  CLINICAL IMPRESSION: Thersia Flax reported minimal pain for the last week and has been able to do most of her usual activity without any increase in pain.  She did some traveling last week and had no pain with being in the car for long period of time.  She is compliant and well motivated.   She should continue to do well.  She would benefit from continuing skilled PT for hip and LE strengthening, core strengthening and LE flexibility.  She mentioned possibly DC at end of May due to her grand kids will be out of school.     OBJECTIVE IMPAIRMENTS: decreased activity tolerance, decreased balance, decreased endurance, decreased knowledge of condition, decreased mobility, difficulty walking, decreased strength, dizziness, increased muscle spasms, impaired flexibility, and pain.   ACTIVITY LIMITATIONS: carrying, lifting, bending, standing, squatting, sleeping, stairs, transfers, dressing, and caring for others  PARTICIPATION LIMITATIONS: meal prep, cleaning, laundry, driving, shopping, community activity, occupation, yard work, and church  PERSONAL FACTORS: Fitness, Past/current experiences, and 1-2 comorbidities: asthma, hx stroke, osteopenia are also affecting patient's functional outcome.   REHAB POTENTIAL: Fair multiple co-morbidities  CLINICAL DECISION MAKING: Evolving/moderate complexity  EVALUATION COMPLEXITY: Moderate   GOALS: Goals reviewed with patient? Yes  SHORT TERM GOALS: Target date: 07/16/2023  Patient will be independent with initial HEP  Baseline: Goal status: MET  2.  Pain report to be no greater than 4/10  Baseline: 6/10 (07/10/23) Goal status: MET 08/26/23   LONG TERM GOALS: Target date: 09/26/2023   Patient to be independent with advanced HEP to allow for self progression after discharge. Baseline:  Goal status: MET 08/26/23  2.  Patient to report ability to walk for more than 15-20 minutes  without increased pain.  Baseline:  Goal status: Ongoing (reports 10 min on 08/19/23)  3.  Patient to be able to stand or walk for at least 15 min without rest Baseline:  Goal status: MET 08/26/23  4.  Patient will increase Lower Extremity Functional Scale to at least 50% to demonstrate improved functional mobility Baseline: 36.3% Goal status: Ongoing (see above)  5.  3 min walk test to improve by 100 feet Baseline: 668 ft on 07/24/23 Goal status: In Progress  6.  Patient to be able to ascend and descend steps with improved confidence and without a loss of balance and no increased dizziness. Baseline:  Goal status: MET 08/26/23  7.  Patient to have a negative Etta Heritage and report dizziness has improved at least 80%.  Baseline:  positive on the right on 07/24/23  Goal status:  Met on 08/19/23   PLAN:  PT FREQUENCY: 1-2x/week  PT DURATION: 10 weeks  PLANNED INTERVENTIONS: 97164- PT Re-evaluation, 97110-Therapeutic exercises, 97530- Therapeutic activity, 97112- Neuromuscular re-education, 97535- Self Care, 16109- Manual therapy, [id number]- Gait training, [phone]- Canalith repositioning, [id number]- Aquatic Therapy, U9811-  Electrical stimulation (unattended), [id number]- Electrical stimulation (manual), 14782- Vasopneumatic device, [id number]- Ultrasound, [id number]- Traction (mechanical), 95621- Ionotophoresis 4mg /ml Dexamethasone, Patient/Family education, Balance training, Stair training, Taping, Dry Needling, Joint mobilization, Spinal mobilization, Scar mobilization, Vestibular training, Visual/preceptual remediation/compensation, Cryotherapy, and Moist heat  PLAN FOR NEXT SESSION:  3 minute walk test, Nustep, progress quad rehab and general LE strengthening.  Balance exercises.      [facility] B. Mitcheal Sweetin, PT 08/26/23 11:15 AM [facility] [address] [place], [place] [address] Phone # [phone] Fax [phone]

## 2023-08-28 ENCOUNTER — Ambulatory Visit

## 2023-08-28 DIAGNOSIS — R252 Cramp and spasm: Secondary | ICD-10-CM

## 2023-08-28 DIAGNOSIS — G8929 Other chronic pain: Secondary | ICD-10-CM

## 2023-08-28 DIAGNOSIS — R262 Difficulty in walking, not elsewhere classified: Secondary | ICD-10-CM

## 2023-08-28 DIAGNOSIS — M6281 Muscle weakness (generalized): Secondary | ICD-10-CM

## 2023-08-28 NOTE — Therapy (Signed)
 OUTPATIENT PHYSICAL THERAPY TREATMENT NOTE   Patient Name: Kayla Rosales MRN: [id number] DOB:08-07-1955, 68 y.o., female Today's Date: 08/28/2023    END OF SESSION:  PT End of Session - 08/28/23 1016     Visit Number 13    Date for PT Re-Evaluation 09/26/23    Authorization Type Medicare A and B    Progress Note Due on Visit 17    PT Start Time 0935    PT Stop Time 1016    PT Time Calculation (min) 41 min    Activity Tolerance Patient tolerated treatment well    Behavior During Therapy [facility] for tasks assessed/performed                  Past Medical History:  Diagnosis Date   Allergy    Arthritis    Asthma    Back pain    Benign positional vertigo    Bilateral swelling of feet    Bronchitis    Chicken pox    Colon polyps    Constipation    Cough 10/09/2020   COVID-19 10/09/2020   Diverticulitis    Early menopause occurring in patient age younger than 45 years    Fibromyalgia    GERD (gastroesophageal reflux disease)    Gluten intolerance    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 Still unclear what is cuasing her pain. May be nerve related. May benefit from starting on gabapentin. Start gabapentin 300 mg TID. F/u 4 weeks   Night sweats    Osteopenia    Palpitations    Recurrent upper respiratory infection (URI)    Right arm weakness 09/28/2015   Last Assessment & Plan:  Formatting of this note might be different from the original. Referral to PT today. 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    Vertigo 08/11/2015   Last Assessment & Plan:  Formatting of this note might be different from the original. Concern given  gradual onset over the last week and a half in addition to neck pain. Patient needs neuroimaging to rule out dissecting vertebral aneurysm. Patient advised to go to emergency room. She declined ambulance transport   Vitamin B 12 deficiency    Vitamin D  deficiency    Past Surgical History:  Procedure Laterality Date   CARPAL TUNNEL RELEASE     COLONOSCOPY  2015   Dr. Sandy Crumb- [facility], [place]; 5 yr repeat recommended   COLONOSCOPY W/ POLYPECTOMY  05/26/2018   x5 polyps   KNEE ARTHROSCOPY Bilateral 2013   meniscus  x2   NECK SURGERY  2018   disc fusion   UPPER GASTROINTESTINAL ENDOSCOPY     Patient Active Problem List   Diagnosis Date Noted   BMI 30.0-30.9,adult 05/01/2023   Well woman exam with routine gynecological exam 04/30/2023   Vitamin D  deficiency 01/21/2022   Other tear of medial meniscus, current injury, left knee, subsequent encounter 01/14/2022   Other adverse food reactions, not elsewhere classified, subsequent encounter 03/28/2021   Diastolic dysfunction 08/02/2019   Heart murmur 02/16/2019   Hyperlipidemia LDL goal <100 02/16/2019   Class 1 obesity with  serious comorbidity and body mass index (BMI) of 30.0 to 30.9 in adult 01/04/2019   Pelvic floor dysfunction in female 12/06/2018   Constipation due to outlet dysfunction 12/06/2018   Lactose intolerance 12/06/2018   Diverticulosis 05/27/2018   Osteopenia 01/19/2018   Irritable bowel syndrome with constipation 01/17/2018   History of colonic polyp 01/17/2018   History of stroke 01/15/2018   GERD (gastroesophageal reflux disease)    Asthma    S/P cervical spinal fusion 09/29/2017   Macrocytosis without anemia 11/25/2015   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   Heart palpitations 09/08/2015   Vitamin B12 deficiency 04/06/2014   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   Vestibular neuritis 10/15/2006   Family history of malignant neoplasm of breast 03/18/2006   Diaphragmatic hernia 03/18/2006   Premature  menopause 03/18/2006    PCP: Mariel Shope, DO  REFERRING PROVIDER: Mariel Shope, DO  REFERRING DIAG: R29.898 (ICD-10-CM) - Weakness of both lower extremities  THERAPY DIAG:  Muscle weakness (generalized)  Chronic pain of left knee  Difficulty in walking, not elsewhere classified  Cramp and spasm  Rationale for Evaluation and Treatment: Rehabilitation  ONSET DATE: 06/12/2023  SUBJECTIVE:   SUBJECTIVE STATEMENT: Still doing well.  I lifted some soil yesterday and I thought my back would be sore but it hasn't been. I feel 65% better overall.    PERTINENT HISTORY: Bilateral knee OA  PAIN:  08/28/23 Are you having pain? Yes: NPRS scale: 1-2/10  Pain location: bil knees  Pain description: aching Aggravating factors: prolonged standing or walking/bending, stooping and squatting Relieving factors: rest  PRECAUTIONS: Fall  RED FLAGS: None   WEIGHT BEARING RESTRICTIONS: No  FALLS:  Has patient fallen in last 6 months? 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     NEXT MD VISIT: prn  OBJECTIVE:  Note: Objective measures were completed at Evaluation unless otherwise noted.  DIAGNOSTIC FINDINGS: na  PATIENT SURVEYS:  Eval:  LEFS 29/80= 36.3%  07/24/2023: Dizziness Handicapped Inventory: Total  Score: 24 / 100 Lower Extremity Functional Score: 33 / 80 = 41.3 %  COGNITION: Overall cognitive status: Within functional limits for tasks assessed     SENSATION: WFL   MUSCLE LENGTH: Hamstrings: Right 50 deg; Left 55 deg Thomas test: Right pos; Left pos  POSTURE: No Significant postural limitations  PALPATION: Crepitus bilateral patellofemoral with seated flexion/extension  LOWER EXTREMITY ROM:  WFL  LOWER EXTREMITY MMT:  Generally 4 to 4+/5 with exception of right hip abduction 4-,  right hip ER 4-,  left hip abd and ER4-/5.    LOWER EXTREMITY SPECIAL TESTS:  Knee special tests: Patellafemoral grind test: positive   FUNCTIONAL TESTS:   07/24/2023:  3 min walk test: 668 ft with a slight pulling of right hip 5 times sit to/from stand:  13.92 sec with hands pressing up through thigh  GAIT: Distance walked: 30 feet Assistive device utilized: None Level  of assistance: Complete Independence Comments: guarded  VESTIBULAR ASSESSMENT: 07/24/2023: Smooth Pursuit is intact Gaze Stabilization is intact Saccades are intact Head Impulse Test is intact Etta Heritage is negative on the left and positive on the right                                                                                                                               TREATMENT DATE:   08/28/2023 Recumbent bike level 3 x 7 min with PT present to discuss status Sit to stand 2 x 10 with 5 lb kb Seated LAQ x 20 each with 5 lbs Seated march x 20 with 5 lbs Seated hip ER x 20 with 5 lbs Standing on balance pad: around the world with 5# kettlebell x20 reps each Lateral band walks with blue loop x 3 laps of 15 feet Seated hamstring curl 2 x 10 with green band  Seated hamstring stretch 2x20 seconds   08/26/2023 Recumbent bike level 3 x 5 min with PT present to discuss status Sit to stand 2 x 10 with 5 lb kb Seated LAQ x 20 each with 5 lbs Seated march x 20 with 5 lbs Seated hip ER x 20 Seated  clamshell with red loop x 20 Lateral band walks with blue loop x 3 laps of 15 feet Seated hamstring curl 2 x 10 with green band   08/21/2023 Recumbent bike level 3 x 5 min with PT present to discuss status Sit to stand 2 x 10 Seated LAQ x 20 each with 5 lbs Seated march x 20 with 5 lbs Seated hip ER x 20 Hook lying clamshell x 20 with red loop Side lying clam with red loop x 20 both SAQ with 5 lbs x 20  TKE with 5 lbs x 20   PATIENT EDUCATION:  Education details: Initiated HEP, dry needling info Person educated: Patient Education method: Programmer, multimedia, Demonstration, Verbal cues, and Handouts Education comprehension: verbalized understanding, returned demonstration, and verbal cues required  HOME EXERCISE PROGRAM: Access Code: [email or web address] URL: [email or web address] Date: 08/19/2023 Prepared by: Chaneta Comer Menke  Exercises - Standing Hamstring Stretch on Chair  - 1 x daily - 7 x weekly - 1 sets - 3 reps - 30 sec hold - Quadricep Stretch with Chair and Counter Support  - 1 x daily - 7 x weekly - 1 sets - 3 reps - 30 sec hold - Seated Figure 4 Piriformis Stretch  - 1 x daily - 7 x weekly - 1 sets - 3 reps - 30 sed hold - Sit to Stand Without Arm Support  - 1 x daily - 7 x weekly - 2 sets - 10 reps - Sidelying Reverse Clamshell  - 1 x daily - 7 x weekly - 2 sets - 10 reps - Clamshell  - 1 x daily - 7 x weekly - 2 sets - 10 reps - Hooklying Active Hamstring Stretch  - 1 x daily - 7 x  weekly - 1-2 sets - 10 reps - Self-Epley Maneuver Right Ear  - 1 x daily - 7 x weekly - 1-2 reps - Bird Dog on Counter  - 1 x daily - 7 x weekly - 2 sets - 10 reps - Standing Hip Hiking  - 1 x daily - 7 x weekly - 2 sets - 10 reps - Standing March  - 1 x daily - 7 x weekly - 2 sets - 10 reps  ASSESSMENT:  CLINICAL IMPRESSION: Pt reports 65% overall reduction in pain since the start of care. She has been very active at home with yard work.  She feels like PT is helping each session and she has  reduced pain after. She is compliant and well motivated.   PT monitored throughout session for technique and pain. She would benefit from continuing skilled PT for hip and LE strengthening, core strengthening and LE flexibility.  She mentioned possibly DC at end of May due to her grand kids will be out of school.     OBJECTIVE IMPAIRMENTS: decreased activity tolerance, decreased balance, decreased endurance, decreased knowledge of condition, decreased mobility, difficulty walking, decreased strength, dizziness, increased muscle spasms, impaired flexibility, and pain.   ACTIVITY LIMITATIONS: carrying, lifting, bending, standing, squatting, sleeping, stairs, transfers, dressing, and caring for others  PARTICIPATION LIMITATIONS: meal prep, cleaning, laundry, driving, shopping, community activity, occupation, yard work, and church  PERSONAL FACTORS: Fitness, Past/current experiences, and 1-2 comorbidities: asthma, hx stroke, osteopenia are also affecting patient's functional outcome.   REHAB POTENTIAL: Fair multiple co-morbidities  CLINICAL DECISION MAKING: Evolving/moderate complexity  EVALUATION COMPLEXITY: Moderate   GOALS: Goals reviewed with patient? Yes  SHORT TERM GOALS: Target date: 07/16/2023  Patient will be independent with initial HEP  Baseline: Goal status: MET  2.  Pain report to be no greater than 4/10  Baseline: 6/10 (07/10/23) Goal status: MET 08/26/23   LONG TERM GOALS: Target date: 09/26/2023   Patient to be independent with advanced HEP to allow for self progression after discharge. Baseline:  Goal status: MET 08/26/23  2.  Patient to report ability to walk for more than 15-20 minutes without increased pain.  Baseline:  Goal status: Ongoing (reports 10 min on 08/19/23)  3.  Patient to be able to stand or walk for at least 15 min without rest Baseline:  Goal status: MET 08/26/23  4.  Patient will increase Lower Extremity Functional Scale to at least 50% to  demonstrate improved functional mobility Baseline: 36.3% Goal status: Ongoing (see above)  5.  3 min walk test to improve by 100 feet Baseline: 668 ft on 07/24/23 Goal status: In Progress  6.  Patient to be able to ascend and descend steps with improved confidence and without a loss of balance and no increased dizziness. Baseline:  Goal status: MET 08/26/23  7.  Patient to have a negative Etta Heritage and report dizziness has improved at least 80%.  Baseline:  positive on the right on 07/24/23  Goal status:  Met on 08/19/23   PLAN:  PT FREQUENCY: 1-2x/week  PT DURATION: 10 weeks  PLANNED INTERVENTIONS: [id number]- PT Re-evaluation, 97110-Therapeutic exercises, 97530- Therapeutic activity, 97112- Neuromuscular re-education, 97535- Self Care, 62130- Manual therapy, [phone]- Gait training, [phone]- Canalith repositioning, [id number]- Aquatic Therapy, [phone]- Electrical stimulation (unattended), [phone]- Electrical stimulation (manual), [id number]- Vasopneumatic device, [id number]- Ultrasound, [id number]- Traction (mechanical), [id number]- Ionotophoresis 4mg /ml Dexamethasone, Patient/Family education, Balance training, Stair training, Taping, Dry Needling, Joint mobilization, Spinal mobilization, Scar mobilization, Vestibular training, Visual/preceptual remediation/compensation, Cryotherapy, and  Moist heat  PLAN FOR NEXT SESSION:  strength, balance and endurance. LEFS    Luella Sager, PT 08/28/23 10:16 AM  [facility] [address] [place], [place] [address] Phone # [phone] Fax [phone]

## 2023-09-02 ENCOUNTER — Ambulatory Visit

## 2023-09-02 DIAGNOSIS — R262 Difficulty in walking, not elsewhere classified: Secondary | ICD-10-CM

## 2023-09-02 DIAGNOSIS — M6281 Muscle weakness (generalized): Secondary | ICD-10-CM

## 2023-09-02 DIAGNOSIS — M170 Bilateral primary osteoarthritis of knee: Secondary | ICD-10-CM

## 2023-09-02 DIAGNOSIS — R252 Cramp and spasm: Secondary | ICD-10-CM

## 2023-09-02 DIAGNOSIS — G8929 Other chronic pain: Secondary | ICD-10-CM

## 2023-09-02 DIAGNOSIS — R293 Abnormal posture: Secondary | ICD-10-CM

## 2023-09-02 NOTE — Therapy (Signed)
 OUTPATIENT PHYSICAL THERAPY TREATMENT NOTE PHYSICAL THERAPY DISCHARGE SUMMARY  Visits from Start of Care: 14  Current functional level related to goals / functional outcomes: See below   Remaining deficits: See below   Education / Equipment: See below   Patient agrees to discharge. Patient goals were met. Patient is being discharged due to meeting the stated rehab goals.    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 Still unclear what is cuasing her pain. May be nerve related. May benefit from starting on gabapentin. Start gabapentin 300 mg TID. F/u 4 weeks   Night sweats    Osteopenia    Palpitations    Recurrent upper respiratory infection (URI)    Right arm weakness 09/28/2015   Last Assessment & Plan:  Formatting of this note might be different from the original. Referral to PT today. 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    Vertigo 08/11/2015   Last Assessment & Plan:  Formatting of this note might be different from the original. Concern given gradual onset over the last week and a  half in addition to neck pain. Patient needs neuroimaging to rule out dissecting vertebral aneurysm. Patient advised to go to emergency room. She declined ambulance transport   Vitamin B 12 deficiency    Vitamin D  deficiency    Past Surgical History:  Procedure Laterality Date   CARPAL TUNNEL RELEASE     COLONOSCOPY  2015   Dr. Sandy Crumb- [facility], [place]; 5 yr repeat recommended   COLONOSCOPY W/ POLYPECTOMY  05/26/2018   x5 polyps   KNEE ARTHROSCOPY Bilateral 2013   meniscus  x2   NECK SURGERY  2018   disc fusion   UPPER GASTROINTESTINAL ENDOSCOPY     Patient Active Problem List   Diagnosis Date Noted   BMI 30.0-30.9,adult 05/01/2023   Well woman exam with routine gynecological exam 04/30/2023   Vitamin D  deficiency 01/21/2022   Other tear of medial meniscus, current injury, left knee, subsequent encounter 01/14/2022   Other adverse food reactions, not elsewhere classified, subsequent encounter 03/28/2021   Diastolic dysfunction 08/02/2019   Heart murmur 02/16/2019   Hyperlipidemia LDL goal <100 02/16/2019   Class 1 obesity with serious comorbidity and body mass index (BMI) of 30.0 to 30.9 in adult 01/04/2019   Pelvic floor dysfunction in female 12/06/2018   Constipation due to outlet dysfunction 12/06/2018   Lactose intolerance  12/06/2018   Diverticulosis 05/27/2018   Osteopenia 01/19/2018   Irritable bowel syndrome with constipation 01/17/2018   History of colonic polyp 01/17/2018   History of stroke 01/15/2018   GERD (gastroesophageal reflux disease)    Asthma    S/P cervical spinal fusion 09/29/2017   Macrocytosis without anemia 11/25/2015   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   Heart palpitations 09/08/2015   Vitamin B12 deficiency 04/06/2014   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   Vestibular neuritis 10/15/2006   Family history of malignant neoplasm of breast 03/18/2006   Diaphragmatic hernia 03/18/2006   Premature menopause 03/18/2006    PCP: Mariel Shope, DO  REFERRING PROVIDER: Mariel Shope, DO  REFERRING DIAG: R29.898 (ICD-10-CM) - Weakness of both lower extremities  THERAPY DIAG:  Muscle weakness (generalized)  Chronic pain of left knee  Difficulty in walking, not elsewhere classified  Cramp and spasm  Abnormal posture  Bilateral primary osteoarthritis of knee  Rationale for Evaluation and Treatment: Rehabilitation  ONSET DATE: 06/12/2023  SUBJECTIVE:   SUBJECTIVE STATEMENT: Patient denies any new issues.  She confirms she is ready for DC.  A little back pain and very mild knee pain but knows that she will need to continue her HEP.      PERTINENT HISTORY: Bilateral knee OA  PAIN:  09/02/23 Are you having pain? Yes: NPRS scale: 1-2/10  Pain location: in my back which is about normal for me, knees around 1/10 Pain description: aching Aggravating factors: prolonged standing or walking/bending, stooping and squatting Relieving factors: rest  PRECAUTIONS: Fall  RED FLAGS: None   WEIGHT BEARING RESTRICTIONS: No  FALLS:  Has patient fallen in last 6 months? 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     NEXT MD VISIT: prn  OBJECTIVE:  Note: Objective measures were completed at Evaluation unless otherwise noted.  DIAGNOSTIC FINDINGS:  na  PATIENT SURVEYS:  Eval:  LEFS 29/80= 36.3%  09/02/23: LEFS 62/80= 77.5%  07/24/2023: Dizziness Handicapped Inventory: Total Score: 24 / 100 Lower Extremity Functional Score: 33 / 80 = 41.3 %  COGNITION: Overall cognitive status: Within functional limits for tasks assessed     SENSATION: WFL   MUSCLE LENGTH: Hamstrings: Right 50 deg; Left 55 deg Thomas test: Right pos; Left pos  POSTURE: No Significant postural limitations  PALPATION: Crepitus bilateral patellofemoral with seated flexion/extension  LOWER EXTREMITY ROM:  WFL  LOWER EXTREMITY MMT:  Generally 4 to 4+/5 with exception of right hip abduction 4-,  right hip ER 4-,  left hip abd and ER4-/5.    09/02/23: Improved to 4+/5 including hip ER and abduction  LOWER EXTREMITY SPECIAL TESTS:  Knee special tests: Patellafemoral grind test: positive   09/02/23: Still having some crepitus but not as painful  FUNCTIONAL TESTS:   07/24/2023:  3 min walk test: 668 ft with a slight pulling of right hip 5 times sit to/from stand:  13.92  sec with hands pressing up through thigh  09/02/2023:  3 min walk test: 767 ft with a slight pulling of right hip 5 times sit to/from stand:  10.29 sec with hands pressing up through thigh  GAIT: Distance walked: 30 feet Assistive device utilized: None Level of assistance: Complete Independence Comments: guarded  VESTIBULAR ASSESSMENT: 07/24/2023: Smooth Pursuit is intact Gaze Stabilization is intact Saccades are intact Head Impulse Test is intact Etta Heritage is negative on the left and positive on the right                                                                                                                               TREATMENT DATE:  09/02/2023 Nustep x 10 min level 4 DC assessment completed Reviewed and updated HEP  08/28/2023 Recumbent bike level 3 x 7 min with PT present to discuss status Sit to stand 2 x 10 with 5 lb kb Seated LAQ x 20 each with 5  lbs Seated march x 20 with 5 lbs Seated hip ER x 20 with 5 lbs Standing on balance pad: around the world with 5# kettlebell x20 reps each Lateral band walks with blue loop x 3 laps of 15 feet Seated hamstring curl 2 x 10 with green band  Seated hamstring stretch 2x20 seconds   08/26/2023 Recumbent bike level 3 x 5 min with PT present to discuss status Sit to stand 2 x 10 with 5 lb kb Seated LAQ x 20 each with 5 lbs Seated march x 20 with 5 lbs Seated hip ER x 20 Seated clamshell with red loop x 20 Lateral band walks with blue loop x 3 laps of 15 feet Seated hamstring curl 2 x 10 with green band    PATIENT EDUCATION:  Education details: Initiated HEP, dry needling info Person educated: Patient Education method: Programmer, multimedia, Demonstration, Verbal cues, and Handouts Education comprehension: verbalized understanding, returned demonstration, and verbal cues required  HOME EXERCISE PROGRAM: Access Code: [email or web address] URL: [email or web address] Date: 09/02/2023 Prepared by: Aletha Anderson  Exercises - Standing Hamstring Stretch on Chair  - 1 x daily - 7 x weekly - 1 sets - 3 reps - 30 sec hold - Quadricep Stretch with Chair and Counter Support  - 1 x daily - 7 x weekly - 1 sets - 3 reps - 30 sec hold - Seated Figure 4 Piriformis Stretch  - 1 x daily - 7 x weekly - 1 sets - 3 reps - 30 sed hold - Seated Long Arc Quad  - 1 x daily - 7 x weekly - 3 sets - 10 reps - Sit to Stand Without Arm Support  - 1 x daily - 7 x weekly - 2 sets - 10 reps - Hooklying Clamshell with Resistance  - 1 x daily - 7 x weekly - 3 sets - 10 reps - Sidelying Reverse Clamshell  - 1 x daily - 7 x weekly - 2 sets - 10  reps - Clamshell  - 1 x daily - 7 x weekly - 2 sets - 10 reps - Small Range Straight Leg Raise  - 1 x daily - 7 x weekly - 2 sets - 10 reps - Side Stepping with Resistance at Ankles  - 1 x daily - 7 x weekly - 3 sets - 10 reps - Bird Dog on Counter  - 1 x daily - 7 x weekly - 2 sets - 10  reps - Standing Hip Hiking  - 1 x daily - 7 x weekly - 2 sets - 10 reps - Standing March  - 1 x daily - 7 x weekly - 2 sets - 10 reps - Self-Epley Maneuver Right Ear  - 1 x daily - 7 x weekly - 1-2 reps ASSESSMENT:  CLINICAL IMPRESSION: Andy has met all goals and objective findings are much improved.  She is well motivated and compliant.  She should continue to do well.  We will DC at this time.      OBJECTIVE IMPAIRMENTS: decreased activity tolerance, decreased balance, decreased endurance, decreased knowledge of condition, decreased mobility, difficulty walking, decreased strength, dizziness, increased muscle spasms, impaired flexibility, and pain.   ACTIVITY LIMITATIONS: carrying, lifting, bending, standing, squatting, sleeping, stairs, transfers, dressing, and caring for others  PARTICIPATION LIMITATIONS: meal prep, cleaning, laundry, driving, shopping, community activity, occupation, yard work, and church  PERSONAL FACTORS: Fitness, Past/current experiences, and 1-2 comorbidities: asthma, hx stroke, osteopenia are also affecting patient's functional outcome.   REHAB POTENTIAL: Fair multiple co-morbidities  CLINICAL DECISION MAKING: Evolving/moderate complexity  EVALUATION COMPLEXITY: Moderate   GOALS: Goals reviewed with patient? Yes  SHORT TERM GOALS: Target date: 07/16/2023  Patient will be independent with initial HEP  Baseline: Goal status: MET  2.  Pain report to be no greater than 4/10  Baseline: 6/10 (07/10/23) Goal status: MET 08/26/23   LONG TERM GOALS: Target date: 09/26/2023   Patient to be independent with advanced HEP to allow for self progression after discharge. Baseline:  Goal status: MET 08/26/23  2.  Patient to report ability to walk for more than 15-20 minutes without increased pain.  Baseline:  Goal status: MET 09/02/23  3.  Patient to be able to stand or walk for at least 15 min without rest Baseline:  Goal status: MET 08/26/23  4.  Patient  will increase Lower Extremity Functional Scale to at least 50% to demonstrate improved functional mobility Baseline: 36.3% Goal status: MET 09/02/23 (77.5%)  5.  3 min walk test to improve by 100 feet Baseline: 668 ft on 07/24/23 Goal status: MET 09/02/23 768 ft  6.  Patient to be able to ascend and descend steps with improved confidence and without a loss of balance and no increased dizziness. Baseline:  Goal status: MET 08/26/23  7.  Patient to have a negative Etta Heritage and report dizziness has improved at least 80%.  Baseline:  positive on the right on 07/24/23  Goal status:  Met on 08/19/23   PLAN:  PT FREQUENCY: 1-2x/week  PT DURATION: 10 weeks  PLANNED INTERVENTIONS: [id number]- PT Re-evaluation, 97110-Therapeutic exercises, 97530- Therapeutic activity, 97112- Neuromuscular re-education, 97535- Self Care, 04540- Manual therapy, [phone]- Gait training, [phone]- Canalith repositioning, [id number]- Aquatic Therapy, [phone]- Electrical stimulation (unattended), [phone]- Electrical stimulation (manual), [id number]- Vasopneumatic device, [id number]- Ultrasound, [id number]- Traction (mechanical), [id number]- Ionotophoresis 4mg /ml Dexamethasone, Patient/Family education, Balance training, Stair training, Taping, Dry Needling, Joint mobilization, Spinal mobilization, Scar mobilization, Vestibular training, Visual/preceptual remediation/compensation, Cryotherapy, and Moist heat  PLAN FOR  NEXT SESSION: DC   Leighann Amadon B. Braylea Brancato, PT 09/09/23 9:13 AM [facility] [address] [place], [place] [address] Phone # [phone] Fax [phone]

## 2023-09-04 ENCOUNTER — Ambulatory Visit

## 2023-09-09 ENCOUNTER — Ambulatory Visit

## 2023-09-11 ENCOUNTER — Ambulatory Visit: Admitting: Rehabilitative and Restorative Service Providers"

## 2023-09-16 ENCOUNTER — Ambulatory Visit: Admitting: Rehabilitative and Restorative Service Providers"

## 2023-09-18 ENCOUNTER — Encounter: Admitting: Rehabilitative and Restorative Service Providers"

## 2023-09-23 ENCOUNTER — Encounter: Admitting: Rehabilitative and Restorative Service Providers"

## 2023-09-25 ENCOUNTER — Encounter: Admitting: Rehabilitative and Restorative Service Providers"

## 2023-12-04 ENCOUNTER — Encounter: Payer: Self-pay | Admitting: Internal Medicine

## 2023-12-11 ENCOUNTER — Other Ambulatory Visit (HOSPITAL_BASED_OUTPATIENT_CLINIC_OR_DEPARTMENT_OTHER): Payer: Self-pay | Admitting: Nurse Practitioner

## 2023-12-11 DIAGNOSIS — M542 Cervicalgia: Secondary | ICD-10-CM

## 2023-12-18 ENCOUNTER — Encounter: Payer: Self-pay | Admitting: Internal Medicine

## 2023-12-21 ENCOUNTER — Ambulatory Visit (HOSPITAL_BASED_OUTPATIENT_CLINIC_OR_DEPARTMENT_OTHER)
Admission: RE | Admit: 2023-12-21 | Discharge: 2023-12-21 | Disposition: A | Discharge status: Home / Self Care | Source: Ambulatory Visit | Attending: Nurse Practitioner | Admitting: Nurse Practitioner

## 2023-12-21 DIAGNOSIS — M542 Cervicalgia: Secondary | ICD-10-CM | POA: Diagnosis present

## 2023-12-29 ENCOUNTER — Ambulatory Visit: Admitting: Physical Therapy

## 2023-12-30 ENCOUNTER — Other Ambulatory Visit: Payer: Self-pay

## 2023-12-30 ENCOUNTER — Ambulatory Visit: Attending: Nurse Practitioner

## 2023-12-30 DIAGNOSIS — M542 Cervicalgia: Secondary | ICD-10-CM | POA: Diagnosis present

## 2023-12-30 DIAGNOSIS — M6281 Muscle weakness (generalized): Secondary | ICD-10-CM | POA: Insufficient documentation

## 2023-12-30 DIAGNOSIS — R252 Cramp and spasm: Secondary | ICD-10-CM | POA: Insufficient documentation

## 2023-12-30 NOTE — Therapy (Signed)
 OUTPATIENT PHYSICAL THERAPY CERVICAL EVALUATION   Patient Name: Kayla Rosales MRN: [id number] DOB:November 30, 1955, 68 y.o., female Today's Date: 12/30/2023  END OF SESSION:  PT End of Session - 12/30/23 1207     Visit Number 1    Date for PT Re-Evaluation 02/24/24    Authorization Type Medicare    Progress Note Due on Visit 10    PT Start Time 1148    PT Stop Time 1235    PT Time Calculation (min) 47 min    Activity Tolerance Patient tolerated treatment well    Behavior During Therapy [facility] for tasks assessed/performed          Past Medical History:  Diagnosis Date   Allergy    Arthritis    Asthma    Back pain    Benign positional vertigo    Bilateral swelling of feet    Bronchitis    Chicken pox    Colon polyps    Constipation    Cough 10/09/2020   COVID-19 10/09/2020   Diverticulitis    Early menopause occurring in patient age younger than 45 years    Fibromyalgia    GERD (gastroesophageal reflux disease)    Gluten intolerance    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 Still unclear what is cuasing her pain. May be nerve related. May benefit from starting on gabapentin. Start gabapentin 300 mg TID. F/u 4 weeks   Night sweats    Osteopenia    Palpitations    Recurrent upper respiratory infection (URI)    Right arm weakness 09/28/2015   Last Assessment & Plan:  Formatting of this note might be different from the original. Referral to PT today. 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    Vertigo 08/11/2015   Last Assessment & Plan:  Formatting of this note might be different from the original. Concern given gradual onset over the last  week and a half in addition to neck pain. Patient needs neuroimaging to rule out dissecting vertebral aneurysm. Patient advised to go to emergency room. She declined ambulance transport   Vitamin B 12 deficiency    Vitamin D  deficiency    Past Surgical History:  Procedure Laterality Date   CARPAL TUNNEL RELEASE     COLONOSCOPY  2015   Dr. Louellen- [facility], [place]; 5 yr repeat recommended   COLONOSCOPY W/ POLYPECTOMY  05/26/2018   x5 polyps   KNEE ARTHROSCOPY Bilateral 2013   meniscus  x2   NECK SURGERY  2018   disc fusion   UPPER GASTROINTESTINAL ENDOSCOPY     Patient Active Problem List   Diagnosis Date Noted   BMI 30.0-30.9,adult 05/01/2023   Well woman exam with routine gynecological exam 04/30/2023   Vitamin D  deficiency 01/21/2022   Other tear of medial meniscus, current injury, left knee, subsequent encounter 01/14/2022   Other adverse food reactions, not elsewhere classified, subsequent encounter 03/28/2021   Diastolic dysfunction 08/02/2019   Heart murmur 02/16/2019   Hyperlipidemia LDL goal <100 02/16/2019   Class 1 obesity with serious comorbidity and body mass index (BMI) of 30.0 to 30.9 in adult  01/04/2019   Pelvic floor dysfunction in female 12/06/2018   Constipation due to outlet dysfunction 12/06/2018   Lactose intolerance 12/06/2018   Diverticulosis 05/27/2018   Osteopenia 01/19/2018   Irritable bowel syndrome with constipation 01/17/2018   History of colonic polyp 01/17/2018   History of stroke 01/15/2018   GERD (gastroesophageal reflux disease)    Asthma    S/P cervical spinal fusion 09/29/2017   Macrocytosis without anemia 11/25/2015   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   Heart palpitations 09/08/2015   Vitamin B12 deficiency 04/06/2014   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   Vestibular neuritis 10/15/2006   Family history of malignant neoplasm of breast 03/18/2006   Diaphragmatic hernia 03/18/2006   Premature menopause 03/18/2006    PCP:  Catherine Charlies LABOR, DO  REFERRING PROVIDER: Pegge Hadassah CROME, NP  REFERRING DIAG: M54.2 (ICD-10-CM) - Cervicalgia  THERAPY DIAG:  Cervicalgia - Plan: PT plan of care cert/re-cert  Cramp and spasm - Plan: PT plan of care cert/re-cert  Muscle weakness (generalized) - Plan: PT plan of care cert/re-cert  Rationale for Evaluation and Treatment: Rehabilitation  ONSET DATE: 12/17/2023  SUBJECTIVE:                                                                                                                                                                                                         SUBJECTIVE STATEMENT: Cervical fusion 8 years ago.   Hand dominance: Right  PERTINENT HISTORY:  ACDF 8 years agod  PAIN:  Are you having pain? Yes: NPRS scale: 0/10 at the moment, 4/10 with driving Pain location: neck Pain description: aching Aggravating factors: sleeping Relieving factors: moving around  PRECAUTIONS: None  RED FLAGS: None     WEIGHT BEARING RESTRICTIONS: No  FALLS:  Has patient fallen in last 6 months? No  LIVING ENVIRONMENT: Lives with: lives with their spouse Lives in: House/apartment   OCCUPATION: Retired  PLOF: Independent, Independent with basic ADLs, Independent with household mobility without device, Independent with community mobility without device, Independent with homemaking with ambulation, Independent with gait, and Independent with transfers  PATIENT GOALS: to relieve neck pain and avoid hand cramps  NEXT MD VISIT: prn  OBJECTIVE:  Note: Objective measures were completed at Evaluation unless otherwise noted.  DIAGNOSTIC FINDINGS:  12/20/23 MRI: IMPRESSION: Prior ACDF at C5-C7   Mild degenerative disc disease and moderate bilateral facet arthropathy at C3-4 with moderate right neural foraminal stenosis.   Moderate left facet arthropathy at C4-5 with mild left neural foraminal stenosis  PATIENT SURVEYS:  NDI:  NECK DISABILITY INDEX  Date: 12/30/23 Score  Total 12/50   Minimum Detectable Change (90% confidence): 5 points or 10% points  COGNITION: Overall cognitive status: Within functional limits for tasks assessed  SENSATION: WFL Occasional hands going numb  POSTURE: rounded shoulders and forward head  PALPATION: Tight bilateral upper traps, mild tenderness at sub occipital area   CERVICAL ROM:   Active ROM A/PROM (deg) eval  Flexion WNL  Extension 75%  Right lateral flexion 35  Left lateral flexion 25  Right rotation 50  Left rotation 38   (Blank rows = not tested)  UPPER EXTREMITY ROM:  WFL (tight at end range)  UPPER EXTREMITY MMT:  WFL, limited at terminal range flexion and abduction bilaterally,  left shoulder ER : hand to back of head  CERVICAL SPECIAL TESTS:  Spurling's test: Negative   TREATMENT DATE:  12/30/23  Initial eval completed and initiated HEP Educated on possible diagnoses for her hands drawing and being numb when waking during the night Discussed the possibility of left shoulder issues as she is tight and has strength deficits on this shoulder                                                                                                                                PATIENT EDUCATION:  Education details: Educated on possible diagnoses for her hands drawing and being numb when waking during the night, Discussed the possibility of left shoulder issues as she is tight and has strength deficits on this shoulder  Person educated: Patient Education method: Explanation, Demonstration, Verbal cues, and Handouts Education comprehension: verbalized understanding, returned demonstration, and verbal cues required  HOME EXERCISE PROGRAM: Access Code: [facility] URL: [email or web address] Date: 12/30/2023 Prepared by: Delon Haddock  Exercises - Seated Scapular Retraction  - 1 x daily - 7 x weekly - 2 sets - 10 reps - Shoulder Rolls in Sitting  - 1 x daily - 7 x  weekly - 2 sets - 10 reps - Seated Cervical Retraction  - 1 x daily - 7 x weekly - 1 sets - 10 reps - Seated Cervical Rotation AROM  - 1 x daily - 7 x weekly - 1 sets - 10 reps - Seated Cervical Sidebending AROM  - 1 x daily - 7 x weekly - 1 sets - 10 reps - Shoulder extension with resistance - Neutral  - 1 x daily - 7 x weekly - 2 sets - 10 reps - Standing Shoulder Row with Anchored Resistance  - 1 x daily - 7 x weekly - 2 sets - 10 reps - Seated Shoulder External Rotation AAROM with Pulley  - 1 x daily - 7 x weekly - 2 sets - 10 reps - Seated Shoulder Horizontal Abduction with Resistance  - 1 x daily - 7 x weekly - 2 sets - 10 reps  ASSESSMENT:  CLINICAL IMPRESSION: Patient is a 68 y.o. female who was seen today for physical therapy evaluation and treatment  for neck pain.  She presents with decreased C Spine ROM, restriction in left shoulder ROM, weakness left shoulder ER,  and elevated pain.  She is also having some neurological symptoms that we will monitor closely.  She has experience waking with both hands clinched in a fist and difficulty extending her fingers back out.  She will keep us  informed of the frequency of this.  She would benefit from skilled PT for C spine ROM, postural strengthening, Left shoulder strengthening and pain control.     OBJECTIVE IMPAIRMENTS: decreased ROM, decreased strength, hypomobility, increased fascial restrictions, increased muscle spasms, impaired sensation, impaired UE functional use, postural dysfunction, and pain.   ACTIVITY LIMITATIONS: carrying, sleeping, bathing, dressing, reach over head, and hygiene/grooming  PARTICIPATION LIMITATIONS: meal prep, cleaning, laundry, shopping, community activity, and yard work  PERSONAL FACTORS: Fitness are also affecting patient's functional outcome.   REHAB POTENTIAL: Good  CLINICAL DECISION MAKING: Stable/uncomplicated  EVALUATION COMPLEXITY: Low   GOALS: Goals reviewed with patient? Yes  SHORT TERM  GOALS: Target date: 01/27/2024  Pain report to be no greater than 4/10  Baseline:  Goal status: INITIAL  2.  Patient will be independent with initial HEP  Baseline:  Goal status: INITIAL   LONG TERM GOALS: Target date: 02/24/2024  Patient to report pain no greater than 2/10  Baseline:  Goal status: INITIAL  2.  Patient to be independent with advanced HEP  Baseline:  Goal status: INITIAL  3.  Increase C spine ROM in rotation left by 2-3 degrees and in side bending left by 2-3 degrees Baseline:  Goal status: INITIAL  4.  Eliminate hand fisting Baseline:  Goal status: INITIAL  5.  NDI to improve to 10/50 Baseline:  Goal status: INITIAL  6.  Left shoulder ROM and strength to be equal to right shoulder Baseline:  Goal status: INITIAL   PLAN:  PT FREQUENCY: 1-2x/week  PT DURATION: 8 weeks  PLANNED INTERVENTIONS: 97110-Therapeutic exercises, 97530- Therapeutic activity, 97112- Neuromuscular re-education, 97535- Self Care, [id number]- Manual therapy, [phone]- Canalith repositioning, [id number]- Aquatic Therapy, [id number]- Electrical stimulation (unattended), [phone]- Electrical stimulation (manual), [id number]- Ultrasound, [id number]- Ionotophoresis 4mg /ml Dexamethasone, 79439 (1-2 muscles), [id number] (3+ muscles)- Dry Needling, Patient/Family education, Taping, Joint mobilization, Spinal mobilization, Vestibular training, Cryotherapy, and Moist heat  PLAN FOR NEXT SESSION: UBE, review HEP, add in left shoulder strengthening, PROM C spine or MELT  Tabius Rood B. Kaydence Menard, PT 12/30/23 2:58 PM [facility] [address], [address] [place], [place] [address] Phone # [phone] Fax [phone]

## 2024-01-02 NOTE — Progress Notes (Signed)
 Kayla Rosales 1956/03/22 female [id number]  Chief Complaint  Patient presents with  . Follow-up    After MRI Cervical @  Imaging in [facility] (12/21/23 Report in Media) & EOS Xrays (already done 12/09/23), Pt instructed to bring CD    HPI:  This is a 68 yr old pleasant female SP C5C7 ACDF done in 2018. Limited ROM on the L > R. She  reports crepitus, pain on her neck at night. She feels a little twinge on the L side of her neck.  Over time she feels  her R hand has become weaker. Hx of stroke  and she believes the weakness on her hand comes from that. No pain or numbness throughout the arms. No dropping objects. Balance has been off for awhile. She has started Pt. No injections. Rates her pain 2/10.   Current Medications[1]  Past Medical History:  Diagnosis Date  . Allergic   . Arthritis   . Asthma (*) 11/19/2016   INHALER PRN  . Environmental and seasonal allergies   . GERD (gastroesophageal reflux disease)   . 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 Lumbar herniated disc    L4 & L5  . Stroke (*) 07/2015   NO RESIDUAL   Past Surgical History:  Procedure Laterality Date  . Colonoscopy    . Knee surgery Bilateral    Meniscus repairs & clean up  . Neck surgery     disk fusion  . Upper gastrointestinal endoscopy     Family History  Problem Relation Age of Onset  . Depression Mother   . Early death Mother   . Heart disease Mother   . Hypertension Mother   . Mental illness Mother   . Arthritis Father   . Heart disease Father   . Macular degeneration Father   . Breast cancer Cousin 58.00  . Alcohol abuse Sister   . Breast cancer Sister 52.00  . COPD Sister   . Depression Sister   . Drug abuse Sister   . Early death Sister   . Mental illness Sister   . Asthma Son   . OCD Son   . Breast cancer Paternal Aunt 82.00  . Colon cancer Neg Hx    Social History[2]   Review of Systems  Constitutional: Negative.   HENT: Negative.   Eyes: Negative.    Respiratory: Negative.   Cardiovascular: Negative.   Gastrointestinal: Negative.   Genitourinary: Negative.   Musculoskeletal: Negative.   Skin: Negative.   Neurological: Negative.   Endo/Heme/Allergies: Negative.   Psychiatric/Behavioral: Negative.   Physical Exam HENT:     Head: Normocephalic.  Eyes:     General: No visual field deficit. Cardiovascular:     Rate and Rhythm: Regular rhythm.     Heart sounds: Normal heart sounds.  Neurological:     General: No focal deficit present.     Mental Status: She is alert and oriented to person, place, and time.     Cranial Nerves: Cranial nerves are intact. No cranial nerve deficit or facial asymmetry.     Sensory: Sensation is intact.     Motor: Motor function is intact. No weakness.     Coordination: Coordination is intact.     Gait: Gait is intact. Gait normal.     Deep Tendon Reflexes: Reflexes are normal and symmetric.     Reflex Scores:      Tricep reflexes are 2+ on the right side and 2+ on the left side.  Bicep reflexes are 2+ on the right side and 2+ on the left side.      Brachioradialis reflexes are 2+ on the right side and 2+ on the left side.      Patellar reflexes are 2+ on the right side and 2+ on the left side.      Achilles reflexes are 2+ on the right side and 2+ on the left side.  Radiology:  MRI cervical done on 01/03/24 ([facility]) show:  C2-C3: Normal   C3-C4: There is mild degenerative disc disease with moderate  bilateral facet arthropathy. There is a foraminal spur on the right.  There is moderate right neural foraminal stenosis. No significant  spinal stenosis   C4-C5: There is mild degenerative disc disease. There is moderate  facet arthropathy on the left. No spinal stenosis. There is mild  left neural foraminal stenosis   C5-C6: Prior ACDF with solid arthrodesis. No spinal stenosis or  foraminal stenosis   C6-C7: Prior ACDF with solid arthrodesis. No spinal stenosis or  foraminal stenosis    C7-T1: The disc is normal. There is mild right facet arthropathy. No  spinal stenosis or foraminal stenosis    Assessment/Plan: 1. Cervicalgia  AMB Referral to Pain Management     Pt doing better today. She had 1st appt for PT. I think her symptoms are more so rlt to her Facet arthropathy. No central stenosis appreciated on MRI so I am not sure of the reason for her hands clinging. May be CTS? Yet she also tells that even that is better now. Will refer her to Dr. Zub and see her as needed.   Kayla LITTIE Embs, NP 9/19/20259:37 AM           [1] Current Outpatient Medications  Medication Sig Dispense Refill  . albuterol  sulfate HFA (PROVENTIL ,VENTOLIN ,PROAIR ) 108 (90 Base) MCG/ACT inhaler Inhale 1-2 puffs into the lungs every 6 (six) hours as needed for Wheezing or Shortness of Breath.    SABRA aspirin (ECOTRIN LOW DOSE) EC tablet Take one tablet (81 mg dose) by mouth daily.    . atorvastatin  (LIPITOR) 20 mg tablet Take one tablet (20 mg dose) by mouth daily.    . cholecalciferol 25 MCG (1000 UT) capsule Take one capsule (1,000 Units dose) by mouth daily.    . esomeprazole  magnesium  (NEXIUM ) 40 mg capsule Take 1 capsule (40 mg total) by mouth 2 (two) times daily.    . fluticasone  propionate (FLONASE ) 50 mcg/actuation nasal spray 2 sprays by Nasal route daily. Spray in each nostril.    . MAGNESIUM  PO Take by mouth daily.    . polyethylene glycol (MIRALAX,GAVILAX,CLEARLAX) 17 GM/SCOOP powder Take by mouth.    . Probiotic Product (PROMELLA IN PREBIOTIC) CAPS daily.    SABRA tiZANidine (ZANAFLEX) 4 mg tablet Take one tablet (4 mg dose) by mouth every 8 (eight) hours as needed for up to 30 days. 90 tablet 0  . vitamin B-12 (CYANOCOBALAMIN) 1000 mcg tablet Take one tablet (1,000 mcg dose) by mouth daily.    . Wheat Dextrin (BENEFIBER DRINK MIX) PACK Take 5 mg by mouth daily.     No current facility-administered medications for this visit.  [2] Social History Socioeconomic History  . Marital  status: Married  Tobacco Use  . Smoking status: Never  . Smokeless tobacco: Never  Substance and Sexual Activity  . Alcohol use: Yes    Alcohol/week: 6.0 standard drinks of alcohol    Comment: Socially  . Drug use: No  Social  History Narrative   Retired   Married

## 2024-01-05 ENCOUNTER — Encounter

## 2024-01-08 ENCOUNTER — Encounter: Payer: Self-pay | Admitting: Gastroenterology

## 2024-01-08 ENCOUNTER — Ambulatory Visit (INDEPENDENT_AMBULATORY_CARE_PROVIDER_SITE_OTHER): Admitting: Gastroenterology

## 2024-01-08 VITALS — BP 132/64 | HR 75 | Ht 65.0 in | Wt 193.0 lb

## 2024-01-08 DIAGNOSIS — Z860101 Personal history of adenomatous and serrated colon polyps: Secondary | ICD-10-CM

## 2024-01-08 DIAGNOSIS — K5900 Constipation, unspecified: Secondary | ICD-10-CM

## 2024-01-08 DIAGNOSIS — Z860100 Personal history of colon polyps, unspecified: Secondary | ICD-10-CM

## 2024-01-08 DIAGNOSIS — K219 Gastro-esophageal reflux disease without esophagitis: Secondary | ICD-10-CM | POA: Diagnosis not present

## 2024-01-08 MED ORDER — NA SULFATE-K SULFATE-MG SULF 17.5-3.13-1.6 GM/177ML PO SOLN: 1.0000 | Freq: Once | ORAL | 0 refills | Status: AC

## 2024-01-08 NOTE — Progress Notes (Addendum)
 01/08/2024 Kayla Rosales [id number] 04-17-1955   HISTORY OF PRESENT ILLNESS: This is a 68 year old female is a patient of Dr. Darilyn.  She is here today to discuss and schedule colonoscopy.  Last was October 2022 as below with repeat recommended in 3 years.  She also has acid reflux.  She takes Nexium  40 mg daily.  She says that it works well for her symptoms as long as she takes it.  Had an EGD in 2022 that was normal.  Says that sometimes if she goes on vacation or something she forgets to take it so then will use Tums, but otherwise does well with it.  She says that her bowel habits always tend towards what she considers constipation.  She says that she goes multiple times a day and the stools are not hard, but seem to take a long time to move through (she has a redundant colon).  She drinks a lot of water and eats a lot of fiber.  She also takes Benefiber powder.  She says when she took MiraLAX every day previously it seemed to give her loose stools multiple times a day.  Colonoscopy 01/2021:  - Three 4 to 7 mm polyps in the transverse colon and in the cecum, removed with a cold snare. Resected and retrieved. - Diverticulosis in the sigmoid colon and in the descending colon. - Redundant colon. - The examination was otherwise normal on direct and retroflexion views. - Personal history of colonic polyps. Patient was having periumbilical cramps in recovery - hyoscyamine adminsitered - abd was soft and mod tender w/o rebound.  Surgical [P], colon, transverse, cecal, polyp (3) - SESSILE SERRATED POLYP(S) WITHOUT CYTOLOGIC DYSPLASIA  Repeat recommended in 3 years.   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 Still unclear what is cuasing her pain. May be nerve related. May benefit from starting on gabapentin. Start gabapentin 300 mg TID. F/u 4 weeks   Night sweats    Osteopenia    Palpitations    Recurrent upper respiratory infection (URI)    Right arm weakness 09/28/2015   Last Assessment & Plan:  Formatting of this note might be different from the original. Referral to PT today. 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    Vertigo 08/11/2015   Last Assessment & Plan:  Formatting of this note might be different from the original. Concern given gradual onset over the last week and a half in addition to neck pain. Patient needs neuroimaging to rule out dissecting vertebral aneurysm. Patient advised to  go to emergency room. She declined ambulance transport   Vitamin B 12 deficiency    Vitamin D  deficiency    Past Surgical History:  Procedure Laterality Date   CARPAL TUNNEL RELEASE     [facility] [place] yr repeat recommended   COLONOSCOPY W/ POLYPECTOMY  05/26/2018   x5 polyps   KNEE ARTHROSCOPY Bilateral 2013   meniscus  x2   NECK SURGERY  2018   disc fusion   UPPER GASTROINTESTINAL ENDOSCOPY      reports that she has never smoked. She has never been exposed to tobacco smoke. She has never used smokeless tobacco. She reports current alcohol use of about 1.0 standard drink of alcohol per week. She reports that she does not use drugs. family history includes Anxiety disorder in her mother;  Arthritis in her father; Barrett's esophagus in her son; Breast cancer in her cousin; COPD in her sister; Depression in her mother; Depression (age of onset: 16) in her sister; Diabetes in her maternal grandmother; Drug abuse in her sister; Early death in her maternal grandfather, maternal grandmother, and sister; Early death (age of onset: 12) in her mother; Heart disease in her father, maternal grandfather, and mother; Hypertension in her father and mother; Lung cancer in her sister; Mental illness in her sister; Obesity in her mother; Stroke in her father; Thyroid  disease in her mother. Allergies  Allergen Reactions   Hydromorphone Other (See Comments)    HYPOTENSION   Meperidine Other (See Comments)    BP drops   Other Other (See Comments)    Pt thinks she has hx of allergic reaction   States she does not know the reaction   Sulfa Antibiotics Other (See Comments)    States she does not know the reaction       Outpatient Encounter Medications as of 01/08/2024  Medication Sig   albuterol  (VENTOLIN  HFA) 108 (90 Base) MCG/ACT inhaler TAKE 2 PUFFS BY MOUTH EVERY 6 HOURS AS NEEDED FOR WHEEZE OR SHORTNESS OF BREATH   aspirin EC 81 MG tablet Take 81 mg by mouth daily.   atorvastatin  (LIPITOR) 20 MG tablet Take 1 tablet (20 mg total) by mouth daily.   cetirizine (ZYRTEC) 5 MG tablet Take 5 mg by mouth daily as needed for allergies.   Cholecalciferol 25 MCG (1000 UT) capsule Take 1,000 Units by mouth daily.   cyanocobalamin 1000 MCG tablet Take 500 mcg by mouth daily.   esomeprazole  (NEXIUM ) 40 MG capsule Take 1 capsule (40 mg total) by mouth daily at 12 noon.   fluticasone  (FLONASE ) 50 MCG/ACT nasal spray Place 2 sprays into both nostrils daily.   glucosamine-chondroitin 500-400 MG tablet Take 1 tablet by mouth daily.   Magnesium  250 MG TABS Take 1 tablet by mouth daily.   Multiple Vitamins-Minerals (ALIVE WOMENS 50+ PO) Take by mouth daily.   Na Sulfate-K Sulfate-Mg Sulfate concentrate  (SUPREP) 17.5-3.13-1.6 GM/177ML SOLN Take 1 kit (354 mLs total) by mouth once for 1 dose.   Probiotic Product (PROBIOTIC ADVANCED) CAPS daily.    No facility-administered encounter medications on file as of 01/08/2024.    REVIEW OF SYSTEMS  : All other systems reviewed and negative except where noted in the History of Present Illness.   PHYSICAL EXAM: BP 132/64   Pulse 75  General: Well developed white female in no acute distress Head: Normocephalic and atraumatic Eyes:  Sclerae anicteric, conjunctiva pink. Ears: Normal auditory acuity Lungs: Clear throughout to auscultation; no  W/R/R. Heart: Regular rate and rhythm; no M/R/G. Abdomen: Soft, non-distended.  BS present.  Non-tender. Rectal:  Will be done at the time of colonoscopy. Musculoskeletal: Symmetrical with no gross deformities  Skin: No lesions on visible extremities Extremities: No edema  Neurological: Alert oriented x 4, grossly non-focal Psychological:  Alert and cooperative. Normal mood and affect  ASSESSMENT AND PLAN: *Personal history of colon polyps: Has history of sessile serrated polyps removed in October 2022 with a 3-year recall recommended.  Will schedule with Dr. Avram.  The risks, benefits, and alternatives to colonoscopy were discussed with the patient and she consents to proceed.  *Constipation: Takes Benefiber daily, felt like MiraLAX previously gave her loose stools.  Advised to maybe cut back to half a dose daily and try that instead in addition to her Benefiber. *GERD: Well-controlled on Nexium  40 mg daily.   CC:  Rosales, Kayla A, DO      [facility] Attending  I agree with the Advanced Practitioner's note, impression and recommendations with the following additions:  1) Given her constipation issues we should add a MiraLax purge on the day before prep day - 2 days before colonoscopy (modified double prep) 2) Place abdominal binder at time of colonoscopy to see if that allows for better scope  passage.  Kayla CHARLENA Avram, MD, [facility]

## 2024-01-08 NOTE — Patient Instructions (Addendum)
 You have been scheduled for a colonoscopy. Please follow written instructions given to you at your visit today.   If you use inhalers (even only as needed), please bring them with you on the day of your procedure.  DO NOT TAKE 7 DAYS PRIOR TO TEST- Trulicity (dulaglutide) Ozempic, Wegovy (semaglutide) Mounjaro (tirzepatide) Bydureon Bcise (exanatide extended release)  DO NOT TAKE 1 DAY PRIOR TO YOUR TEST Rybelsus (semaglutide) Adlyxin (lixisenatide) Victoza (liraglutide) Byetta (exanatide) ___________________________________________________________________________  _______________________________________________________  If your blood pressure at your visit was 140/90 or greater, please contact your primary care physician to follow up on this.  _______________________________________________________  If you are age 51 or older, your body mass index should be between 23-30. Your There is no height or weight on file to calculate BMI. If this is out of the aforementioned range listed, please consider follow up with your Primary Care Provider.  If you are age 99 or younger, your body mass index should be between 19-25. Your There is no height or weight on file to calculate BMI. If this is out of the aformentioned range listed, please consider follow up with your Primary Care Provider.   ________________________________________________________  The [facility] providers would like to encourage you to use MYCHART to communicate with providers for non-urgent requests or questions.  Due to long hold times on the telephone, sending your provider a message by [facility] may be a faster and more efficient way to get a response.  Please allow 48 business hours for a response.  Please remember that this is for non-urgent requests.  _______________________________________________________  [facility] is using a team-based approach to care.  Your team is made up of your doctor and two to  three APPS. Our APPS (Nurse Practitioners and Physician Assistants) work with your physician to ensure care continuity for you. They are fully qualified to address your health concerns and develop a treatment plan. They communicate directly with your gastroenterologist to care for you. Seeing the Advanced Practice Practitioners on your physician's team can help you by facilitating care more promptly, often allowing for earlier appointments, access to diagnostic testing, procedures, and other specialty referrals.

## 2024-01-20 ENCOUNTER — Telehealth: Payer: Self-pay | Admitting: *Deleted

## 2024-01-20 NOTE — Telephone Encounter (Signed)
-----   Message from Lemay D. Zehr sent at 01/15/2024  1:10 PM EDT ----- Regarding: FW: extra prep Shephanie Romas, can you instruct her on additional prep? ----- Message ----- From: Avram Lupita BRAVO, MD Sent: 01/14/2024   8:56 PM EDT To: Harlene JONETTA Mail, PA-C Subject: extra prep                                     Jess,  Last time I scoped this lady she had a double prep.  Please add on a MiraLAx purge to be taken afternoon or evening of day before prep or 2 days before colonoscopy - I wrote an addendum to that effect.  Thanks  Lupita

## 2024-01-20 NOTE — Telephone Encounter (Signed)
 Spoke with patient and she agreed to extra prep. Instructions sent via Mychart.

## 2024-01-20 NOTE — Telephone Encounter (Signed)
 Team,  This pt is scheduled with CG on 10/9.  She has a PFO.  Thanks,  Norleen EMERSON Schillings

## 2024-01-21 NOTE — Progress Notes (Unsigned)
 [facility] History and Physical   Primary Care Physician:  Catherine Charlies LABOR, DO   Reason for Procedure:   Hx colon polyps  Plan:    Colonoscopy - use abdominal binder.     HPI: Kayla Rosales is a 68 y.o. female w/ hx colon polyps - 3ssp in 2022 , 5 ssp 2020- here for surveillance exam. She has a redundant colon and has had to go supine w/ abdominal pressure for completion. She also has a PFO.   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 Still unclear what is cuasing her pain. May be nerve related. May benefit from starting on gabapentin. Start gabapentin 300 mg TID. F/u 4 weeks   Night sweats    Osteopenia    Palpitations    Recurrent upper respiratory infection (URI)    Right arm weakness 09/28/2015   Last Assessment & Plan:  Formatting of this note might be different from the original. Referral to PT today. 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    Vertigo 08/11/2015   Last Assessment & Plan:  Formatting of this note might be different from the original. Concern given gradual onset over the last week and a half in addition to neck pain. Patient needs neuroimaging to rule out dissecting vertebral aneurysm. Patient  advised to go to emergency room. She declined ambulance transport   Vitamin B 12 deficiency    Vitamin D  deficiency     Past Surgical History:  Procedure Laterality Date   CARPAL TUNNEL RELEASE     [facility] [place] 5 yr repeat recommended   COLONOSCOPY W/ POLYPECTOMY  05/26/2018   x5 polyps   KNEE ARTHROSCOPY Bilateral 2013   meniscus  x2   NECK SURGERY  2018   disc fusion   UPPER GASTROINTESTINAL ENDOSCOPY       Current Outpatient Medications  Medication Sig Dispense Refill   aspirin EC 81 MG tablet Take 81 mg by mouth daily.     atorvastatin  (LIPITOR) 20 MG tablet Take 1 tablet (20 mg total) by mouth daily. 90 tablet 3   Cholecalciferol 25 MCG (1000 UT) capsule Take 1,000 Units by mouth daily.     cyanocobalamin 1000 MCG tablet Take 500 mcg by mouth daily.     esomeprazole  (NEXIUM ) 40 MG capsule Take 1 capsule (40 mg total) by mouth daily at 12 noon. 90 capsule 3   fluticasone  (FLONASE ) 50 MCG/ACT nasal spray Place 2 sprays into both nostrils daily.     Magnesium  250 MG  TABS Take 1 tablet by mouth daily.     Probiotic Product (PROBIOTIC ADVANCED) CAPS daily.      albuterol  (VENTOLIN  HFA) 108 (90 Base) MCG/ACT inhaler TAKE 2 PUFFS BY MOUTH EVERY 6 HOURS AS NEEDED FOR WHEEZE OR SHORTNESS OF BREATH 8.5 each 0   cetirizine (ZYRTEC) 5 MG tablet Take 5 mg by mouth daily as needed for allergies.     glucosamine-chondroitin 500-400 MG tablet Take 1 tablet by mouth daily.     Multiple Vitamins-Minerals (ALIVE WOMENS 50+ PO) Take by mouth daily.     Current Facility-Administered Medications  Medication Dose Route Frequency Provider Last Rate Last Admin   0.9 %  sodium chloride  infusion  500 mL Intravenous Once Avram Lupita BRAVO, MD        Allergies as of 01/22/2024 - Review Complete 01/22/2024  Allergen Reaction Noted   Hydromorphone Other (See Comments) 11/19/2016   Meperidine Other (See Comments) 10/23/2012   Other Other (See Comments) 08/11/2006    Sulfa antibiotics Other (See Comments) 08/11/2006    Family History  Problem Relation Age of Onset   Depression Mother    Heart disease Mother    Early death Mother 36   Hypertension Mother    Thyroid  disease Mother    Anxiety disorder Mother    Obesity Mother    Arthritis Father    Heart disease Father    Hypertension Father    Stroke Father    Depression Sister 20   Early death Sister    Drug abuse Sister    Lung cancer Sister    Mental illness Sister    COPD Sister    Diabetes Maternal Grandmother    Early death Maternal Grandmother    Early death Maternal Grandfather    Heart disease Maternal Grandfather    Breast cancer Cousin    Barrett's esophagus Son    Colon cancer Neg Hx    Esophageal cancer Neg Hx    Stomach cancer Neg Hx    Pancreatic cancer Neg Hx    Rectal cancer Neg Hx     Social History   Socioeconomic History   Marital status: Married    Spouse name: Kayla Rosales   Number of children: 3   Years of education: Not on file   Highest education level: Bachelor's degree (e.g., BA, AB, BS)  Occupational History   Occupation: retired Runner, broadcasting/film/video  Tobacco Use   Smoking status: Never    Passive exposure: Never   Smokeless tobacco: Never  Vaping Use   Vaping status: Never Used  Substance and Sexual Activity   Alcohol use: Yes    Alcohol/week: 1.0 standard drink of alcohol    Types: 1 Glasses of wine per week   Drug use: Never   Sexual activity: Yes    Partners: Male    Birth control/protection: Post-menopausal    Comment: married,intercourse age 54, less than 5 sexual partners,des neg  Other Topics Concern   Not on file  Social History Narrative   Married second family - 8 kids 14 grandkids blended   Probation officer, retired Runner, broadcasting/film/video   Caffeine 2/day   Smoker - never   EtOH - rare   No drugs   Wears her seatbelt, smoke alarm at home.   Feels safe in her relationships.   Social Drivers of Corporate investment banker Strain: Low Risk  (06/09/2023)    Overall Financial Resource Strain (CARDIA)    Difficulty of Paying Living Expenses: Not hard at all  Food Insecurity: No Food Insecurity (06/09/2023)   Hunger Vital Sign    Worried About Running Out of Food in the Last Year: Never true    Ran Out of Food in the Last Year: Never true  Transportation Needs: No Transportation Needs (06/09/2023)   PRAPARE - Administrator, Civil Service (Medical): No    Lack of Transportation (Non-Medical): No  Physical Activity: Unknown (06/09/2023)   Exercise Vital Sign    Days of Exercise per Week: 0 days    Minutes of Exercise per Session: Not on file  Stress: No Stress Concern Present (06/09/2023)   [employer] of Occupational Health - Occupational Stress Questionnaire    Feeling of Stress : Not at all  Social Connections: Moderately Integrated (06/09/2023)   Social Connection and Isolation Panel    Frequency of Communication with Friends and Family: More than three times a week    Frequency of Social Gatherings with Friends and Family: More than three times a week    Attends Religious Services: More than 4 times per year    Active Member of [employer] or Organizations: No    Attends Banker Meetings: Not on file    Marital Status: Married  Intimate Partner Violence: Unknown (01/30/2023)   Received from [facility]   HITS    Physically Hurt: Not on file    Insult or Talk Down To: Not on file    Threaten Physical Harm: Not on file    Scream or Curse: Not on file    Review of Systems:  All other review of systems negative except as mentioned in the HPI.  Physical Exam: Vital signs BP (!) 126/96   Pulse 64   Temp (!) 97.3 F (36.3 C) (Temporal)   Resp 16   Ht 5' 5 (1.651 m)   Wt 193 lb (87.5 kg)   SpO2 (!) 65%   BMI 32.12 kg/m   General:   Alert,  Well-developed, well-nourished, pleasant and cooperative in NAD Lungs:  Clear throughout to auscultation.   Heart:  Regular rate and rhythm; no murmurs, clicks,  rubs,  or gallops. Abdomen:  Soft, nontender and nondistended. Normal bowel sounds.   Neuro/Psych:  Alert and cooperative. Normal mood and affect. A and O x 3   @Kathlynn Swofford  CHARLENA Commander, MD, [facility] [phone] (pager) 01/22/2024 9:39 AM@

## 2024-01-22 ENCOUNTER — Ambulatory Visit (AMBULATORY_SURGERY_CENTER): Admitting: Internal Medicine

## 2024-01-22 ENCOUNTER — Encounter: Payer: Self-pay | Admitting: Internal Medicine

## 2024-01-22 VITALS — BP 135/66 | HR 53 | Temp 97.3°F | Resp 16 | Ht 65.0 in | Wt 193.0 lb

## 2024-01-22 DIAGNOSIS — K5730 Diverticulosis of large intestine without perforation or abscess without bleeding: Secondary | ICD-10-CM

## 2024-01-22 DIAGNOSIS — K562 Volvulus: Secondary | ICD-10-CM | POA: Diagnosis not present

## 2024-01-22 DIAGNOSIS — Z860100 Personal history of colon polyps, unspecified: Secondary | ICD-10-CM

## 2024-01-22 DIAGNOSIS — Z860101 Personal history of adenomatous and serrated colon polyps: Secondary | ICD-10-CM

## 2024-01-22 DIAGNOSIS — Z1211 Encounter for screening for malignant neoplasm of colon: Secondary | ICD-10-CM

## 2024-01-22 MED ORDER — SODIUM CHLORIDE 0.9 % IV SOLN: 500.0000 mL | Freq: Once | INTRAVENOUS | Status: DC

## 2024-01-22 NOTE — Patient Instructions (Addendum)
 No polyps seen today.  You still have your diverticulosis.  I am recommending a routine repeat colonoscopy in 5 years.  I appreciate the opportunity to care for you. Kayla CHARLENA Commander, MD, [facility]   Handouts given: Diverticulosis Resume previous diet. Continue present medications.  Repeat colonoscopy in 5 years for surveillance.   YOU HAD AN ENDOSCOPIC PROCEDURE TODAY AT THE [facility]:   Refer to the procedure report that was given to you for any specific questions about what was found during the examination.  If the procedure report does not answer your questions, please call your gastroenterologist to clarify.  If you requested that your care partner not be given the details of your procedure findings, then the procedure report has been included in a sealed envelope for you to review at your convenience later.  YOU SHOULD EXPECT: Some feelings of bloating in the abdomen. Passage of more gas than usual.  Walking can help get rid of the air that was put into your GI tract during the procedure and reduce the bloating. If you had a lower endoscopy (such as a colonoscopy or flexible sigmoidoscopy) you may notice spotting of blood in your stool or on the toilet paper. If you underwent a bowel prep for your procedure, you may not have a normal bowel movement for a few days.  Please Note:  You might notice some irritation and congestion in your nose or some drainage.  This is from the oxygen used during your procedure.  There is no need for concern and it should clear up in a day or so.  SYMPTOMS TO REPORT IMMEDIATELY:  Following lower endoscopy (colonoscopy or flexible sigmoidoscopy):  Excessive amounts of blood in the stool  Significant tenderness or worsening of abdominal pains  Swelling of the abdomen that is new, acute  Fever of 100F or higher  For urgent or emergent issues, a gastroenterologist can be reached at any hour by calling [phone]. Do not use MyChart  messaging for urgent concerns.    DIET:  We do recommend a small meal at first, but then you may proceed to your regular diet.  Drink plenty of fluids but you should avoid alcoholic beverages for 24 hours.  ACTIVITY:  You should plan to take it easy for the rest of today and you should NOT DRIVE or use heavy machinery until tomorrow (because of the sedation medicines used during the test).    FOLLOW UP: Our staff will call the number listed on your records the next business day following your procedure.  We will call around 7:15- 8:00 am to check on you and address any questions or concerns that you may have regarding the information given to you following your procedure. If we do not reach you, we will leave a message.     If any biopsies were taken you will be contacted by phone or by letter within the next 1-3 weeks.  Please call us  at [phone] if you have not heard about the biopsies in 3 weeks.    SIGNATURES/CONFIDENTIALITY: You and/or your care partner have signed paperwork which will be entered into your electronic medical record.  These signatures attest to the fact that that the information above on your After Visit Summary has been reviewed and is understood.  Full responsibility of the confidentiality of this discharge information lies with you and/or your care-partner.

## 2024-01-22 NOTE — Progress Notes (Signed)
 Transferred to PACU via stretcher, arousing, VSS.

## 2024-01-22 NOTE — Op Note (Addendum)
 [facility] Patient Name: Kayla Rosales Procedure Date: 01/22/2024 9:22 AM MRN: [id number] Endoscopist: Lupita FORBES Commander , MD, [id number] Age: 68 Referring MD:  Date of Birth: 1955-10-29 Gender: Female Account #: [id number] Procedure:                Colonoscopy Indications:              High risk colon cancer surveillance: Personal                            history of colonic polyps, Last colonoscopy: 2022 Medicines:                Monitored Anesthesia Care Procedure:                Pre-Anesthesia Assessment:                           - Prior to the procedure, a History and Physical                            was performed, and patient medications and                            allergies were reviewed. The patient's tolerance of                            previous anesthesia was also reviewed. The risks                            and benefits of the procedure and the sedation                            options and risks were discussed with the patient.                            All questions were answered, and informed consent                            was obtained. Prior Anticoagulants: The patient has                            taken no anticoagulant or antiplatelet agents. ASA                            Grade Assessment: II - A patient with mild systemic                            disease. After reviewing the risks and benefits,                            the patient was deemed in satisfactory condition to                            undergo the procedure.  After obtaining informed consent, the colonoscope                            was passed under direct vision. Throughout the                            procedure, the patient's blood pressure, pulse, and                            oxygen saturations were monitored continuously. The                            Olympus Scope [id number] was introduced through the                            anus and  advanced to the the cecum, identified by                            appendiceal orifice and ileocecal valve. The                            colonoscopy was somewhat difficult due to a                            redundant colon and significant looping. Successful                            completion of the procedure was aided by applying                            abdominal pressure and using an abdominal binder.                            The patient tolerated the procedure well. The                            quality of the bowel preparation was good. The                            ileocecal valve, appendiceal orifice, and rectum                            were photographed. The bowel preparation used was                            SUPREP via split dose instruction. + MiraLax purge                            day before prep. Scope In: 9:42:26 AM Scope Out: 10:03:54 AM Scope Withdrawal Time: 0 hours 12 minutes 18 seconds  Total Procedure Duration: 0 hours 21 minutes 28 seconds  Findings:                 The perianal and digital rectal examinations were  normal.                           Multiple diverticula were found in the sigmoid                            colon and descending colon. There was narrowing of                            the colon in association with the diverticular                            opening.                           The colon (entire examined portion) was redundant.                           The exam was otherwise without abnormality on                            direct and retroflexion views. Complications:            No immediate complications. Estimated Blood Loss:     Estimated blood loss: none. Impression:               - Severe diverticulosis in the sigmoid colon and in                            the descending colon. There was narrowing of the                            colon in association with the diverticular opening.                            - Redundant colon.                           - The examination was otherwise normal on direct                            and retroflexion views.                           - No specimens collected.                           - Personal history of colonic polyps. 3ssp in 2022                            , 5 ssp 2020 Recommendation:           - Patient has a contact number available for                            emergencies. The signs and symptoms of potential  delayed complications were discussed with the                            patient. Return to normal activities tomorrow.                            Written discharge instructions were provided to the                            patient.                           - Resume previous diet.                           - Continue present medications.                           - Repeat colonoscopy in 5 years for surveillance.                            Would use abdominal binder + adult scope again and                            low air (has had problems with retained air in past                            and prior to using abdominal binder had to move                            supine + abdominal pressure) Lupita FORBES Commander, MD 01/22/2024 10:14:03 AM This report has been signed electronically.

## 2024-01-23 ENCOUNTER — Telehealth: Payer: Self-pay

## 2024-01-23 NOTE — Telephone Encounter (Signed)
  Follow up Call-     01/22/2024    7:57 AM  Call back number  Post procedure Call Back phone  # [phone]  Permission to leave phone message Yes     Patient questions:  Do you have a fever, pain , or abdominal swelling? No. Pain Score  0 *  Have you tolerated food without any problems? Yes.    Have you been able to return to your normal activities? Yes.    Do you have any questions about your discharge instructions: Diet   No. Medications  No. Follow up visit  No.  Do you have questions or concerns about your Care? No.  Actions: * If pain score is 4 or above: No action needed, pain <4.

## 2024-02-10 ENCOUNTER — Ambulatory Visit (INDEPENDENT_AMBULATORY_CARE_PROVIDER_SITE_OTHER): Admitting: Radiology

## 2024-02-10 ENCOUNTER — Ambulatory Visit: Admitting: Nurse Practitioner

## 2024-02-10 ENCOUNTER — Encounter: Payer: Self-pay | Admitting: Radiology

## 2024-02-10 VITALS — BP 114/70 | HR 90 | Temp 98.3°F | Wt 189.0 lb

## 2024-02-10 DIAGNOSIS — R350 Frequency of micturition: Secondary | ICD-10-CM | POA: Diagnosis not present

## 2024-02-10 DIAGNOSIS — N949 Unspecified condition associated with female genital organs and menstrual cycle: Secondary | ICD-10-CM | POA: Diagnosis not present

## 2024-02-10 LAB — URINALYSIS, COMPLETE W/RFL CULTURE
Bacteria, UA: NONE SEEN /HPF
Bilirubin Urine: NEGATIVE
Casts: NONE SEEN /LPF
Crystals: NONE SEEN /HPF
Glucose, UA: NEGATIVE
Hgb urine dipstick: NEGATIVE
Ketones, ur: NEGATIVE
Leukocyte Esterase: NEGATIVE
Nitrites, Initial: NEGATIVE
Protein, ur: NEGATIVE
RBC / HPF: NONE SEEN /HPF (ref 0–2)
Specific Gravity, Urine: 1.01 (ref 1.001–1.035)
WBC, UA: NONE SEEN /HPF (ref 0–5)
Yeast: NONE SEEN /HPF
pH: 7 (ref 5.0–8.0)

## 2024-02-10 LAB — NO CULTURE INDICATED

## 2024-02-10 LAB — WET PREP FOR TRICH, YEAST, CLUE

## 2024-02-10 NOTE — Progress Notes (Signed)
 Subjective: Kayla Rosales is a 68 y.o. female who complains of urinary frequency, leaking urine, vulvar irritation, LLQ pain yesterday. Symptoms began after having colonoscopy 2weeks ago--symptoms increased over the weekend. No bowel changes, fever or chills.    Review of Systems  All other systems reviewed and are negative.   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 Still unclear what is cuasing her pain. May be nerve related. May benefit from starting on gabapentin. Start gabapentin 300 mg TID. F/u 4 weeks   Night sweats    Osteopenia    Palpitations    Recurrent upper respiratory infection (URI)    Right arm weakness 09/28/2015   Last Assessment & Plan:  Formatting of this note might be different from the original. Referral to PT today. 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    Vertigo 08/11/2015   Last Assessment & Plan:  Formatting of this note might be different from the original. Concern given gradual onset over the last week and a half in addition to neck pain. Patient needs neuroimaging to rule out dissecting vertebral aneurysm. Patient advised to go to emergency room. She declined ambulance transport   Vitamin B 12  deficiency    Vitamin D  deficiency       Objective:  Today's Vitals   02/10/24 1155  BP: 114/70  Pulse: 90  Temp: 98.3 F (36.8 C)  TempSrc: Oral  SpO2: 97%  Weight: 189 lb (85.7 kg)   Body mass index is 31.45 kg/m.   Physical Exam Vitals and nursing note reviewed. Exam conducted with a chaperone present.  Constitutional:      Appearance: Normal appearance. She is well-developed.  Pulmonary:     Effort: Pulmonary effort is normal.  Abdominal:     General: Abdomen is flat.     Palpations: Abdomen is soft.  Genitourinary:    General: Normal vulva.     Vagina: No vaginal discharge, erythema, bleeding or lesions.     Cervix: Normal. No discharge, friability, lesion or erythema.     Uterus: Normal.      Adnexa: Right adnexa normal and left adnexa normal.  Neurological:     Mental Status: She is alert.  Psychiatric:        Mood and Affect: Mood normal.        Thought Content: Thought content normal.        Judgment: Judgment normal.     Urine dipstick shows negative for all components.  Micro exam: negative for WBC's or RBC's.  Microscopic wet-mount exam shows negative for pathogens, normal epithelial cells.   Darice Hoit, CMA present for exam  Assessment:/Plan:  1. Urinary frequency (Primary) - Urinalysis,Complete w/RFL Culture  2. Vaginal discomfort - WET PREP FOR TRICH, YEAST, CLUE   Reassured all testing negative. Follow up with GI if symptoms persist. Avoid intercourse until symptoms are resolved. Avoid the use of soaps or perfumed products in the peri area. Avoid tub baths and sitting in sweaty or wet clothing for prolonged periods of time.     Tracy Kinner B, NP 1:16 PM

## 2024-02-11 ENCOUNTER — Telehealth: Payer: Self-pay | Admitting: Gastroenterology

## 2024-02-11 DIAGNOSIS — R1032 Left lower quadrant pain: Secondary | ICD-10-CM

## 2024-02-11 DIAGNOSIS — R10814 Left lower quadrant abdominal tenderness: Secondary | ICD-10-CM

## 2024-02-11 NOTE — Telephone Encounter (Signed)
 CT AP w contrast orders placed for ASAP. Message sent to radiology schedulers to schedule pt.

## 2024-02-11 NOTE — Telephone Encounter (Signed)
 Spoke to patient - worsening LLQ pain since colonoscopy  Some tenderness  No fever  No change in bowel habits  ER precautions given  Low fiber diet   Needs :  CT abd/pelvis w/ contrast (urgent - tomorrow w/ reading same day)  LLQ pain LLQ tenderness  R/Odiverticulitis

## 2024-02-11 NOTE — Telephone Encounter (Signed)
 Inbound call from patient stating that she is having a lot of abdominal pain on her left side as well as having bladder pressure since she had her Colonoscopy. Patient is wishing to speak to Harlene Mail about this inforamtion. Please advise.

## 2024-02-11 NOTE — Telephone Encounter (Signed)
 Spoke with pt. She reports that since her colonoscopy 2 weeks ago she has been having LLQ abd pain/pressure. Pain is dull to sharp at times. Tenderness to palpation. Is having bms, but feels like she is straining. Reports this is normal for her. Denies blood in stool. Also reports bladder pressure and some bladder leakage. Saw GYN yesterday. Negative for UTI and yeast infection.

## 2024-02-13 ENCOUNTER — Ambulatory Visit (HOSPITAL_BASED_OUTPATIENT_CLINIC_OR_DEPARTMENT_OTHER)
Admission: RE | Admit: 2024-02-13 | Discharge: 2024-02-13 | Disposition: A | Discharge status: Home / Self Care | Source: Ambulatory Visit | Attending: Internal Medicine | Admitting: Internal Medicine

## 2024-02-13 DIAGNOSIS — R10814 Left lower quadrant abdominal tenderness: Secondary | ICD-10-CM | POA: Diagnosis present

## 2024-02-13 DIAGNOSIS — R1032 Left lower quadrant pain: Secondary | ICD-10-CM | POA: Insufficient documentation

## 2024-02-13 LAB — POCT I-STAT CREATININE: Creatinine, Ser: 0.7 mg/dL (ref 0.44–1.00)

## 2024-02-13 MED ORDER — IOHEXOL 300 MG/ML  SOLN
80.0000 mL | Freq: Once | INTRAMUSCULAR | Status: AC | PRN
  Administered 2024-02-13: 100 mL via INTRAVENOUS

## 2024-02-15 ENCOUNTER — Ambulatory Visit: Payer: Self-pay | Admitting: Internal Medicine

## 2024-02-18 ENCOUNTER — Other Ambulatory Visit: Payer: Self-pay | Admitting: Family Medicine

## 2024-02-18 ENCOUNTER — Encounter: Payer: Self-pay | Admitting: Family Medicine

## 2024-02-18 NOTE — Telephone Encounter (Signed)
 Copied from CRM [id number]. Topic: Clinical - Medication Refill >> Feb 18, 2024  1:20 PM Shereese L wrote: Medication: Albuterol  inhaler  Has the patient contacted their pharmacy? Yes (Agent: If no, request that the patient contact the pharmacy for the refill. If patient does not wish to contact the pharmacy document the reason why and proceed with request.) (Agent: If yes, when and what did the pharmacy advise?)  This is the patient's preferred pharmacy:  [id number] - [place], [place] - [address] [place] [address] [place] [address] [place] [address] Phone: [phone] Fax: [phone]  Is this the correct pharmacy for this prescription? Yes If no, delete pharmacy and type the correct one.   Has the prescription been filled recently? Yes  Is the patient out of the medication? Yes  Has the patient been seen for an appointment in the last year OR does the patient have an upcoming appointment? Yes  Can we respond through MyChart? Yes  Agent: Please be advised that Rx refills may take up to 3 business days. We ask that you follow-up with your pharmacy.

## 2024-02-19 ENCOUNTER — Other Ambulatory Visit: Payer: Self-pay

## 2024-02-19 MED ORDER — ALBUTEROL SULFATE HFA 108 (90 BASE) MCG/ACT IN AERS: INHALATION_SPRAY | RESPIRATORY_TRACT | 0 refills | Status: AC

## 2024-02-20 ENCOUNTER — Ambulatory Visit: Payer: Self-pay

## 2024-02-20 NOTE — Telephone Encounter (Signed)
 FYI Only or Action Required?: FYI only for provider: Pt will go to UC.  Patient was last seen in primary care on 06/11/2023 by Catherine Fuller A, DO.  Called Nurse Triage reporting Wheezing and Cough.  Symptoms began a week ago.  Interventions attempted: Other: went to UC. Given antibiotics  Symptoms are: gradually worsening.  Triage Disposition: See HCP Within 4 Hours (Or PCP Triage)  Patient/caregiver understands and will follow disposition?: Yes                     Copied from CRM [id number]. Topic: Clinical - Red Word Triage >> Feb 20, 2024 10:44 AM Kayla Rosales wrote: Red Word that prompted transfer to Nurse Triage: Wheezing - has not gotten any better. Worse in the evenings. Severe fatigue, coughing. Been on amxocillin for six days. Reason for Disposition  Wheezing is present  Answer Assessment - Initial Assessment Questions 1. ONSET: When did the cough begin?      Weds 2. SEVERITY: How bad is the cough today?      moderate 3. SPUTUM: Describe the color of your sputum (e.g., none, dry cough; clear, white, yellow, green)     White 4. HEMOPTYSIS: Are you coughing up any blood? If Yes, ask: How much? (e.g., flecks, streaks, tablespoons, etc.)     no 5. DIFFICULTY BREATHING: Are you having difficulty breathing? If Yes, ask: How bad is it? (e.g., mild, moderate, severe)      Wheezing at night 6. FEVER: Do you have a fever? If Yes, ask: What is your temperature, how was it measured, and when did it start?     no 7. CARDIAC HISTORY: Do you have any history of heart disease? (e.g., heart attack, congestive heart failure)      no 8. LUNG HISTORY: Do you have any history of lung disease?  (e.g., pulmonary embolus, asthma, emphysema)     no  10. OTHER SYMPTOMS: Do you have any other symptoms? (e.g., runny nose, wheezing, chest pain)       Sore throat, fatigue, swallowing is painful  Protocols used: Cough - Acute Productive-A-AH

## 2024-02-24 ENCOUNTER — Encounter: Payer: Self-pay | Admitting: Internal Medicine

## 2024-02-25 ENCOUNTER — Ambulatory Visit: Admitting: Internal Medicine

## 2024-02-25 ENCOUNTER — Encounter: Payer: Self-pay | Admitting: Internal Medicine

## 2024-02-25 ENCOUNTER — Ambulatory Visit: Attending: Internal Medicine | Admitting: Internal Medicine

## 2024-02-25 VITALS — BP 120/76 | HR 69 | Resp 16 | Ht 65.0 in | Wt 194.0 lb

## 2024-02-25 DIAGNOSIS — R7301 Impaired fasting glucose: Secondary | ICD-10-CM | POA: Insufficient documentation

## 2024-02-25 DIAGNOSIS — I2510 Atherosclerotic heart disease of native coronary artery without angina pectoris: Secondary | ICD-10-CM | POA: Diagnosis present

## 2024-02-25 DIAGNOSIS — Q2112 Patent foramen ovale: Secondary | ICD-10-CM | POA: Insufficient documentation

## 2024-02-25 DIAGNOSIS — Z6830 Body mass index (BMI) 30.0-30.9, adult: Secondary | ICD-10-CM | POA: Insufficient documentation

## 2024-02-25 DIAGNOSIS — R0989 Other specified symptoms and signs involving the circulatory and respiratory systems: Secondary | ICD-10-CM | POA: Insufficient documentation

## 2024-02-25 DIAGNOSIS — E6609 Other obesity due to excess calories: Secondary | ICD-10-CM | POA: Insufficient documentation

## 2024-02-25 DIAGNOSIS — E785 Hyperlipidemia, unspecified: Secondary | ICD-10-CM | POA: Insufficient documentation

## 2024-02-25 DIAGNOSIS — R0602 Shortness of breath: Secondary | ICD-10-CM | POA: Insufficient documentation

## 2024-02-25 DIAGNOSIS — E66811 Obesity, class 1: Secondary | ICD-10-CM | POA: Diagnosis present

## 2024-02-25 NOTE — Progress Notes (Signed)
 Cardiology Office Note:  .   Date:  02/25/2024  ID:  Kayla Rosales, DOB April 08, 1956, MRN [id number] PCP: Catherine Charlies LABOR, DO  [facility] Providers Cardiologist:  Soyla LABOR Merck, MD    History of Present Illness: .   Kayla Rosales is a 68 y.o. female.  Discussed the use of AI scribe software for clinical note transcription with the patient, who gave verbal consent to proceed.  History of Present Illness Kayla Rosales is a 68 year old female with asthma and moderate nonobstructive coronary artery disease who presents with sleep disturbances and respiratory symptoms.  She experiences significant sleep disturbances, averaging four to five hours of sleep per night, leading to irritability, fatigue, and increased stress. Her husband's night terrors and sleep apnea contribute to her sleep issues, despite his use of a CPAP machine.  She experiences chest tightness and fatigue throughout the day, which she attributes to lack of sleep and recent URI. At night, she has episodes of heart pounding, described as feeling faster than normal, which eventually slows down without intervention.  Family history includes congestive heart failure and atrial fibrillation on her mother's side, and her father had palpitations and required a pacemaker. She is concerned about her risk of developing similar conditions.    ROS: negative except per HPI above.  Studies Reviewed: SABRA   EKG Interpretation Date/Time:  Wednesday February 25 2024 10:09:57 EST Ventricular Rate:  68 PR Interval:  160 QRS Duration:  78 QT Interval:  396 QTC Calculation: 421 R Axis:   -29  Text Interpretation: Normal sinus rhythm Normal ECG When compared with ECG of 24-Dec-2022 09:11, No significant change was found Confirmed by Merck Soyla ([id number]) on 02/25/2024 10:25:44 AM    Results LABS Hemoglobin: Normal (2025) Potassium: Normal (2025) Magnesium : Normal (2025)  RADIOLOGY Coronary CT:  moderate blockages without  flow limitations (09/2021)  DIAGNOSTIC Echocardiogram: Normal except for patent foramen ovale (2023) EKG: Normal (02/25/2024) Risk Assessment/Calculations:       Physical Exam:   VS:  BP 120/76 (BP Location: Right Arm, Patient Position: Sitting, Cuff Size: Large)   Pulse 69   Resp 16   Ht 5' 5 (1.651 m)   Wt 194 lb (88 kg)   SpO2 96%   BMI 32.28 kg/m    Wt Readings from Last 3 Encounters:  02/25/24 194 lb (88 kg)  02/10/24 189 lb (85.7 kg)  01/22/24 193 lb (87.5 kg)     Physical Exam GENERAL: Alert, cooperative, well developed, no acute distress. HEENT: Normocephalic, normal oropharynx, moist mucous membranes. CHEST: Clear to auscultation bilaterally, no wheezes, rhonchi, or crackles. CARDIOVASCULAR: Normal heart rate and rhythm, S1 and S2 normal without murmurs. ABDOMEN: Soft, non-tender, non-distended, without organomegaly, normal bowel sounds. EXTREMITIES: No cyanosis or edema. NEUROLOGICAL: Cranial nerves grossly intact, moves all extremities without gross motor or sensory deficit.   ASSESSMENT AND PLAN: .    Assessment and Plan Assessment & Plan Shortness of breath and wheezing, likely post-viral/reactive airway with history of asthma Symptoms likely post-viral/reactive airway disease. No bacterial infection. Prednisone  course completed without significant improvement. - Consider pulmonologist follow-up if symptoms persist or worsen. - Monitor symptoms post-prednisone  and after improving sleep.  Moderate coronary artery disease with palpitations and possible paroxysmal supraventricular tachycardia Moderate coronary artery disease with possible SVT. Palpitations possibly related to stress and lack of sleep. Normal EKG. Previous coronary CT showed moderate blockages. Echocardiogram normal except small PFO. - Repeat echocardiogram in a few weeks. - Monitor palpitations,  consider Apple Watch for heart rate and rhythm tracking. - Continue aspirin 81 mg  daily.  Hyperlipidemia in the setting of moderate coronary artery disease Cholesterol controlled on atorvastatin  20 mg daily, continue. Discussed PCSK9 inhibitors for plaque modification. Considered lipoprotein A as risk marker. - Ordered fasting lipid panel, hemoglobin A1c, and lipoprotein A. - Referred to cholesterol medication clinic for PCSK9 inhibitor evaluation and insurance coverage assessment.  Overweight with concern for elevated blood glucose Overweight with concern for elevated blood glucose. Discussed dietary habits and nighttime eating patterns. - Encouraged dietary modifications, reduce nighttime eating, consider protein-rich snacks. - Ordered hemoglobin A1c to assess for elevated blood glucose.       Soyla Merck, MD, FACC

## 2024-02-25 NOTE — Patient Instructions (Signed)
 Medication Instructions:  No Changes  Lab Work: FASTING Labs at your convenience: Hemoglobin A1C, Lipoprotein A, Lipid Panel  Testing/Procedures: Your physician has requested that you have an echocardiogram. Echocardiography is a painless test that uses sound waves to create images of your heart. It provides your doctor with information about the size and shape of your heart and how well your heart's chambers and valves are working. This procedure takes approximately one hour. There are no restrictions for this procedure. Please do NOT wear cologne, perfume, aftershave, or lotions (deodorant is allowed). Please arrive 15 minutes prior to your appointment time.  Please note: We ask at that you not bring children with you during ultrasound (echo/ vascular) testing. Due to room size and safety concerns, children are not allowed in the ultrasound rooms during exams. Our front office staff cannot provide observation of children in our lobby area while testing is being conducted. An adult accompanying a patient to their appointment will only be allowed in the ultrasound room at the discretion of the ultrasound technician under special circumstances. We apologize for any inconvenience.   Follow-Up: At [facility], you and your health needs are our priority.  As part of our continuing mission to provide you with exceptional heart care, our providers are all part of one team.  This team includes your primary Cardiologist (physician) and Advanced Practice Providers or APPs (Physician Assistants and Nurse Practitioners) who all work together to provide you with the care you need, when you need it.  Your next appointment:   1 year(s)  Provider:   Gayatri A Acharya, MD

## 2024-03-02 LAB — LIPID PANEL
Chol/HDL Ratio: 3.4 ratio (ref 0.0–4.4)
Cholesterol, Total: 133 mg/dL (ref 100–199)
HDL: 39 mg/dL — ABNORMAL LOW (ref >39)
LDL Chol Calc (NIH): 76 mg/dL (ref 0–99)
Triglycerides: 92 mg/dL (ref 0–149)
VLDL Cholesterol Cal: 18 mg/dL (ref 5–40)

## 2024-03-02 LAB — HEMOGLOBIN A1C
Est. average glucose Bld gHb Est-mCnc: 114 mg/dL
Hgb A1c MFr Bld: 5.6 % (ref 4.8–5.6)

## 2024-03-02 LAB — LIPOPROTEIN A (LPA): Lipoprotein (a): 19.8 nmol/L (ref <75.0)

## 2024-03-23 ENCOUNTER — Ambulatory Visit: Admitting: Orthopaedic Surgery

## 2024-03-24 ENCOUNTER — Ambulatory Visit: Payer: Self-pay | Admitting: Internal Medicine

## 2024-04-06 ENCOUNTER — Ambulatory Visit (HOSPITAL_COMMUNITY)
Admission: RE | Admit: 2024-04-06 | Discharge: 2024-04-06 | Disposition: A | Discharge status: Home / Self Care | Source: Ambulatory Visit | Attending: Internal Medicine | Admitting: Internal Medicine

## 2024-04-06 DIAGNOSIS — R0600 Dyspnea, unspecified: Secondary | ICD-10-CM | POA: Diagnosis not present

## 2024-04-06 DIAGNOSIS — R0602 Shortness of breath: Secondary | ICD-10-CM | POA: Diagnosis present

## 2024-04-07 LAB — ECHOCARDIOGRAM COMPLETE
Area-P 1/2: 3.6 cm2
S' Lateral: 2.9 cm

## 2024-04-13 ENCOUNTER — Ambulatory Visit

## 2024-04-21 ENCOUNTER — Ambulatory Visit

## 2024-04-28 ENCOUNTER — Ambulatory Visit

## 2024-04-29 ENCOUNTER — Ambulatory Visit (INDEPENDENT_AMBULATORY_CARE_PROVIDER_SITE_OTHER): Admitting: Sports Medicine

## 2024-04-29 ENCOUNTER — Encounter: Payer: Self-pay | Admitting: Sports Medicine

## 2024-04-29 VITALS — BP 129/75 | HR 68 | Temp 98.1°F | Ht 65.0 in | Wt 192.8 lb

## 2024-04-29 DIAGNOSIS — Z9189 Other specified personal risk factors, not elsewhere classified: Secondary | ICD-10-CM

## 2024-04-29 DIAGNOSIS — R2681 Unsteadiness on feet: Secondary | ICD-10-CM

## 2024-04-29 DIAGNOSIS — J0100 Acute maxillary sinusitis, unspecified: Secondary | ICD-10-CM | POA: Diagnosis not present

## 2024-04-29 DIAGNOSIS — R42 Dizziness and giddiness: Secondary | ICD-10-CM

## 2024-04-29 LAB — CBC WITH DIFFERENTIAL/PLATELET
Basophils Absolute: 0.1 K/uL (ref 0.0–0.1)
Basophils Relative: 1.6 % (ref 0.0–3.0)
Eosinophils Absolute: 0.3 K/uL (ref 0.0–0.7)
Eosinophils Relative: 6.3 % — ABNORMAL HIGH (ref 0.0–5.0)
HCT: 40.4 % (ref 36.0–46.0)
Hemoglobin: 14.1 g/dL (ref 12.0–15.0)
Lymphocytes Relative: 30.4 % (ref 12.0–46.0)
Lymphs Abs: 1.3 K/uL (ref 0.7–4.0)
MCHC: 35 g/dL (ref 30.0–36.0)
MCV: 99.8 fl (ref 78.0–100.0)
Monocytes Absolute: 0.4 K/uL (ref 0.1–1.0)
Monocytes Relative: 8.8 % (ref 3.0–12.0)
Neutro Abs: 2.2 K/uL (ref 1.4–7.7)
Neutrophils Relative %: 52.9 % (ref 43.0–77.0)
Platelets: 221 K/uL (ref 150.0–400.0)
RBC: 4.05 Mil/uL (ref 3.87–5.11)
RDW: 12.8 % (ref 11.5–15.5)
WBC: 4.1 K/uL (ref 4.0–10.5)

## 2024-04-29 LAB — BASIC METABOLIC PANEL WITH GFR
BUN: 13 mg/dL (ref 6–23)
CO2: 30 meq/L (ref 19–32)
Calcium: 8.6 mg/dL (ref 8.4–10.5)
Chloride: 104 meq/L (ref 96–112)
Creatinine, Ser: 0.6 mg/dL (ref 0.40–1.20)
GFR: 91.66 mL/min
Glucose, Bld: 97 mg/dL (ref 70–99)
Potassium: 3.8 meq/L (ref 3.5–5.1)
Sodium: 140 meq/L (ref 135–145)

## 2024-04-29 MED ORDER — AMOXICILLIN-POT CLAVULANATE 500-125 MG PO TABS: 1.0000 | ORAL_TABLET | Freq: Two times a day (BID) | ORAL | 0 refills | Status: AC

## 2024-04-29 NOTE — Progress Notes (Signed)
 "  Careteam: Patient Care Team: Catherine Charlies LABOR, DO as PCP - General (Family Medicine) Loni Soyla LABOR, MD as PCP - Cardiology (Cardiology) Avram Lupita BRAVO, MD as Consulting Physician (Gastroenterology) Dallie Vera GAILS, MD as Consulting Physician (Obstetrics and Gynecology)  Allergies[1]  Chief Complaint  Patient presents with   Dizziness   Nasal Congestion    Pt went to urgent care earlier this week, mentioned she had fluid in ear. Tested for covid/flu (-)    Discussed the use of AI scribe software for clinical note transcription with the patient, who gave verbal consent to proceed.  History of Present Illness  Kayla Rosales is a 69 year old female with a history of vertigo and prior stroke who presents with dizziness, balance issues and sinus pain.  She has been experiencing dizziness and balance issues for the past week and a half. The dizziness is described as a feeling of unsteadiness rather than room spinning, accompanied by blurry vision and a sensation of fullness in the ears, particularly the right ear. The patient reports that a previous provider told her there may have been some fluid or bubbles in her right ear. She has been taking Sudafed and Flonase  daily, and takes allergy medication inconsistently at a low dose, but suspects Sudafed may worsen her symptoms.  She experiences postnasal drip, especially in the morning, and has a dry nose with crackling sounds. No sore throat or ear discharge is reported, but there is occasional morning cough with minimal phlegm. Her symptoms began after a period of poor sleep following the holidays, during which she was only getting about four hours of sleep per night. She initially attributed her symptoms to exhaustion and possibly a viral illness, as she felt hot and very tired.  She feels more winded than usual, attributing it to lack of exercise and fatigue. She has a history of sleep apnea and was told she did not need her CPAP  machine after losing weight. However, she has since regained the weight and experiences symptoms suggestive of sleep apnea, such as waking up with a racing heart and feeling drowsy in the morning. Despite a history of asthma, she has not used her albuterol  or nebulizer recently.  No fever, double vision, loss of vision, slurred speech, sore throat, ear discharge, chest pain, wheezing, or urinary symptoms. Reports dizziness, blurry vision, unsteadiness, fullness in ears, postnasal drip, morning cough, occasional nausea, and feeling winded.    Review of Systems:  Review of Systems  HENT:  Positive for congestion and ear pain (rt ear full ness). Negative for ear discharge, sinus pain and sore throat.   Eyes:  Negative for blurred vision and double vision.  Respiratory:  Positive for cough and sputum production. Negative for shortness of breath and wheezing.   Cardiovascular:  Negative for chest pain and leg swelling.  Gastrointestinal:  Negative for nausea and vomiting.  Musculoskeletal:  Negative for falls.  Neurological:  Positive for dizziness.   Negative unless indicated in HPI.   Patient Active Problem List   Diagnosis Date Noted   BMI 30.0-30.9,adult 05/01/2023   Well woman exam with routine gynecological exam 04/30/2023   Vitamin D  deficiency 01/21/2022   Other tear of medial meniscus, current injury, left knee, subsequent encounter 01/14/2022   Other adverse food reactions, not elsewhere classified, subsequent encounter 03/28/2021   Diastolic dysfunction 08/02/2019   Heart murmur 02/16/2019   Hyperlipidemia LDL goal <100 02/16/2019   Class 1 obesity with serious comorbidity and body mass index (  BMI) of 30.0 to 30.9 in adult 01/04/2019   Pelvic floor dysfunction in female 12/06/2018   Constipation 12/06/2018   Lactose intolerance 12/06/2018   Diverticulosis 05/27/2018   Osteopenia 01/19/2018   Irritable bowel syndrome with constipation 01/17/2018   History of colonic polyp  01/17/2018   History of stroke 01/15/2018   GERD (gastroesophageal reflux disease)    Asthma    S/P cervical spinal fusion 09/29/2017   Macrocytosis without anemia 11/25/2015   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   Heart palpitations 09/08/2015   Vitamin B12 deficiency 04/06/2014   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   Vestibular neuritis 10/15/2006   Family history of malignant neoplasm of breast 03/18/2006   Diaphragmatic hernia 03/18/2006   Premature menopause 03/18/2006   Past Medical History:  Diagnosis Date   Allergy    Arthritis    Asthma    Back pain    Benign positional vertigo    Bilateral swelling of feet    Bronchitis    Chicken pox    Colon polyps    Constipation    Cough 10/09/2020   COVID-19 10/09/2020   Diverticulitis    Early menopause occurring in patient age younger than 45 years    Fibromyalgia    GERD (gastroesophageal reflux disease)    Gluten intolerance    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 Still unclear what is cuasing her pain. May be nerve related. May benefit from starting on gabapentin. Start gabapentin 300 mg TID. F/u 4 weeks   Night sweats    Osteopenia    Palpitations    Recurrent upper respiratory infection (URI)    Right arm weakness 09/28/2015   Last Assessment & Plan:  Formatting of this note might be different from the original. Referral to PT today. 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    Vertigo 08/11/2015   Last Assessment & Plan:  Formatting of this note might be different from the original. Concern given gradual onset over the last week and a half in addition to neck pain.  Patient needs neuroimaging to rule out dissecting vertebral aneurysm. Patient advised to go to emergency room. She declined ambulance transport   Vitamin B 12 deficiency    Vitamin D  deficiency    Past Surgical History:  Procedure Laterality Date   CARPAL TUNNEL RELEASE     [facility] [place] [place] repeat recommended   COLONOSCOPY W/ POLYPECTOMY  05/26/2018   x5 polyps   KNEE ARTHROSCOPY Bilateral 2013   meniscus  x2   NECK SURGERY  2018   disc fusion   UPPER GASTROINTESTINAL ENDOSCOPY     Social History[2] Family History  Problem Relation Age of Onset   Depression Mother    Heart disease Mother    Early death Mother 71   Hypertension Mother    Thyroid  disease Mother    Anxiety disorder Mother    Obesity Mother    Arthritis Father    Heart disease Father    Hypertension Father    Stroke Father    Vision loss Father  Depression Sister 86   Early death Sister    Drug abuse Sister    Lung cancer Sister    Mental illness Sister    COPD Sister    Diabetes Maternal Grandmother    Early death Maternal Grandmother    Early death Maternal Grandfather    Heart disease Maternal Grandfather    Breast cancer Cousin    Barrett's esophagus Son    Colon cancer Neg Hx    Esophageal cancer Neg Hx    Stomach cancer Neg Hx    Pancreatic cancer Neg Hx    Rectal cancer Neg Hx    Allergies[3]  Medications: Patient's Medications  New Prescriptions   AMOXICILLIN -CLAVULANATE (AUGMENTIN ) 500-125 MG TABLET    Take 1 tablet by mouth in the morning and at bedtime.  Previous Medications   ALBUTEROL  (VENTOLIN  HFA) 108 (90 BASE) MCG/ACT INHALER    TAKE 2 PUFFS BY MOUTH EVERY 6 HOURS AS NEEDED FOR WHEEZE OR SHORTNESS OF BREATH   ASPIRIN EC 81 MG TABLET    Take 81 mg by mouth daily.   ATORVASTATIN  (LIPITOR) 20 MG TABLET    Take 1 tablet (20 mg total) by mouth daily.   CETIRIZINE (ZYRTEC) 5 MG TABLET    Take 5 mg by mouth daily as needed for allergies.    CHOLECALCIFEROL 25 MCG (1000 UT) CAPSULE    Take 1,000 Units by mouth daily.   CYANOCOBALAMIN  1000 MCG TABLET    Take 500 mcg by mouth daily.   ESOMEPRAZOLE  (NEXIUM ) 40 MG CAPSULE    Take 1 capsule (40 mg total) by mouth daily at 12 noon.   FLUTICASONE  (FLONASE ) 50 MCG/ACT NASAL SPRAY    Place 2 sprays into both nostrils daily.   GLUCOSAMINE-CHONDROITIN 500-400 MG TABLET    Take 1 tablet by mouth daily.   MAGNESIUM  250 MG TABS    Take 1 tablet by mouth daily.   MULTIPLE VITAMINS-MINERALS (ALIVE WOMENS 50+ PO)    Take by mouth daily.   PROBIOTIC PRODUCT (PROBIOTIC ADVANCED) CAPS    daily.   Modified Medications   No medications on file  Discontinued Medications   PREDNISONE  (STERAPRED UNI-PAK 21 TAB) 10 MG (21) TBPK TABLET    Take by mouth.    Physical Exam: Vitals:   04/29/24 0809  BP: 129/75  Pulse: 68  Temp: 98.1 F (36.7 C)  TempSrc: Oral  SpO2: 96%  Weight: 192 lb 12 oz (87.4 kg)  Height: 5' 5 (1.651 m)   Body mass index is 32.08 kg/m. BP Readings from Last 3 Encounters:  04/29/24 129/75  02/25/24 120/76  02/10/24 114/70   Wt Readings from Last 3 Encounters:  04/29/24 192 lb 12 oz (87.4 kg)  02/25/24 194 lb (88 kg)  02/10/24 189 lb (85.7 kg)    Physical Exam Constitutional:      Appearance: Normal appearance.  HENT:     Head: Normocephalic and atraumatic.     Right Ear: Tympanic membrane normal.     Left Ear: Tympanic membrane normal.     Mouth/Throat:     Pharynx: Posterior oropharyngeal erythema present. No oropharyngeal exudate.     Comments: Bil maxillary sinus tenderness+ Cardiovascular:     Rate and Rhythm: Normal rate and regular rhythm.  Pulmonary:     Effort: Pulmonary effort is normal. No respiratory distress.     Breath sounds: Normal breath sounds. No wheezing.  Abdominal:     General: Bowel sounds are normal. There is no distension.  Tenderness: There is no abdominal tenderness. There is no guarding or rebound.     Comments:     Musculoskeletal:        General: No swelling or tenderness.  Neurological:     Mental Status: She is alert. Mental status is at baseline.     Sensory: No sensory deficit.     Motor: No weakness.     Comments: Gait - unsteady slightly  Rombergs positive Heel to shin neg Finger nose neg Cranial nerve intact  Strength and sensations intact EOMI No nytagmus     Labs reviewed: Basic Metabolic Panel: Recent Labs    06/11/23 0834 02/13/24 1405  NA 141  --   K 4.2  --   CL 104  --   CO2 29  --   GLUCOSE 102*  --   BUN 14  --   CREATININE 0.68 0.70  CALCIUM  9.0  --   TSH 1.39  --    Liver Function Tests: Recent Labs    06/11/23 0834  AST 19  ALT 23  ALKPHOS 88  BILITOT 1.0  PROT 6.5  ALBUMIN 4.2   No results for input(s): LIPASE, AMYLASE in the last 8760 hours. No results for input(s): AMMONIA in the last 8760 hours. CBC: Recent Labs    06/11/23 0834  WBC 5.1  HGB 15.1*  HCT 43.9  MCV 101.9*  PLT 215.0   Lipid Panel: Recent Labs    06/11/23 0834 03/01/24 1002  CHOL 110 133  HDL 35.90* 39*  LDLCALC 48 76  TRIG 132.0 92  CHOLHDL 3 3.4   TSH: Recent Labs    06/11/23 0834  TSH 1.39   A1C: Lab Results  Component Value Date   HGBA1C 5.6 03/01/2024    Assessment & Plan Acute non-recurrent maxillary sinusitis Pt c/o post nasal drip  Maxillary sinus tenderness+ Will start augmentin   Instructed to use flonase , zyrtec Orders:   amoxicillin -clavulanate (AUGMENTIN ) 500-125 MG tablet; Take 1 tablet by mouth in the morning and at bedtime.  Dizziness Pt c/o unsteady gait  Denies headache, nausea, vomiting, double vision, slurring of speech  Neuro exam - rombergs positive Will check labs,  Will get CT Orders:   CBC with Differential/Platelet   Basic Metabolic Panel (BMET)   CT HEAD WO CONTRAST ( ); Future  Unsteady gait Will refer to physical therapy for balance training exercises Orders:   Ambulatory referral to Physical  Therapy  At risk for sleep apnea C/o snoring, day time somnolence Will refer to sleep clinic for sleep study Orders:   Ambulatory referral to Sleep Studies      40 min Total time spent for obtaining history,  performing a medically appropriate examination and evaluation, reviewing the tests,ordering  tests,  documenting clinical information in the electronic or other health record,  ,care coordination (not separately reported)       [1]  Allergies Allergen Reactions   Hydromorphone Other (See Comments)    HYPOTENSION   Meperidine Other (See Comments)    BP drops   Other Other (See Comments)    Pt thinks she has hx of allergic reaction   States she does not know the reaction   Sulfa Antibiotics Other (See Comments)    States she does not know the reaction   [2]  Social History Tobacco Use   Smoking status: Never    Passive exposure: Never   Smokeless tobacco: Never  Vaping Use   Vaping status: Never Used  Substance Use Topics  Alcohol use: Yes    Alcohol/week: 1.0 standard drink of alcohol    Types: 1 Glasses of wine per week   Drug use: Never  [3]  Allergies Allergen Reactions   Hydromorphone Other (See Comments)    HYPOTENSION   Meperidine Other (See Comments)    BP drops   Other Other (See Comments)    Pt thinks she has hx of allergic reaction   States she does not know the reaction   Sulfa Antibiotics Other (See Comments)    States she does not know the reaction    "

## 2024-04-30 ENCOUNTER — Ambulatory Visit: Payer: Self-pay | Admitting: Sports Medicine

## 2024-05-05 ENCOUNTER — Ambulatory Visit (HOSPITAL_BASED_OUTPATIENT_CLINIC_OR_DEPARTMENT_OTHER)
Admission: RE | Admit: 2024-05-05 | Discharge: 2024-05-05 | Disposition: A | Discharge status: Home / Self Care | Source: Ambulatory Visit | Attending: Sports Medicine | Admitting: Sports Medicine

## 2024-05-05 DIAGNOSIS — R42 Dizziness and giddiness: Secondary | ICD-10-CM | POA: Insufficient documentation

## 2024-05-07 ENCOUNTER — Ambulatory Visit: Admitting: Family Medicine

## 2024-05-26 ENCOUNTER — Ambulatory Visit

## 2024-06-11 ENCOUNTER — Encounter: Payer: Medicare Other | Admitting: Family Medicine
